# Patient Record
Sex: Female | Born: 1960 | Race: White | Hispanic: No | Smoking: Former smoker
Health system: Western US, Academic
[De-identification: ages and names within clinical notes are randomized; demographics above are authoritative.]

## PROBLEM LIST (undated history)

## (undated) ENCOUNTER — Ambulatory Visit (HOSPITAL_BASED_OUTPATIENT_CLINIC_OR_DEPARTMENT_OTHER): Admission: RE | Payer: Medicare Other | Admitting: Gastroenterology

## (undated) ENCOUNTER — Encounter (HOSPITAL_BASED_OUTPATIENT_CLINIC_OR_DEPARTMENT_OTHER): Admission: RE | Payer: Self-pay

## (undated) DIAGNOSIS — F17210 Nicotine dependence, cigarettes, uncomplicated: Secondary | ICD-10-CM

## (undated) DIAGNOSIS — E782 Mixed hyperlipidemia: Secondary | ICD-10-CM

## (undated) DIAGNOSIS — M1990 Unspecified osteoarthritis, unspecified site: Secondary | ICD-10-CM

## (undated) DIAGNOSIS — H269 Unspecified cataract: Secondary | ICD-10-CM

## (undated) DIAGNOSIS — J45909 Unspecified asthma, uncomplicated: Secondary | ICD-10-CM

## (undated) DIAGNOSIS — G5601 Carpal tunnel syndrome, right upper limb: Secondary | ICD-10-CM

## (undated) DIAGNOSIS — T7840XA Allergy, unspecified, initial encounter: Secondary | ICD-10-CM

## (undated) DIAGNOSIS — J449 Chronic obstructive pulmonary disease, unspecified: Secondary | ICD-10-CM

## (undated) DIAGNOSIS — J439 Emphysema, unspecified: Secondary | ICD-10-CM

## (undated) DIAGNOSIS — I10 Essential (primary) hypertension: Secondary | ICD-10-CM

## (undated) DIAGNOSIS — M810 Age-related osteoporosis without current pathological fracture: Secondary | ICD-10-CM

## (undated) DIAGNOSIS — F321 Major depressive disorder, single episode, moderate: Secondary | ICD-10-CM

## (undated) DIAGNOSIS — G35 Multiple sclerosis: Secondary | ICD-10-CM

## (undated) DIAGNOSIS — G35D Multiple sclerosis, unspecified: Secondary | ICD-10-CM

## (undated) DIAGNOSIS — E039 Hypothyroidism, unspecified: Secondary | ICD-10-CM

## (undated) DIAGNOSIS — F329 Major depressive disorder, single episode, unspecified: Secondary | ICD-10-CM

## (undated) HISTORY — PX: TONSILLECTOMY AND ADENOIDECTOMY: SHX2683

## (undated) HISTORY — DX: Multiple sclerosis, unspecified: G35.D

## (undated) HISTORY — DX: Major depressive disorder, single episode, unspecified: F32.9

## (undated) HISTORY — PX: LYMPH NODE BIOPSY: SHX201

## (undated) HISTORY — PX: OTHER PROCEDURE: U1053

## (undated) HISTORY — DX: Multiple sclerosis (CMS-HCC): G35

## (undated) HISTORY — DX: Unspecified asthma, uncomplicated: J45.909

## (undated) HISTORY — DX: Hypothyroidism, unspecified: E03.9

## (undated) HISTORY — DX: Age-related osteoporosis without current pathological fracture: M81.0

## (undated) HISTORY — DX: Unspecified cataract: H26.9

## (undated) HISTORY — DX: Nicotine dependence, cigarettes, uncomplicated: F17.210

## (undated) HISTORY — DX: Essential (primary) hypertension: I10

## (undated) HISTORY — DX: Unspecified osteoarthritis, unspecified site: M19.90

## (undated) HISTORY — DX: Carpal tunnel syndrome, right upper limb: G56.01

## (undated) HISTORY — DX: Emphysema, unspecified: J43.9

## (undated) HISTORY — PX: ABDOMINAL HYSTERECTOMY: SHX81

## (undated) HISTORY — DX: Chronic obstructive pulmonary disease, unspecified: J44.9

## (undated) HISTORY — PX: CHOLECYSTECTOMY: SHX55

## (undated) HISTORY — DX: Major depressive disorder, single episode, moderate: F32.1

## (undated) HISTORY — DX: Mixed hyperlipidemia: E78.2

## (undated) HISTORY — DX: Allergy, unspecified, initial encounter: T78.40XA

## (undated) SURGERY — COLONOSCOPY
Anesthesia: Monitored Anesthesia Care (MAC)

## (undated) MED ORDER — SODIUM CHLORIDE 0.9 % IV SOLN
500.00 mg | Freq: Once | INTRAVENOUS | Status: AC
Start: 2018-01-02 — End: ?

## (undated) MED ORDER — MEPERIDINE HCL 25 MG/ML IJ SOLN
25.00 mg | Freq: Once | INTRAMUSCULAR | Status: AC | PRN
Start: 2020-05-21 — End: ?

## (undated) MED ORDER — HYDROCORTISONE SOD SUCCINATE 100 MG IJ SOLR
100.00 mg | Freq: Once | INTRAMUSCULAR | Status: AC | PRN
Start: 2017-03-16 — End: 2017-03-10

## (undated) MED ORDER — METHYLPREDNISOLONE SODIUM SUCC 125 MG IJ SOLR
100.00 mg | Freq: Once | INTRAMUSCULAR | Status: AC
Start: 2018-01-16 — End: ?

## (undated) MED ORDER — EPINEPHRINE 0.3 MG/0.3ML IJ SOAJ
0.30 mg | INTRAMUSCULAR | Status: AC | PRN
Start: 2016-01-02 — End: 2015-12-15

## (undated) MED ORDER — HYDROCORTISONE SOD SUCCINATE 100 MG IJ SOLR
100.00 mg | Freq: Once | INTRAMUSCULAR | Status: AC | PRN
Start: 2016-06-22 — End: 2016-06-24

## (undated) MED ORDER — EPINEPHRINE 0.3 MG/0.3ML IJ SOAJ
0.30 mg | INTRAMUSCULAR | Status: AC | PRN
Start: 2016-08-23 — End: 2016-08-23

## (undated) MED ORDER — ARMOUR THYROID 15 MG OR TABS
ORAL_TABLET | ORAL | 0 refills | Status: AC
Start: 2018-10-07 — End: ?

## (undated) MED ORDER — METHYLPREDNISOLONE SODIUM SUCC 125 MG IJ SOLR
100.00 mg | Freq: Once | INTRAMUSCULAR | Status: AC
Start: 2020-05-21 — End: 2020-04-22

## (undated) MED ORDER — DIPHENHYDRAMINE HCL 50 MG/ML IJ SOLN
25.00 mg | Freq: Once | INTRAMUSCULAR | Status: AC | PRN
Start: 2016-03-01 — End: 2016-02-09

## (undated) MED ORDER — ALBUTEROL SULFATE (5 MG/ML) 0.5% IN NEBU
2.50 mg | INHALATION_SOLUTION | Freq: Once | RESPIRATORY_TRACT | Status: AC | PRN
Start: 2016-01-30 — End: 2016-01-12

## (undated) MED ORDER — SODIUM CHLORIDE 0.9 % IV SOLN
INTRAVENOUS | Status: AC
Start: 2016-08-23 — End: 2016-08-23

## (undated) MED ORDER — SODIUM CHLORIDE 0.9 % IV SOLN
INTRAVENOUS | Status: AC
Start: 2016-01-30 — End: 2016-01-12

## (undated) MED ORDER — HYDROCORTISONE SOD SUCCINATE 100 MG IJ SOLR
100.00 mg | Freq: Once | INTRAMUSCULAR | Status: AC | PRN
Start: 2018-01-16 — End: ?

## (undated) MED ORDER — METHYLPREDNISOLONE SODIUM SUCC 125 MG IJ SOLR
100.00 mg | Freq: Once | INTRAMUSCULAR | Status: AC
Start: 2018-09-18 — End: ?

## (undated) MED ORDER — DIPHENHYDRAMINE HCL 50 MG/ML IJ SOLN
25.00 mg | Freq: Once | INTRAMUSCULAR | Status: AC | PRN
Start: 2018-01-16 — End: ?

## (undated) MED ORDER — ACETAMINOPHEN 325 MG PO TABS
650.00 mg | ORAL_TABLET | Freq: Once | ORAL | Status: AC
Start: 2017-07-15 — End: 2017-07-15

## (undated) MED ORDER — ALBUTEROL SULFATE (5 MG/ML) 0.5% IN NEBU
2.50 mg | INHALATION_SOLUTION | Freq: Once | RESPIRATORY_TRACT | Status: AC | PRN
Start: 2016-10-29 — End: 2016-10-18

## (undated) MED ORDER — SODIUM CHLORIDE 0.9 % IV SOLN
1000.0000 mg | Freq: Once | INTRAVENOUS | Status: AC
Start: 2020-12-12 — End: 2020-12-16

## (undated) MED ORDER — HYDROCORTISONE SOD SUCCINATE 100 MG IJ SOLR
100.00 mg | Freq: Once | INTRAMUSCULAR | Status: AC | PRN
Start: 2017-02-10 — End: 2017-01-21

## (undated) MED ORDER — NATALIZUMAB 300 MG/15ML IV CONC
300.00 mg | Freq: Once | INTRAVENOUS | Status: AC
Start: 2017-02-10 — End: 2017-01-21

## (undated) MED ORDER — DIPHENHYDRAMINE HCL 50 MG/ML IJ SOLN
25.00 mg | Freq: Once | INTRAMUSCULAR | Status: AC | PRN
Start: 2016-07-25 — End: 2016-07-25

## (undated) MED ORDER — EPINEPHRINE 0.3 MG/0.3ML IJ SOAJ
0.30 mg | INTRAMUSCULAR | Status: AC | PRN
Start: 2017-04-29 — End: 2017-04-07

## (undated) MED ORDER — ALBUTEROL SULFATE (5 MG/ML) 0.5% IN NEBU
2.50 mg | INHALATION_SOLUTION | Freq: Once | RESPIRATORY_TRACT | Status: AC | PRN
Start: 2016-03-01 — End: 2016-02-09

## (undated) MED ORDER — SODIUM CHLORIDE 0.9 % IV SOLN
INTRAVENOUS | Status: AC
Start: 2016-03-01 — End: 2016-02-09

## (undated) MED ORDER — ACETAMINOPHEN 325 MG PO TABS
650.0000 mg | ORAL_TABLET | Freq: Once | ORAL | Status: AC
Start: 2019-08-10 — End: 2019-08-10

## (undated) MED ORDER — SODIUM CHLORIDE 0.9 % IV SOLN
INTRAVENOUS | Status: AC
Start: 2016-07-20 — End: 2016-07-20

## (undated) MED ORDER — LACTATED RINGERS IV SOLN
INTRAVENOUS | Status: AC
Start: 2020-10-15 — End: ?

## (undated) MED ORDER — LIOTHYRONINE SODIUM 25 MCG OR TABS
ORAL_TABLET | ORAL | 0 refills | Status: AC
Start: 2020-04-29 — End: ?

## (undated) MED ORDER — MEPERIDINE HCL 25 MG/ML IJ SOLN
25.00 mg | Freq: Once | INTRAMUSCULAR | Status: AC | PRN
Start: 2018-01-02 — End: ?

## (undated) MED ORDER — SODIUM CHLORIDE 0.9 % IV SOLN
INTRAVENOUS | Status: AC
Start: 2016-10-01 — End: 2016-09-20

## (undated) MED ORDER — HYDROCORTISONE SOD SUCCINATE 100 MG IJ SOLR
100.00 mg | Freq: Once | INTRAMUSCULAR | Status: AC | PRN
Start: 2021-06-30 — End: 2021-06-29

## (undated) MED ORDER — DIPHENHYDRAMINE HCL 50 MG/ML IJ SOLN
25.00 mg | Freq: Once | INTRAMUSCULAR | Status: AC | PRN
Start: 2016-10-29 — End: 2016-10-18

## (undated) MED ORDER — RITUXIMAB 500 MG/50ML IV SOLN
1000.00 mg | Freq: Once | INTRAVENOUS | Status: AC
Start: 2018-09-18 — End: ?

## (undated) MED ORDER — EPINEPHRINE 0.3 MG/0.3ML IJ SOAJ
0.30 mg | INTRAMUSCULAR | Status: AC | PRN
Start: 2016-07-20 — End: 2016-07-20

## (undated) MED ORDER — DIPHENHYDRAMINE HCL 50 MG/ML IJ SOLN
25.00 mg | Freq: Once | INTRAMUSCULAR | Status: AC
Start: 2018-09-18 — End: ?

## (undated) MED ORDER — SODIUM CHLORIDE 0.9 % IV SOLN
INTRAVENOUS | Status: AC
Start: 2017-07-15 — End: 2017-07-15

## (undated) MED ORDER — ARMOUR THYROID 15 MG OR TABS
ORAL_TABLET | ORAL | 1 refills | Status: AC
Start: 2017-07-21 — End: ?

## (undated) MED ORDER — EPINEPHRINE 0.3 MG/0.3ML IJ SOAJ
0.30 mg | INTRAMUSCULAR | Status: AC | PRN
Start: 2017-05-25 — End: 2017-05-05

## (undated) MED ORDER — SODIUM CHLORIDE 0.9 % IV SOLN
INTRAVENOUS | Status: AC
Start: 2017-02-10 — End: 2017-01-21

## (undated) MED ORDER — EPINEPHRINE 0.3 MG/0.3ML IJ SOAJ
0.30 mg | INTRAMUSCULAR | Status: AC | PRN
Start: 2016-05-27 — End: 2016-05-03

## (undated) MED ORDER — NATALIZUMAB 300 MG/15ML IV CONC
300.00 mg | Freq: Once | INTRAVENOUS | Status: AC
Start: 2016-10-01 — End: 2016-09-21

## (undated) MED ORDER — DIPHENHYDRAMINE HCL 50 MG/ML IJ SOLN
25.00 mg | Freq: Once | INTRAMUSCULAR | Status: AC
Start: 2018-01-02 — End: ?

## (undated) MED ORDER — PRAZOSIN HCL 1 MG OR CAPS
1.0000 mg | ORAL_CAPSULE | Freq: Every evening | ORAL | 0 refills | Status: AC
Start: 2021-06-01 — End: ?

## (undated) MED ORDER — HYDROCORTISONE SOD SUCCINATE 100 MG IJ SOLR
100.00 mg | Freq: Once | INTRAMUSCULAR | Status: AC | PRN
Start: 2016-01-30 — End: 2016-01-12

## (undated) MED ORDER — ALBUTEROL SULFATE (5 MG/ML) 0.5% IN NEBU
2.50 mg | INHALATION_SOLUTION | Freq: Once | RESPIRATORY_TRACT | Status: AC | PRN
Start: 2018-01-16 — End: ?

## (undated) MED ORDER — MEPERIDINE HCL 25 MG/ML IJ SOLN
25.00 mg | Freq: Once | INTRAMUSCULAR | Status: AC | PRN
Start: 2017-07-01 — End: ?

## (undated) MED ORDER — ALBUTEROL SULFATE (5 MG/ML) 0.5% IN NEBU
2.50 mg | INHALATION_SOLUTION | Freq: Once | RESPIRATORY_TRACT | Status: AC | PRN
Start: 2019-08-10 — End: 2019-08-10

## (undated) MED ORDER — ALBUTEROL SULFATE (5 MG/ML) 0.5% IN NEBU
2.5000 mg | INHALATION_SOLUTION | Freq: Once | RESPIRATORY_TRACT | Status: AC | PRN
Start: 2020-12-12 — End: 2020-12-15

## (undated) MED ORDER — NATALIZUMAB 300 MG/15ML IV CONC
300.00 mg | Freq: Once | INTRAVENOUS | Status: AC
Start: 2016-12-24 — End: 2016-12-25

## (undated) MED ORDER — SODIUM CHLORIDE 0.9 % IV SOLN
INTRAVENOUS | Status: AC
Start: 2016-06-22 — End: 2016-06-24

## (undated) MED ORDER — EPINEPHRINE 0.3 MG/0.3ML IJ SOAJ
0.30 mg | INTRAMUSCULAR | Status: AC | PRN
Start: 2016-12-24 — End: 2016-12-24

## (undated) MED ORDER — NORTRIPTYLINE HCL 25 MG OR CAPS
50.0000 mg | ORAL_CAPSULE | Freq: Every evening | ORAL | 3 refills | Status: AC
Start: 2021-06-10 — End: ?

## (undated) MED ORDER — ALBUTEROL SULFATE (5 MG/ML) 0.5% IN NEBU
2.50 mg | INHALATION_SOLUTION | Freq: Once | RESPIRATORY_TRACT | Status: AC | PRN
Start: 2016-05-27 — End: 2016-05-03

## (undated) MED ORDER — ALBUTEROL SULFATE (5 MG/ML) 0.5% IN NEBU
2.50 mg | INHALATION_SOLUTION | Freq: Once | RESPIRATORY_TRACT | Status: AC | PRN
Start: 2021-06-30 — End: 2021-06-29

## (undated) MED ORDER — HYDROCORTISONE SOD SUCCINATE 100 MG IJ SOLR
100.00 mg | Freq: Once | INTRAMUSCULAR | Status: AC | PRN
Start: 2016-03-26 — End: 2016-03-08

## (undated) MED ORDER — SODIUM CHLORIDE 0.9 % IV SOLN
INTRAVENOUS | Status: AC
Start: 2019-08-10 — End: 2019-08-10

## (undated) MED ORDER — SODIUM CHLORIDE 0.9 % IV SOLN
INTRAVENOUS | Status: AC
Start: 2016-12-24 — End: ?

## (undated) MED ORDER — ACETAMINOPHEN 325 MG PO TABS
325.00 mg | ORAL_TABLET | Freq: Once | ORAL | Status: AC
Start: 2018-01-02 — End: ?

## (undated) MED ORDER — HYDROCORTISONE SOD SUCCINATE 100 MG IJ SOLR
100.00 mg | Freq: Once | INTRAMUSCULAR | Status: AC | PRN
Start: 2017-07-15 — End: 2017-07-15

## (undated) MED ORDER — METHYLPREDNISOLONE SODIUM SUCC 125 MG IJ SOLR
100.00 mg | Freq: Once | INTRAMUSCULAR | Status: AC
Start: 2017-07-01 — End: 2017-07-01

## (undated) MED ORDER — HYDROCORTISONE SOD SUCCINATE 100 MG IJ SOLR (CUSTOM)
100.00 mg | Freq: Once | INTRAMUSCULAR | Status: AC | PRN
Start: 2017-08-23 — End: 2017-08-17

## (undated) MED ORDER — HYDROCORTISONE SOD SUCCINATE 100 MG IJ SOLR
100.00 mg | Freq: Once | INTRAMUSCULAR | Status: AC | PRN
Start: 2016-11-26 — End: 2016-11-26

## (undated) MED ORDER — ARMOUR THYROID 60 MG OR TABS
ORAL_TABLET | ORAL | 1 refills | Status: AC
Start: 2017-07-21 — End: ?

## (undated) MED ORDER — SODIUM CHLORIDE 0.9 % IV SOLN
500.00 mg | Freq: Once | INTRAVENOUS | Status: AC
Start: 2017-07-15 — End: 2017-07-15

## (undated) MED ORDER — NATALIZUMAB 300 MG/15ML IV CONC
300.00 mg | Freq: Once | INTRAVENOUS | Status: AC
Start: 2017-03-16 — End: 2017-03-11

## (undated) MED ORDER — ACETAMINOPHEN 325 MG PO TABS
650.00 mg | ORAL_TABLET | Freq: Once | ORAL | Status: AC
Start: 2021-06-30 — End: 2021-06-29

## (undated) MED ORDER — DIPHENHYDRAMINE HCL 50 MG/ML IJ SOLN
25.00 mg | Freq: Once | INTRAMUSCULAR | Status: AC | PRN
Start: 2016-12-24 — End: 2016-12-24

## (undated) MED ORDER — DIPHENHYDRAMINE HCL 50 MG/ML IJ SOLN
25.00 mg | Freq: Once | INTRAMUSCULAR | Status: AC | PRN
Start: 2016-07-20 — End: 2016-07-20

## (undated) MED ORDER — MEPERIDINE HCL 25 MG/ML IJ SOLN
25.00 mg | Freq: Once | INTRAMUSCULAR | Status: AC | PRN
Start: 2019-08-10 — End: ?

## (undated) MED ORDER — EPINEPHRINE 0.3 MG/0.3ML IJ SOAJ
.30 mg | INTRAMUSCULAR | Status: AC | PRN
Start: 2017-07-15 — End: 2017-07-15

## (undated) MED ORDER — SODIUM CHLORIDE 0.9 % IV SOLN
INTRAVENOUS | Status: AC
Start: 2017-03-16 — End: 2017-03-10

## (undated) MED ORDER — DIPHENHYDRAMINE HCL 50 MG/ML IJ SOLN
25.00 mg | Freq: Once | INTRAMUSCULAR | Status: AC | PRN
Start: 2017-08-23 — End: 2017-08-17

## (undated) MED ORDER — HYDROCORTISONE SOD SUCCINATE 100 MG IJ SOLR
100.00 mg | Freq: Once | INTRAMUSCULAR | Status: AC | PRN
Start: 2016-07-20 — End: 2016-07-20

## (undated) MED ORDER — HYDROCORTISONE SOD SUCCINATE 100 MG IJ SOLR
100.00 mg | Freq: Once | INTRAMUSCULAR | Status: AC | PRN
Start: 2016-12-24 — End: 2016-12-24

## (undated) MED ORDER — METHYLPREDNISOLONE SODIUM SUCC 125 MG IJ SOLR
100.00 mg | Freq: Once | INTRAMUSCULAR | Status: AC
Start: 2021-06-30 — End: 2021-06-29

## (undated) MED ORDER — HYDROCORTISONE SOD SUCCINATE 100 MG IJ SOLR
100.00 mg | Freq: Once | INTRAMUSCULAR | Status: AC | PRN
Start: 2017-07-01 — End: 2017-07-01

## (undated) MED ORDER — ALBUTEROL SULFATE (5 MG/ML) 0.5% IN NEBU
2.50 mg | INHALATION_SOLUTION | Freq: Once | RESPIRATORY_TRACT | Status: AC | PRN
Start: 2016-06-22 — End: 2016-06-24

## (undated) MED ORDER — EPINEPHRINE 0.3 MG/0.3ML IJ SOAJ
0.30 mg | INTRAMUSCULAR | Status: AC | PRN
Start: 2017-06-28 — End: 2017-06-22

## (undated) MED ORDER — DIPHENHYDRAMINE HCL 50 MG/ML IJ SOLN
25.00 mg | Freq: Once | INTRAMUSCULAR | Status: AC | PRN
Start: 2016-03-26 — End: 2016-03-08

## (undated) MED ORDER — EPINEPHRINE 0.3 MG/0.3ML IJ SOAJ
0.30 mg | INTRAMUSCULAR | Status: AC | PRN
Start: 2018-01-16 — End: ?

## (undated) MED ORDER — ACETAMINOPHEN 325 MG PO TABS
650.0000 mg | ORAL_TABLET | Freq: Once | ORAL | Status: AC
Start: 2020-12-12 — End: 2020-12-16

## (undated) MED ORDER — HYDROCORTISONE SOD SUCCINATE 100 MG IJ SOLR
100.00 mg | Freq: Once | INTRAMUSCULAR | Status: AC | PRN
Start: 2018-09-18 — End: ?

## (undated) MED ORDER — ALBUTEROL SULFATE (5 MG/ML) 0.5% IN NEBU
2.50 mg | INHALATION_SOLUTION | Freq: Once | RESPIRATORY_TRACT | Status: AC | PRN
Start: 2016-10-01 — End: 2016-09-20

## (undated) MED ORDER — DIPHENHYDRAMINE HCL 50 MG/ML IJ SOLN
25.00 mg | Freq: Once | INTRAMUSCULAR | Status: AC
Start: 2017-07-15 — End: 2017-07-15

## (undated) MED ORDER — DIPHENHYDRAMINE HCL 50 MG/ML IJ SOLN
25.00 mg | Freq: Once | INTRAMUSCULAR | Status: AC | PRN
Start: 2016-10-01 — End: 2016-09-20

## (undated) MED ORDER — DIAZEPAM 5 MG OR TABS
5.00 mg | ORAL_TABLET | ORAL | Status: AC
Start: 2017-01-24 — End: ?

## (undated) MED ORDER — DIPHENHYDRAMINE HCL 50 MG/ML IJ SOLN
25.00 mg | Freq: Once | INTRAMUSCULAR | Status: AC | PRN
Start: 2019-08-10 — End: 2019-08-10

## (undated) MED ORDER — HYDROCORTISONE SOD SUCCINATE 100 MG IJ SOLR
100.00 mg | Freq: Once | INTRAMUSCULAR | Status: AC | PRN
Start: 2016-07-25 — End: 2016-07-25

## (undated) MED ORDER — EPINEPHRINE 0.3 MG/0.3ML IJ SOAJ
0.30 mg | INTRAMUSCULAR | Status: AC | PRN
Start: 2019-08-10 — End: 2019-08-10

## (undated) MED ORDER — METHYLPREDNISOLONE SODIUM SUCC 125 MG IJ SOLR CUSTOM
100.0000 mg | Freq: Once | INTRAMUSCULAR | Status: AC
Start: 2020-12-12 — End: 2020-12-16

## (undated) MED ORDER — SODIUM CHLORIDE 0.9 % IV SOLN
INTRAVENOUS | Status: AC
Start: 2020-05-21 — End: 2020-04-21

## (undated) MED ORDER — DIPHENHYDRAMINE HCL 50 MG/ML IJ SOLN
25.00 mg | Freq: Once | INTRAMUSCULAR | Status: AC | PRN
Start: 2017-04-29 — End: 2017-04-07

## (undated) MED ORDER — SODIUM CHLORIDE 0.9 % IV SOLN
300.00 mg | Freq: Once | INTRAVENOUS | Status: AC
Start: 2016-01-02 — End: 2015-12-15

## (undated) MED ORDER — EPINEPHRINE 0.3 MG/0.3ML IJ SOAJ
0.30 mg | INTRAMUSCULAR | Status: AC | PRN
Start: 2018-09-18 — End: ?

## (undated) MED ORDER — DIPHENHYDRAMINE HCL 50 MG/ML IJ SOLN
25.00 mg | Freq: Once | INTRAMUSCULAR | Status: AC | PRN
Start: 2017-07-15 — End: 2017-07-15

## (undated) MED ORDER — EPINEPHRINE 0.3 MG/0.3ML IJ SOAJ
0.30 mg | INTRAMUSCULAR | Status: AC | PRN
Start: 2020-05-21 — End: 2020-04-21

## (undated) MED ORDER — SODIUM CHLORIDE 0.9 % IV SOLN
300.00 mg | Freq: Once | INTRAVENOUS | Status: AC
Start: 2017-06-28 — End: 2017-06-23

## (undated) MED ORDER — SODIUM CHLORIDE 0.9 % IV SOLN
300.00 mg | Freq: Once | INTRAVENOUS | Status: AC
Start: 2017-05-25 — End: 2017-05-06

## (undated) MED ORDER — SODIUM CHLORIDE 0.9 % IV SOLN
INTRAVENOUS | Status: AC
Start: 2016-11-26 — End: ?

## (undated) MED ORDER — ACETAMINOPHEN 325 MG PO TABS
325.00 mg | ORAL_TABLET | Freq: Once | ORAL | Status: AC
Start: 2018-09-18 — End: ?

## (undated) MED ORDER — DIPHENHYDRAMINE HCL 50 MG/ML IJ SOLN
25.00 mg | Freq: Once | INTRAMUSCULAR | Status: AC
Start: 2021-06-30 — End: 2021-06-29

## (undated) MED ORDER — BUPROPION XL (DAILY) 300 MG OR TB24
300.0000 mg | ORAL_TABLET | Freq: Every morning | ORAL | 1 refills | Status: AC
Start: 2020-12-13 — End: ?

## (undated) MED ORDER — EPINEPHRINE 0.3 MG/0.3ML IJ SOAJ
0.30 mg | INTRAMUSCULAR | Status: AC | PRN
Start: 2016-01-30 — End: 2016-01-12

## (undated) MED ORDER — ACETAMINOPHEN 325 MG PO TABS
325.00 mg | ORAL_TABLET | Freq: Once | ORAL | Status: AC | PRN
Start: 2021-06-30 — End: ?

## (undated) MED ORDER — HYDROCORTISONE SOD SUCCINATE 100 MG IJ SOLR
100.00 mg | Freq: Once | INTRAMUSCULAR | Status: AC | PRN
Start: 2016-10-01 — End: 2016-09-20

## (undated) MED ORDER — DIPHENHYDRAMINE HCL 50 MG/ML IJ SOLN
25.00 mg | Freq: Once | INTRAMUSCULAR | Status: AC | PRN
Start: 2016-01-30 — End: 2016-01-12

## (undated) MED ORDER — THYROID 15 MG OR TABS
15.0000 mg | ORAL_TABLET | ORAL | 2 refills | Status: AC
Start: 2021-03-09 — End: ?

## (undated) MED ORDER — METHYLPREDNISOLONE SODIUM SUCC 125 MG IJ SOLR
100.00 mg | Freq: Once | INTRAMUSCULAR | Status: AC
Start: 2017-07-15 — End: 2017-07-15

## (undated) MED ORDER — ACETAMINOPHEN 325 MG PO TABS
325.00 mg | ORAL_TABLET | Freq: Once | ORAL | Status: AC | PRN
Start: 2018-09-18 — End: ?

## (undated) MED ORDER — ALBUTEROL SULFATE (5 MG/ML) 0.5% IN NEBU
2.50 mg | INHALATION_SOLUTION | Freq: Once | RESPIRATORY_TRACT | Status: AC | PRN
Start: 2016-12-24 — End: 2016-12-24

## (undated) MED ORDER — SODIUM CHLORIDE 0.9 % IV SOLN
INTRAVENOUS | Status: AC
Start: 2020-12-12 — End: 2020-12-16

## (undated) MED ORDER — SODIUM CHLORIDE 0.9 % IV SOLN
INTRAVENOUS | Status: AC
Start: 2017-06-28 — End: ?

## (undated) MED ORDER — ALBUTEROL SULFATE (5 MG/ML) 0.5% IN NEBU
2.50 mg | INHALATION_SOLUTION | Freq: Once | RESPIRATORY_TRACT | Status: AC | PRN
Start: 2016-04-23 — End: 2016-04-05

## (undated) MED ORDER — BACLOFEN 10 MG OR TABS
10.00 mg | ORAL_TABLET | Freq: Every day | ORAL | 0 refills | Status: AC
Start: 2017-08-22 — End: ?

## (undated) MED ORDER — SODIUM CHLORIDE 0.9 % IV SOLN
INTRAVENOUS | Status: AC
Start: 2016-07-25 — End: 2016-07-25

## (undated) MED ORDER — MEPERIDINE HCL 25 MG/ML IJ SOLN
25.00 mg | Freq: Once | INTRAMUSCULAR | Status: AC | PRN
Start: 2017-07-15 — End: ?

## (undated) MED ORDER — DIPHENHYDRAMINE HCL 50 MG/ML IJ SOLN
25.00 mg | Freq: Once | INTRAMUSCULAR | Status: AC | PRN
Start: 2020-05-21 — End: 2020-04-21

## (undated) MED ORDER — SODIUM CHLORIDE 0.9 % IV SOLN
INTRAVENOUS | Status: AC
Start: 2016-03-26 — End: 2016-03-08

## (undated) MED ORDER — HYDROCORTISONE SOD SUCCINATE 100 MG IJ SOLR
100.00 mg | Freq: Once | INTRAMUSCULAR | Status: AC | PRN
Start: 2017-04-29 — End: 2017-04-07

## (undated) MED ORDER — EPINEPHRINE 0.3 MG/0.3ML IJ SOAJ
0.30 mg | INTRAMUSCULAR | Status: AC | PRN
Start: 2016-10-01 — End: 2016-09-20

## (undated) MED ORDER — ALBUTEROL SULFATE (5 MG/ML) 0.5% IN NEBU
2.50 mg | INHALATION_SOLUTION | Freq: Once | RESPIRATORY_TRACT | Status: AC | PRN
Start: 2017-07-15 — End: 2017-07-15

## (undated) MED ORDER — ALBUTEROL SULFATE (5 MG/ML) 0.5% IN NEBU
2.50 mg | INHALATION_SOLUTION | Freq: Once | RESPIRATORY_TRACT | Status: AC | PRN
Start: 2017-05-25 — End: 2017-05-05

## (undated) MED ORDER — DIPHENHYDRAMINE HCL 50 MG/ML IJ SOLN
25.00 mg | Freq: Once | INTRAMUSCULAR | Status: AC | PRN
Start: 2018-09-18 — End: ?

## (undated) MED ORDER — SODIUM CHLORIDE 0.9 % IV SOLN
INTRAVENOUS | Status: AC
Start: 2017-05-25 — End: ?

## (undated) MED ORDER — DIPHENHYDRAMINE HCL 50 MG/ML IJ SOLN
25.00 mg | Freq: Once | INTRAMUSCULAR | Status: AC | PRN
Start: 2016-06-22 — End: 2016-06-24

## (undated) MED ORDER — MEPERIDINE HCL 25 MG/ML IJ SOLN
25.0000 mg | Freq: Once | INTRAMUSCULAR | Status: AC | PRN
Start: 2020-12-12 — End: ?

## (undated) MED ORDER — TIZANIDINE HCL 4 MG OR TABS
4.0000 mg | ORAL_TABLET | Freq: Every evening | ORAL | 1 refills | Status: AC
Start: 2021-03-13 — End: ?

## (undated) MED ORDER — SODIUM CHLORIDE 0.9 % IV SOLN
INTRAVENOUS | Status: AC
Start: 2017-05-25 — End: 2017-05-05

## (undated) MED ORDER — NATALIZUMAB 300 MG/15ML IV CONC
300.00 mg | Freq: Once | INTRAVENOUS | Status: AC
Start: 2016-01-30 — End: 2016-01-12

## (undated) MED ORDER — EPINEPHRINE 0.3 MG/0.3ML IJ SOAJ
0.30 mg | INTRAMUSCULAR | Status: AC | PRN
Start: 2017-07-26 — End: 2017-07-20

## (undated) MED ORDER — SODIUM CHLORIDE 0.9 % IV SOLN
INTRAVENOUS | Status: AC
Start: 2016-12-24 — End: 2016-12-24

## (undated) MED ORDER — METHYLPREDNISOLONE SODIUM SUCC 125 MG IJ SOLR
100.00 mg | Freq: Once | INTRAMUSCULAR | Status: AC
Start: 2018-01-02 — End: ?

## (undated) MED ORDER — ALBUTEROL SULFATE (5 MG/ML) 0.5% IN NEBU
2.50 mg | INHALATION_SOLUTION | Freq: Once | RESPIRATORY_TRACT | Status: AC | PRN
Start: 2016-01-02 — End: 2015-12-15

## (undated) MED ORDER — HYDROCORTISONE SOD SUCCINATE 100 MG IJ SOLR
100.00 mg | Freq: Once | INTRAMUSCULAR | Status: AC | PRN
Start: 2016-05-27 — End: 2016-05-03

## (undated) MED ORDER — ALBUTEROL SULFATE (5 MG/ML) 0.5% IN NEBU
2.50 mg | INHALATION_SOLUTION | Freq: Once | RESPIRATORY_TRACT | Status: AC | PRN
Start: 2016-07-20 — End: 2016-07-20

## (undated) MED ORDER — ACETAMINOPHEN 325 MG PO TABS
325.0000 mg | ORAL_TABLET | Freq: Once | ORAL | Status: AC | PRN
Start: 2019-08-10 — End: ?

## (undated) MED ORDER — DIPHENHYDRAMINE HCL 50 MG/ML IJ SOLN
25.00 mg | Freq: Once | INTRAMUSCULAR | Status: AC | PRN
Start: 2016-11-26 — End: 2016-11-26

## (undated) MED ORDER — DIPHENHYDRAMINE HCL 50 MG/ML IJ SOLN
25.00 mg | Freq: Once | INTRAMUSCULAR | Status: AC | PRN
Start: 2016-05-27 — End: 2016-05-03

## (undated) MED ORDER — DIPHENHYDRAMINE HCL 50 MG/ML IJ SOLN
25.00 mg | Freq: Once | INTRAMUSCULAR | Status: AC | PRN
Start: 2016-04-23 — End: 2016-04-05

## (undated) MED ORDER — EPINEPHRINE 0.3 MG/0.3ML IJ SOAJ
0.30 mg | INTRAMUSCULAR | Status: AC | PRN
Start: 2016-07-25 — End: 2016-07-25

## (undated) MED ORDER — NATALIZUMAB 300 MG/15ML IV CONC
300.00 mg | Freq: Once | INTRAVENOUS | Status: AC
Start: 2016-06-22 — End: 2016-06-25

## (undated) MED ORDER — EPINEPHRINE 0.3 MG/0.3ML IJ SOAJ
0.30 mg | INTRAMUSCULAR | Status: AC | PRN
Start: 2016-11-26 — End: 2016-11-26

## (undated) MED ORDER — ALBUTEROL SULFATE (5 MG/ML) 0.5% IN NEBU
2.50 mg | INHALATION_SOLUTION | Freq: Once | RESPIRATORY_TRACT | Status: AC | PRN
Start: 2017-07-01 — End: 2017-07-01

## (undated) MED ORDER — BUPROPION XL (DAILY) 300 MG OR TB24
300.0000 mg | ORAL_TABLET | Freq: Every morning | ORAL | 1 refills | Status: AC
Start: 2021-01-05 — End: ?

## (undated) MED ORDER — EPINEPHRINE 0.3 MG/0.3ML IJ SOAJ
0.30 mg | INTRAMUSCULAR | Status: AC | PRN
Start: 2016-04-23 — End: 2016-04-05

## (undated) MED ORDER — ALBUTEROL SULFATE (5 MG/ML) 0.5% IN NEBU
2.50 mg | INHALATION_SOLUTION | Freq: Once | RESPIRATORY_TRACT | Status: AC | PRN
Start: 2017-02-10 — End: 2017-01-21

## (undated) MED ORDER — HYDROCORTISONE SOD SUCCINATE 100 MG IJ SOLR
100.00 mg | Freq: Once | INTRAMUSCULAR | Status: AC | PRN
Start: 2016-04-23 — End: 2016-04-05

## (undated) MED ORDER — BACLOFEN 10 MG OR TABS
10.00 mg | ORAL_TABLET | Freq: Every day | ORAL | 0 refills | Status: AC
Start: 2017-01-19 — End: ?

## (undated) MED ORDER — DIPHENHYDRAMINE HCL 50 MG/ML IJ SOLN
25.00 mg | Freq: Once | INTRAMUSCULAR | Status: AC | PRN
Start: 2017-05-25 — End: 2017-05-05

## (undated) MED ORDER — EPINEPHRINE 0.3 MG/0.3ML IJ SOAJ
0.30 mg | INTRAMUSCULAR | Status: AC | PRN
Start: 2018-01-02 — End: ?

## (undated) MED ORDER — HYDROCORTISONE SOD SUCCINATE 100 MG IJ SOLR
100.00 mg | Freq: Once | INTRAMUSCULAR | Status: AC | PRN
Start: 2016-01-02 — End: 2015-12-15

## (undated) MED ORDER — HYDROCORTISONE SOD SUCCINATE 100 MG IJ SOLR
100.00 mg | Freq: Once | INTRAMUSCULAR | Status: AC | PRN
Start: 2019-08-10 — End: 2019-08-10

## (undated) MED ORDER — DIPHENHYDRAMINE HCL 50 MG/ML IJ SOLN
25.0000 mg | Freq: Once | INTRAMUSCULAR | Status: AC
Start: 2019-08-10 — End: 2019-08-10

## (undated) MED ORDER — SODIUM CHLORIDE 0.9 % IV SOLN
INTRAVENOUS | Status: AC
Start: 2017-07-01 — End: 2017-07-01

## (undated) MED ORDER — DIPHENHYDRAMINE HCL 50 MG/ML IJ SOLN
25.00 mg | Freq: Once | INTRAMUSCULAR | Status: AC | PRN
Start: 2018-01-02 — End: ?

## (undated) MED ORDER — SODIUM CHLORIDE 0.9 % IV SOLN
300.00 mg | Freq: Once | INTRAVENOUS | Status: AC
Start: 2016-11-26 — End: 2016-11-27

## (undated) MED ORDER — EPINEPHRINE 0.3 MG/0.3ML IJ SOAJ
0.3000 mg | INTRAMUSCULAR | Status: AC | PRN
Start: 2020-12-12 — End: 2020-12-15

## (undated) MED ORDER — DIPHENHYDRAMINE HCL 50 MG/ML IJ SOLN
25.00 mg | Freq: Once | INTRAMUSCULAR | Status: AC | PRN
Start: 2017-07-01 — End: 2017-07-01

## (undated) MED ORDER — ALBUTEROL SULFATE (5 MG/ML) 0.5% IN NEBU
2.50 mg | INHALATION_SOLUTION | Freq: Once | RESPIRATORY_TRACT | Status: AC | PRN
Start: 2018-01-02 — End: ?

## (undated) MED ORDER — SODIUM CHLORIDE 0.9 % IV SOLN
1000.00 mg | Freq: Once | INTRAVENOUS | Status: AC
Start: 2018-01-16 — End: ?

## (undated) MED ORDER — DIPHENHYDRAMINE HCL 50 MG/ML IJ SOLN
25.00 mg | Freq: Once | INTRAMUSCULAR | Status: AC | PRN
Start: 2016-01-02 — End: 2015-12-15

## (undated) MED ORDER — DULOXETINE HCL 60 MG OR CPEP
ORAL_CAPSULE | ORAL | 8 refills | Status: AC
Start: 2021-06-10 — End: ?

## (undated) MED ORDER — SODIUM CHLORIDE 0.9 % IV SOLN
300.00 mg | Freq: Once | INTRAVENOUS | Status: AC
Start: 2016-10-29 — End: 2016-10-19

## (undated) MED ORDER — ACETAMINOPHEN 325 MG PO TABS
325.00 mg | ORAL_TABLET | Freq: Once | ORAL | Status: AC | PRN
Start: 2020-05-21 — End: ?

## (undated) MED ORDER — ALBUTEROL SULFATE (5 MG/ML) 0.5% IN NEBU
2.50 mg | INHALATION_SOLUTION | Freq: Once | RESPIRATORY_TRACT | Status: AC | PRN
Start: 2017-08-23 — End: 2017-08-17

## (undated) MED ORDER — PRAZOSIN HCL 1 MG OR CAPS
1.0000 mg | ORAL_CAPSULE | Freq: Every evening | ORAL | Status: AC
Start: 2021-03-09 — End: ?

## (undated) MED ORDER — SODIUM CHLORIDE 0.9 % IV SOLN
INTRAVENOUS | Status: AC
Start: 2018-09-18 — End: ?

## (undated) MED ORDER — EPINEPHRINE 0.3 MG/0.3ML IJ SOAJ
.30 mg | INTRAMUSCULAR | Status: AC | PRN
Start: 2017-07-01 — End: 2017-07-01

## (undated) MED ORDER — NATALIZUMAB 300 MG/15ML IV CONC
300.00 mg | Freq: Once | INTRAVENOUS | Status: AC
Start: 2016-04-23 — End: 2016-04-05

## (undated) MED ORDER — SODIUM CHLORIDE 0.9 % IV SOLN
INTRAVENOUS | Status: AC
Start: 2017-08-23 — End: 2017-08-17

## (undated) MED ORDER — ALBUTEROL SULFATE (5 MG/ML) 0.5% IN NEBU
2.50 mg | INHALATION_SOLUTION | Freq: Once | RESPIRATORY_TRACT | Status: AC | PRN
Start: 2017-06-28 — End: 2017-06-22

## (undated) MED ORDER — SODIUM CHLORIDE 0.9 % IV SOLN
INTRAVENOUS | Status: AC
Start: 2016-04-23 — End: 2016-04-05

## (undated) MED ORDER — ARMOUR THYROID 15 MG OR TABS
ORAL_TABLET | ORAL | 0 refills | Status: AC
Start: 2018-10-10 — End: ?

## (undated) MED ORDER — ALBUTEROL SULFATE (5 MG/ML) 0.5% IN NEBU
2.50 mg | INHALATION_SOLUTION | Freq: Once | RESPIRATORY_TRACT | Status: AC | PRN
Start: 2016-03-26 — End: 2016-03-08

## (undated) MED ORDER — SODIUM CHLORIDE 0.9 % IV SOLN
INTRAVENOUS | Status: AC
Start: 2017-07-26 — End: ?

## (undated) MED ORDER — SODIUM CHLORIDE 0.9 % IV SOLN
INTRAVENOUS | Status: AC
Start: 2021-06-30 — End: 2021-06-29

## (undated) MED ORDER — NATALIZUMAB 300 MG/15ML IV CONC
300.00 mg | Freq: Once | INTRAVENOUS | Status: AC
Start: 2016-07-25 — End: 2016-07-26

## (undated) MED ORDER — ALBUTEROL SULFATE (5 MG/ML) 0.5% IN NEBU
2.50 mg | INHALATION_SOLUTION | Freq: Once | RESPIRATORY_TRACT | Status: AC | PRN
Start: 2016-11-26 — End: 2016-11-26

## (undated) MED ORDER — HYDROCORTISONE SOD SUCCINATE 100 MG IJ SOLR
100.00 mg | Freq: Once | INTRAMUSCULAR | Status: AC | PRN
Start: 2018-01-02 — End: ?

## (undated) MED ORDER — EPINEPHRINE 0.3 MG/0.3ML IJ SOAJ
0.30 mg | INTRAMUSCULAR | Status: AC | PRN
Start: 2017-03-16 — End: 2017-03-10

## (undated) MED ORDER — SODIUM CHLORIDE 0.9 % IV SOLN
INTRAVENOUS | Status: AC
Start: 2016-10-29 — End: 2016-10-18

## (undated) MED ORDER — MEPERIDINE HCL 25 MG/ML IJ SOLN
25.00 mg | Freq: Once | INTRAMUSCULAR | Status: AC | PRN
Start: 2018-01-16 — End: ?

## (undated) MED ORDER — ACETAMINOPHEN 325 MG PO TABS
650.00 mg | ORAL_TABLET | Freq: Once | ORAL | Status: AC
Start: 2018-01-02 — End: ?

## (undated) MED ORDER — SODIUM CHLORIDE 0.9 % IV SOLN
INTRAVENOUS | Status: AC
Start: 2018-01-02 — End: ?

## (undated) MED ORDER — DIPHENHYDRAMINE HCL 50 MG/ML IJ SOLN
25.00 mg | Freq: Once | INTRAMUSCULAR | Status: AC | PRN
Start: 2017-06-28 — End: 2017-06-22

## (undated) MED ORDER — SODIUM CHLORIDE 0.9 % IV SOLN
INTRAVENOUS | Status: AC
Start: 2017-06-28 — End: 2017-06-22

## (undated) MED ORDER — HYDROCORTISONE SOD SUCCINATE 100 MG IJ SOLR
100.00 mg | Freq: Once | INTRAMUSCULAR | Status: AC | PRN
Start: 2017-07-26 — End: 2017-07-20

## (undated) MED ORDER — HYDROCORTISONE SOD SUCCINATE 100 MG IJ SOLR
100.00 mg | Freq: Once | INTRAMUSCULAR | Status: AC | PRN
Start: 2016-03-01 — End: 2016-02-09

## (undated) MED ORDER — SODIUM CHLORIDE 0.9 % IV SOLN
1000.00 mg | Freq: Once | INTRAVENOUS | Status: AC
Start: 2019-08-10 — End: 2019-08-10

## (undated) MED ORDER — EPINEPHRINE 0.3 MG/0.3ML IJ SOAJ
0.30 mg | INTRAMUSCULAR | Status: AC | PRN
Start: 2017-02-10 — End: 2017-01-21

## (undated) MED ORDER — SODIUM CHLORIDE 0.9 % IV SOLN
INTRAVENOUS | Status: AC
Start: 2018-01-16 — End: ?

## (undated) MED ORDER — EPINEPHRINE 0.3 MG/0.3ML IJ SOAJ
0.30 mg | INTRAMUSCULAR | Status: AC | PRN
Start: 2016-03-01 — End: 2016-02-09

## (undated) MED ORDER — SODIUM CHLORIDE 0.9 % IV SOLN
INTRAVENOUS | Status: AC
Start: 2017-04-29 — End: 2017-04-07

## (undated) MED ORDER — EPINEPHRINE 0.3 MG/0.3ML IJ SOAJ
0.30 mg | INTRAMUSCULAR | Status: AC | PRN
Start: 2016-10-29 — End: 2016-10-18

## (undated) MED ORDER — EPINEPHRINE 0.3 MG/0.3ML IJ SOAJ
0.30 mg | INTRAMUSCULAR | Status: AC | PRN
Start: 2016-03-26 — End: 2016-03-08

## (undated) MED ORDER — DIPHENHYDRAMINE HCL 50 MG/ML IJ SOLN
25.00 mg | Freq: Once | INTRAMUSCULAR | Status: AC
Start: 2017-07-01 — End: 2017-07-01

## (undated) MED ORDER — SODIUM CHLORIDE 0.9 % IV SOLN
INTRAVENOUS | Status: AC
Start: 2016-01-02 — End: 2015-12-15

## (undated) MED ORDER — ALBUTEROL SULFATE (5 MG/ML) 0.5% IN NEBU
2.50 mg | INHALATION_SOLUTION | Freq: Once | RESPIRATORY_TRACT | Status: AC | PRN
Start: 2016-08-23 — End: 2016-08-23

## (undated) MED ORDER — SODIUM CHLORIDE 0.9 % IV SOLN
300.00 mg | Freq: Once | INTRAVENOUS | Status: AC
Start: 2016-08-23 — End: 2016-08-24

## (undated) MED ORDER — SODIUM CHLORIDE 0.9 % IV SOLN
500.00 mg | Freq: Once | INTRAVENOUS | Status: AC
Start: 2018-01-16 — End: ?

## (undated) MED ORDER — ACETAMINOPHEN 325 MG PO TABS
650.00 mg | ORAL_TABLET | Freq: Once | ORAL | Status: AC
Start: 2018-01-16 — End: ?

## (undated) MED ORDER — RITUXIMAB 500 MG/50ML IV SOLN
1000.00 mg | Freq: Once | INTRAVENOUS | Status: AC
Start: 2018-01-02 — End: ?

## (undated) MED ORDER — SODIUM CHLORIDE 0.9 % IV SOLN
INTRAVENOUS | Status: AC
Start: 2017-08-23 — End: ?

## (undated) MED ORDER — EPINEPHRINE 0.3 MG/0.3ML IJ SOAJ
0.30 mg | INTRAMUSCULAR | Status: AC | PRN
Start: 2016-06-22 — End: 2016-06-24

## (undated) MED ORDER — DIPHENHYDRAMINE HCL 50 MG/ML IJ SOLN
25.00 mg | Freq: Once | INTRAMUSCULAR | Status: AC
Start: 2018-01-16 — End: ?

## (undated) MED ORDER — DIPHENHYDRAMINE HCL 50 MG/ML IJ SOLN
25.00 mg | Freq: Once | INTRAMUSCULAR | Status: AC | PRN
Start: 2017-07-26 — End: 2017-07-20

## (undated) MED ORDER — HYDROCORTISONE SOD SUCCINATE 100 MG IJ SOLR
100.00 mg | Freq: Once | INTRAMUSCULAR | Status: AC | PRN
Start: 2017-06-28 — End: 2017-06-22

## (undated) MED ORDER — METHYLPREDNISOLONE SODIUM SUCC 125 MG IJ SOLR CUSTOM
100.0000 mg | Freq: Once | INTRAMUSCULAR | Status: AC
Start: 2019-08-10 — End: 2019-08-10

## (undated) MED ORDER — ARMOUR THYROID 15 MG OR TABS
ORAL_TABLET | ORAL | 1 refills | Status: AC
Start: 2017-07-06 — End: ?

## (undated) MED ORDER — ACETAMINOPHEN 325 MG PO TABS
650.00 mg | ORAL_TABLET | Freq: Once | ORAL | Status: AC
Start: 2018-09-18 — End: ?

## (undated) MED ORDER — HYDROCORTISONE SOD SUCCINATE 100 MG IJ SOLR
100.00 mg | Freq: Once | INTRAMUSCULAR | Status: AC | PRN
Start: 2020-05-21 — End: 2020-04-21

## (undated) MED ORDER — MEPERIDINE HCL 25 MG/ML IJ SOLN
25.00 mg | Freq: Once | INTRAMUSCULAR | Status: AC | PRN
Start: 2021-06-30 — End: ?

## (undated) MED ORDER — DIPHENHYDRAMINE HCL 50 MG/ML IJ SOLN
25.00 mg | Freq: Once | INTRAMUSCULAR | Status: AC | PRN
Start: 2021-06-30 — End: 2021-06-29

## (undated) MED ORDER — DIPHENHYDRAMINE HCL 50 MG/ML IJ SOLN
25.00 mg | Freq: Once | INTRAMUSCULAR | Status: AC
Start: 2020-05-21 — End: 2020-04-22

## (undated) MED ORDER — SODIUM CHLORIDE 0.9 % IV SOLN
INTRAVENOUS | Status: AC
Start: 2020-12-15 — End: 2020-12-15

## (undated) MED ORDER — SODIUM CHLORIDE 0.9 % IV SOLN
300.00 mg | Freq: Once | INTRAVENOUS | Status: AC
Start: 2016-03-01 — End: 2016-02-09

## (undated) MED ORDER — ACETAMINOPHEN 325 MG PO TABS
325.0000 mg | ORAL_TABLET | Freq: Once | ORAL | Status: AC | PRN
Start: 2020-12-12 — End: ?

## (undated) MED ORDER — DULOXETINE HCL 60 MG OR CPEP
120.0000 mg | ORAL_CAPSULE | Freq: Every day | ORAL | 11 refills | Status: AC
Start: 2021-06-10 — End: ?

## (undated) MED ORDER — SODIUM CHLORIDE 0.9 % IV SOLN
1000.00 mg | Freq: Once | INTRAVENOUS | Status: AC
Start: 2020-05-21 — End: 2020-04-22

## (undated) MED ORDER — SODIUM CHLORIDE 0.9 % IV SOLN
INTRAVENOUS | Status: AC
Start: 2021-06-30 — End: 2021-06-30

## (undated) MED ORDER — SODIUM CHLORIDE 0.9 % IV SOLN
INTRAVENOUS | Status: AC
Start: 2017-07-26 — End: 2017-07-20

## (undated) MED ORDER — DIPHENHYDRAMINE HCL 50 MG/ML IJ SOLN
25.0000 mg | Freq: Once | INTRAMUSCULAR | Status: AC
Start: 2020-12-12 — End: 2020-12-16

## (undated) MED ORDER — ACETAMINOPHEN 325 MG PO TABS
650.00 mg | ORAL_TABLET | Freq: Once | ORAL | Status: AC
Start: 2020-05-21 — End: 2020-04-22

## (undated) MED ORDER — SODIUM CHLORIDE 0.9 % IV SOLN
INTRAVENOUS | Status: AC
Start: 2016-11-26 — End: 2016-11-26

## (undated) MED ORDER — MEPERIDINE HCL 25 MG/ML IJ SOLN
25.00 mg | Freq: Once | INTRAMUSCULAR | Status: AC | PRN
Start: 2018-09-18 — End: ?

## (undated) MED ORDER — NATALIZUMAB 300 MG/15ML IV CONC
300.00 mg | Freq: Once | INTRAVENOUS | Status: AC
Start: 2016-03-26 — End: 2016-03-08

## (undated) MED ORDER — DIPHENHYDRAMINE HCL 50 MG/ML IJ SOLN
25.00 mg | Freq: Once | INTRAMUSCULAR | Status: AC | PRN
Start: 2016-08-23 — End: 2016-08-23

## (undated) MED ORDER — SODIUM CHLORIDE 0.9 % IV SOLN
INTRAVENOUS | Status: AC
Start: 2017-04-29 — End: ?

## (undated) MED ORDER — ALBUTEROL SULFATE (5 MG/ML) 0.5% IN NEBU
2.50 mg | INHALATION_SOLUTION | Freq: Once | RESPIRATORY_TRACT | Status: AC | PRN
Start: 2020-05-21 — End: 2020-04-21

## (undated) MED ORDER — DIPHENHYDRAMINE HCL 50 MG/ML IJ SOLN
25.0000 mg | Freq: Once | INTRAMUSCULAR | Status: AC | PRN
Start: 2020-12-12 — End: 2020-12-15

## (undated) MED ORDER — SODIUM CHLORIDE 0.9 % IV SOLN
300.00 mg | Freq: Once | INTRAVENOUS | Status: AC
Start: 2017-08-23 — End: 2017-08-18

## (undated) MED ORDER — ALBUTEROL SULFATE (5 MG/ML) 0.5% IN NEBU
2.50 mg | INHALATION_SOLUTION | Freq: Once | RESPIRATORY_TRACT | Status: AC | PRN
Start: 2017-04-29 — End: 2017-04-07

## (undated) MED ORDER — ARMOUR THYROID 60 MG OR TABS
ORAL_TABLET | ORAL | 0 refills | Status: AC
Start: 2017-08-19 — End: ?

## (undated) MED ORDER — SODIUM CHLORIDE 0.9 % IV SOLN
INTRAVENOUS | Status: AC
Start: 2016-05-27 — End: 2016-05-03

## (undated) MED ORDER — GABAPENTIN 300 MG OR CAPS
600.0000 mg | ORAL_CAPSULE | Freq: Every evening | ORAL | 5 refills | Status: AC
Start: 2021-01-21 — End: ?

## (undated) MED ORDER — ALBUTEROL SULFATE (5 MG/ML) 0.5% IN NEBU
2.50 mg | INHALATION_SOLUTION | Freq: Once | RESPIRATORY_TRACT | Status: AC | PRN
Start: 2017-03-16 — End: 2017-03-10

## (undated) MED ORDER — TIZANIDINE HCL 4 MG OR TABS
ORAL_TABLET | ORAL | 1 refills | Status: AC
Start: 2021-01-05 — End: ?

## (undated) MED ORDER — BUPROPION XL (DAILY) 300 MG OR TB24
ORAL_TABLET | ORAL | 0 refills | Status: AC
Start: 2021-09-22 — End: ?

## (undated) MED ORDER — RITUXIMAB 500 MG/50ML IV SOLN
1000.00 mg | Freq: Once | INTRAVENOUS | Status: AC
Start: 2021-06-30 — End: 2021-06-29

## (undated) MED ORDER — SODIUM CHLORIDE 0.9 % IV SOLN
500.00 mg | Freq: Once | INTRAVENOUS | Status: AC
Start: 2017-07-01 — End: 2017-07-01

## (undated) MED ORDER — DIPHENHYDRAMINE HCL 50 MG/ML IJ SOLN
25.00 mg | Freq: Once | INTRAMUSCULAR | Status: AC | PRN
Start: 2017-02-10 — End: 2017-01-21

## (undated) MED ORDER — EPINEPHRINE 0.3 MG/0.3ML IJ SOAJ
0.30 mg | INTRAMUSCULAR | Status: AC | PRN
Start: 2021-06-30 — End: 2021-06-29

## (undated) MED ORDER — ACETAMINOPHEN 325 MG PO TABS
650.00 mg | ORAL_TABLET | Freq: Once | ORAL | Status: AC
Start: 2017-07-01 — End: 2017-07-01

## (undated) MED ORDER — HYDROCORTISONE SOD SUCCINATE 100 MG IJ SOLR
100.00 mg | Freq: Once | INTRAMUSCULAR | Status: AC | PRN
Start: 2016-08-23 — End: 2016-08-23

## (undated) MED ORDER — HYDROCORTISONE SOD SUCCINATE 100 MG IJ SOLR (CUSTOM)
100.0000 mg | Freq: Once | INTRAMUSCULAR | Status: AC | PRN
Start: 2020-12-12 — End: 2020-12-15

## (undated) MED ORDER — SODIUM CHLORIDE 0.9 % IV SOLN
INTRAVENOUS | Status: AC
Start: 2017-03-16 — End: ?

## (undated) MED ORDER — EPINEPHRINE 0.3 MG/0.3ML IJ SOAJ
.30 mg | INTRAMUSCULAR | Status: AC | PRN
Start: 2017-08-23 — End: 2017-08-17

## (undated) MED ORDER — HYDROCORTISONE SOD SUCCINATE 100 MG IJ SOLR
100.00 mg | Freq: Once | INTRAMUSCULAR | Status: AC | PRN
Start: 2016-10-29 — End: 2016-10-18

## (undated) MED ORDER — ALBUTEROL SULFATE (5 MG/ML) 0.5% IN NEBU
2.50 mg | INHALATION_SOLUTION | Freq: Once | RESPIRATORY_TRACT | Status: AC | PRN
Start: 2016-07-25 — End: 2016-07-25

## (undated) MED ORDER — ALBUTEROL SULFATE (5 MG/ML) 0.5% IN NEBU
2.50 mg | INHALATION_SOLUTION | Freq: Once | RESPIRATORY_TRACT | Status: AC | PRN
Start: 2018-09-18 — End: ?

## (undated) MED ORDER — NATALIZUMAB 300 MG/15ML IV CONC
300.00 mg | Freq: Once | INTRAVENOUS | Status: AC
Start: 2017-04-29 — End: 2017-04-08

## (undated) MED ORDER — HYDROCORTISONE SOD SUCCINATE 100 MG IJ SOLR
100.00 mg | Freq: Once | INTRAMUSCULAR | Status: AC | PRN
Start: 2017-05-25 — End: 2017-05-05

## (undated) MED ORDER — ALBUTEROL SULFATE (5 MG/ML) 0.5% IN NEBU
2.50 mg | INHALATION_SOLUTION | Freq: Once | RESPIRATORY_TRACT | Status: AC | PRN
Start: 2017-07-26 — End: 2017-07-20

## (undated) MED ORDER — ACETAMINOPHEN 325 MG PO TABS
975.0000 mg | ORAL_TABLET | Freq: Once | ORAL | Status: AC
Start: 2020-10-15 — End: 2020-10-15

## (undated) MED ORDER — SODIUM CHLORIDE 0.9 % IV SOLN
300.00 mg | Freq: Once | INTRAVENOUS | Status: AC
Start: 2017-07-26 — End: 2017-07-21

## (undated) MED ORDER — NATALIZUMAB 300 MG/15ML IV CONC
300.00 mg | Freq: Once | INTRAVENOUS | Status: AC
Start: 2016-05-27 — End: 2016-05-03

## (undated) MED ORDER — DIPHENHYDRAMINE HCL 50 MG/ML IJ SOLN
25.00 mg | Freq: Once | INTRAMUSCULAR | Status: AC | PRN
Start: 2017-03-16 — End: 2017-03-10

---

## 1984-04-19 HISTORY — PX: ABDOMINAL HYSTERECTOMY: SHX81

## 1989-04-19 DIAGNOSIS — J45909 Unspecified asthma, uncomplicated: Secondary | ICD-10-CM

## 1989-04-19 HISTORY — DX: Unspecified asthma, uncomplicated: J45.909

## 2001-04-19 HISTORY — PX: CARDIAC SURGERY: SHX584

## 2001-04-19 HISTORY — PX: CORONARY ARTERY BYPASS GRAFT: SHX141

## 2001-06-26 ENCOUNTER — Inpatient Hospital Stay (HOSPITAL_COMMUNITY): Admission: AD | Admit: 2001-06-26 | Discharge: 2001-07-06 | Payer: Self-pay | Admitting: Cardiology

## 2001-06-28 ENCOUNTER — Encounter: Payer: Self-pay | Admitting: Cardiothoracic Surgery

## 2001-06-30 ENCOUNTER — Encounter: Payer: Self-pay | Admitting: Cardiothoracic Surgery

## 2001-07-03 ENCOUNTER — Encounter: Payer: Self-pay | Admitting: Cardiothoracic Surgery

## 2001-07-04 ENCOUNTER — Encounter: Payer: Self-pay | Admitting: Cardiothoracic Surgery

## 2001-07-05 ENCOUNTER — Encounter: Payer: Self-pay | Admitting: Cardiothoracic Surgery

## 2001-07-28 ENCOUNTER — Encounter: Admission: RE | Admit: 2001-07-28 | Discharge: 2001-07-28 | Payer: Self-pay | Admitting: Cardiothoracic Surgery

## 2001-07-28 ENCOUNTER — Encounter: Payer: Self-pay | Admitting: Cardiothoracic Surgery

## 2003-11-11 ENCOUNTER — Inpatient Hospital Stay (HOSPITAL_BASED_OUTPATIENT_CLINIC_OR_DEPARTMENT_OTHER): Admission: RE | Admit: 2003-11-11 | Discharge: 2003-11-11 | Payer: Self-pay | Admitting: Cardiovascular Disease

## 2006-10-14 ENCOUNTER — Ambulatory Visit: Payer: Self-pay | Admitting: *Deleted

## 2010-01-05 LAB — HM COLONOSCOPY

## 2010-09-04 NOTE — Cardiovascular Report (Signed)
NAME:  Dorothy Parker, Dorothy Parker                         ACCOUNT NO.:  [id number]   MEDICAL RECORD NO.:  [id number]                   PATIENT TYPE:  OIB   LOCATION:  6501                                 FACILITY:  [facility]   PHYSICIAN:  Learta Codding, M.D. [facility]             DATE OF BIRTH:  [birth year]   DATE OF PROCEDURE:  11/11/2003  DATE OF DISCHARGE:                              CARDIAC CATHETERIZATION   PROCEDURE PERFORMED:  1. Left heart catheterization with selective coronary angiography.  2. Ventriculography.   DIAGNOSIS:  1. Single-vessel coronary artery disease of the left anterior descending.  2. Patent left internal mammary artery graft to the left anterior     descending.  3. Normal left ventricular systolic function.   INDICATION:  The patient is a 50 year old female patient with history of  heavy tobacco use who has undergone prior single-vessel coronary artery  bypass grafting to the LIMA secondary to proximal LAD lesion.  The patient  now reports atypical chest pain.  She underwent a recent exercise Cardiolite  stress study which demonstrated ST segment depression and questionable  ischemia in the anterior wall versus breast attenuation.  The patient now  has been referred for diagnostic catheterization to further reassess her  coronary anatomy.   DESCRIPTION OF PROCEDURE:  After informed consent was obtained, the patient  was brought to the JV catheterization laboratory.  The right groin was  sterilely prepped draped.  A 5-French arterial sheath was placed using the  modified Seldinger technique.  5-French JL-4 and JR-4 catheters were used  for selective coronary angiography of the left and right coronary system as  well as the IMA graft.  A 5-French pigtail catheter was used for  ventriculography.  At termination of procedure, all catheters and sheaths  were removed and no complications were encountered.   FINDINGS:   HEMODYNAMICS:  Left ventricular pressure 126/4 mmHg,  aortic pressure 126/70  mmHg.  There was no on aortic pullback.   VENTRICULOGRAPHY:  Ejection fraction 65% without segmental wall motion  abnormalities.  No significant mitral regurgitation.   SELECTIVE CORONARY ANGIOGRAPHY:  1. Left main coronary artery:  Large caliber vessel with no evidence of flow-     liming disease.  2. Left anterior descending artery had a smooth 60-80% stenosis in the     ostial segment.  Remainder of the LAD was free of flow-limiting disease     with competitive flow.  3. Circumflex coronary artery was within normal limits.  4. The right coronary artery had no flow-limiting lesions.  5. LIMA graft to the LAD:  The LIMA graft to the LAD was widely patent.  The     anastomosis site showed no significant lesions.  Just beyond the     anastomosis there was a small diagonal branch which did have some ostial     disease of approximately 70%.   RECOMMENDATIONS:  No significant angiographic disease noted  either in the  native vessels or the LIMA graft short of very small diagonal which had an  ostial 70% stenosis.  The patient's chest pain appears atypical and the  diagonal branch appears too small to consider percutaneous coronary  intervention.  Further medical therapy is recommended, but I doubt that this  diagonal vessel is causing the findings on the Cardiolite stress study.                                               Learta Codding, M.D. [facility]    GED/MEDQ  D:  11/11/2003  T:  11/11/2003  Job:  [id number]   cc:   Dr. Judie Grieve at [facility]

## 2010-09-04 NOTE — H&P (Signed)
Dorothy Parker. [facility]  Patient:    Dorothy Parker, Dorothy Parker Visit Number: [id number] MRN: [id number]          Service Type: MED Location: [phone] Attending Physician:  Talitha Givens Dictated by:   Luis Abed, M.D. [facility] Admit Date:  06/26/2001   CC:         Dr. Tomie China in [place]  Dr. Vickii Penna in [place]   History and Physical  HISTORY OF PRESENT ILLNESS:  Dorothy Parker is admitted for the evaluation of chest pain.  She is a heavy smoker.  She had a stress echo done today and had significant chest pain with it.  There was also a definite wall motion abnormality.  Dr. Tomie China was quite concerned and immediately arranged for her to go to the emergency room at [facility] where she was stabilized and then brought to [facility].  The patient did have some rest pain also.  PAST MEDICAL HISTORY:  ALLERGIES:  The patient does not feel well with TOPROL-XL, BIAXIN, and WELLBUTRIN.  MEDICATIONS:  Zestril and Xanax.  OTHER MEDICAL PROBLEMS:  See the list below.  SOCIAL HISTORY:  The patient lives in [place] and has no insurance.  She does work.  She smokes heavily.  She has three or four drinks per week.  FAMILY HISTORY:  There is no documented family history of significant coronary disease.  REVIEW OF SYSTEMS:  The patient is having no significant fevers or chills. She is having no significant HEENT problems.  There are no skin lesions.  She has had chest pain and shortness of breath.  She is having no GU symptoms. She has had some nausea.  The remainder of her review of systems is negative.  PHYSICAL EXAMINATION:  VITAL SIGNS:  Blood pressure 120/64.  She is stable at this time.  HEENT:  Negative.  NECK:  Negative.  There is no lymphadenopathy.  CARDIAC:  Reveals an S1 with an S2.  There are no clicks or significant murmurs.  LUNGS:  Clear.  SKIN:  She has no skin rashes.  ABDOMEN:  Soft.  GENITOURINARY AND RECTAL:   Deferred.  EXTREMITIES:  She has no extremity lesions.  LABORATORY DATA:  EKG reveals some anterior T wave changes.  Hemoglobin 13.5.  There is normal renal function.  Other labs are listed in the chart.  I do not yet have a chest x-ray result.  PROBLEMS: 1. Heavy tobacco use. 2. History of hypertension. 3. Chest pain with an abnormal EKG and a definitely abnormal stress    echocardiogram with changes in the septum and the anterior wall.  The patient is stabilized on medications which include Lovenox and IIb/IIIa inhibitor, along with aspirin.  She is having headaches from nitroglycerin. She will be catheterized tomorrow. Dictated by:   Luis Abed, M.D. [facility] Attending Physician:  Talitha Givens DD:  06/26/01 TD:  06/26/01 Job: [id number]

## 2010-09-04 NOTE — Cardiovascular Report (Signed)
[place]. [facility]  Patient:    Dorothy Parker, Dorothy Parker Visit Number: [id number] MRN: [id number]          Service Type: MED Location: CCUA 2926 01 Attending Physician:  Talitha Givens Dictated by:   Noralyn Pick Eden Emms, M.D. [facility] Admit Date:  06/26/2001   CC:         Aundra Dubin. Revankar, M.D.  Luis Abed, M.D. [facility]   Cardiac Catheterization  PROCEDURE:  Coronary arteriography.  INDICATIONS:  Positive stress echocardiogram with unstable angina.  RESULTS: 1. The left main coronary artery was normal.  2. The ostium of the LAD had a 90-95% hazy stenosis.  The mid and distal LAD    were normal.  The lesion abutted the left main and was not suitable for    intervention.  3. The circumflex coronary artery was nondominant and normal.  4. The right coronary artery was dominant and normal.  RAO ventriculography:  RAO ventriculography revealed mild anteroapical wall hypokinesis with an EF in the 50% range.  There was no gradient across the aortic valve and no MR.  HEMODYNAMICS:  LV pressure was in the range of 128/70.  Aortic pressure was in the range of 128/11.  IMPRESSION:  The patient will need probable off-pump single LIMA to the LAD. The lesion is not amenable to angioplasty since it abuts the left main. Unfortunately, I think the patient did receive 1 dose of Plavix that was written for in the pre-catheterization orders by our P.A.  This may postpone her surgery; however, she is having fairly active chest pain.  Will continue her on Integrilin and CVTS will see the patient today. Dictated by:   Noralyn Pick Eden Emms, M.D. [facility] Attending Physician:  Talitha Givens DD:  06/27/01 TD:  06/27/01 Job: [id number]

## 2010-09-04 NOTE — Consult Note (Signed)
[place]. [facility]  Patient:    Dorothy Parker, Dorothy Parker Visit Number: [id number] MRN: [id number]          Service Type: MED Location: CCUA 2926 01 Attending Physician:  Talitha Givens Dictated by:   Mikey Bussing, M.D. Proc. Date: 06/28/01 Admit Date:  06/26/2001   CC:         CVTS Office  [facility]  Douglass R. Revankar, M.D.   Consultation Report  REFERRING PHYSICIAN: Noralyn Pick. Eden Emms, M.D.  PRIMARY CARE PHYSICIANS: Aundra Dubin. Revankar, M.D. and Dr. Lorin Picket in [place].  REASON FOR CONSULTATION: Chest pain and unstable angina.  CHIEF COMPLAINT: Chest pain.  HISTORY OF PRESENT ILLNESS: I was asked to see this 50 year old, white female, smoker in consultation by Dr. Charlton Haws for evaluation of possible coronary revascularization for recently diagnosed severe coronary artery disease. The patient presented to [facility] in [place] after three to four weeks of exertional intermittent chest pain. She had scheduled an appointment to see Dr. Tomie China for evaluation of her chest pain when she developed severe resting angina described as substernal associated with shortness of breath and nausea, and not associated with sweats. It was not relieved by rest and she was evaluated at [facility] and transferred to [facility]. Her initially ECG showed nonspecific ST segment changes. She is given nitroglycerin, heparin and beta blockade with improvement in her chest pain. Cardiac enzymes were negative. A stress echocardiogram was performed at [facility] which demonstrated inferior and lateral hypokinesia with stress. After transfer to [facility], she was stabilized on heparin, Integrilin and beta blocker. She underwent cardiac catheterization by Dr. Eden Emms, which demonstrated a high-grade 80-90% stenosis at the ostium of the LAD. Her right coronary had no significant disease and her circumflex had no significant disease. Her ejection  fraction was 50% with mild anteroapical hypokinesia and left ventricular end-diastolic pressure was 12 mmHg. Because of the nature of her coronary disease at the LAD ostium, she is felt to be a candidate for surgical coronary revascularization.  Following cardiac catheterization, the patient developed some right lower quadrant pain and tenderness. A consultation by Dr. Madilyn Fireman was performed and a CT scan showed a 6 cm pelvic hematoma from the cardiac catheterization site. All anticoagulants were stopped including heparin, Integrilin, and Plavix. She was monitored the next 12 hours with a stable hematocrit and stable blood pressure. She has not developed any chest pain since her transfer to [facility].  PAST MEDICAL HISTORY: 1. Hypertension. 2. History of smoking.  MEDICATIONS: 1. Zestril 2.5 mg q.d. 2. Xanax 0.5 mg p.o. t.i.d. 3. Aspirin 1 q.d.  ALLERGIES: TOPROL-XL causes ANGIONEUROTIC EDEMA and also possible allergies to [facility] and [facility], which are questionable.  SOCIAL HISTORY: The patient is divorced and lives alone. Her children are grown. 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 She smokes 1-2 packs of cigarettes daily. She uses minimal alcohol.  FAMILY HISTORY: No premature history of coronary artery disease in her family. No history of diabetes.  REVIEW OF SYSTEMS: The patients constitutional symptoms are negative for fever, night sweats or weight loss. ENT review is negative for a change in vision, syncope or vertigo. Pulmonary review is negative for productive cough, hemoptysis, negative for recent episodes of bronchitis. She has been treated with antibiotics for bronchitis in the past two years. No history of abnormal chest x-ray. Cardiac review is positive for angina, dyspnea on exertion, negative for orthopnea, PND, or palpitations. Gastrointestinal review is negative for  blood per rectum, jaundice or hepatitis, recurrent abdominal pain. Positive  for nausea. Genitourinary review is negative for frequency, UTI, hematuria. Skin is reviewed for skin lesion or skin cancer. Endocrine review is negative for diabetes or thyroid disease. Hematologic review is negative for bleeding diaphysis or easy bruising. Musculoskeletal review is negative for fractures or chest injury. Neurologic review is negative for TIA CEA, or seizure. Vascular review is negative for DVT or claudication.  PHYSICAL EXAMINATION:  VITAL SIGNS: She is 5 feet 4 inches and weighs 10 pounds. Blood pressure 110/80. Heart rate 80 per minute, respirations 18. Room air saturation 98%.  GENERAL APPEARANCE: General appearance is that of a middle-aged, white female in her CCU hospital room following cardiac catheterization. She is in no distress.  HEENT: Examination is normocephalic. Full EOMs. Pharynx clear. Dentition under good repair.  NECK: Supple without JVD, thyromegaly, mass, or carotid bruit.  LYMPHPATICS: Reveal no palpable adenopathy of the cervical, supraclavicular, or axillary region.  PULMONARY: Review reveals clear lung fields, slightly diminished breath sounds at the bases.  CARDIAC: Examination is with a regular rate and rhythm without S3 gallop, murmur, or rub.  ABDOMEN: Examination reveals a compression dressing on the right groin. She has slight right lower quadrant tenderness and fullness. Bowel sounds are intact. She has no organomegaly or abdominal bruit.  RECTAL: Examination is deferred.  VASCULAR: Examination reveals 2+ pulses bilaterally in the radial, femoral and pedal areas. There is no venous insufficiency of her lower extremities.  SKIN: Warm, clear and dry without rash or lesion.  EXTREMITIES: Reveal no clubbing, edema or cyanosis or swollen tender joints.   NEUROLOGICAL: Examination is alert and oriented x3 with full motor function. No focal motor deficit. She is limited to bed rest at the current time.  LABORATORY DATA:  Hematocrit 30%, platelet count 200,000. CPK MB is 0.2. INR is 1.4 and her white count is 9000.  IMPRESSION AND PLAN: A 50 year old female smoker, with single-vessel coronary artery disease, who would benefit from surgical coronary revascularization. She has had a significant retroperitoneal bleed found on cardiac catheterization, the 6 cm hematoma by CT scan. She has been given Plavix preoperatively totalling 225 mg. My recommendation will be to hold her Plavix for 4-5 days, follow the hematoma and schedule her semi-elective surgical revascularization in 4-5 days. If she develops further unstable angina then the plan for the timing surgery could be altered. This plan was discussed with the patient and she understands and agrees.  Thank you for this consultation.  Dictated by:   Mikey Bussing, M.D.  Attending Physician:  Talitha Givens DD:  06/28/01 TD:  06/28/01 Job: [phone]

## 2010-09-04 NOTE — Discharge Summary (Signed)
[place]. [facility]  Patient:    Dorothy Parker, Dorothy Parker Visit Number: [id number] MRN: [id number]          Service Type: MED Location: 2000 2016 01 Attending Physician:  Mikey Bussing Dictated by:   Mikey Bussing, M.D. Admit Date:  06/26/2001   CC:         CVTS  Rajan R. Revankar, M.D.  Dr. Lorin Picket, [facility], [place], [place]  [facility], [place], [place]   Discharge Summary  PREOPERATIVE DIAGNOSIS:  Class I stable angina, severe single vessel left anterior descending artery, 90% stenosis.  POSTOPERATIVE DIAGNOSIS:  Class I stable angina, severe single vessel left anterior descending artery, 90% stenosis.  OPERATION:  Coronary artery bypass grafting times one (left internal mammary artery to left anterior descending artery).  SURGEON:  Mikey Bussing, M.D.  ASSISTANT:  Adair Patter, P.A.-C.  ANESTHESIA:  General  ANESTHESIOLOGIST:  Bedelia Person, M.D.  INDICATIONS:  The patient is a 50 year old white female with a strong family history for coronary disease and a heavy smoking history.  She presented with symptoms of unstable angina and was admitted and ruled out for MI.  Cardiac catheterization demonstrated an ostial 90% stenosis of her LAD and she was felt to be a candidate for surgical coronary revascularization.  Prior to surgery, I discussed the patients medical presentation, cardiac catheterization, and reviewed the indications and expected benefits of coronary bypass surgery as recommended by her cardiologist.  I reviewed the major aspects of the operation with her including the location of the surgical incision, the choice of a mammary artery conduit for grafting, the use of general anesthesia and cardiopulmonary bypass, and the expected postoperative hospital recovery period.  I reviewed with the patient the risks to her of coronary artery bypass surgery including the risks of MI, CVA, bleeding,  blood transfusion requirement, infection, and death.  She understood these implications for the surgery, the alternatives to surgery, and agreed to proceed with the operation as planned under what I felt was an informed consent.  DESCRIPTION OF PROCEDURE:  The patient was brought to the operating room, placed supine on the operating room table where general anesthesia was induced under invasive hemodynamic monitoring.  The chest, abdomen and legs were prepped with Betadine and draped as a sterile field.  A sternal incision was made and the internal mammary artery was harvested as a pedicle graft from its origin at the squamous vessels.  It was a small vessel approximately 1.5 mm in diameter.  Heparin was administered and ACT was documented as being therapeutic.  The sternal retractor was placed.  Pursestrings were placed in the ascending aorta and right atrium and the patient was cannulated and placed on bypass.  The LAD was dissected and found to be a good vessel for grafting at the second diagonal level.  The internal mammary artery was prepared for the distal anastomosis and a cardioplegia cannula was placed.  As the aortic cross clamp was applied, 450 cc of cold blood cardioplegia was delivered to the aortic root with immediate cardioplegic arrest and septal temperature dropping less than 12 degrees.  Topical iced saline flush was used to augment myocardial infarction preservation and a pericardial instillator pad was used to protect the left phrenic nerve.  The distal coronary anastomosis was then performed.  The left internal mammary artery pedicle was brought through an opening created in the left lateral pericardium was brought down and sewn end-to-side with running 8-0 Prolene.  There was excellent flow through the anastomosis with immediate rise in septal temperature after release of the pedicle clamp on the mammary artery.  The mammary pedicle was secured to the epicardium and  the aortic cross clamp was removed.  The heart was cardioverted back to a regular rhythm.  The patient was rewarmed to 37 degrees.  Temporary pacing wires were applied and the lungs re-expanded.  The ventilator was resumed.  The patient was weaned from bypass without difficulty, without inotropes.  Protamine was administered.  There were no untoward effects of the protamine.  The cannulae were removed.  The mediastinum was irrigated with warm antibiotic irrigation.  The pericardium was loosely reapproximated.  Two mediastinal and left pleural chest tube were placed and brought through separate incisions.  The sternum was reapproximated with interrupted steel wire.  The pectoralis fascia was closed with interrupted #1 Vicryl.  The subcutaneous and skin were closed with running Vicryl.  Sterile dressings were applied.  Bypass time was 35 minutes with aortic cross clamp time of 20 minutes. Dictated by:   Mikey Bussing, M.D. Attending Physician:  Mikey Bussing DD:  07/03/01 TD:  07/04/01 Job: [id number]

## 2010-09-04 NOTE — H&P (Signed)
NAME:  DIXIE, COPPA                         ACCOUNT NO.:  [id number]   MEDICAL RECORD NO.:  [id number]                   PATIENT TYPE:  OIB   LOCATION:  6501                                 FACILITY:  [facility]   PHYSICIAN:  Charlton Haws, M.D.                  DATE OF BIRTH:  [birth year]   DATE OF ADMISSION:  11/08/2003  DATE OF DISCHARGE:                                HISTORY & PHYSICAL   PRIMARY CARE PHYSICIAN:  Dr. Manson Passey at the emergency clinic.   PRIMARY CARDIOLOGIST:  Aundra Dubin. Revankar, M.D.   CHIEF COMPLAINT:  Chest pain.   HISTORY OF PRESENT ILLNESS:  Ms. Baine is a 50 year old white female with  a history of bypass surgery in 2003.  She saw Dr. Tomie China, on October 24, 2003,  to reestablish with him because she was having ongoing chest pain.  She  states the chest pain has been going on for five weeks.  She states that it  is at the lower edge of her left rib cage and it gets slightly better with  sublingual nitroglycerin but has not resolved in the last five weeks.  Dr.  Tomie China evaluated her and felt that a Cardiolite was indicated.  She had  the Cardiolite, on November 04, 2003, and it showed questionable anterior  ischemia and an EF of greater than 65%.  He felt that cardiac  catheterization was indicated, and she is to come to the hospital for this  on November 11, 2003.   PAST MEDICAL HISTORY:  1. Aortocoronary bypass surgery, in March 2003, with LIMA to LAD.  2. Preserved left ventricular function with an EF of 65% by Cardiolite in     [place].  3. Ongoing tobacco use.  4. History of hypertension.  5. Dyslipidemia.  6. Peripheral vascular disease with carotid artery stenosis.  7. History of retroperitoneal hematoma post cath 2003.   PAST SURGICAL HISTORY:  Cardiac catheterization and bypass surgery.   ALLERGIES:  1. TOPROL which caused edema.  2. Possible allergies to [facility].  3. WELLBUTRIN.   MEDICATIONS:  1. Accupril 5 mg every day.  2. Aspirin 81 mg every  day.  3. Zetia 10 mg every day.  4. Seroquel 25 mg two tabs q.h.s.  5. Flonase p.r.n.  6. Nitroglycerin p.r.n.   FAMILY HISTORY:  Her mother is alive and has hypertension but no history of  coronary artery disease.  Her father is alive and has a history of heart  disease.  She has one brother that has died of cancer and four other  brothers.   SOCIAL HISTORY:  She is currently living with her daughter and her  daughter's boyfriend but this is a very stressful situation.  She is  divorced.  She has approximately a 30 pack year history of tobacco and  continues to smoke.  She denies alcohol or ETOH abuse.   REVIEW OF SYSTEMS:  Significant for some feelings of anxiety.  She denies  any fevers, cough, or chills.  She states she gets slight shortness of  breath with the chest pain but is otherwise okay.  She denies any  hematemesis, hemoptysis, or melena.   PHYSICAL EXAMINATION:  VITAL SIGNS:  She is afebrile.  Blood pressure  130/80, pulse 80, respiratory rate 20.  GENERAL:  She is a well-developed, well-nourished, white female in no acute  distress.  HEENT:  Her head is normocephalic and atraumatic with pupils equal, round,  and reactive to light and accommodation, and extraocular movements are  intact.  Sclerae are clear.  Nares without discharge.  NECK:  There is no JVD noted and no thyromegaly is appreciated.  She has  faint carotid bruits bilaterally.  CHEST:  Clear to auscultation bilaterally.  CV:  Her heart is regular in rate and rhythm with an S1 S2 and a faint  systolic ejection murmur at the apex.  ABDOMEN:  Firm and nontender with active bowel sounds and no  hepatosplenomegaly is appreciated.  EXTREMITIES:  There is no cyanosis or clubbing or edema.  No rashes or  lesions are appreciated.  She has 2+ distal pulses in all four extremities  and no femoral bruits are appreciated.  MUSCULOSKELETAL:  There is no joint deformity or effusion and no spine or  CVA tenderness.   NEURO:  She is alert and oriented  with cranial nerves II-XII grossly  intact.  SKIN:  No rashes or lesions are noted.   EKG:  Sinus rhythm with no acute ischemic changes.   LABORATORY VALUES:  Hemoglobin 14.0, hematocrit 40.9, WBC 7.8, platelets  243.  INR is 0.9, PTT 34.  TSH 0.492.  Sodium 137, potassium 3.6, chloride  105, CO2 18, BUN 10, creatinine 0.7, glucose 94.   ASSESSMENT/PLAN:  Chest pain, abnormal Cardiolite.  The situation was  discussed with Dr. Jens Som.  The patient is having continuous chest pain  for the last five weeks and her Cardiolite showed a questionable abnormality  with preserved left ventricular function.  Her symptoms are unchanged over  the last five weeks.  It is felt that she is stable for an outpatient  catheterization in the [facility].  She was given a prescription for Imdur, and  she has a prescription for nitroglycerin.  If her symptoms change or worsen  in any way, she knows that she is to take her nitroglycerin and come to the  emergency room.      Theodore Demark, P.A. [facility]                  Charlton Haws, M.D.    RB/MEDQ  D:  11/08/2003  T:  11/08/2003  Job:  [id number]

## 2010-09-04 NOTE — Discharge Summary (Signed)
[place]  Patient:    Dorothy Parker, Dorothy Parker Visit Number: [id number] MRN: [id number]          Service Type: MED Location: 2000 2016 01 Attending Physician:  Mikey Bussing Dictated by:   Adair Patter, P.A.C. Admit Date:  06/26/2001 Discharge Date: 07/06/2001   CC:         CVTS office  Rajan R. Revankar, M.D.  [facility]   Discharge Summary  DATE OF BIRTH:  15-Aug-1960.  ADMISSION DIAGNOSIS:  Angina.  SECONDARY DIAGNOSES: 1. Right retroperitoneal hematoma. 2. Hypertension.  DISCHARGE DIAGNOSIS:  Coronary artery disease.  HOSPITAL PROCEDURES: 1. Cardiac catheterization. 2. Bilateral carotid Duplex. 3. Pre-CABG Dopplers. 4. Coronary artery bypass grafting x1.  HOSPITAL COURSE:  Mrs. Dehne was admitted to [facility] on June 26, 2001, after presenting with classic ischemic anginal pain. Because of this, patient underwent cardiovascular work-up.  Cardiac catheterization revealed she had coronary artery disease amenable to surgical correction.  Prior to undergoing surgery though, she was found to have a right retroperitoneal hematoma.  This was followed with serial CAT scans which revealed that hematoma was stable and, in fact, resolving by the time of surgery.  On July 03, 2001, the patient underwent a coronary artery bypass graft x1 with internal mammary anastomosed to left anterior descending artery. Mikey Bussing, M.D., performed this procedure.  No complications were noted during the procedure.  Postoperatively, the patient had an uneventful hospital course.  She was weaned off oxygen by postop day #1.  She began ambulating well and was subsequently deemed stable for discharge. Hemoglobin and hematocrit at time of discharge were 10.1 and 29.3.  BUN and creatinine were 5 and 0.5, respectively.  MEDICATIONS AT TIME OF DISCHARGE: 1. Tylox one to two tablets every four to six hours as needed  for pain. 2. Aspirin 325 mg one daily. 3. Zestril 5 mg one daily.  ACTIVITY:  The patient was told to avoid driving, strenuous activity, and lifting heavy objects. She was told to walk daily and continue breathing exercises.  DISCHARGE DIET:  Low fat, low salt.  WOUND CARE:  The patient was told she could shower and clean her incisions with soap and water.  DISPOSITION:  Home.  FOLLOW-UP:  The patient was told to call her cardiologist, Aundra Dubin. Revankar, M.D., for appointment in three weeks.  She was told to see Dr. Donata Clay at the CVTS office on Friday, July 28, 2001, at 9 oclock a.m.  She was told to have a chest x-ray take at [facility] one hour before her appointment with Dr. Donata Clay. Dictated by:   Adair Patter, P.A.C. Attending Physician:  Mikey Bussing DD:  07/05/01 TD:  07/06/01 Job: [id number]

## 2015-11-17 DIAGNOSIS — I10 Essential (primary) hypertension: Secondary | ICD-10-CM | POA: Insufficient documentation

## 2015-11-17 DIAGNOSIS — I2510 Atherosclerotic heart disease of native coronary artery without angina pectoris: Secondary | ICD-10-CM | POA: Insufficient documentation

## 2015-12-09 ENCOUNTER — Encounter (INDEPENDENT_AMBULATORY_CARE_PROVIDER_SITE_OTHER): Payer: Self-pay | Admitting: Neurology

## 2015-12-09 ENCOUNTER — Ambulatory Visit (INDEPENDENT_AMBULATORY_CARE_PROVIDER_SITE_OTHER): Payer: Medicare Other | Admitting: Neurology

## 2015-12-09 VITALS — BP 119/77 | HR 72 | Temp 97.8°F | Resp 16 | Ht 64.0 in | Wt 131.0 lb

## 2015-12-09 DIAGNOSIS — G35 Multiple sclerosis: Secondary | ICD-10-CM

## 2015-12-09 MED ORDER — NORTRIPTYLINE HCL 25 MG OR CAPS
50.00 mg | ORAL_CAPSULE | Freq: Every evening | ORAL | 0 refills | Status: DC
Start: 2015-12-09 — End: 2016-10-22

## 2015-12-09 MED ORDER — BACLOFEN 10 MG OR TABS
10.00 mg | ORAL_TABLET | Freq: Every day | ORAL | 0 refills | Status: DC
Start: 2015-12-09 — End: 2017-10-19

## 2015-12-09 MED ORDER — GABAPENTIN 300 MG OR CAPS
600.00 mg | ORAL_CAPSULE | Freq: Three times a day (TID) | ORAL | Status: DC
Start: 2015-12-09 — End: 2016-09-24

## 2015-12-09 MED ORDER — DULOXETINE HCL 30 MG OR CPEP
60.00 mg | ORAL_CAPSULE | Freq: Two times a day (BID) | ORAL | 0 refills | Status: DC
Start: 2015-12-09 — End: 2016-08-24

## 2015-12-09 MED ORDER — TIZANIDINE HCL 4 MG OR CAPS
4.00 mg | ORAL_CAPSULE | Freq: Three times a day (TID) | ORAL | 0 refills | Status: DC
Start: 2015-12-09 — End: 2016-03-16

## 2015-12-09 MED ORDER — SERTRALINE HCL 100 MG OR TABS
50.00 mg | ORAL_TABLET | Freq: Every day | ORAL | 0 refills | Status: DC
Start: 2015-12-09 — End: 2016-08-24

## 2015-12-09 MED ORDER — LIOTHYRONINE SODIUM 25 MCG OR TABS
25.00 ug | ORAL_TABLET | Freq: Three times a day (TID) | ORAL | 0 refills | Status: DC
Start: 2015-12-09 — End: 2016-09-24

## 2015-12-09 MED ORDER — DIAZEPAM 5 MG OR TABS
5.00 mg | ORAL_TABLET | ORAL | Status: DC
Start: 2015-12-09 — End: 2018-05-02

## 2015-12-09 NOTE — Progress Notes (Signed)
[facility]    First visit: 12/09/2015     Last visit: NA     Current visit: 12/09/2015    Consultation Source: seen at [facility], Lavon Paganini and Christy Gentles, [email or web address] [phone]    Principle Neurological Diagnosis: Clinically definite Multiple Sclerosis, relapsing form    Narrative History for Current Visit: Debra Bolton is a 55 year old woman living in [place] for the past 2 years, but originally from [place], seen for 27 years by Dr. Lavon Paganini at [facility]. 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 Imaging studies were not available for review today. We reviewed the history and problem list in detail today which is documented in the following sections. The primary reason for her visit is establish care. She does not have any health providers here in [place]. She has been on tysabri traveling back and forth to [place] for infusions but her last infusion was on 09/11/15.  _____________________________________________________________________    MS Problem List:   1. Mobility (ND;range 0-6):none  2. Hand Function (ND;range 0-5):right hand clumsiness  3. Vision (ND; range 0-5):blurry peripheral vision   4. Fatigue ( ND; range 0-5):none  5. Cognition ( ND; range 0-5):episodes of word finding difficulties within past several years  6. Bowel/Bladder ( ND; range 0-5): rare bowel incontinence starting 10 years ago, infrequent urinary retention   7. Sensory ( ND; range 0-5): bilateral leg tingling and pain (right > left), rarely with arm tingling (right >>left)  8. Spasticity ( ND; range 0-5): bilateral legs, improved with yoga  9. Pain ( ND; range 0-5): lower extremity tingling and pain    MS Performance Scale Score (sum of problem list scores 1-9):  ND  (result ranges from 0 (normal) to 46 (maximal interference of symptoms on life))    10. Affective: (BDI II:  ND)  longstanding history of depression  11. Social Support: lives with boyfriend, 3 daughters none present in [place]. Other symptoms or problems:  None  ______________________________________________________________________    MS History      Initial Symptoms (Date and Description): 1987 (~age 33), after delivery of first daughter, both legs would fall asleep during exercise  1988 grand mal seizure in doctor office  1989 another grand mal seizure in [place] while on vacation, was placed on tegretol for 2 years  1991 diagnosed with MS as she had more frequent episodes of fatigue, depression and numbness     Second Attack (Date and Description):     Subsequent relapses and course:    Relapses in Past 2 years: see above     Progressive Disease (approx onset and description): see above      Disease Modifying Treatment History: [Documented Needle phobia: No]    1. Betaseron (~ 1 year on therapy)  2. Copaxone ( <1 year) discontinued because it worsened depression  3. Fritzi Mandes [id number] - Sep 11, 2015)    ______________________________________________________________________    Review of Systems: Complete 10 point review of symptoms from the patient self report questionnaire was reviewed with the patient today and scanned into the media tab. Any pertinent positives are listed above in the narrative history or problem list.    Allergies   Allergen Reactions   . Penicillins Rash       Current Outpatient Prescriptions   Medication Sig   . baclofen (LIORESAL) 10 MG tablet Take 1 tablet (10  mg) by mouth daily.   . diazepam (VALIUM) 5 MG tablet Take 1 tablet (5 mg) by mouth once a week.   . DULoxetine (CYMBALTA) 30 MG capsule Take 1 capsule (30 mg) by mouth daily.   Marland Kitchen gabapentin (NEURONTIN) 300 MG capsule Take 1 capsule (300 mg) by mouth at bedtime.   Marland Kitchen liothyronine (CYTOMEL) 25 MCG tablet Take 1 tablet (25 mcg) by mouth daily.   . nortriptyline (PAMELOR) 25 MG capsule Take 2 capsules (50 mg) by mouth nightly.   . sertraline  (ZOLOFT) 100 MG tablet Take 1 tablet (100 mg) by mouth daily.   . tizanidine (ZANAFLEX) 4 MG capsule Take 1 capsule (4 mg) by mouth 3 times daily.     No current facility-administered medications for this visit.        Past Medical History:   Diagnosis Date   . Hypothyroidism    . Major depressive disorder, single episode    . MS (multiple sclerosis) (CMS-HCC)      No past surgical history on file.    Family Medical History:  Family History   Problem Relation Age of Onset   . Other Mother    . Cancer Father    . Cancer Brother      ______________________________________________________________________    Examination:   BP 119/77 (BP cuff site: Left;Upper;Arm, BP Patient Position: Sitting)  Pulse 72  Temp 97.8 F (36.6 C) (Oral)  Resp 16  Ht 5\' 4"  (1.626 m)  Wt 59.4 kg (131 lb)  SpO2 100%  BMI 22.49 kg/m2  General: Examination of the skin, joints and extremities revealed no abnormalities  ;  There were no cervical, ocular or cranial bruits.     Cognitive/Behavioral: Examination of cognition, language and prosody revealed no abnormalities . Behavioral and affect was appropriate.    Cranial Nerves: Near visual acuity was 20/400 OD and J7 OS without correction.Visual fields were full  to confrontation testing. Lids were normal .  Pupils were 4mm  and reactive with no  RAPD . Fundoscopic exam revealed no definite optic nerve pallor. Eyes were orthotropic with bilateral rotary nystagmus and left end point nystagmus.  Pursuit and saccadic eye movements revealed few beats of lateral nystagmus in both directions. Facial movements revealed no abnormalities. Testing of facial sensation revealed no abnormalities. Hearing was normal  AD and normal  AS. Bulbar examination revealed no abnormalities .     Motor: Upper extremity strength was as follows (R/L): Shoulder abduction 5/5, Elbow extension 5/5, Wrist extension 5/5, Hand instrinsics 5/5. Rapid alternating movements were graded 1/1+. Spasticity ([place]) was rated  1/1. Lower extremity strength was as follows (R/L): Hip Flexors 5/5, Knee Flexors 5/5, Ankle dorsiflexors 5/5; Toe tapping was rating 1/1; Spasticity was rated 1+/1  There was no atrophy. There were no fasiculations.    Cerebellar: Finger to nose testing revealed no abnormalities  in the RUE and no abnormalities  in the LUE. Heel to shin testing revealed no abnormalities  in the RLE and no abnormalities  in the LLE. (see gait for description of midline/axial cerebellum dysfunction)    Sensory: Sensation to pp and temperature was normal. Vibration sensation duration (secs) was (R/L): Upper extremity middle finger ND/ND; Lower extremity big toe   Joint position sensation was normal .      Reflexes: (R/L): Biceps 2+/2+, BR 2+/2+, Triceps 2+/2+, Patella 2+/2+, Ankle 2/2. There was no clonus .     Gait Description: normal gait and station.  Ambulation was performed safely without  assistance. The patient was able to  tandem walk 8 steps without faltering.   ----------------------------------------------------------------------------------------------------------------------  Performance Measures:  No flowsheet data found.    No flowsheet data found.    No flowsheet data found.    No flowsheet data found.    No flowsheet data found.    No flowsheet data found.    ----------------------------------------------------------------------------------------------------------------------    Review of Imaging Studies:  Unavailable, last reportedly had MRI brain and spine January 2017    Review of Labs:  1. CSF reportedly with oligoclonal bands  JC virus 0.24  (09/11/15)    Assessment:   1.  Clinically definite Multiple Sclerosis, relapsing form, clinically stable    Atypical features (red flags) include: seizure at onset  Individual risk factors for disease activity and worsening disability include: None known but no imaging studies available    We discussed the pathophysiology and natural history of  disease today with an emphasis  on  current set of problems (see above) and individual risk factors for disease progression. This was followed by a discussion of  a wellness program, symptom management,  rehabilitation issues  and disease modifying therapies.     After discussing the options available and the potential benefits (pros) and risks (cons), we mutually decided on the following management plan    Plan:  1. Bring MRI on CD from [facility] to next visit  2. Check JCV index  3. Will restart Tysabri asap to avoid rebound; can always alter treatment decision later  4. Follow up in 3 months  5. Set up MyChart    Total time spent with the patient today was 60 minutes with over 50% of the time spent on education and counseling regarding their problem list and the assessment and plan    Seen and discussed with Dr. Tresa Res, MS fellow

## 2015-12-09 NOTE — Patient Instructions (Addendum)
1. Bring MRI on CD from [facility] to next visit  2. Check JCV  3. Will restart Tysabri  4. Follow up in 3 months  5. Set up MyChart    Sign up for MS expo at [email or web address]  October 8th, 2017    IMPORTANT INFORMATION FOR [facility] MS PATIENTS    Knowing who to call or email about specific questions or problems and how to receive required medications, procedures and services is very important at [facility]. To help you, we have put together the following guide.    HOW TO SCHEDULE ORDERED TESTS OR PROCEDURES?  We generally advise calling the appropriate number listed below for your appointment at least 3 business days after the order is placed . This ensures the order is received and the prior authorization has been obtained for the test or procedure. Please note that it may take more than 3 days to get the prior authorization but at least the order should have been received. More urgent tests or procedures will be arranged during or shortly after your procedure.    MRI Scans:  1. [facility] (CTIPM) [phone]   Or  2. [facility] at [place], [place], [place]: Telephone # [phone]    Infusion Therapy or plasmapheresis:  [facility] for Infusion therapy: [phone]  or  [place]: [phone]    Physical Therapy: [place] including Wheel chair seating clinic: [place] MS ?  We complete the required paperwork to start a disease modifying agent with you during a visit. This is sent along with supporting chart notes and insurance documentation to a specialty pharmacy vendor who checks on coverage and begins the authorization process. Different insurance plans have different guidelines for treatment (called a formulary) that require [place] to justify our treatment recommendations in most  instances. For instance, many insurance plans require you to take an injectible therapy (an interferon or copaxone or both) before you can receive an oral therapy or infusion therapy. Some insist on using one oral therapy instead of another. The process of obtaining prior authorization is frustrating, complex and time consuming.  Please  check on the status of a prior authorization no more than once every 2 weeks after the initial order is placed at a visit. The prior authorizations can take anywhere from 2 days to 3 months (rarely longer), although we do our best to speed up the process. If you are in a certain income category, we can often get you free drug from the pharmaceutical company after we receive one or two official denials from your insurance company,depending on the drug.    WHERE CAN PATIENTS BE SEEN FOR MS CENTER VISITS?  Outpatient office hours are mainly at [facility] with the following schedule as of today:  [facility]: [address], Medical offices [address] building, 3rd floor, [address], [place], [place] (Telephone Number: [phone])   Clinic Hours:   Monday: 8 am to 1 pm   First Tuesday of month: 12:30 to 5 pm (Multidisciplinary clinic)   Weds: 8 am to 2 pm   Thursday: 8 am to 1 pm     [facility]: [address], 3rd floor ([place]), [place], [place], [address] (Telephone Number: [phone])   Second and Fourth Tuesday of the month: 8 am to 1 pm  Third and Fifth Tuesday of the Month: 1 pm to 5 pm    WHERE CAN I GET GENERAL INFORMATION ABOUT MS OR RELATED DISEASES FROM EXPERTS AND ASK GENERAL QUESTIONS:  [email or web address] is a Primary school teacher Center started by Dr Rosendo Gros. Anyone can use this site to find links to validated information on MS and learn about the disease at their own pace. The site also has a button that allows you to, "Ask a Question" and get a response from internationally recognized MS experts from all fields  including neurology, rehabilitation, Physical therapy, equipment specialists, MS nurses and others. Simply click on the link above or copy and paste it into your Internet browser to start learning more and find useful resources.    WHERE SHOULD RECORDS OR OTHER INFORMATION BE SENT?  The [facility] address listed above should be used for all correspondence no matter where you are seen. The fax number for [facility] is [place] A PATIENT?  The preferred method of communication is through [employer], an Internet portal that provides direct access to your care provider and also provides access to your medical records. Common activities such as asking medical questions, reporting non-urgent problems, getting a prescription refill, changing appointments or getting referrals can all be done through [facility].  You can expect responses generally within 3 working days. To get a MyChart Account, please ask anyone at the front desk to give you an access code.  You can still call the clinic for help but beware that the turn around time is often longer with phone calls than MyChart if the problem is not urgent. You should also know that telephone calls are handled by a centralized call center that is not part of the MS program.   For urgent assistance please call [phone] and ask whoever answers to page me directly    RESEARCH PARTICIPATION    Go to [email or web address].org and learn how to participate in the largest MS study ever done without leaving the comfort of your home. This national study run by the [employer] Project and lead by Dr Rosendo Gros at [employer], will eventually recruit 20,000 people with Multiple Sclerosis from around the country. Visit the website and view the video to learn more about the goals of this study and how you can help improve the care and management of people with MS everywhere. There are no specific requirements for participation; if you  receive an email request to participate in a survey or study after enrolling, you can chose to participate or ignore the request. Periodically, we will ask you to update [place] with information on your condition. These request will not be frequent and should not take much time out of your busy day. Join now and spread the word to other people with MS that you meet.      WHO IS RESPONSIBLE FOR HANDLING SPECIFIC ISSUES ? ( MANY OF THESE RESPONSIBILITIES ARE SHARED, BUT THESE ARE THE PRIMARY CONTACTS. All can be reached through the central call center [phone] but we would prefer that you use MyChart)    Prescription refills: Birdie Sons and Marijean Heath, RN    Prior authorizations: Bosie Clos and Carolann Littler (both are pharmacists in the [place] program); Birdie Sons handles some requests    Forms that require completion: Marijean Heath, RN    Appointment requests: Birdie Sons and Annamaria Helling    Durable medical equipment: Marijean Heath  RN for now    Medical questions or issues regarding new symptoms or problems: Jarold Song MD or Marijean Heath, RN or Bosie Clos PharmD or Carolann Littler PharmD    Overall clinic coordination and supervision (when no one else knows the answer or if you have a complaint): Day PHONE NUMBERS:    National MS Society for programs and support : [phone]  MS Association of [place] Sm[place]):  [phone]

## 2015-12-09 NOTE — Progress Notes (Signed)
Patient seen, discussed and examined with Dr Yeung today. I personally edited or confirmed all elements of his note during our combined evaluation today and agree with the assessment and plan as noted

## 2015-12-10 DIAGNOSIS — G35 Multiple sclerosis: Secondary | ICD-10-CM | POA: Insufficient documentation

## 2015-12-10 NOTE — Addendum Note (Signed)
Addended byGlennon Hamilton on: 12/10/2015 09:17 AM     Modules accepted: Orders

## 2015-12-10 NOTE — Addendum Note (Signed)
Addended byGlennon Hamilton on: 12/10/2015 02:20 PM     Modules accepted: Orders

## 2015-12-11 ENCOUNTER — Telehealth (HOSPITAL_BASED_OUTPATIENT_CLINIC_OR_DEPARTMENT_OTHER): Payer: Self-pay

## 2015-12-11 NOTE — Telephone Encounter (Signed)
Called patient to schedule tysabri. No answer left message to call back.

## 2015-12-19 ENCOUNTER — Telehealth (HOSPITAL_BASED_OUTPATIENT_CLINIC_OR_DEPARTMENT_OTHER): Payer: Self-pay | Admitting: Student in an Organized Health Care Education/Training Program

## 2015-12-19 NOTE — Telephone Encounter (Signed)
Regarding patient's Tysabri treatment: she is now Touch authorized under Dr. Rosendo Gros and her insurance has authorized continued infusions.    Attempted to contact patient, left VM

## 2015-12-23 NOTE — Telephone Encounter (Signed)
Reached patient. She has been out of town. She will call infusion center back to schedule.

## 2016-01-02 ENCOUNTER — Ambulatory Visit
Admission: RE | Admit: 2016-01-02 | Discharge: 2016-01-05 | Disposition: A | Discharge status: Home / Self Care | Payer: Medicare Other | Attending: Neurology | Admitting: Neurology

## 2016-01-02 DIAGNOSIS — G35 Multiple sclerosis: Principal | ICD-10-CM | POA: Insufficient documentation

## 2016-01-02 LAB — CBC WITH DIFF, BLOOD
ANC-Automated: 3 10*3/uL (ref 1.6–7.0)
Abs Eosinophils: 0.2 10*3/uL (ref 0.1–0.5)
Abs Lymphs: 2.8 10*3/uL (ref 0.8–3.1)
Abs Monos: 0.6 10*3/uL (ref 0.2–0.8)
Eosinophils: 3 %
Hct: 36.1 % (ref 34.0–45.0)
Hgb: 12.4 gm/dL (ref 11.2–15.7)
Lymphocytes: 42 %
MCH: 29.3 pg (ref 26.0–32.0)
MCHC: 34.3 g/dL (ref 32.0–36.0)
MCV: 85.3 um3 (ref 79.0–95.0)
MPV: 9.8 fL (ref 9.4–12.4)
Monocytes: 9 %
Plt Count: 224 10*3/uL (ref 140–370)
RBC: 4.23 10*6/uL (ref 3.90–5.20)
RDW: 12.9 % (ref 12.0–14.0)
Segs: 46 %
WBC: 6.5 10*3/uL (ref 4.0–10.0)

## 2016-01-02 MED ORDER — SODIUM CHLORIDE 0.9 % IV SOLN
300.0000 mg | Freq: Once | INTRAVENOUS | Status: AC
Start: 2016-01-02 — End: 2016-01-02
  Administered 2016-01-02: 300 mg via INTRAVENOUS
  Filled 2016-01-02: qty 15

## 2016-01-02 MED ORDER — SODIUM CHLORIDE 0.9 % IV SOLN
INTRAVENOUS | Status: AC
Start: 2016-01-02 — End: 2016-01-02
  Administered 2016-01-02: 14:00:00 via INTRAVENOUS

## 2016-01-02 NOTE — Interdisciplinary (Signed)
Non-Chemotherapy Infusion Nursing Note -  [place]    [place]ra [place] is a 55 year old female who presents for infusion of Tysabri    Vitals:    01/02/16 1322   BP: 120/76   BP cuff site: Left;Upper;Arm   BP Patient Position: Sitting   Pulse: 75   Resp: 16   Temp: 98.4 F (36.9 C)   TempSrc: Oral   SpO2: 97%   Weight: 59.9 kg (132 lb 1.6 oz)   Height: 5\' 4"  (1.626 m)     Pain Score: 0  Body surface area is 1.64 meters squared.  Body mass index is 22.67 kg/(m^2).    Pre-treatment nursing assessment:  Patient is ambulatory and in no distress.  She reported receiving Tysabri 4 months ago in [place]. She tolerated it well.  No reported acute changes with mobility, vision or headaches.  TouchProgram questionnaire completed on line.    PIV placed. JCV virus and CBC drawn.  Tysabri infused and completed.    [place] [place] tolerated treatment well.    Post blood return: Brisk  Post-Flush: NS and Discontinued IV    Patient Education  Learner: Patient  Barriers to learning: No Barriers  Readiness to learn: Acceptance  Method: Explanation    Treatment Education: Information/teaching given to patient including: signs and symptoms of infection, bleeding, adverse reaction(s), symptom control, and when to notify MD.    [name]: Instructed patient to call for assistance.    Pain Education: Patient instructed to contact nurse if pain should develop or if their current pain therapy becomes ineffective.    Response: Verbalizes understanding    Discharge Plan  Discharge instructions given to patient.  Future appointments given and reviewed with treatment plan.  Discharge Mode: Ambulatory     Accompanied by: Self  Discharged To: Home

## 2016-01-13 LAB — JCV AB (WITH INDEX) WITH/REFLEX TO INHIBITION ASSAY

## 2016-01-30 ENCOUNTER — Ambulatory Visit
Admission: RE | Admit: 2016-01-30 | Discharge: 2016-02-02 | Disposition: A | Discharge status: Home / Self Care | Payer: Medicare Other | Attending: Neurology | Admitting: Neurology

## 2016-01-30 DIAGNOSIS — G35 Multiple sclerosis: Principal | ICD-10-CM | POA: Insufficient documentation

## 2016-01-30 MED ORDER — SODIUM CHLORIDE 0.9 % IV SOLN
INTRAVENOUS | Status: AC
Start: 2016-01-30 — End: 2016-01-30
  Administered 2016-01-30: 13:00:00 via INTRAVENOUS

## 2016-01-30 MED ORDER — SODIUM CHLORIDE 0.9 % IV SOLN
300.0000 mg | Freq: Once | INTRAVENOUS | Status: AC
Start: 2016-01-30 — End: 2016-01-30
  Administered 2016-01-30: 300 mg via INTRAVENOUS
  Filled 2016-01-30: qty 15

## 2016-01-30 NOTE — Interdisciplinary (Signed)
Non-Chemotherapy Infusion Nursing Note -  Debra [place] is a 55 year old female who presents for infusion of Tysabri.  PT IS ALERT AND AMBULATORY.  PT HAS NO DISTRESS OR COMPLAINTS OF ANY DISCOMFORT.  PT HAS NO REPORT OF FEVER OR CHILL.  PT HAS NO SHORTNESS OF BREATH.     Vitals:    01/30/16 1308   BP: 131/78   BP Location: Left arm   BP Patient Position: Sitting   Pulse: 88   Resp: 16   Temp: 98.5 F (36.9 C)   TempSrc: Oral   SpO2: 97%   Weight: 58.8 kg (129 lb 9.6 oz)   Height: 5\' 4"  (1.626 m)     Pain Score:    Body surface area is 1.63 meters squared.  Body mass index is 22.25 kg/(m^2).    Pre-treatment nursing assessment:  No problems identified upon assessment.  JCV is negative as of 12/2015.  Touch Program updated.    Jaynie Lamaster tolerated IV Tysabri 300mg  over an hour treatment well.  Line flushed with NS.  Post blood return: Brisk  Post-Flush: NS and Discontinued IV    Patient Education  Learner: Patient  Barriers to learning: No Barriers  Readiness to learn: Acceptance  Method: Explanation    Treatment Education: Information/teaching given to patient including: signs and symptoms of infection, bleeding, adverse reaction(s), symptom control, and when to notify MD.    [name]: Instructed patient to call for assistance.    Pain Education: Patient instructed to contact nurse if pain should develop or if their current pain therapy becomes ineffective.    Response: Verbalizes understanding    Discharge Plan  Discharge instructions given to patient.  Future appointments given and reviewed with treatment plan.  Discharge Mode: Ambulatory  Discharge Time: 1430  Accompanied by: Self  Discharged To: Home

## 2016-02-25 ENCOUNTER — Telehealth (INDEPENDENT_AMBULATORY_CARE_PROVIDER_SITE_OTHER): Payer: Self-pay | Admitting: Neurology

## 2016-02-25 ENCOUNTER — Telehealth (HOSPITAL_BASED_OUTPATIENT_CLINIC_OR_DEPARTMENT_OTHER): Payer: Self-pay | Admitting: Neurology

## 2016-02-25 NOTE — Telephone Encounter (Signed)
Spoke to patient, will proceed with Tysabri infusion. She will see ophthalmology Friday, and see how her vision progresses. Patient wishes to defer steroids at this time due to poor reaction in the past to steroids.

## 2016-02-25 NOTE — Telephone Encounter (Addendum)
Appointment rescheduled.

## 2016-02-25 NOTE — Telephone Encounter (Signed)
Pt is calling to inform that she is having issues/pain with vision on R eye. She is scheduled for TYSABRI infusion tomorrow and is concerned Amelia Jo might be an issue. She is concerned the symptom might be optic neuritis, she would like to hear back today if possible. Please advise.

## 2016-02-25 NOTE — Telephone Encounter (Signed)
Pt called to r/s her 11/09 infusion center appointment to 11/13, if possible.  Request forwarded to [facility] Schedule Coordinator.  Pt's phone: [phone]

## 2016-02-26 ENCOUNTER — Ambulatory Visit (HOSPITAL_BASED_OUTPATIENT_CLINIC_OR_DEPARTMENT_OTHER): Admit: 2016-02-26 | Payer: Medicare Other

## 2016-02-27 ENCOUNTER — Encounter (INDEPENDENT_AMBULATORY_CARE_PROVIDER_SITE_OTHER): Payer: Self-pay | Admitting: Neurology

## 2016-02-27 NOTE — Telephone Encounter (Signed)
From: [name]  To: Arelia Sneddon, MD  Sent: 02/27/2016 12:39 PM PST  Subject: 1-Non Urgent Medical Advice    per my conversation with Dr. Glennon [place] a few days ago about an eye issue.    On November 10th I was seen by Dr. Wynetta Emery at [facility] eye specialist in [place] (phone [phone]) for vision issues in my right eye.     Basically he said my right eye was inflamed (uveitis) consistent with an MS flare up. I am scheduled to see a retina specialist on the 27th to assess if there is a need for a steroid injection, as for now I was given Pred ForteQ1hr OD and cyclogy BID OD as well.  He also explained that IV steroids if necessary would only help the eye issue.     I am continuing to lay low and do little in hopes that the need for steroids will pass, but I am also well aware that I may not be the best decision maker on these issues.    I have my next tysabri on November 13th at 2;00 in [place].. I also checked and the last tysabri I had in [place] was may 2017 and I resumed in [place].in September. Please let me know if there is anything else I should do.    thank you. [name] [phone]

## 2016-02-27 NOTE — Telephone Encounter (Signed)
Spoke to patient, wishes to defer steroids for now. Will follow up.

## 2016-03-01 ENCOUNTER — Ambulatory Visit
Admission: RE | Admit: 2016-03-01 | Discharge: 2016-03-04 | Disposition: A | Discharge status: Home / Self Care | Payer: Medicare Other | Attending: Neurology | Admitting: Neurology

## 2016-03-01 DIAGNOSIS — G35 Multiple sclerosis: Principal | ICD-10-CM | POA: Insufficient documentation

## 2016-03-01 MED ORDER — NATALIZUMAB 300 MG/15ML IV CONC
300.00 mg | Freq: Once | INTRAVENOUS | Status: AC
Start: 2016-03-01 — End: 2016-03-01
  Administered 2016-03-01: 300 mg via INTRAVENOUS
  Filled 2016-03-01: qty 15

## 2016-03-01 MED ORDER — SODIUM CHLORIDE 0.9 % IV SOLN
INTRAVENOUS | Status: AC
Start: 2016-03-01 — End: 2016-03-01
  Administered 2016-03-01: 14:00:00 via INTRAVENOUS

## 2016-03-01 NOTE — Interdisciplinary (Signed)
Non-Chemotherapy Infusion Nursing Note - [place]    Debra [place] is a 55 year old female who presents for infusion of TYSABRI    Vitals:    03/01/16 1333   BP: 132/86   BP Location: Left arm   BP Patient Position: Sitting   Pulse: 84   Resp: 16   Temp: 98.1 F (36.7 C)   TempSrc: Oral   SpO2: 98%   Weight: 59.9 kg (132 lb 1.6 oz)   Height: 5\' 4"  (1.626 m)     Pain Score: 0  Body surface area is 1.64 meters squared.  Body mass index is 22.67 kg/(m^2).    Pre-treatment nursing assessment:  No problems identified upon assessment.  Left Forearm PIV obtained with 24 g Intima cath set, good blood return, free flow Ns flush.  No labs required with today's treatment.  NS 0.9% TKO continuous starting.  No pre-medications indicated for today's treatment.  Treatment: Touch program "pre-infusion Patient Checklist" completed, no new incidents or concerns expressed by patient.               natalizumab (TYSABRI) 300 mg in 100 ml NS over 60 minutes.     Flushed line with 20 ml NS.    [name] tolerated treatment well.    Post blood return: Brisk  Post-Flush: NS and Discontinued intact Intima cath set, pressure dressing applied.    Patient Education  Learner: Patient  Barriers to learning: No Barriers  Readiness to learn: Acceptance  Method: Explanation    Treatment Education: Information/teaching given to patient including: signs and symptoms of infection, bleeding, adverse reaction(s), symptom control, and when to notify MD.    [name]: Instructed patient to call for assistance.    Pain Education: Patient instructed to contact nurse if pain should develop or if their current pain therapy becomes ineffective.    Response: Verbalizes understanding    Discharge Plan  Discharge instructions given to patient.  Future appointments given and reviewed with treatment plan.  Discharge Mode: Ambulatory  Discharge Time: [place] by: Self  Discharged To: Home

## 2016-03-04 ENCOUNTER — Encounter (INDEPENDENT_AMBULATORY_CARE_PROVIDER_SITE_OTHER): Payer: Self-pay | Admitting: Neurology

## 2016-03-04 NOTE — Telephone Encounter (Signed)
From: [name]  To: Arelia Sneddon, MD  Sent: 03/04/2016 1:33 PM PST  Subject: 1-Non Urgent Medical Advice    Hi Dr Aurea Graff,     [facility] all is well in your family, I am just checking in with you about the MS issues. I am still doing eyedrops every hour for the uveitis and not noticing any difference in the symptoms. My question to you is, is it possible I am hurting my chance of healing in my eye by not doing IV steroids? I would hate for my inactivity to cause sight loss.   I am fairly wiped out, a bit depressed and frustrated that my vision is off, so probably in an attack, but not sure how we should deal with it from this point.   Let me know what you think.    Thank you [place]

## 2016-03-16 ENCOUNTER — Ambulatory Visit (INDEPENDENT_AMBULATORY_CARE_PROVIDER_SITE_OTHER): Payer: Medicare Other | Admitting: Neurology

## 2016-03-16 ENCOUNTER — Encounter (INDEPENDENT_AMBULATORY_CARE_PROVIDER_SITE_OTHER): Payer: Self-pay | Admitting: Neurology

## 2016-03-16 VITALS — BP 120/78 | HR 89 | Temp 98.6°F | Resp 16 | Ht 64.0 in | Wt 131.0 lb

## 2016-03-16 DIAGNOSIS — R252 Cramp and spasm: Secondary | ICD-10-CM

## 2016-03-16 DIAGNOSIS — H209 Unspecified iridocyclitis: Secondary | ICD-10-CM

## 2016-03-16 DIAGNOSIS — G35 Multiple sclerosis: Principal | ICD-10-CM

## 2016-03-16 MED ORDER — TIZANIDINE HCL 4 MG OR CAPS
4.0000 mg | ORAL_CAPSULE | Freq: Every evening | ORAL | 11 refills | Status: DC
Start: 2016-03-16 — End: 2016-09-21

## 2016-03-16 NOTE — Patient Instructions (Addendum)
1. Increase gabapentin as follows    100-100-300 for 5 days then  200-200-300 for 5 days then  300-300-300 for 5 days then  300-300-600 for 5 days then  600-300-600 for 5 days then  600-600-600 for 5 days then  [id number] for 5 days then  [id number] for 5 days then  [id number] and continue    2. Contact us for referral to [facility] for medical marijuana if gabapentin not helpful    3. Continue tysabri    4. Follow-up 3 months    5. Get MRI scans on CD

## 2016-03-16 NOTE — Progress Notes (Signed)
[facility]    First visit: 12/09/2015     Last visit: 12/09/15 Current visit: 03/16/16    Consultation Source: seen at [place], [place] and Christy Gentles, [email or web address] [phone]    Principle Neurological Diagnosis:   1. Clinically definite Multiple Sclerosis, relapsing form  2. Anterior uveitis  3. IgG related lymphadenopathy    Narrative History for Current Visit: Debra [place] is a 55 year old woman living in [place] for the past 2 years, but originally from [place], seen for 27 years by Dr. Lavon [place] at [place]. 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 Imaging studies were not available for review once again today. We reviewed the history and problem list in detail today which is documented in the following sections. The primary reason for her visit is to review her opthalmology evaluation of her uveitis. She switched her tysabri infusions to [place].    ROS remarkable for frequent mouth sores, lymphadenopathy (biopsy revealed IgG4 related lymphadenopathy) and anterior uveitis.    _____________________________________________________________________    MS Problem List:   1. Mobility (ND;range 0-6):none  2. Hand Function (ND;range 0-5):right hand clumsiness  3. Vision (ND; range 0-5):blurry peripheral vision   4. Fatigue ( ND; range 0-5):none  5. Cognition ( ND; range 0-5):episodes of word finding difficulties within past several years  6. Bowel/Bladder ( ND; range 0-5): rare bowel incontinence starting 10 years ago, infrequent urinary retention   7. Sensory ( ND; range 0-5): bilateral leg tingling and pain (right > left), rarely with arm tingling (right >>left)  8. Spasticity ( ND; range 0-5): bilateral legs, improved with yoga  **9. Pain ( ND; range 0-5): lower extremity tingling and burning pain    MS Performance Scale Score (sum of problem list  scores 1-9):  ND  (result ranges from 0 (normal) to 46 (maximal interference of symptoms on life))    10. Affective: (BDI II:  ND) longstanding history of depression  11. Social Support: lives with boyfriend, 3 daughters none present in [place]. Other symptoms or problems:  None  ______________________________________________________________________    MS History      Initial Symptoms (Date and Description): 1987 (~age 47), after delivery of first daughter, both legs would fall asleep during exercise  1988 grand mal seizure in doctor office  1989 another grand mal seizure in [place] while on vacation, was placed on tegretol for 2 years  1991 diagnosed with MS as she had more frequent episodes of fatigue, depression and numbness     Second Attack (Date and Description):     Subsequent relapses and course:    Relapses in Past 2 years: see above     Progressive Disease (approx onset and description): see above      Disease Modifying Treatment History: [Documented Needle phobia: No]    1. Betaseron (~ 1 year on therapy)  2. Copaxone ( <1 year) discontinued because it worsened depression  3. Fritzi Mandes (~3810 - Sep 11, 2015)    ______________________________________________________________________    Review of Systems: Complete 10 point review of symptoms from the patient self report questionnaire was reviewed with the patient today and scanned into the media tab. Any pertinent positives are listed above in the narrative history or problem list.    Allergies   Allergen Reactions   . Penicillins Rash       Current Outpatient Prescriptions  Medication Sig   . baclofen (LIORESAL) 10 MG tablet Take 1 tablet (10 mg) by mouth daily.   . diazepam (VALIUM) 5 MG tablet Take 1 tablet (5 mg) by mouth once a week.   . DULoxetine (CYMBALTA) 30 MG capsule Take 1 capsule (30 mg) by mouth daily.   Marland Kitchen gabapentin (NEURONTIN) 300 MG capsule Take 1 capsule (300 mg) by mouth at bedtime.   Marland Kitchen liothyronine (CYTOMEL) 25 MCG tablet Take 1  tablet (25 mcg) by mouth daily.   . nortriptyline (PAMELOR) 25 MG capsule Take 2 capsules (50 mg) by mouth nightly.   . sertraline (ZOLOFT) 100 MG tablet Take 1 tablet (100 mg) by mouth daily.   . tizanidine (ZANAFLEX) 4 MG capsule Take 1 capsule (4 mg) by mouth at bedtime.     No current facility-administered medications for this visit.        Past Medical History:   Diagnosis Date   . Hypothyroidism    . Major depressive disorder, single episode    . MS (multiple sclerosis) (CMS-HCC)      No past surgical history on file.    Family Medical History:  Family History   Problem Relation Age of Onset   . Other Mother    . Cancer Father    . Cancer Brother      ______________________________________________________________________    Examination:   BP 120/78 (BP Location: Left arm, BP Patient Position: Sitting, BP cuff size: Regular)  Pulse 89  Temp 98.6 F (37 C) (Oral)  Resp 16  Ht 5' 4"  (1.626 m)  Wt 59.4 kg (131 lb)  SpO2 98%  BMI 22.49 kg/m2  General: Examination of the skin, joints and extremities revealed no abnormalities  ;  There were no cervical, ocular or cranial bruits.     Cognitive/Behavioral: Examination of cognition, language and prosody revealed no abnormalities . Behavioral and affect was appropriate.    Cranial Nerves: Near visual acuity was 20/400 OD and J7 OS without correction.Visual fields were full  to confrontation testing. Lids were normal .  Pupils were 12m  and reactive with no  RAPD . Fundoscopic exam revealed no definite optic nerve pallor. Eyes were orthotropic with bilateral rotary nystagmus and left end point nystagmus.  Pursuit and saccadic eye movements revealed few beats of lateral nystagmus in both directions. Facial movements revealed no abnormalities. Testing of facial sensation revealed no abnormalities. Hearing was normal  AD and normal  AS. Bulbar examination revealed no abnormalities .     Motor: Upper extremity strength was as follows (R/L): Shoulder abduction 5/5,  Elbow extension 5/5, Wrist extension 5/5, Hand instrinsics 5/5. Rapid alternating movements were graded 1/1+. Spasticity ([place]) was rated 1/1. Lower extremity strength was as follows (R/L): Hip Flexors 5/5, Knee Flexors 5/5, Ankle dorsiflexors 5/5; Toe tapping was rating 1/1; Spasticity was rated 1+/1  There was no atrophy. There were no fasiculations.    Cerebellar: Finger to nose testing revealed no abnormalities  in the RUE and no abnormalities  in the LUE. Heel to shin testing revealed no abnormalities  in the RLE and no abnormalities  in the LLE. (see gait for description of midline/axial cerebellum dysfunction)    Sensory: Sensation to pp and temperature was normal. Vibration sensation duration (secs) was (R/L): Upper extremity middle finger ND/ND; Lower extremity Debra toe   Joint position sensation was normal .      Reflexes: (R/L): Biceps 2+/2+, BR 2+/2+, Triceps 2+/2+, Patella 2+/2+, Ankle 2/2. There was no clonus .  Gait Description: normal gait and station.  Ambulation was performed safely without  assistance. The patient was able to  tandem walk 8 steps without faltering.   ----------------------------------------------------------------------------------------------------------------------  Performance Measures:  No flowsheet data found.    No flowsheet data found.    No flowsheet data found.    No flowsheet data found.    No flowsheet data found.    No flowsheet data found.    ----------------------------------------------------------------------------------------------------------------------    Review of Imaging Studies:  Unavailable, last reportedly had MRI brain and spine January 2017    Review of Labs:  1. CSF reportedly with oligoclonal bands  JC virus 0.24  (09/11/15), 0.20 (01/02/16)    Assessment:   1.  Clinically definite Multiple Sclerosis, relapsing form, clinically stable    Atypical features (red flags) include: seizure at onset  Individual risk factors for disease activity and  worsening disability include: None known but no imaging studies available    2.  Neuropathic pain: non adequate trials of gabapentin so far    3.  Anxiety and depression: better    4. 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 IgG 4 related lymphadenopathy     After discussing the options available and the potential benefits (pros) and risks (cons), we mutually decided on the following management plan    Plan:  1. Increase gabapentin as follows    100-100-300 for 5 days then  200-200-300 for 5 days then  300-300-300 for 5 days then  300-300-600 for 5 days then  600-300-600 for 5 days then  600-600-600 for 5 days then  [phone] for 5 days then  [phone] for 5 days then  900-900-900 and continue    2. Contact us for referral to [facility] for medical marijuana if gabapentin not helpful    3. Get MRIs on CD to review    4. HLA B27 typing    Total time spent with the patient today was 50 minutes with over 50% of the time spent on education and counseling regarding their problem list and the assessment and plan

## 2016-03-26 ENCOUNTER — Ambulatory Visit
Admission: RE | Admit: 2016-03-26 | Discharge: 2016-03-26 | Disposition: A | Discharge status: Home / Self Care | Payer: Medicare Other | Attending: Neurology | Admitting: Neurology

## 2016-03-26 DIAGNOSIS — G35 Multiple sclerosis: Principal | ICD-10-CM | POA: Insufficient documentation

## 2016-03-26 DIAGNOSIS — H209 Unspecified iridocyclitis: Secondary | ICD-10-CM | POA: Insufficient documentation

## 2016-03-26 MED ORDER — SODIUM CHLORIDE 0.9 % IV SOLN
300.0000 mg | Freq: Once | INTRAVENOUS | Status: AC
Start: 2016-03-26 — End: 2016-03-26
  Administered 2016-03-26: 300 mg via INTRAVENOUS
  Filled 2016-03-26: qty 15

## 2016-03-26 MED ORDER — SODIUM CHLORIDE 0.9 % IV SOLN
INTRAVENOUS | Status: AC
Start: 2016-03-26 — End: 2016-03-26
  Administered 2016-03-26: 14:00:00 via INTRAVENOUS

## 2016-03-26 NOTE — Discharge Instructions (Signed)
Reviewed with patient medication side effects that could include the following:    TYSABRI    Possible side effects while using this medications:  Allergic reaction:   * Itching or hives, swelling in you face or hands, swelling or tingling in your mouth or throat, chest tightness, trouble breathing.  * Dark urine or pale stools, nausea, vomiting, loss of appetite, stomach pain, yellow skin or eyes.  * Fever, chills, cough, stuffy or running nose, sore throat, and body aches.  * Warmth or redness in your face, neck, arms, or upper chest, dizziness.  * Weakness on one side of your body, clumsiness, troubling seeing or vision changes, confusion, memory problems, unusual behavior.    Call your doctor right away if you notice any of these side effect.    If you notice these less serious side effect, talk to your doctor:  * Diarrhea or stomach upset.  * Headache  * Joint  or muscle pain  * Pain, itching, burning, swelling, or a lump under your skin where the needle is placed.  * Tiredness.    Patient stated awareness of the medication.

## 2016-03-26 NOTE — Interdisciplinary (Signed)
Non-Chemotherapy Infusion Nursing Note - [place]    Debra [place] is a 55 year old female who presents for infusion of TYSABRI    Vitals:    03/26/16 1346   BP: 132/78   BP Location: Left arm   BP Patient Position: Sitting   Pulse: 91   Resp: 16   Temp: 98.7 F (37.1 C)   TempSrc: Oral   SpO2: 99%   Weight: 61 kg (134 lb 8 oz)   Height: 5\' 4"  (1.626 m)     Pain Score: 0  Body surface area is 1.66 meters squared.  Body mass index is 23.09 kg/(m^2).    Pre-treatment nursing assessment:  No problems identified upon assessment.  Left Hand PIV obtained with 24 g Intima cath set, good blood return, samples obtained at 1400 and sent to lab for processing at 1405.  No lab parameters for today treatment.  NS 0.9% TKO continuous starting.  No pre-medications indicated for today's treatment.  Treatment: Touch program "pre-infusion Patient Checklist" completed, no new incidents or concerns expressed by patient.       natalizumab (TYSABRI) 300 mg in 100 ml NS over 60 minutes.     Flushed line with 20 ml NS.    [name] tolerated treatment well.    Post blood return: Brisk  Post-Flush: NS and Discontinued intact Intima cath set, pressure dressing applied.    Patient Education  Learner: Patient  Barriers to learning: No Barriers  Readiness to learn: Acceptance  Method: Explanation    Treatment Education: Information/teaching given to patient including: signs and symptoms of infection, bleeding, adverse reaction(s), symptom control, and when to notify MD.    [name]: Instructed patient to call for assistance.    Pain Education: Patient instructed to contact nurse if pain should develop or if their current pain therapy becomes ineffective.    Response: Verbalizes understanding    Discharge Plan  Discharge instructions given to patient.  Future appointments given and reviewed with treatment plan.  Discharge Mode: Ambulatory  Discharge Time: 1540  Accompanied by: Self  Discharged To: Home

## 2016-03-29 ENCOUNTER — Ambulatory Visit (HOSPITAL_BASED_OUTPATIENT_CLINIC_OR_DEPARTMENT_OTHER): Payer: Medicare Other

## 2016-04-01 LAB — HLA B27 TYPING

## 2016-04-23 ENCOUNTER — Ambulatory Visit (HOSPITAL_BASED_OUTPATIENT_CLINIC_OR_DEPARTMENT_OTHER): Admit: 2016-04-23 | Payer: Medicare Other

## 2016-04-26 ENCOUNTER — Ambulatory Visit (HOSPITAL_BASED_OUTPATIENT_CLINIC_OR_DEPARTMENT_OTHER): Payer: Medicare Other

## 2016-04-27 ENCOUNTER — Ambulatory Visit
Admission: RE | Admit: 2016-04-27 | Discharge: 2016-04-29 | Disposition: A | Discharge status: Home / Self Care | Payer: Medicare Other | Attending: Neurology | Admitting: Neurology

## 2016-04-27 DIAGNOSIS — G35 Multiple sclerosis: Principal | ICD-10-CM | POA: Insufficient documentation

## 2016-04-27 MED ORDER — PREDNISONE PO
ORAL | Status: DC
Start: ? — End: 2016-06-09

## 2016-04-27 MED ORDER — SODIUM CHLORIDE 0.9 % IV SOLN
INTRAVENOUS | Status: AC
Start: 2016-04-27 — End: 2016-04-27
  Administered 2016-04-27: 15:00:00 via INTRAVENOUS

## 2016-04-27 MED ORDER — NATALIZUMAB 300 MG/15ML IV CONC
300.00 mg | Freq: Once | INTRAVENOUS | Status: AC
Start: 2016-04-27 — End: 2016-04-27
  Administered 2016-04-27: 300 mg via INTRAVENOUS
  Filled 2016-04-27: qty 15

## 2016-04-27 NOTE — Interdisciplinary (Signed)
Non-Chemotherapy Infusion Nursing Note -   [place]    Charlei [place] is a 56 year old female who presents for infusion of Tysabri    Vitals:    04/27/16 1418   BP: 115/75   BP Location: Left arm   BP Patient Position: Sitting   Pulse: 87   Resp: 16   Temp: 98.1 F (36.7 C)   TempSrc: Oral   SpO2: 97%   Weight: 61.1 kg (134 lb 9.6 oz)   Height: 5\' 4"  (1.626 m)     Pain Score: 0  Body surface area is 1.66 meters squared.  Body mass index is 23.1 kg/(m^2).    Pre-treatment nursing assessment:  Patient is ambulatory, in no apparent distress.  She is feeling well. Does report she was diagnosed with uveitis and was given prednisone prescription taper.  Her neurologist Dr. Rosendo Gros is aware.  No reported worsening mobility, acute changes. TouchProgram questionnaire completed.  PIV placed on right arm. Good blood return obtained.    Tysabri 300 mg iv infused over 60 minutes.  [place] tolerated treatment well.    Post blood return: Brisk  Post-Flush: NS and Discontinued IV.  Patient declined to stay for observation period. Vitals is stable.    Patient Education  Learner: Patient  Barriers to learning: No Barriers  Readiness to learn: Acceptance  Method: Explanation    Treatment Education: Information/teaching given to patient including: signs and symptoms of infection, bleeding, adverse reaction(s), symptom control, and when to notify MD.    [place] Prevention Education: Instructed patient to call for assistance.    Pain Education: Patient instructed to contact nurse if pain should develop or if their current pain therapy becomes ineffective.    Response: Verbalizes understanding    Discharge Plan  Discharge instructions given to patient.  Future appointments given and reviewed with treatment plan.     Accompanied by: Self  Discharged To: Home

## 2016-04-30 ENCOUNTER — Encounter (INDEPENDENT_AMBULATORY_CARE_PROVIDER_SITE_OTHER): Payer: Self-pay | Admitting: Neurology

## 2016-04-30 NOTE — Telephone Encounter (Signed)
**Note De-Identified Debra Obfuscation** From: Debra Bolton  To: Arelia Sneddon, MD  Sent: 04/30/2016 12:56 PM PST  Subject: 1-Non Urgent Medical Advice     Just a quick note, I saw a rheumatologist in [facility] concerning the uveitis in my right eye. You had some concerns that it may be from the igG4 disease issue. She did not think so but we did decide to give steroids a chance to see if it would clear up my swollen lymph glands and help the uveitis. So I have been taking 5mg  tabs of prednisone (4 a day for a week, 3 a day for a week, 2,a day for a week. and finally one a day for a week) Since this is a change in my script, I am keeping you updated.      She also increased Duloxetine to 60.mg 2 times a day, thinking fibromyalgia might be involved and decreased sertraline to 1 tab of 100mg . Her thought being to offset the dul. increase and not offset the tysabri working.    If there is an issue with any of this please let me know.    As always thank you,  Debra Bolton [phone]

## 2016-05-25 ENCOUNTER — Ambulatory Visit (HOSPITAL_BASED_OUTPATIENT_CLINIC_OR_DEPARTMENT_OTHER): Admit: 2016-05-25 | Payer: Medicare Other

## 2016-05-27 ENCOUNTER — Ambulatory Visit
Admission: RE | Admit: 2016-05-27 | Discharge: 2016-05-28 | Disposition: A | Discharge status: Home / Self Care | Payer: Medicare Other | Attending: Neurology | Admitting: Neurology

## 2016-05-27 DIAGNOSIS — G35 Multiple sclerosis: Principal | ICD-10-CM | POA: Insufficient documentation

## 2016-05-27 MED ORDER — NATALIZUMAB 300 MG/15ML IV CONC
300.00 mg | Freq: Once | INTRAVENOUS | Status: AC
Start: 2016-05-27 — End: 2016-05-27
  Administered 2016-05-27: 300 mg via INTRAVENOUS
  Filled 2016-05-27: qty 15

## 2016-05-27 MED ORDER — SODIUM CHLORIDE 0.9 % IV SOLN
INTRAVENOUS | Status: AC
Start: 2016-05-27 — End: 2016-05-27
  Administered 2016-05-27: 15:00:00 via INTRAVENOUS

## 2016-05-27 NOTE — Interdisciplinary (Addendum)
Non-Chemotherapy Infusion Nursing Note -[place]    Debra Bolton is a 56 year old female who presents for infusion of tysabri 300 mg    Vitals:    05/27/16 1431   BP: 124/71   BP Location: Left arm   BP Patient Position: Sitting   Pulse: 102   Resp: 16   Temp: 98.6 F (37 C)   TempSrc: Oral   SpO2: 99%   Weight: 61.4 kg (135 lb 6.4 oz)   Height: 5\' 4"  (1.626 m)     Pain Score: 0  Body surface area is 1.67 meters squared.  Body mass index is 23.24 kg/(m^2).    Pre-treatment nursing assessment:  No problems identified upon assessment.    Debra Bolton tolerated treatment well.    Post blood return: Brisk  Post-Flush: NS and Discontinued IV    Patient Education  Learner: Patient  Barriers to learning: No Barriers  Readiness to learn: Acceptance  Method: Explanation and Handout    Treatment Education: Information/teaching given to patient including: signs and symptoms of infection, bleeding, adverse reaction(s), symptom control, and when to notify MD.    Debra Bolton Prevention Education: Instructed patient to call for assistance.    Pain Education: Patient instructed to contact nurse if pain should develop or if their current pain therapy becomes ineffective.    Response: Verbalizes understanding    Discharge Plan  Discharge instructions given to patient.  Future appointments given and reviewed with treatment plan.  Discharge Mode: Ambulatory  Accompanied by: Self  Discharged To: Home

## 2016-05-29 ENCOUNTER — Encounter (INDEPENDENT_AMBULATORY_CARE_PROVIDER_SITE_OTHER): Payer: Self-pay | Admitting: Neurology

## 2016-06-01 NOTE — Telephone Encounter (Signed)
From: [name]  To: Arelia Sneddon, MD  Sent: 05/29/2016 3:57 PM PST  Subject: 1-Non Urgent Medical Advice    Dear Dr Rosendo Gros,  Having issues with numbness and burning on right side of chest and breast, severe headache on left side of head and inner ear with sensitivity even if my hair is touched, radiates thru head and neck. Been in bed last 48 hours with a bag of frozen peas on head. Fun fun. Little or almost no sleep.    I am up to 2100 mg Nerontin ( kind of making me quite loopy, forgetful trouble connecting cognitively with answers peoples names...).    Obviously becoming depressed, just from pain alone. Any suggestions? ( last tysabri was on the 8th)  Thanks [name]

## 2016-06-02 ENCOUNTER — Encounter (HOSPITAL_BASED_OUTPATIENT_CLINIC_OR_DEPARTMENT_OTHER): Payer: Self-pay | Admitting: Student in an Organized Health Care Education/Training Program

## 2016-06-02 NOTE — Progress Notes (Signed)
Notified by infusion center that patient needs additional orders for Tysabri.    She continues on Tysabri q 4 weeks.     Last JCV Ab index 0.2 (12/2015)    Orders entered and signed per protocol.    Due for JCV Ab at next infusion visit. Added to orders

## 2016-06-03 NOTE — Telephone Encounter (Signed)
Pt scheduled 06/09/16 at 1130 per Dr. Aurea Graff. Pt verbalized understanding.

## 2016-06-09 ENCOUNTER — Encounter (HOSPITAL_BASED_OUTPATIENT_CLINIC_OR_DEPARTMENT_OTHER): Payer: Self-pay | Admitting: Neurology

## 2016-06-09 ENCOUNTER — Ambulatory Visit: Payer: Medicare Other | Attending: Neurology | Admitting: Neurology

## 2016-06-09 VITALS — BP 131/81 | HR 78 | Temp 98.1°F | Ht 64.0 in | Wt 135.0 lb

## 2016-06-09 DIAGNOSIS — M792 Neuralgia and neuritis, unspecified: Secondary | ICD-10-CM | POA: Insufficient documentation

## 2016-06-09 DIAGNOSIS — F329 Major depressive disorder, single episode, unspecified: Secondary | ICD-10-CM | POA: Insufficient documentation

## 2016-06-09 DIAGNOSIS — F32A Depression, unspecified: Secondary | ICD-10-CM

## 2016-06-09 DIAGNOSIS — G35 Multiple sclerosis: Secondary | ICD-10-CM | POA: Insufficient documentation

## 2016-06-09 NOTE — Patient Instructions (Addendum)
1.  Stop tizanidine  2.  Decrease the gabapentin to 300-300-600 for a week; then 300-600 for a week; then 600 only at night  3.  If head pains worsen contact us for carbamazepine   4.  Stop nortriptyline  5. If still feeling exhausted after stopping tizanidine and nortriptyline and decreasing the gabapentin then decrease cymbalta to bedtime only dose (60 mg)  6. Okay to take Diazepam as needed at bedtime for bad nights  7. Check JCV antibody status next infusion  8. MRI brain and cervical 3T scanner in next few months (before next visit)  9. Follow-up 4 months

## 2016-06-09 NOTE — Progress Notes (Signed)
[facility]    First visit: 12/09/2015     Last visit: 03/16/16     Current visit: 06/09/16    Consultation Source: seen at [facility], Lavon Paganini and Christy Gentles, [email or web address] [phone]    Principle Neurological Diagnosis:   1. Clinically definite Multiple Sclerosis, relapsing form  2. Anterior uveitis  3. IgG related lymphadenopathy    Narrative History for Current Visit: Debra Bolton is a 56 year old woman living in [place] for the past 2 years, but originally from [place], seen for 27 years by Dr. Lavon Paganini at [facility] [place]. 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   Continues tysabri at [employer] [facility]. Last tysabri on 05/27/16. Next scheduled for 06/24/16.   Opthalmology gave her 1 month rx for prednisone for uveitis Dec-January  She is taking Gabapentin 600 TID now. Unable to tolerate higher doses due to lethargy.  Has been having right parietal head and inner ear pain, worse when touches scalp. Previously had this while in [place] and tried indomethacin but without results. Self-resolved within a couple days previously, and had discussed possible botox with [facility] with Dr. Sharion Dove and Viann Shove.     ROS remarkable for frequent mouth sores, lymphadenopathy (biopsy revealed IgG4 related lymphadenopathy) and anterior uveitis.    _____________________________________________________________________    MS Problem List (** main issues today):   1. Mobility (ND;range 0-6):none  2. Hand Function (ND;range 0-5):right hand clumsiness  3. Vision (ND; range 0-5):blurry peripheral vision   **4. Fatigue ( ND; range 0-5): major issue  5. Cognition ( ND; range 0-5):episodes of word finding difficulties within past several years  6. Bowel/Bladder ( ND; range 0-5): rare bowel incontinence starting 10 years ago, infrequent urinary retention   7. Sensory ( ND; range 0-5): bilateral leg  tingling and pain (right > left), rarely with arm tingling (right >>left)  8. Spasticity ( ND; range 0-5): bilateral legs, improved with yoga  **9. 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 Affective: (BDI II:  ND) longstanding history of depression  11. Social Support: lives with boyfriend, 3 daughters none present in [place]. Other symptoms or problems:  None  ______________________________________________________________________    MS History      Initial Symptoms (Date and Description): 1987 (~age 74), after delivery of first daughter, both legs would fall asleep during exercise  1988 grand mal seizure in doctor office  1989 another grand mal seizure in [place] while on vacation, was placed on tegretol for 2 years  1991 diagnosed with MS as she had more frequent episodes of fatigue, depression and numbness     Second Attack (Date and Description):     Subsequent relapses and course:    Relapses in Past 2 years: see above     Progressive Disease (approx onset and description): see above      Disease Modifying Treatment History: [Documented Needle phobia: No]    1. Betaseron (~ 1 year on therapy)  2. Copaxone ( <1 year) discontinued because it worsened depression  3. Tysabri (~8841 - Sep 11, 2015) 12/2015 -  present Last tysabri on 05/27/16. Next scheduled for 06/24/16.   ______________________________________________________________________    Review of Systems: Complete 10 point review of symptoms from the patient self report questionnaire was reviewed with the patient today and scanned into the media tab. Any pertinent positives are listed above in the narrative history or problem list.    Allergies   Allergen Reactions    Penicillins Rash        Current Outpatient Prescriptions   Medication Sig    baclofen (LIORESAL) 10 MG tablet Take 1 tablet (10 mg) by mouth daily.    diazepam (VALIUM) 5 MG tablet Take 1 tablet (5 mg) by mouth once a week.    DULoxetine (CYMBALTA) 30 MG capsule Take 60 mg by mouth 2 times daily.      gabapentin (NEURONTIN) 300 MG capsule Take 600 mg by mouth 3 times daily.      liothyronine (CYTOMEL) 25 MCG tablet Take 25 mcg by mouth 3 times daily.      nortriptyline (PAMELOR) 25 MG capsule Take 2 capsules (50 mg) by mouth nightly.    sertraline (ZOLOFT) 100 MG tablet Take 50 mg by mouth daily.      tizanidine (ZANAFLEX) 4 MG capsule Take 1 capsule (4 mg) by mouth at bedtime.     No current facility-administered medications for this visit.        Past Medical History:   Diagnosis Date    Hypothyroidism     Major depressive disorder, single episode     MS (multiple sclerosis) (CMS-HCC)      No past surgical history on file.    Family Medical History:  Family History   Problem Relation Age of Onset    Other Mother     Cancer Father     Cancer Brother      ______________________________________________________________________    Examination:   BP 131/81 (BP Location: Left arm, BP Patient Position: Sitting, BP cuff size: Regular)   Pulse 78   Temp 98.1 F (36.7 C) (Oral)   Ht 5' 4"  (1.626 m)   Wt 61.2 kg (135 lb)   SpO2 100%   BMI 23.17 kg/m2  General: Examination of the skin, joints and extremities revealed no abnormalities  ;  There were no cervical, ocular or cranial bruits.     Cognitive/Behavioral: Examination of cognition, language and prosody revealed no abnormalities . Behavioral and affect was appropriate.    Cranial Nerves: Near visual acuity was 20/400 OD and J7 OS without correction.Visual fields were full  to confrontation testing. Lids were normal .  Pupils were 62m  and reactive with no  RAPD . Fundoscopic exam revealed no definite optic nerve pallor. Eyes were orthotropic with bilateral rotary nystagmus  and left end point nystagmus.  Pursuit and saccadic eye movements revealed few beats of lateral nystagmus in both directions. Facial movements revealed no abnormalities. Testing of facial sensation revealed no abnormalities. Hearing was normal  AD and normal  AS. Bulbar examination revealed no abnormalities .     Motor: Upper extremity strength was as follows (R/L): Shoulder abduction 5/5, Elbow extension 5/5, Wrist extension 5/5, Hand instrinsics 5/5. Rapid alternating movements were graded 1/1+. Spasticity ([place]) was rated 1/1. Lower extremity strength was as follows (R/L): Hip Flexors 5/5, Knee Flexors 5/5, Ankle dorsiflexors 5/5; Toe tapping was rating 1/1; Spasticity was rated 1+/1  There was no atrophy. There were no fasiculations.    Cerebellar: Finger to nose testing revealed no abnormalities  in the RUE and  no abnormalities  in the LUE. Heel to shin testing revealed no abnormalities  in the RLE and no abnormalities  in the LLE. (see gait for description of midline/axial cerebellum dysfunction)    Sensory: Sensation to pp and temperature was normal. Vibration sensation duration (secs) was (R/L): Upper extremity middle finger ND/ND; Lower extremity big toe   Joint position sensation was normal .      Reflexes: (R/L): Biceps 2+/2+, BR 2+/2+, Triceps 2+/2+, Patella 2+/2+, Ankle 2/2. There was no clonus .     Gait Description: normal gait and station.  Ambulation was performed safely without  assistance. The patient was able to  tandem walk 8 steps without faltering.   ----------------------------------------------------------------------------------------------------------------------  Performance Measures:  No flowsheet data found.    No flowsheet data found.    No flowsheet data found.    No flowsheet data found.    No flowsheet data found.    No flowsheet data found.    ----------------------------------------------------------------------------------------------------------------------    Review of  Imaging Studies:  1. MRI brain 06/21/15: Classic but minimal MS; T2 # 7 (Cerebellum, DWM, posterior PVWM), no atrophy      Review of Labs:  1. CSF reportedly with oligoclonal bands  JC virus 0.24  (09/11/15), 0.20 (01/02/16)  HLA B27 typing negative    Assessment:   1.  Clinically definite Multiple Sclerosis, relapsing form, clinically stable    Atypical features (red flags) include: seizure at onset  Individual risk factors for disease activity and worsening disability include: None known but no imaging studies available    2.  Neuropathic pain: over medicated with little benefit; need to adjust medications    3.  Anxiety and depression: better    4. Anterior uveitis: better    5. IgG 4 related lymphadenopathy    6. Severe fatigue; multifactorial with heavy component from sedating medications     After discussing the options available and the potential benefits (pros) and risks (cons), we mutually decided on the following management plan    Plan:  1.  Stop tizanidine  2.  Decrease the gabapentin to 300-300-600 then 300-600 then 600 only at night  3.  If head pains worsen contact us for carbamazepine   4.  Stop nortriptyline  5. If still feeling exhausted after stopping tizanidine and nortriptyline decrease cymbalta to bedtime only dose (60 mg)  6. Okay to take Diazepam as needed at bedtime for bad nights  7. Check JCV antibody status next infusion  8. MRI brain and cervical 3T scanner in next few months (before next visit)  9. Follow-up 4 months    Total time spent with the patient today was 50 minutes with over 50% of the time spent on education and counseling regarding their problem list and the assessment and plan

## 2016-06-09 NOTE — Progress Notes (Signed)
[facility] Pharmacy Note:    Date:  06/09/16   S: Debra Bolton is a 56 year old female here for urgent follow up.    Nausea, diarrhea, agitation, mania feels related to the gabapentin. Head pain is every few days. Can't move and puts frozen peas on it which dulls the pain. Has had this in the past and was treated with treated with  Depression seems worse- hopelessness, frustration.     More persistent headaches, debilitating in past few weeks.    Exhausted.     Orders/prescriptions up to date/adequate refills: yes      Medication reconciliation: Medication reconciliation was performed  Vitamin D 5,000 units daily: not discussed      Assessment/Plan:  1. MS: per Dr. Rosendo Gros

## 2016-06-15 ENCOUNTER — Encounter (INDEPENDENT_AMBULATORY_CARE_PROVIDER_SITE_OTHER): Payer: Medicare Other | Admitting: Neurology

## 2016-06-22 ENCOUNTER — Ambulatory Visit
Admission: RE | Admit: 2016-06-22 | Discharge: 2016-06-24 | Disposition: A | Discharge status: Home / Self Care | Payer: Medicare Other | Attending: Neurology | Admitting: Neurology

## 2016-06-22 DIAGNOSIS — G35 Multiple sclerosis: Principal | ICD-10-CM | POA: Insufficient documentation

## 2016-06-22 MED ORDER — SODIUM CHLORIDE 0.9 % IV SOLN
INTRAVENOUS | Status: DC
Start: 2016-06-22 — End: 2016-06-24
  Administered 2016-06-22: 15:00:00 via INTRAVENOUS

## 2016-06-22 MED ORDER — NATALIZUMAB 300 MG/15ML IV CONC
300.00 mg | Freq: Once | INTRAVENOUS | Status: AC
Start: 2016-06-22 — End: 2016-06-22
  Administered 2016-06-22: 300 mg via INTRAVENOUS
  Filled 2016-06-22: qty 15

## 2016-06-22 NOTE — Interdisciplinary (Signed)
Non-Chemotherapy Infusion Nursing Note - [place] is a 56 year old female who presents for infusion of tyasbri. One touch program done. Questionairre negative.     Vitals:    06/22/16 1416   BP: 125/71   BP Location: Left arm   BP Patient Position: Sitting   Pulse: 72   Resp: 16   Temp: 98.5 F (36.9 C)   TempSrc: Oral   SpO2: 99%   Weight: 61.7 kg (136 lb)   Height: 5\' 4"  (1.626 m)     Pain Score: 0  Body surface area is 1.67 meters squared.  Body mass index is 23.34 kg/(m^2).    Pre-treatment nursing assessment:  Patient denied any new medications or symptoms. Peripheral IV inserted in left lower arm. Labs drawn. Applied occlusive dressing.      Debra Bolton tolerated treatment well.    Post blood return: Brisk  Post-Flush: NS    Patient Education  Learner: Patient  Barriers to learning: No Barriers  Readiness to learn: Acceptance  Method: Explanation    Treatment Education: Information/teaching given to patient including: signs and symptoms of infection, bleeding, adverse reaction(s), symptom control, and when to notify MD.    [name]: Instructed patient to call for assistance.    Pain Education: Patient instructed to contact nurse if pain should develop or if their current pain therapy becomes ineffective.    Response: Verbalizes understanding    Discharge Plan  Discharge instructions given to patient.  Future appointments given and reviewed with treatment plan.  Discharge Mode: Ambulatory  Discharge Time: [name] by: Self  Discharged To: Home

## 2016-06-24 ENCOUNTER — Ambulatory Visit (HOSPITAL_BASED_OUTPATIENT_CLINIC_OR_DEPARTMENT_OTHER): Payer: Medicare Other

## 2016-06-30 LAB — JCV AB (WITH INDEX) WITH/REFLEX TO INHIBITION ASSAY

## 2016-07-07 ENCOUNTER — Encounter (INDEPENDENT_AMBULATORY_CARE_PROVIDER_SITE_OTHER): Payer: Self-pay | Admitting: Neurology

## 2016-07-07 DIAGNOSIS — G35 Multiple sclerosis: Secondary | ICD-10-CM

## 2016-07-09 NOTE — Telephone Encounter (Signed)
From: [name]  To: Arelia Sneddon, MD  Sent: 07/07/2016 8:59 PM PDT  Subject: 1-Non Urgent Medical Advice    Dear Dr. Rosendo Gros and Dr. Glennon Hamilton,    I have eliminated the tizanidine, sertraline, and nortriptyline from my medications. I am on 900 gabapentin. 300 in the am and 600 at night. I am also taking 60 mg of cymbalta 2x a day. I take the valium 1-2 at night almost every night.     The burning and tingling and stabbing like pain is almost always constant.    So I am less tired, a lot less tired, but now always in pain, I am depressed form feeling pain but certainly not in a suicidal tendency just from the relentlessness of it all, .I believe in the past it was the tricyclic antidepressants that helped with this pain the most.  I have been trying to wait out whatever my body is doing, but it has been a month and i have little peace. I am also itchy a lot and my urine seems to be darkening.    I asked Dr. Aurea Graff for a referral to an internist (preferably a woman) thinking maybe I need to work on this end of healing because something is not right, But forgot to get a name. Any help would be appreciated.    Almost broken, please help.    [name] [phone]

## 2016-07-16 ENCOUNTER — Inpatient Hospital Stay (INDEPENDENT_AMBULATORY_CARE_PROVIDER_SITE_OTHER)
Admit: 2016-07-16 | Discharge: 2016-07-16 | Disposition: A | Discharge status: Home Health | Payer: Medicare Other | Attending: Neurology | Admitting: Neurology

## 2016-07-16 DIAGNOSIS — G35 Multiple sclerosis: Principal | ICD-10-CM

## 2016-07-16 MED ORDER — GADOBUTROL 1 MMOL/ML IV SOLN
5.00 mL | Freq: Once | INTRAVENOUS | Status: AC
Start: 2016-07-16 — End: 2016-07-16
  Administered 2016-07-16: 5 mL via INTRAVENOUS

## 2016-07-16 NOTE — Telephone Encounter (Signed)
Pls advise.  

## 2016-07-19 NOTE — Addendum Note (Signed)
Addended byGlennon Hamilton on: 07/19/2016 01:55 PM     Modules accepted: Orders

## 2016-07-20 ENCOUNTER — Ambulatory Visit
Admission: RE | Admit: 2016-07-20 | Discharge: 2016-07-21 | Disposition: A | Discharge status: Home / Self Care | Payer: Medicare Other | Attending: Family | Admitting: Family

## 2016-07-20 DIAGNOSIS — G35 Multiple sclerosis: Principal | ICD-10-CM | POA: Insufficient documentation

## 2016-07-20 MED ORDER — NATALIZUMAB 300 MG/15ML IV CONC
300.00 mg | Freq: Once | INTRAVENOUS | Status: AC
Start: 2016-07-20 — End: 2016-07-20
  Administered 2016-07-20: 300 mg via INTRAVENOUS
  Filled 2016-07-20: qty 15

## 2016-07-20 MED ORDER — SODIUM CHLORIDE 0.9 % IV SOLN
INTRAVENOUS | Status: AC
Start: 2016-07-20 — End: 2016-07-20
  Administered 2016-07-20: 15:00:00 via INTRAVENOUS

## 2016-07-20 NOTE — Interdisciplinary (Signed)
Non-Chemotherapy Infusion Nursing Note - Debra Bolton is a 56 year old female who presents for infusion of tysabri.     Vitals:    07/20/16 1440   BP: 113/83   BP Patient Position: Sitting   Pulse: 84   Temp: 98.4 F (36.9 C)   TempSrc: Oral   SpO2: 98%   Weight: 58.5 kg (129 lb)   Height: 5\' 4"  (1.626 m)     Pain Score: 6  Body surface area is 1.63 meters squared.  Body mass index is 22.14 kg/(m^2).    Pre-treatment nursing assessment:  Patient denied any new symptoms or medications. One touch program done. Questionnaire negative.     Debra Bolton tolerated treatment well.    Post blood return: Brisk  Post-Flush: NS    Patient Education  Learner: Patient  Barriers to learning: No Barriers  Readiness to learn: Acceptance  Method: Explanation    Treatment Education: Information/teaching given to patient including: signs and symptoms of infection, bleeding, adverse reaction(s), symptom control, and when to notify MD.    Debra Bolton Prevention Education: Instructed patient to call for assistance.    Pain Education: Patient instructed to contact nurse if pain should develop or if their current pain therapy becomes ineffective.    Response: Verbalizes understanding    Discharge Plan  Discharge instructions given to patient.  Future appointments given and reviewed with treatment plan.  Discharge Mode: Ambulatory  Discharge Time: 1630  Accompanied by: Self  Discharged To: Home

## 2016-07-22 ENCOUNTER — Encounter (INDEPENDENT_AMBULATORY_CARE_PROVIDER_SITE_OTHER): Payer: Self-pay | Admitting: Neurology

## 2016-07-27 ENCOUNTER — Encounter (INDEPENDENT_AMBULATORY_CARE_PROVIDER_SITE_OTHER): Payer: Medicare Other | Admitting: Neurology

## 2016-07-27 ENCOUNTER — Other Ambulatory Visit: Payer: Self-pay | Admitting: Neurology

## 2016-07-28 NOTE — Telephone Encounter (Signed)
From: [name]  To: Arelia Sneddon, MD  Sent: 07/22/2016 1:25 PM PDT  Subject: 1-Non Urgent Medical Advice    Dear Dr. Aurea Graff,   Thanks for your help!  Is there somewhere I need to call to requests appts. with the internist and pain clinic, or will they contact me? I have dropped 10 pounds and am still fairly miserable.   Have you seen my MRI results and do we have any more information?  I have an old appt. set for April 10, should I cancel that and just see you again in June 21st, as planned during my last visit.    Thanks for the information  Elizabeth Paulsen [phone]  ----- Message -----  From: Bryn Gulling, DO  Sent: 07/19/2016 1:56 PM PDT  To: [name]  Subject: RE: 1-Non Urgent Medical Advice    Hi Maritssa,  I hope you are doing better, if not the same. I have reordered a referral for you to see someone in the [facility] internal medicine department in [place] because that is the closest [facility] clinic to you.  I also sent a referral to the pain clinic to hopefully get some recommendations on how to better manage your pain.  Best wishes,  Dr. Aurea Graff    ----- Message -----   From: [name]   Sent: 07/07/2016 8:59 PM PDT   To: Arelia Sneddon, MD  Subject: 1-Non Urgent Medical Advice    Dear Dr. Rosendo Gros and Dr. Glennon Hamilton,    I have eliminated the tizanidine, sertraline, and nortriptyline from my medications. I am on 900 gabapentin. 300 in the am and 600 at night. I am also taking 60 mg of cymbalta 2x a day. I take the valium 1-2 at night almost every night.     The burning and tingling and stabbing like pain is almost always constant.    So I am less tired, a lot less tired, but now always in pain, I am depressed form feeling pain but certainly not in a suicidal tendency just from the relentlessness of it all, .I believe in the past it was the tricyclic antidepressants that helped with this pain the most.  I have been trying to wait out whatever my body is doing, but it has been a month and i  have little peace. I am also itchy a lot and my urine seems to be darkening.    I asked Dr. Aurea Graff for a referral to an internist (preferably a woman) thinking maybe I need to work on this end of healing because something is not right, But forgot to get a name. Any help would be appreciated.    Almost broken, please help.    [name] [phone]

## 2016-08-09 ENCOUNTER — Encounter (HOSPITAL_BASED_OUTPATIENT_CLINIC_OR_DEPARTMENT_OTHER): Payer: Self-pay | Admitting: Student in an Organized Health Care Education/Training Program

## 2016-08-09 ENCOUNTER — Telehealth (HOSPITAL_BASED_OUTPATIENT_CLINIC_OR_DEPARTMENT_OTHER): Payer: Self-pay | Admitting: Neurology

## 2016-08-09 NOTE — Telephone Encounter (Signed)
Appointment made for 5/7 at 11:15am for tysabri.   Please update treatment plan as there is no more orders.

## 2016-08-09 NOTE — Progress Notes (Signed)
Notified by infusion center that patient needs additional orders for Tysabri.    She continues on Tysabri q 4 weeks.     Last JCV Ab index 0.21 (06/2016)    Orders entered and signed per protocol.

## 2016-08-09 NOTE — Telephone Encounter (Signed)
Pt called to schedule an infusion center appointment.  Request forwarded to Infusion Schedule Coordinators.  Pt's phone: [phone]

## 2016-08-19 ENCOUNTER — Ambulatory Visit: Payer: Medicare Other | Attending: Pain Medicine | Admitting: Pain Medicine

## 2016-08-19 ENCOUNTER — Encounter (HOSPITAL_BASED_OUTPATIENT_CLINIC_OR_DEPARTMENT_OTHER): Payer: Self-pay | Admitting: Pain Medicine

## 2016-08-19 VITALS — BP 109/78 | HR 98 | Temp 98.6°F

## 2016-08-19 DIAGNOSIS — G890 Central pain syndrome: Principal | ICD-10-CM | POA: Insufficient documentation

## 2016-08-19 DIAGNOSIS — F3289 Other specified depressive episodes: Secondary | ICD-10-CM | POA: Insufficient documentation

## 2016-08-19 NOTE — Patient Instructions (Signed)
Pain psychology evaluation with Dr. Warnell Bureau schedule on your way out    We discussed a clinical study. If you are interested or have questions, please call [phone] or email Phirum Alfonse Spruce at [email or web address].    Recommendations:  - Pain psychiatry evaluation with Dr. Madolyn Frieze  - Pain psychology evaluation with Dr. Warnell Bureau schedule on your way out  - Continue nortriptyline 50mg , with Dr. Madolyn Frieze possibly increase it further  - If you'd like, can restart tizanidine 4mg  nightly, may be able to use instead of valium.  - If the above isn't helpful, would recommend oxcarbazepine.  Oxcarbazepine (brand name TRILEPTAL) is an anti-seizure drug which is used off-label for neuropathic pain. It has been on the market since 2000.   The most common side effects are dizziness, drowsiness, abdominal discomfort and low blood sodium levels.   With any antiseizure or antidepressant medicine, we caution that if you start to think about suicide, please stop the medicine and go to the nearest emergency room.   It can rarely cause a dangerous rash; if you develop a rash, please stop it.   If you have a history of heart problems, seizures, glaucoma, or liver or kidney problems, please discuss as this might not be the right medicine for you.   Prior to starting this medicine, your blood sodium level, kidney function, and liver function should be checked. You will need these to be checked once a year while on oxcarbazepine.     Oxcarbazepine can decrease the effectiveness of birth control pills and can increase the risk of breakthrough bleeding and becoming pregnant despite taking birth control pills; using medium- or high-dose oral contraceptives will decrease the risk of becoming pregnant while using oxcarbazepine.     Recent research about oxcarbazepine for spinal cord and nerve pain:    Symptom-Based Treatment of Neuropathic Pain in Spinal Cord-Injured Patients: A Randomized Crossover Clinical Trial.   Min K1, Georgeanna Lea, Ryu  JS.  Am J Phys Med Rehabil. 2016 May;95(5):330-8. doi: 10.1097/PHM.[id number].     The effect of oxcarbazepine in peripheral neuropathic pain depends on pain phenotype: A randomised, double-blind, placebo-controlled phenotype-stratified study  Demant, Dyveke T.a; Burley Saver; Vollert, [place]; [place], Christophc; [place], Mrthad,e; [place], Nanna B.b; [place], Troels S.b; [place], S?ren H.a,*  PAIN: November 2014 - Volume 155 - Issue 11 - p 2263-2273   doi: 10.1016/[email or web address].2014.08.014  - If all of this isn't helpful, there's other options as well we can discuss.
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
## 2016-08-20 ENCOUNTER — Telehealth (INDEPENDENT_AMBULATORY_CARE_PROVIDER_SITE_OTHER): Payer: Self-pay | Admitting: Psychiatry

## 2016-08-20 NOTE — Telephone Encounter (Signed)
Good Morning Debra Bolton,  I scheduled this patient for Pain with Dr. Madolyn Frieze and wanted to see if there is any sooner appts for Dr.Papps specialty. Thank you.      [place]

## 2016-08-20 NOTE — Telephone Encounter (Signed)
Called pt back and offered appt on 08/24/16 at 10:30am with Dr. Madolyn Frieze. Pt confirmed appt.     Closing this encounter.     Thanks Pricila!

## 2016-08-23 ENCOUNTER — Ambulatory Visit
Admission: RE | Admit: 2016-08-23 | Discharge: 2016-08-24 | Disposition: A | Discharge status: Home / Self Care | Payer: Medicare Other | Attending: Neurology | Admitting: Neurology

## 2016-08-23 DIAGNOSIS — G35 Multiple sclerosis: Principal | ICD-10-CM | POA: Insufficient documentation

## 2016-08-23 MED ORDER — NATALIZUMAB 300 MG/15ML IV CONC
300.00 mg | Freq: Once | INTRAVENOUS | Status: AC
Start: 2016-08-23 — End: 2016-08-23
  Administered 2016-08-23: 300 mg via INTRAVENOUS
  Filled 2016-08-23: qty 15

## 2016-08-23 MED ORDER — SODIUM CHLORIDE 0.9 % IV SOLN
INTRAVENOUS | Status: DC
Start: 2016-08-23 — End: 2016-08-24
  Administered 2016-08-23: 11:00:00 via INTRAVENOUS

## 2016-08-23 NOTE — Interdisciplinary (Signed)
Non-Chemotherapy Infusion Nursing Note -  Debra Bolton is a 56 year old female who presents for infusion of Tysabri.  PT IS ALERT AND AMBULATORY.  PT HAS NO DISTRESS OR COMPLAINTS OF ANY DISCOMFORT.  PT HAS NO REPORT OF FEVER OR CHILL.  PT HAS NO SHORTNESS OF BREATH.     Vitals:    08/23/16 1059   BP: 117/76   BP Location: Left arm   BP Patient Position: Sitting   Pulse: 92   Resp: 16   Temp: 98.5 F (36.9 C)   TempSrc: Oral   SpO2: 99%   Weight: 59 kg (130 lb)   Height: 5\' 4"  (1.626 m)     Pain Score: 0  Body surface area is 1.63 meters squared.  Body mass index is 22.31 kg/(m^2).    Pre-treatment nursing assessment:  No problems identified upon assessment.  Touch program updated.  JC Virus was undetermined on 06/22/16.    Hartlee Jayson tolerated Tysabri 300mg  Iv over an hour treatment well.    Post blood return: Brisk  Post-Flush: NS and Discontinued IV    Patient Education  Learner: Patient  Barriers to learning: No Barriers  Readiness to learn: Acceptance  Method: Explanation    Treatment Education: Information/teaching given to patient including: signs and symptoms of infection, bleeding, adverse reaction(s), symptom control, and when to notify MD.    [name]: Instructed patient to call for assistance.    Pain Education: Patient instructed to contact nurse if pain should develop or if their current pain therapy becomes ineffective.    Response: Verbalizes understanding    Discharge Plan  Discharge instructions given to patient.  Future appointments given and reviewed with treatment plan.  Discharge Mode: Ambulatory  Discharge Time: 1240  Accompanied by: Self  Discharged To: Home

## 2016-08-24 ENCOUNTER — Ambulatory Visit (INDEPENDENT_AMBULATORY_CARE_PROVIDER_SITE_OTHER): Payer: Medicare Other | Admitting: Psychiatry

## 2016-08-24 ENCOUNTER — Encounter (INDEPENDENT_AMBULATORY_CARE_PROVIDER_SITE_OTHER): Payer: Self-pay | Admitting: Psychiatry

## 2016-08-24 VITALS — BP 122/73 | HR 101 | Ht 64.0 in | Wt 130.6 lb

## 2016-08-24 DIAGNOSIS — G894 Chronic pain syndrome: Secondary | ICD-10-CM

## 2016-08-24 DIAGNOSIS — F4310 Post-traumatic stress disorder, unspecified: Secondary | ICD-10-CM

## 2016-08-24 DIAGNOSIS — F339 Major depressive disorder, recurrent, unspecified: Secondary | ICD-10-CM

## 2016-08-24 DIAGNOSIS — G35 Multiple sclerosis: Principal | ICD-10-CM

## 2016-08-24 MED ORDER — DULOXETINE HCL 30 MG OR CPEP
90.0000 mg | ORAL_CAPSULE | Freq: Every day | ORAL | 0 refills | Status: DC
Start: 2016-08-24 — End: 2016-09-21
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
## 2016-08-27 ENCOUNTER — Telehealth (INDEPENDENT_AMBULATORY_CARE_PROVIDER_SITE_OTHER): Payer: Self-pay | Admitting: Psychiatry

## 2016-08-27 NOTE — Telephone Encounter (Signed)
PA approval came in for Duloxetine 30 mg (90 tabs total)  Good until 08/2017. Faxed to pharmacy- [facility] number [id number]. Called pt to notify, left msg.    Auth scanned in to chart.  Florene Route

## 2016-09-07 ENCOUNTER — Telehealth (HOSPITAL_BASED_OUTPATIENT_CLINIC_OR_DEPARTMENT_OTHER): Payer: Self-pay | Admitting: Neurology

## 2016-09-07 NOTE — Telephone Encounter (Signed)
Pt called our [facility] office to r/s upcoming apt for Tysabri on 09/20/16. We were able to r/s apt to 10/01/16 at 11 a.m.

## 2016-09-20 ENCOUNTER — Ambulatory Visit (HOSPITAL_BASED_OUTPATIENT_CLINIC_OR_DEPARTMENT_OTHER): Payer: Medicare Other

## 2016-09-21 ENCOUNTER — Ambulatory Visit (INDEPENDENT_AMBULATORY_CARE_PROVIDER_SITE_OTHER): Payer: Medicare Other | Admitting: Psychiatry

## 2016-09-21 ENCOUNTER — Encounter (INDEPENDENT_AMBULATORY_CARE_PROVIDER_SITE_OTHER): Payer: Self-pay | Admitting: Psychiatry

## 2016-09-21 ENCOUNTER — Telehealth (INDEPENDENT_AMBULATORY_CARE_PROVIDER_SITE_OTHER): Payer: Self-pay | Admitting: Psychiatry

## 2016-09-21 VITALS — BP 114/65 | HR 86 | Resp 16 | Ht 64.0 in | Wt 131.0 lb

## 2016-09-21 DIAGNOSIS — G894 Chronic pain syndrome: Secondary | ICD-10-CM

## 2016-09-21 DIAGNOSIS — F339 Major depressive disorder, recurrent, unspecified: Secondary | ICD-10-CM

## 2016-09-21 DIAGNOSIS — R252 Cramp and spasm: Secondary | ICD-10-CM

## 2016-09-21 DIAGNOSIS — G35 Multiple sclerosis: Secondary | ICD-10-CM

## 2016-09-21 DIAGNOSIS — F4310 Post-traumatic stress disorder, unspecified: Principal | ICD-10-CM

## 2016-09-21 MED ORDER — DULOXETINE HCL 60 MG OR CPEP
120.0000 mg | ORAL_CAPSULE | Freq: Every day | ORAL | 1 refills | Status: DC
Start: 2016-09-21 — End: 2017-01-12

## 2016-09-21 MED ORDER — TIZANIDINE HCL 4 MG OR CAPS
8.0000 mg | ORAL_CAPSULE | Freq: Every evening | ORAL | 1 refills | Status: DC
Start: 2016-09-21 — End: 2016-12-30

## 2016-09-21 NOTE — Telephone Encounter (Signed)
Rcvd fax from [facility] [phone] request medication change    From Tizananidine to Carisoprodol or Methocarbamol     Due to ins does not cover Tizananidine    Please advise,     Caryl Pina
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
## 2016-09-22 ENCOUNTER — Encounter (INDEPENDENT_AMBULATORY_CARE_PROVIDER_SITE_OTHER): Payer: Self-pay | Admitting: Psychiatry

## 2016-09-22 NOTE — Telephone Encounter (Signed)
Will do PA. These meds are not interchangeable. (Routing comment)     ----------------------------------------------------------------------------------    Called Pharmacy to start PA process, they said if you change from cap to tab- it will be covered    Also pt is not due to pick it up until 6/14 due to prev Rx from a different Dr    Please advise,    Caryl Pina

## 2016-09-23 ENCOUNTER — Encounter (INDEPENDENT_AMBULATORY_CARE_PROVIDER_SITE_OTHER): Payer: Self-pay | Admitting: Psychiatry

## 2016-09-23 DIAGNOSIS — M542 Cervicalgia: Secondary | ICD-10-CM | POA: Diagnosis not present

## 2016-09-23 DIAGNOSIS — F1120 Opioid dependence, uncomplicated: Secondary | ICD-10-CM | POA: Diagnosis not present

## 2016-09-23 DIAGNOSIS — Z79891 Long term (current) use of opiate analgesic: Secondary | ICD-10-CM | POA: Diagnosis not present

## 2016-09-23 DIAGNOSIS — G5600 Carpal tunnel syndrome, unspecified upper limb: Secondary | ICD-10-CM | POA: Diagnosis not present

## 2016-09-23 DIAGNOSIS — M546 Pain in thoracic spine: Secondary | ICD-10-CM | POA: Diagnosis not present

## 2016-09-23 DIAGNOSIS — M5136 Other intervertebral disc degeneration, lumbar region: Secondary | ICD-10-CM | POA: Diagnosis not present

## 2016-09-23 DIAGNOSIS — G894 Chronic pain syndrome: Secondary | ICD-10-CM | POA: Diagnosis not present

## 2016-09-23 DIAGNOSIS — Z720 Tobacco use: Secondary | ICD-10-CM | POA: Diagnosis not present

## 2016-09-23 DIAGNOSIS — Z8781 Personal history of (healed) traumatic fracture: Secondary | ICD-10-CM | POA: Diagnosis not present

## 2016-09-23 NOTE — Telephone Encounter (Signed)
From: [name]  To: Deatra James, MD  Sent: 09/23/2016 10:14 AM PDT  Subject: VZ:CHYI    Thank you for checking into this for me, I appreciate your help.  [name]  ----- Message -----  From: Deatra James, MD  Sent: 09/22/2016 3:02 PM PDT  To: [name]  Subject: EMDR    Dear Shauna Hugh, unfortunately the EMDR program does not take patients with Medicare. We'll discuss other options .    AP

## 2016-09-24 ENCOUNTER — Ambulatory Visit (INDEPENDENT_AMBULATORY_CARE_PROVIDER_SITE_OTHER): Payer: Medicare Other | Admitting: Family Practice

## 2016-09-24 ENCOUNTER — Other Ambulatory Visit (INDEPENDENT_AMBULATORY_CARE_PROVIDER_SITE_OTHER): Payer: Self-pay

## 2016-09-24 ENCOUNTER — Encounter (INDEPENDENT_AMBULATORY_CARE_PROVIDER_SITE_OTHER): Payer: Self-pay | Admitting: Family Practice

## 2016-09-24 VITALS — BP 132/84 | HR 96 | Temp 98.9°F | Resp 18 | Ht 64.0 in | Wt 130.3 lb

## 2016-09-24 DIAGNOSIS — R490 Dysphonia: Secondary | ICD-10-CM

## 2016-09-24 DIAGNOSIS — Z1389 Encounter for screening for other disorder: Principal | ICD-10-CM

## 2016-09-24 DIAGNOSIS — N644 Mastodynia: Secondary | ICD-10-CM

## 2016-09-24 DIAGNOSIS — M797 Fibromyalgia: Secondary | ICD-10-CM

## 2016-09-24 DIAGNOSIS — M25511 Pain in right shoulder: Secondary | ICD-10-CM

## 2016-09-24 DIAGNOSIS — E039 Hypothyroidism, unspecified: Secondary | ICD-10-CM

## 2016-09-24 DIAGNOSIS — D8989 Other specified disorders involving the immune mechanism, not elsewhere classified: Secondary | ICD-10-CM

## 2016-09-24 DIAGNOSIS — H209 Unspecified iridocyclitis: Secondary | ICD-10-CM

## 2016-09-24 DIAGNOSIS — R195 Other fecal abnormalities: Secondary | ICD-10-CM

## 2016-09-24 DIAGNOSIS — Z0000 Encounter for general adult medical examination without abnormal findings: Secondary | ICD-10-CM

## 2016-09-24 DIAGNOSIS — M359 Systemic involvement of connective tissue, unspecified: Secondary | ICD-10-CM

## 2016-09-24 DIAGNOSIS — G8929 Other chronic pain: Secondary | ICD-10-CM

## 2016-09-24 MED ORDER — GABAPENTIN 300 MG OR CAPS
600.00 mg | ORAL_CAPSULE | Freq: Every evening | ORAL | Status: DC
Start: 2016-09-24 — End: 2018-05-02

## 2016-09-24 MED ORDER — THYROID 60 MG OR TABS
60.0000 mg | ORAL_TABLET | Freq: Every day | ORAL | 3 refills | Status: DC
Start: 2016-09-24 — End: 2017-07-22

## 2016-09-24 MED ORDER — THYROID 15 MG OR TABS
15.0000 mg | ORAL_TABLET | Freq: Every day | ORAL | 2 refills | Status: DC
Start: 2016-09-24 — End: 2017-03-29

## 2016-09-24 NOTE — Interdisciplinary (Signed)
Pre-visit chart review and huddle completed with staff and physician.    Outstanding labs, imaging and consults reviewed and identified.    Health maintanence issues identified and addressed:    Health Maintenance   Topic Date Due    IMM_TD/TDAP=>56 YO  12/16/1971    CERVICAL CANCER SCREENING  12/17/1979    BREAST CANCER SCREENING  12/17/1998    COLON CANCER SCREENING WITH COLONOSCOPY  12/16/2008    Hepatitis C Screening  12/17/2010    INFLUENZA VACCINE  11/17/2016    PHQ9 Depression Monitoring doc flowsheet  02/24/2017

## 2016-09-24 NOTE — Patient Instructions (Addendum)
My name is Dahlila Pfahler Engineer, manufacturing that was caring for you today. If you have any questions or concerns regarding today's visit, please feel free to give me a call at [phone]. If you receive a survey in the mail, we appreciate your time in completing the survey.   Thank you for choosing [facility] for all your healthcare needs!! :-)      [facility] Hours       [address]    Monday - Friday 8am-11:30am (closed for lunch) 1:00pm-4:30pm       Radiology [facility]   [place], [place] [address]                      Please call your ophthalmologist ASAP to tell them your symptoms of flashes.   Check your stool for any possible infection that may be happening.  Please see rheumatologist.   Please get an X-ray of your shoulder.  Get a mammogram of breast.  Please get blood work and stool samples. Please get your fasting blood work done (no food 8 hours prior but you should drink water).    Please do regular aerobic exercise to help with fibromyalgia pain.  If get worse flashes of light or feel curtain going over your vision please go the ER.  Try acupuncture and cupping to alleviate fibromyalgia and shoulder/neck pain.   See [facility], [place], [place] [address]    Please follow up with me for your pap smear in 1-2 months

## 2016-09-24 NOTE — Progress Notes (Signed)
SUBJECTIVE:  Debra Bolton is a 56 year old female here for multiple issues and to establish care    Moved here from [place] a few years ago, was still receiving care at [facility] and flying back until recently. Has established care with a neurologist here at [place]. Has also seen psychiatry and been to pain clinic.  Would like to find PCP to help coordinate her care.    1. MS  She sees a neurologist that she saw last Feb. She has trouble swallowing sometimes. Denies vomiting recently. Was diagnosed with MS around 1990.      2. Diarrhea  Started in April, occurs very frequently and consistently. 3 bowel movements/day. When she gets it she feels like she has the flu. Last time this happened was 1 wk ago. In the last 2 months, has happened 4x. Denies bloody stool, black tarry stool. Has been having a lot of diarrhea recently in the last few months, even having diarrhea in bed No nausea or vomiting. No blood in the stool, no melena. Watery brown, not greasy. Describes it as "explosive", can't make it to the bathroom. Has not tried taking anything for the diarrhea. No pain while passing the diarrhea. Had a colonoscopy at 61, no abnormal findings. Reports some diverticular disease. Has a hiatal hernia, doesn't have GERD symptoms.    3. R Shoulder Pain:  Ongoing for 9 months. Denies falls, trauma. She exercises regularly, believes she did something new to agitate it. She has this pain when she goes to sleep, it will sometimes wake her up. Pain is like a "knife in shoulder". Tylenol does not alleviate it. Last pain occurrence was 4 days ago. She has a lot of stress in her neck. She has not seen an ENT doctor yet. Pt does yoga to try and stretch it. No past injuries to that shoulder. Doesn't feel like ice, heat, or resting it help.     4. Fibromyalgia related Pain:  Has not seen a rheum in [place] before. Has tried acupuncture before but saw no difference. Was in hospital in February to have lymph nodes and tonsil  out due to concern for oral cancer. 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 Throat has been raspy since. Improved temporarily with a steroid course prescribed post-op, but has returned to hoarseness. Wonders if she has fibromyalgia due to excess pain, even above her baseline MS neuropathic pain, and exhaustion. Another doctor was suspicious of this and raised her cymbalta dose, she feels this helps.     5. Hx of colposcopy  Her last paps was Jan 2017.     6. Breast pain on right side  No nipple discharge. No masses. Last mammogram in 2016.   Some pain and pressure in her right breast directly under armpit, last couple weeks.   Reports it feels like someone punched her.  No trauma to this area.  Doesn't radiate or change location.  Last mammogram in 2016, is consistent about receiving them.  Sister had breast cancer at 12    7. Eyes - Uveitis  No opthalmologist at [facility]. She has a lazy eye. She has an appt on June 25 w/ eye doctor non-[place].  She has seen retinal specialist, has upcoming appointment was started on eye drops, she finished eye drops, now feels like same type of issues are returning that were her initial symptoms of uveitis- she will see some floaters and flashes, after she had treatment of eye  drops symptoms resolved, she has another appointment with retinal specialist, no curtain going over her vision    No history of prior psych admissions  She is on cymbalta and nortriptyline, she is seeing psychiatrist for Mood disorder NOS and chronic pain and PTSD   No SI, no HI      - Was in [place] in February        Review of Systems -   Constitutional: no fevers    HISTORY:  Patient Active Problem List   Diagnosis    Multiple sclerosis (CMS-HCC)     Past Medical History:   Diagnosis Date    Asthma 1991    with pregnancy    Hypothyroidism     Major depressive disorder, single episode     MS (multiple sclerosis) (CMS-HCC)      No past surgical history on file.    Current  Outpatient Prescriptions on File Prior to Visit   Medication Sig Dispense Refill    baclofen (LIORESAL) 10 MG tablet Take 1 tablet (10 mg) by mouth daily.  0    diazepam (VALIUM) 5 MG tablet Take 1 tablet (5 mg) by mouth once a week.      DULoxetine (CYMBALTA) 60 MG CR capsule Take 2 capsules (120 mg) by mouth daily. 60 capsule 1    nortriptyline (PAMELOR) 25 MG capsule Take 2 capsules (50 mg) by mouth nightly. 30 capsule 0    tizanidine (ZANAFLEX) 4 MG capsule Take 2 capsules (8 mg) by mouth at bedtime. 60 capsule 1     No current facility-administered medications on file prior to visit.      Allergies   Allergen Reactions    Penicillins Rash    Modafinil Diarrhea and Nausea and Vomiting     Family History   Problem Relation Age of Onset    Other Mother     Thyroid Mother     Cancer Father      lung    Cancer Brother     Cancer Sister      breast    Diabetes Sister     Stroke Brother      Social History     Social History    Marital status: Divorced     Spouse name: N/A    Number of children: N/A    Years of education: N/A     Occupational History    Not on file.     Social History Main Topics    Smoking status: Former Smoker     Packs/day: 1.00     Years: 26.00     Types: Cigarettes     Quit date: 02/01/2016    Smokeless tobacco: Never Used    Alcohol use 10.0 oz/week     4 Glasses of wine per week    Drug use: Not on file    Sexual activity: Yes     Partners: Male     Other Topics Concern    Not on file     Social History Narrative       OBJECTIVE:    BP 132/84 (BP Location: Left arm, BP Patient Position: Sitting, BP cuff size: Regular)   Pulse 96   Temp 98.9 F (37.2 C) (Oral)   Resp 18   Ht 5\' 4"  (1.626 m)   Wt 59.1 kg (130 lb 4.7 oz)   SpO2 100%   Breastfeeding? No   BMI 22.36 kg/m2    Body mass index is 22.36 kg/(m^2).  Wt Readings from Last 5 Encounters:   09/24/16 59.1 kg (130 lb 4.7 oz)   09/21/16 59.4 kg (131 lb)   08/24/16 59.2 kg (130 lb 9 oz)   08/23/16 59 kg (130 lb)      07/20/16 58.5 kg (129 lb)     Blood Pressure   09/24/16 132/84   09/21/16 114/65   08/24/16 122/73   08/23/16 117/76   08/19/16 109/78       General: WDWN, NAD, answers appropriately  Eyes: anicteric, PERRL, EOMI  HENT: moist oral mucosa, no oral lesions, no cervical adenopathy  Heart: RRR, no MRG  Lungs: normal effort, CTAB, no wheezes, rhonchi, crackles  Abd: Bs+, soft, NT, ND, no masses, no organomegaly.   Extremities: WWP, no swelling/pitting edema  Musculoskeletal: right shoulder full ROM, no pain to palpation, no deformity, negative impingement testing   Right Breast: normal exam, no masses, no skin changes, no nipple retraction  Psych: Alert and oriented x 3, appropriate affect, answering questions appropriately    ASSESMENT AND PLAN:       ICD-10-CM ICD-9-CM    1. Screened negative for drug use Z13.89 V82.9    2. Hypothyroidism, unspecified type  Switched per pt request to Armour. Checked TSH to see if appropriate dose. E03.9 244.9 thyroid (ARMOUR THYROID) 60 MG tablet      thyroid (ARMOUR THYROID) 15 MG tablet      TSH, Blood - See Instructions   3. Loose stools  Could be related to her MS will check for possible infection. If neg will refer to GI potentially R19.5 787.7 GI Pathogen Nucleic Acid Detection Test [employer]   4. Chronic right shoulder pain  Will get x-ray of shoulder and evaluate further at next visit. M25.511 719.41 X-Ray Shoulder Complete Min 2 Views - Right    G89.29 338.29    5. Preventative health care Z00.00 V70.0 Comprehensive Metabolic Panel - See Instructions      Lipid Panel Green Plasma Separator Tube      Glycosylated Hgb(A1C), Blood Lavender   6. Breast pain, right  Normal exam will get ultrasound and mammogram N64.4 611.71 US Breast Limited - Right        Digital Diagnostic Mammogram Bilateral   7. Hoarseness of voice  Will have pt see ENT to establish to get possible DL R49.0 784.42 Consult/Referral to HNS/ENT   8. Fibromyalgia  discussed w/ pt aerobic exercise  can help w/ pain. She is already on appropriate meds. Also discussed acupuncture can help w/ pain.  M79.7 729.1 gabapentin (NEURONTIN) 300 MG capsule      Digital Diagnostic Mammogram Bilateral   9. Uveitis  She will f/u w/ [facility] outside opthalmology. Discussed she needs to call opth. today regarding her increasing sx. We reviewed ER precautions and sx of retinal detachment  H20.9 364.3 Consult/Referral to Ophthalmology Clinic   10. 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 Will refer to rheumatology for further eval. M35.9 279.49 Consult/Referral to Rheumatology       F/U: Return in about 1 month (around 10/24/2016) for pap smear.     Medication Management:  Medications reviewed with patient and medication list reconciled.  Over the counter medications, herbal therapies and supplements reviewed.  Patient's understanding and response to medications assessed.    Barriers to medications assessed and addressed.  Risks, benefits, alternatives to medications reviewed.    I am personally taking down the notes in  the presence of Dr. Rana Snare, MD. -- Alla Feeling, 09/24/2016 4:33 PM   I , Dr. Winnifred Friar Durham Grosser, personally performed the services described in this documentation, as scribed by Alla Feeling in my presence, and it is both accurate and complete.  09/24/16    Kodee Drury, MD  Department of Family Medicine and [place], [facility]  HS Assistant Clinical Professor, [facility] Family [facility], [employer] [facility] [employer]

## 2016-09-24 NOTE — Progress Notes (Deleted)
SUBJECTIVE:  Debra Bolton is a 56 year old female here to establish care.    Moved here from [place] a few years ago, was still receiving care at [facility] and flying back until recently.  Has established care with a neurologist here at [place]. Has also seen psychiatry and been to pain clinic.  Would like to find PCP to help coordinate her care.    Was diagnosed with MS around 71.     Has been having a lot of diarrhea recently in the last few months, even having diarrhea in bed.  Feels she has little control over her external sphincter.  No nausea or vomiting.  3 bowel movements a day, very consistent.  No blood in the stool, no melena. Watery brown, not greasy.  Describes it as "explosive", can't make it to the bathroom.  Has not tried taking anything for the diarrhea.  No pain while passing the diarrhea.  Had a colonoscopy at 70, no abnormal findings.  Reports some diverticular disease.  Has a hiatal hernia, doesn't have GERD symptoms.    Constant stabbing pain in right shoulder, which sometimes wakes her up.  Will occasionally take tylenol for shoulder, doesn't always help.  Does yoga to try and stretch it.  No past injuries to that shoulder.  Doesn't feel like ice, heat, or resting it help.  Not muscular or joint pain, more like her nerve pain from MS but also distinct from that.    Was in hospital in February to have lymph nodes and tonsil out due to concern for oral cancer.  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   Throat has been raspy since before surgery. Improved temporarily with a steroid course prescribed post-op, but has returned to hoarse.    Wonders if she has fibromyalgia due to excess pain, even above her baseline MS neuropathic pain, and exhaustion.  Another doctor was suspicious of this and raised her cymbalta dose, she feels this helps.     Some pain and pressure in her left breast directly under armpit, last couple weeks.   Reports it feels like  someone punched her.  No trauma to this area.  Doesn't radiate or change location.  Last mammogram in 2016, is consistent about receiving them.  Sister had breast cancer at 50, beat it.    Has no teeth left on the upper, only 4 left on the bottom due to years of medications. Has removable prosthetics in currently.    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  Sees a retinal specialist for this and was given steroid drops, however believes it is coming back in right eye.  Has had spots and stars in vision for last 6 weeks.    Prior psych admissions-no  Prior psych meds- cymbalta and low dose nortryptiline  Family history of mental illness- nothing diagnosed, but lots of depression and anxiety  No SI, no HI    Review of Systems -   Constitutional: no fevers by home thermometer, but feels she has frequent fever and chills, attributes to menopause. No unexpected weight loss  Eyes: + changes in vision, spots and stars she attributes to uveitis for 6 weeks  Ears, Nose, Mouth, Throat: no cough, congestion, recent upper resp illnesses  CV: no palpitations, no chest pain  Resp: no SOB, no wheezing  GI: no nausea, vomiting, constipation, bloody stools, + diarrhea  GU: + spastic bladder, no dysuria, hematuria   Musculoskeletal: + increased  stiffness, + myalgias, + neck pain  Integumentary: no rashes  Neuro: +weakness, numbness, tingling, hyperreflexia, seizures all related to MS  Psych: + depression symptoms  Endo: no increased thirst, no heat/cold intolerance  Heme/Lymphatic: no easy bleeding, bruising.    HISTORY:  Patient Active Problem List   Diagnosis    Multiple sclerosis (CMS-HCC)     Past Medical History:   Diagnosis Date    Asthma 1991    with pregnancy    Hypothyroidism     Major depressive disorder, single episode     MS (multiple sclerosis) (CMS-HCC)      Tonsils and lymph nodes out in 2/18  Colposcopy and D&C for severe dysplasia    Current Outpatient Prescriptions on File Prior to Visit   Medication Sig  Dispense Refill    baclofen (LIORESAL) 10 MG tablet Take 1 tablet (10 mg) by mouth daily.  0    diazepam (VALIUM) 5 MG tablet Take 1 tablet (5 mg) by mouth once a week.      DULoxetine (CYMBALTA) 60 MG CR capsule Take 2 capsules (120 mg) by mouth daily. 60 capsule 1    gabapentin (NEURONTIN) 300 MG capsule Take 600 mg by mouth 3 times daily.        liothyronine (CYTOMEL) 25 MCG tablet Take 25 mcg by mouth 3 times daily.   30 tablet 0    nortriptyline (PAMELOR) 25 MG capsule Take 2 capsules (50 mg) by mouth nightly. 30 capsule 0    tizanidine (ZANAFLEX) 4 MG capsule Take 2 capsules (8 mg) by mouth at bedtime. 60 capsule 1     No current facility-administered medications on file prior to visit.      Would like to switch her thyroid meds.    Allergies   Allergen Reactions    Penicillins Rash    Modafinil Diarrhea and Nausea and Vomiting     Doesn't feel modafinil is a real allergy, just a side effect    Family History   Problem Relation Age of Onset    Other Mother     Thyroid Mother     Cancer Father      lung    Cancer Brother     Cancer Sister      breast    Diabetes Sister     Stroke Brother      Mom had hypothyroid.  Father had lung cancer, died at 68.  Brother had emphysema and died at 46. Did not have cancer  Older sister had breast cancer, another sister died in a car accident.  5 brothers and 3 sisters.    Social History     Social History    Marital status: Divorced     Spouse name: N/A    Number of children: N/A    Years of education: N/A     Occupational History    Not on file.     Social History Main Topics    Smoking status: Former Smoker     Packs/day: 1.00     Years: 26.00     Types: Cigarettes     Quit date: 02/01/2016    Smokeless tobacco: Never Used    Alcohol use 10.0 oz/week     4 Glasses of wine per week    Drug use: Not on file    Sexual activity: Yes     Partners: Male     Other Topics Concern    Not on file     Social History  Narrative     Diet is great, high fiber,  she is very conscientious of effect of diet on health.  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  Hasn't had a cigarette in 8 months.  Has smoked 26 years, less than a pack a day.  Currently drinks 2-4 drinks a day on the weekends, total 4-8 per week.  Sexually active with one female partner.  Currently not working, has been on disability for most of the time from 1992 to now.      OBJECTIVE:    BP 132/84 (BP Location: Left arm, BP Patient Position: Sitting, BP cuff size: Regular)   Pulse 96   Temp 98.9 F (37.2 C) (Oral)   Resp 18   Ht 5\' 4"  (1.626 m)   Wt 59.1 kg (130 lb 4.7 oz)   SpO2 100%   Breastfeeding? No   BMI 22.36 kg/m2    Body mass index is 22.36 kg/(m^2).  Wt Readings from Last 5 Encounters:   09/24/16 59.1 kg (130 lb 4.7 oz)   09/21/16 59.4 kg (131 lb)   08/24/16 59.2 kg (130 lb 9 oz)   08/23/16 59 kg (130 lb)   07/20/16 58.5 kg (129 lb)     Blood Pressure   09/24/16 132/84   09/21/16 114/65   08/24/16 122/73   08/23/16 117/76   08/19/16 109/78       General: WDWN, NAD, answers appropriately  Eyes: anicteric, PERRL, EOMI  HENT: TMs normal, nasal mucosa ***, OP clear  Heart: RRR, no MRG, no JVD noted  Lungs: normal effort, CTAB, no wheezes, rhonchi, crackles  Abd: Bs+, soft, NT, ND, no masses, no organomegaly.   Extremities: WWP, no swelling/pitting edema  Neuro: normal gait, grossly normal strength upper and lower extremities. Patellar reflexes 2+ bilaterally    ASSESMENT AND PLAN:       ICD-10-CM ICD-9-CM    1. Screened negative for drug use Z13.89 V82.9        F/U: No Follow-up on file.

## 2016-09-27 ENCOUNTER — Telehealth (INDEPENDENT_AMBULATORY_CARE_PROVIDER_SITE_OTHER): Payer: Self-pay | Admitting: Family Practice

## 2016-09-27 NOTE — Telephone Encounter (Signed)
Patient states pharmacy advised that Armour Thyroid is not covered by insurance. Pt would like to know if it can get covered. Please advise.    FYI: Patient states in the past she had liothyronine prescribed.    Pharmacy: [id number] - [place], [place] - [address] AT [address]  [phone] 07/8

## 2016-09-28 NOTE — Telephone Encounter (Signed)
PA request for Armour Thyroid has been placed in queue for processing. Please allow up to 72 business hours for initiation. If this is an urgent request due to immediate therapy please re-route as high priority. Thank you so much!

## 2016-09-29 ENCOUNTER — Encounter (INDEPENDENT_AMBULATORY_CARE_PROVIDER_SITE_OTHER): Payer: Self-pay | Admitting: Family Practice

## 2016-09-29 NOTE — Telephone Encounter (Signed)
.  PAR APPROVED for ARMOUR THYROID via CMM Key # [id number]   Confirmed with Pharmacy claim has been paid, they will notify patient when ready to pick up.   Thank you!      CASE [id number]

## 2016-09-30 ENCOUNTER — Encounter (INDEPENDENT_AMBULATORY_CARE_PROVIDER_SITE_OTHER): Payer: Self-pay | Admitting: Internal Medicine

## 2016-09-30 ENCOUNTER — Other Ambulatory Visit
Admission: RE | Admit: 2016-09-30 | Discharge: 2016-09-30 | Disposition: A | Discharge status: Home / Self Care | Payer: Medicare Other | Attending: Family Practice | Admitting: Family Practice

## 2016-09-30 DIAGNOSIS — R195 Other fecal abnormalities: Principal | ICD-10-CM | POA: Insufficient documentation

## 2016-09-30 DIAGNOSIS — Z0000 Encounter for general adult medical examination without abnormal findings: Principal | ICD-10-CM

## 2016-10-01 ENCOUNTER — Ambulatory Visit
Admission: RE | Admit: 2016-10-01 | Discharge: 2016-10-04 | Disposition: A | Discharge status: Home / Self Care | Payer: Medicare Other | Attending: Neurology | Admitting: Neurology

## 2016-10-01 ENCOUNTER — Other Ambulatory Visit (INDEPENDENT_AMBULATORY_CARE_PROVIDER_SITE_OTHER): Payer: Medicare Other

## 2016-10-01 ENCOUNTER — Other Ambulatory Visit (INDEPENDENT_AMBULATORY_CARE_PROVIDER_SITE_OTHER): Payer: Self-pay

## 2016-10-01 ENCOUNTER — Ambulatory Visit: Payer: Medicare Other | Attending: Clinical | Admitting: Clinical

## 2016-10-01 DIAGNOSIS — Z0000 Encounter for general adult medical examination without abnormal findings: Secondary | ICD-10-CM | POA: Insufficient documentation

## 2016-10-01 DIAGNOSIS — G35 Multiple sclerosis: Principal | ICD-10-CM

## 2016-10-01 DIAGNOSIS — E039 Hypothyroidism, unspecified: Secondary | ICD-10-CM | POA: Insufficient documentation

## 2016-10-01 DIAGNOSIS — F32A Depression, unspecified: Secondary | ICD-10-CM

## 2016-10-01 DIAGNOSIS — F419 Anxiety disorder, unspecified: Secondary | ICD-10-CM

## 2016-10-01 DIAGNOSIS — F329 Major depressive disorder, single episode, unspecified: Secondary | ICD-10-CM

## 2016-10-01 LAB — COMPREHENSIVE METABOLIC PANEL, BLOOD
ALT (SGPT): 16 U/L (ref 0–33)
AST (SGOT): 23 U/L (ref 0–32)
Albumin: 4.3 g/dL (ref 3.5–5.2)
Alkaline Phos: 77 U/L (ref 35–140)
Anion Gap: 12 mmol/L (ref 7–15)
BUN: 12 mg/dL (ref 6–20)
Bicarbonate: 26 mmol/L (ref 22–29)
Bilirubin, Tot: 0.3 mg/dL (ref <1.2)
Calcium: 9.8 mg/dL (ref 8.5–10.6)
Chloride: 102 mmol/L (ref 98–107)
Creatinine: 0.76 mg/dL (ref 0.51–0.95)
GFR: >60 mL/min
Glucose: 85 mg/dL (ref 70–99)
Potassium: 4 mmol/L (ref 3.5–5.1)
Sodium: 140 mmol/L (ref 136–145)
Total Protein: 7.4 g/dL (ref 6.0–8.0)

## 2016-10-01 LAB — GASTROINTESTINAL PATHOGEN NUCLEIC ACID DETECTION TEST, STOOL
Adenovirus F 40/41 PCR, Stool: NOT DETECTED
Astrovirus PCR, Stool: NOT DETECTED
Campylobacter species PCR, Stool: NOT DETECTED
Clostridium difficile (toxin A/B) PCR, Stool: NOT DETECTED
Cryptosporidium PCR, Stool: NOT DETECTED
Cyclospora cayetanensis PCR, Stool: NOT DETECTED
Entamoeba histolytica PCR, Stool: NOT DETECTED
Enteroaggregative E. coli (EAEC) PCR, Stool: NOT DETECTED
Enteropathogenic E. coli (EPEC) PCR, Stool: NOT DETECTED
Enterotoxigenic E. coli (ETEC) PCR, Stool: NOT DETECTED
Giardia lamblia PCR, Stool: NOT DETECTED
Norovirus GI/GII PCR, Stool: NOT DETECTED
Plesiomonas shigelloides PCR, Stool: NOT DETECTED
Rotavirus A PCR, Stool: NOT DETECTED
Salmonella species PCR, Stool: NOT DETECTED
Sapovirus (I, II, IV, and V) PCR, Stool: NOT DETECTED
Shiga-like toxin-producing E. coli (STEC) PCR, Stool: NOT DETECTED
Shigella/Enteroinvasive E. coli (EIEC) PCR, Stool: NOT DETECTED
Vibrio cholerae PCR, Stool: NOT DETECTED
Vibrio species PCR, Stool: NOT DETECTED
Yersinia enterocolitica PCR, Stool: NOT DETECTED

## 2016-10-01 LAB — GLYCOSYLATED HGB(A1C), BLOOD: Glyco Hgb (A1C): 5.2 % (ref 4.8–5.8)

## 2016-10-01 LAB — LIPID(CHOL FRACT) PANEL, BLOOD
Cholesterol: 229 mg/dL (ref <200)
HDL-Cholesterol: 94 mg/dL
LDL-Chol (Calc): 117 mg/dL (ref <160)
Non-HDL Cholesterol: 135 mg/dL
Triglycerides: 88 mg/dL (ref 10–170)

## 2016-10-01 LAB — TSH, BLOOD: TSH: 0.01 u[IU]/mL — ABNORMAL LOW (ref 0.27–4.20)

## 2016-10-01 MED ORDER — SODIUM CHLORIDE 0.9 % IV SOLN
INTRAVENOUS | Status: DC
Start: 2016-10-01 — End: 2016-10-04
  Administered 2016-10-01: 12:00:00 via INTRAVENOUS

## 2016-10-01 MED ORDER — SODIUM CHLORIDE 0.9 % IV SOLN
300.0000 mg | Freq: Once | INTRAVENOUS | Status: AC
Start: 2016-10-01 — End: 2016-10-01
  Administered 2016-10-01: 300 mg via INTRAVENOUS
  Filled 2016-10-01: qty 15

## 2016-10-01 NOTE — Interdisciplinary (Signed)
Non-Chemotherapy Infusion Nursing Note -   [place]    Debra Bolton is a 56 year old female who presents for infusion of Tysabri    Vitals:    10/01/16 1116   BP: 120/79   BP Location: Left arm   BP Patient Position: Sitting   Pulse: 85   Resp: 16   Temp: 98.2 F (36.8 C)   TempSrc: Oral   SpO2: 98%   Weight: 59.9 kg (132 lb 1.6 oz)   Height: 5\' 4"  (1.626 m)     Pain Score: 0  Body surface area is 1.64 meters squared.  Body mass index is 22.67 kg/(m^2).    Pre-treatment nursing assessment:  No problems identified upon assessment.  Patient is ambulatory and in no distress, unaccompanied today.  Afebrile. No reported acute changes or worsening vision, mobility or decline in performance status.  TouchProgram questionnaire done.  PIV placed on left arm, good blood return  Pt received  Medications   sodium chloride 0.9% infusion ( IntraVENOUS Completed 10/01/16 1300)   natalizumab (TYSABRI) 300 mg in sodium chloride 0.9 % 100 mL IVPB (0 mg IntraVENOUS Completed 10/01/16 1245)         Debra Bolton tolerated treatment well.    Post blood return: Brisk  Post-Flush: NS and Discontinued IV    Patient Education  Learner: Patient  Barriers to learning: No Barriers  Readiness to learn: Acceptance  Method: Explanation    Treatment Education: Information/teaching given to patient including: signs and symptoms of infection, bleeding, adverse reaction(s), symptom control, and when to notify MD.    Debra Bolton Prevention Education: Instructed patient to call for assistance.    Pain Education: Patient instructed to contact nurse if pain should develop or if their current pain therapy becomes ineffective.    Response: Verbalizes understanding    Discharge Plan  Discharge instructions given to patient. Declined to stay for post observation after Tysabri  Future appointments given and reviewed with treatment plan.  Discharge Mode: Ambulatory  Discharge Time:  [place] by: Self  Discharged To: Home

## 2016-10-01 NOTE — Progress Notes (Signed)
PAIN PSYCHOLOGY ENCOUNTER NOTE    Patient Name: Debra Bolton  MRN: [id number]  Age: 56 year old    Referral Source: Dr. Thereasa Parkin    Reason for Referral: Patient with chronic pain 2/2 MS referred for a psychological assessment to determine if she may benefit from psychotherapy for pain management and coping.    Intake evaluation was performed by Edmonia Caprio, Ph.D., licensed clinical psychologist, with the assistance of Theotis Barrio, MS, Psychology Intern. At outset of appointment, patient was informed about confidentiality and limits to confidentiality within a therapeutic relationship. The patient acknowledged understanding of this information and consented to the intake. Full intake note to follow.    Recommendations:  1. CBT for Pain group- patient will be contacted when a start date is finalized.    2. Individual therapy for post-traumatic symptoms (e.g., Prolonged Exposure, Cognitive Processing Therapy). She was provided with a list of low-cost/sliding scale clinics, as well as referral names of providers in the community who specialize in trauma-focused therapy. She was encouraged to discuss this further with her [place] Psychiatrist, Dr. Madolyn Frieze.

## 2016-10-02 ENCOUNTER — Encounter (INDEPENDENT_AMBULATORY_CARE_PROVIDER_SITE_OTHER): Payer: Self-pay | Admitting: Family Practice

## 2016-10-04 ENCOUNTER — Encounter (INDEPENDENT_AMBULATORY_CARE_PROVIDER_SITE_OTHER): Payer: Self-pay | Admitting: Family Practice

## 2016-10-04 DIAGNOSIS — R7989 Other specified abnormal findings of blood chemistry: Principal | ICD-10-CM

## 2016-10-07 ENCOUNTER — Ambulatory Visit (HOSPITAL_BASED_OUTPATIENT_CLINIC_OR_DEPARTMENT_OTHER): Payer: Medicare Other | Admitting: Neurology
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
## 2016-10-12 ENCOUNTER — Telehealth (INDEPENDENT_AMBULATORY_CARE_PROVIDER_SITE_OTHER): Payer: Self-pay | Admitting: Family Practice

## 2016-10-12 NOTE — Telephone Encounter (Signed)
Medical records requested from [facility] at [phone]

## 2016-10-13 LAB — JCV AB (WITH INDEX) WITH/REFLEX TO INHIBITION ASSAY

## 2016-10-15 ENCOUNTER — Encounter (INDEPENDENT_AMBULATORY_CARE_PROVIDER_SITE_OTHER): Payer: Self-pay | Admitting: Family Practice

## 2016-10-15 NOTE — Telephone Encounter (Signed)
From: [name]  To: Edi, Verl Blalock, MD  Sent: 10/15/2016 3:41 PM PDT  Subject: 1-Non Urgent Medical Advice    Dear Dr. Rana Snare,    I just saw the note to get a test for thyroid and I will be in the area on the 6th and I will take care of that.     On another note, I received my script for the armour thyroid and have been taking it since June 9th. At first I was happy, i seem to be a little more alive, my libido is back to normal, and I felt a little more like myself, able to excercise a little more...     However over the past week or so I am a little bit harried.  I am experiencing extreme nightmares, and feeling like I am crawling out of my skin, I have headaches and trouble falling asleep and pounding or hearing my pulse inside my head when I lay on either side, and intermittent tics (spasms in my legs and or wrists).    While I am not sure if this is to much thyroid or to much of my other meds that may be working better because of the new thyroid making those meds more effective, I thought you should know and perhaps I should change things a little, any suggestions would be appreciated.    Thanks   Amalia Edgecombe MR # [id number]

## 2016-10-19 ENCOUNTER — Encounter (INDEPENDENT_AMBULATORY_CARE_PROVIDER_SITE_OTHER): Payer: Self-pay | Admitting: Family Practice

## 2016-10-21 DIAGNOSIS — F1120 Opioid dependence, uncomplicated: Secondary | ICD-10-CM | POA: Diagnosis not present

## 2016-10-21 DIAGNOSIS — Z8781 Personal history of (healed) traumatic fracture: Secondary | ICD-10-CM | POA: Diagnosis not present

## 2016-10-21 DIAGNOSIS — G894 Chronic pain syndrome: Secondary | ICD-10-CM | POA: Diagnosis not present

## 2016-10-21 DIAGNOSIS — Z720 Tobacco use: Secondary | ICD-10-CM | POA: Diagnosis not present

## 2016-10-21 DIAGNOSIS — M5136 Other intervertebral disc degeneration, lumbar region: Secondary | ICD-10-CM | POA: Diagnosis not present

## 2016-10-21 DIAGNOSIS — G5600 Carpal tunnel syndrome, unspecified upper limb: Secondary | ICD-10-CM | POA: Diagnosis not present

## 2016-10-21 DIAGNOSIS — M542 Cervicalgia: Secondary | ICD-10-CM | POA: Diagnosis not present

## 2016-10-21 DIAGNOSIS — Z79891 Long term (current) use of opiate analgesic: Secondary | ICD-10-CM | POA: Diagnosis not present

## 2016-10-21 DIAGNOSIS — M546 Pain in thoracic spine: Secondary | ICD-10-CM | POA: Diagnosis not present

## 2016-10-22 ENCOUNTER — Ambulatory Visit (INDEPENDENT_AMBULATORY_CARE_PROVIDER_SITE_OTHER): Payer: Medicare Other | Admitting: Psychiatry

## 2016-10-22 VITALS — BP 130/85 | HR 65 | Ht 64.0 in | Wt 131.7 lb

## 2016-10-22 DIAGNOSIS — M797 Fibromyalgia: Principal | ICD-10-CM

## 2016-10-22 DIAGNOSIS — G35 Multiple sclerosis: Secondary | ICD-10-CM

## 2016-10-22 DIAGNOSIS — F339 Major depressive disorder, recurrent, unspecified: Secondary | ICD-10-CM

## 2016-10-22 DIAGNOSIS — G894 Chronic pain syndrome: Secondary | ICD-10-CM

## 2016-10-22 DIAGNOSIS — F4310 Post-traumatic stress disorder, unspecified: Secondary | ICD-10-CM
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
## 2016-10-27 MED ORDER — NORTRIPTYLINE HCL 25 MG OR CAPS
50.0000 mg | ORAL_CAPSULE | Freq: Every evening | ORAL | 0 refills | Status: DC
Start: 2016-10-27 — End: 2017-01-12

## 2016-10-28 ENCOUNTER — Ambulatory Visit (HOSPITAL_BASED_OUTPATIENT_CLINIC_OR_DEPARTMENT_OTHER)
Admit: 2016-10-28 | Discharge: 2016-10-28 | Disposition: A | Discharge status: Home Health | Payer: Medicare Other | Attending: Family Practice | Admitting: Family Practice

## 2016-10-28 ENCOUNTER — Ambulatory Visit
Admission: RE | Admit: 2016-10-28 | Discharge: 2016-10-28 | Disposition: A | Discharge status: Home / Self Care | Payer: Medicare Other | Attending: Diagnostic Radiology | Admitting: Diagnostic Radiology

## 2016-10-28 ENCOUNTER — Encounter (HOSPITAL_BASED_OUTPATIENT_CLINIC_OR_DEPARTMENT_OTHER): Payer: Self-pay | Admitting: Student in an Organized Health Care Education/Training Program

## 2016-10-28 ENCOUNTER — Encounter (INDEPENDENT_AMBULATORY_CARE_PROVIDER_SITE_OTHER): Payer: Self-pay | Admitting: Head and Neck Surgery

## 2016-10-28 ENCOUNTER — Ambulatory Visit (INDEPENDENT_AMBULATORY_CARE_PROVIDER_SITE_OTHER): Payer: Medicare Other | Admitting: Head and Neck Surgery

## 2016-10-28 ENCOUNTER — Other Ambulatory Visit (INDEPENDENT_AMBULATORY_CARE_PROVIDER_SITE_OTHER): Payer: Self-pay | Admitting: Family Practice

## 2016-10-28 ENCOUNTER — Ambulatory Visit (INDEPENDENT_AMBULATORY_CARE_PROVIDER_SITE_OTHER): Payer: Medicare Other | Admitting: Speech Therapy

## 2016-10-28 ENCOUNTER — Other Ambulatory Visit (INDEPENDENT_AMBULATORY_CARE_PROVIDER_SITE_OTHER): Payer: Medicare Other

## 2016-10-28 DIAGNOSIS — M797 Fibromyalgia: Secondary | ICD-10-CM

## 2016-10-28 DIAGNOSIS — G8929 Other chronic pain: Principal | ICD-10-CM

## 2016-10-28 DIAGNOSIS — R490 Dysphonia: Secondary | ICD-10-CM

## 2016-10-28 DIAGNOSIS — N644 Mastodynia: Principal | ICD-10-CM | POA: Insufficient documentation

## 2016-10-28 DIAGNOSIS — M85811 Other specified disorders of bone density and structure, right shoulder: Secondary | ICD-10-CM | POA: Insufficient documentation

## 2016-10-28 DIAGNOSIS — R7989 Other specified abnormal findings of blood chemistry: Principal | ICD-10-CM

## 2016-10-28 DIAGNOSIS — R946 Abnormal results of thyroid function studies: Secondary | ICD-10-CM

## 2016-10-28 DIAGNOSIS — R1310 Dysphagia, unspecified: Principal | ICD-10-CM

## 2016-10-28 DIAGNOSIS — M25511 Pain in right shoulder: Secondary | ICD-10-CM | POA: Insufficient documentation

## 2016-10-28 DIAGNOSIS — R471 Dysarthria and anarthria: Secondary | ICD-10-CM

## 2016-10-28 DIAGNOSIS — R1319 Other dysphagia: Secondary | ICD-10-CM

## 2016-10-28 DIAGNOSIS — G35 Multiple sclerosis: Principal | ICD-10-CM

## 2016-10-28 LAB — TOTAL T3, BLOOD: T3 Total: 0.7 ng/mL — ABNORMAL LOW (ref 0.8–2.0)

## 2016-10-28 LAB — FREE THYROXINE, BLOOD: Free T4: 0.56 ng/dL — ABNORMAL LOW (ref 0.93–1.70)

## 2016-10-28 NOTE — Progress Notes (Signed)
Notified by infusion center that patient needs additional orders for Tysabri    Last office visit 06/09/16, plan to continue Tysabri q 4 weeks    Last JCV Ab index 0.21 (06/2016)    Next office visit 11/16/16    JCV added to Sept treatment plan    Orders entered and signed per protocol.

## 2016-10-28 NOTE — Progress Notes (Signed)
[facility]   [facility]ENTER FOR VOICE AND SWALLOWING     CHIEF COMPLAINT:  Dysphagia and hoarseness    HISTORY OF PRESENT ILLNESS:  This is a 56 year old female who is referred by Dr. Rana Snare for evaluation of dysphagia and hoarseness. Patient has a history of Multiple Sclerosis since 1990, with distinct episodes of extremity weakness, dysarthria, and dysphagia. These symptoms will began and cease together during episodes. She is worried about the dysphagia has she has experienced a few distinct episodes of choking. She is currently on regular Tysabri treatments for her MS - unsure if it has helped her sxs.     She has followed closely at [facility] up until a few months ago. There, she was noted to have a right neck mass and enlarged right tonsil and underwent excisional LN biopsy and right tonsillectomy only. 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 She reports that she feels intermittent fullness in her throat and neck swelling, distinct from the dysphagia episodes.     Also with complaints of hoarseness and dysarthria. This has been going on many years. Has never underwent Speech or Swallow Therapy.        Past Medical History:   Diagnosis Date    Asthma 1991    with pregnancy    Hypothyroidism     Major depressive disorder, single episode     MS (multiple sclerosis) (CMS-HCC)        Past Surgical History:   Procedure Laterality Date    LYMPH NODE BIOPSY      IgG4 related disease    TONSILLECTOMY AND ADENOIDECTOMY      only tonsillectomy       Current Outpatient Prescriptions on File Prior to Visit   Medication Sig Dispense Refill    baclofen (LIORESAL) 10 MG tablet Take 1 tablet (10 mg) by mouth daily.  0    diazepam (VALIUM) 5 MG tablet Take 1 tablet (5 mg) by mouth once a week.      DULoxetine (CYMBALTA) 60 MG CR capsule Take 2 capsules (120 mg) by mouth daily. 60 capsule 1    gabapentin (NEURONTIN) 300 MG capsule Take 2 capsules (600 mg) by mouth  nightly.      nortriptyline (PAMELOR) 25 MG capsule Take 2 capsules (50 mg) by mouth nightly. 60 capsule 0    thyroid (ARMOUR THYROID) 15 MG tablet Take 1 tablet (15 mg) by mouth daily. 90 tablet 2    thyroid (ARMOUR THYROID) 60 MG tablet Take 1 tablet (60 mg) by mouth daily. 90 tablet 3    tizanidine (ZANAFLEX) 4 MG capsule Take 2 capsules (8 mg) by mouth at bedtime. 60 capsule 1     No current facility-administered medications on file prior to visit.        Allergies   Allergen Reactions    Penicillins Rash    Modafinil Diarrhea and Nausea and Vomiting       Social History     Social History    Marital status: Divorced     Spouse name: N/A    Number of children: N/A    Years of education: N/A     Occupational History    Not on file.     Social History Main Topics    Smoking status: Former Smoker     Packs/day: 1.00     Years: 26.00     Types: Cigarettes     Quit date:  02/01/2016    Smokeless tobacco: Never Used    Alcohol use 10.0 oz/week     4 Glasses of wine per week    Drug use: Not on file    Sexual activity: Yes     Partners: Male     Other Topics Concern    Not on file     Social History Narrative    Moved from [place] is great, high fiber, she is very conscientious of effect of diet on health.    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    Hasn't had a cigarette in 8 months.    Has smoked 26 years, less than a pack a day.    Currently drinks 2-4 drinks a day on the weekends, total 4-8 per week.    Sexually active with one female partner.    Currently not working, has been on disability for most of the time from 1992 to now.       ROS:    A 10 system review of systems was negative except per HPI    PHYSICAL EXAMINATION  BP 121/78 (BP Location: Left arm, BP Patient Position: Sitting, BP cuff size: Regular)   Pulse 85   Temp 98.6 F (37 C) (Oral)   Ht 5\' 4"  (1.626 m)   Wt 59.4 kg (131 lb)   SpO2 98%   BMI 22.49 kg/m2  VOICE:  No strain, unremarkable speaking voice, mild tremor with sustained  phonation. >10s MPT  GENERAL:  No apparent distress.  Normal affect.  EARS:  Right:  External auditory canal patent.  Tympanic membrane intact.  Middle ear aerated.  Left:  External auditory canal patent.  Tympanic membrane intact.  Middle ear aerated.  NOSE: clear anteriorly.  Nasal mucosa healthy.  Septum midline.  ORAL CAVITY: no masses or ulcerations.  OROPHARYNX: no masses or ulcerations.  Palate elevates midline. Absent right tonsil. 1+ left tonsil  NECK: No masses.  No cervical adenopathy.  No thyroid masses.  NEURO:  CN's V, VII, and XII intact and symmetrical.    PROCEDURE:  Videostroboscopy:    Flexible laryngoscopy with videostroboscopy was performed.  After anesthetization and verbal consent, an endoscope was inserted through the right nasal pasage.  The nasopharynx, base of tongue, pyriform sinus, and post-cricoid region were free of masses, lesions, and ulcerations.  The vocal folds adduct and abduct normally and symmetrically.  There is a patent glottic airway.  There is no edema and no erythema noted at the endolarynx.    Stroboscopic examination reveals a normal vibratory amplitude of the true vocal folds.  There is normal propagation of the mucosal wave.  There are no sumbmucosal lesions noted.  There is complete glottic closure during sustained phonatory tasks. There is some supraglottic squeeze with phonation.    Fiberoptic Endoscopic Evaluation of Swallowing (FEES):  Pitch elevation was weak.  Secretions were well managed.  Pharyngeal squeeze was normal.       PO administered: Liquid, softs   Oral bolus control: normal   Base of tongue retraction: normal   Swallow timing: normal   Pharyngeal peristalsis: none   Airway protection: adequate   Stasis / location: Mild at base of tongue with repeat swallow   Sensation / response to residue: normal   Penetration: None with all texture(s)      Aspiration: None with all texture(s)   Compensatory strategies: Adequate, good glottic protection      Biofeedback:  n/a   Penetration-Aspiration Scale:  1     PAS Scoring  1-Does not enter airway  2-Enters airway, remains above vocal folds, no residue  3-Remains above vocal folds, visible residual remains  4-Contacts vocal folds, no residue remains  5-Contacts vocal folds, visible residue remains  6-Passes glottis, no subglottic residue visible  7-Passes glottis, visible subglottic residual despite sensory response  8-Passes glottis, visible subglottic residual, absent sensory response    RESULTS:  None on record    ASSESSMENT &  PLAN:  (1) Dysphagia  (2) Dysphonia, Reinkes edema    56 year old female with Multiple Sclerosis 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 There is no overt manifestation of her IgG4 disease on FIDL today. Also with subjective intermittent hoarseness with a largely normal stroboscopy exam today save for some mild left TVC fullness and mild tremor during sustained phonation. May follow-up prn in the future for recurrent issues.    Thank you for the referral of this patient to the Voice and [place] at [facility].  Should you have any questions about the care of this patient or any other, please call us at [phone].

## 2016-10-28 NOTE — Interdisciplinary (Signed)
[facility]     Speech-Language Pathology  Voice Evaluation    Visit conducted collaboratively with Dr. Creta Levin.     Reason for Referral  Debra Bolton is a 56 year old female referred by Dr. Rana Snare, Rina S for dysphonia, dysarthria and dysphagia.  She has a 30 year history of relapse and remitting multiple sclerosis.  Her exacerbations are typically provoked by fatigue and last several days.  Voice never completely normalizes.  She smoked 1/2-1 PPD from age [age] and again from 2002-2017.  Her dysphagia is specific to solids and pills.  Over the past 6 years she has choked to the point of needing the Heimlich maneuver.  She consumes a diet with variety of textures.  She has a hiatal hernia.  There is no GERD.  Currently she is asymptomatic.  Her articulation also fluctuates based on fatigue.  Her right tonsil and lymph node were excised recently.  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  She is interested in undergoing laryngoscopy to determine if there are other irregular growths.  She also recently moved from [place] ans is looking to establish care with providers from our [place] system should there be a future exacerbation.      Social History  Lives in [place].  Daughter is a Therapist, sports.      Medications  Current Outpatient Prescriptions on File Prior to Visit   Medication Sig Dispense Refill    baclofen (LIORESAL) 10 MG tablet Take 1 tablet (10 mg) by mouth daily.  0    diazepam (VALIUM) 5 MG tablet Take 1 tablet (5 mg) by mouth once a week.      DULoxetine (CYMBALTA) 60 MG CR capsule Take 2 capsules (120 mg) by mouth daily. 60 capsule 1    gabapentin (NEURONTIN) 300 MG capsule Take 2 capsules (600 mg) by mouth nightly.      nortriptyline (PAMELOR) 25 MG capsule Take 2 capsules (50 mg) by mouth nightly. 60 capsule 0    thyroid (ARMOUR THYROID) 15 MG tablet Take 1 tablet (15 mg) by mouth daily. 90 tablet 2    thyroid (ARMOUR THYROID) 60 MG tablet Take 1 tablet (60  mg) by mouth daily. 90 tablet 3    tizanidine (ZANAFLEX) 4 MG capsule Take 2 capsules (8 mg) by mouth at bedtime. 60 capsule 1     No current facility-administered medications on file prior to visit.      Medical History  Past Medical History:   Diagnosis Date    Asthma 1991    with pregnancy    Hypothyroidism     Major depressive disorder, single episode     MS (multiple sclerosis) (CMS-HCC)      Patient Active Problem List   Diagnosis    Multiple sclerosis (CMS-HCC)     Behavioral Voice Evaluation  Voice Quality:  Mildly harsh.  Tremulous with sustained pitches.    Aerodynamic Measures    S/Z Ratio:  10 / 12  Normal = 1.4 0.83  Normal = 1.4   Vital Capacity:  Predicted in milliliters x 1000 for Liters  Males     [27.63 - (0.112 x Age)] x Height (cm)  Females [27.78 - (0.101 x Age)] x Height (cm) 2.3 Liters  Predicted  3.6 Liters   Maximum Phonation Time:  Predicted in seconds  Males     (Vital Capacity (mL) / 110) x 0.67  Females (Vital Capacity (mL) / 100) x 0.59  7 Seconds  Predicted  14 Seconds   Phonation Quotient:   (Estimated transglottal airflow)  Vital Capacity (mL) / Maximum Phonation Time = Phonation Quotient 328 mL/sec  Predicted   Males     135 mL/s (sd = 19.3)  Females 125.8 mL/s (sd = 16.4)   Estimated Mean Flow Rate:  EMFR = 77 + 0.236 (Phonation Quotient) 154 mL/sec  Predicted  Males      157mL/s (sd = 4.59)  Females  117mL/s (sd = 4.03)     Acoustic Measures  Speaking Pitch:      Pitch Range:    C#3 - E4  Average Speaking Volume:  65-70dB  Multidimensional Voice Profile:         Interpretation of Aerodynamic and Acoustic Laryngeal Function:  High airflow and correlating voice turbulence index.  Videostroboscopy demonstrated complete glottal closure; however, there was mild polypoid change to her left free edge potentially affecting airflow.  Also with low pulmonary reserve based on spirometry.  Acoustic values also irregular.  Exhibited significant pitch limitation.  Unclear whether it was  functional or inability to elevate frequency.    Oral Motor Evaluation  Lips, tongue and palate exhibited normal tone, strength and range of motion.    Glottal attack was sharp.  Voice was tremulous.  Speech not dysarthric today.    Swallowing Observations  Pre-swallow observations:  Pharynx clear of secretions  Texture administered: Puree Response: Normal anterior to posterior transport, timely swallow, cleared pharynx, no penetration or aspiration.      Texture administered: Fruit cocktail Response: Normal anterior to posterior transport, timely swallow, cleared pharynx, no penetration or aspiration.      Texture administered Cracker Response: Normal anterior to posterior transport, timely swallow, cleared pharynx, no penetration or aspiration.      Texture administered Thin liquids Response: Normal anterior to posterior transport, timely swallow, cleared pharynx, no penetration or aspiration.      Cough effective:  Yes  Compensatory maneuvers trialed:  Not indicated  Compensatory maneuvers helpful:  N/A  Safest diet:  Regular as tolerated.    PROCEDURE:  Videostroboscopy:    Flexible laryngoscopy with videostroboscopy was performed.  After anesthetization and verbal consent, an endoscope was inserted through the right nasal pasage.  The nasopharynx, base of tongue, pyriform sinus, and post-cricoid region were free of masses, lesions, and ulcerations.  The vocal folds adduct and abduct normally and symmetrically.  There is a patent glottic airway.  There is no edema and no erythema noted at the endolarynx.    Stroboscopic examination reveals a normal vibratory amplitude of the true vocal folds.  There is normal propagation of the mucosal wave.  There are no sumbmucosal lesions noted.  There is complete glottic closure during sustained phonatory tasks. There is some supraglottic squeeze with phonation.    Impressions & Recommendations  Vocal tremor and mild polypoid changes on left vocal fold likely smoking  related.  No dysphagia or dysarthria at this time.  Modeled dysarthria techniques (e.g., word separation, inflection, exaggerated articulation) which may be dually effective to conceal her mild vocal tremor.  Follow-up as needed.      Thank you for your very kind referral.       Baldwin Crown, MA, [place], [facility]  Speech-Language Pathologist

## 2016-10-28 NOTE — Progress Notes (Signed)
[place] FOR VOICE AND SWALLOWING     Please see the corresponding resident note for full details.    I have seen the above patient, discussed the history, performed a physical exam, and was present for the entire endoscopy.  She recently moved from [place] and is interested in establishing care within the [facility] system.  She has a 30 year history of relapsing-remiting MS.  She has long standing hoarseness that she attributes to smoking.  She has a hiatal hernia and more recently had a number of choking events with solids.  Her dysphagia episodes are correlated with other symptoms including dysarthria.  She takes a monoclonal antibody.  Recently, in [place], she had a lymph node excision and tonsillectomy that noted IgG4.  Examination today is unremarkable including FEES and stroboscopy.  Overall, she looks quite good. I suspect voice changes and dysphagia are MS flare related.  I see no evidence today of IgG4 disease.  She will follow up with me if she notes changes in symptoms.

## 2016-10-29 ENCOUNTER — Encounter (INDEPENDENT_AMBULATORY_CARE_PROVIDER_SITE_OTHER): Payer: Self-pay | Admitting: Family Practice

## 2016-10-29 ENCOUNTER — Ambulatory Visit
Admission: RE | Admit: 2016-10-29 | Discharge: 2016-10-29 | Disposition: A | Discharge status: Home / Self Care | Payer: Medicare Other | Attending: Neurology | Admitting: Neurology

## 2016-10-29 DIAGNOSIS — G35 Multiple sclerosis: Principal | ICD-10-CM | POA: Insufficient documentation

## 2016-10-29 DIAGNOSIS — Z5112 Encounter for antineoplastic immunotherapy: Secondary | ICD-10-CM | POA: Insufficient documentation

## 2016-10-29 MED ORDER — SODIUM CHLORIDE 0.9 % IV SOLN
300.0000 mg | Freq: Once | INTRAVENOUS | Status: AC
Start: 2016-10-29 — End: 2016-10-29
  Administered 2016-10-29: 300 mg via INTRAVENOUS
  Filled 2016-10-29: qty 15

## 2016-10-29 MED ORDER — SODIUM CHLORIDE 0.9 % IV SOLN
INTRAVENOUS | Status: DC
Start: 2016-10-29 — End: 2016-11-02
  Administered 2016-10-29: 10:00:00 via INTRAVENOUS

## 2016-10-29 NOTE — Interdisciplinary (Signed)
Non-Chemotherapy Infusion Nursing Note - Walworth is a 56 year old female who presents for infusion of Tysabri.    Vitals:    10/29/16 0933   BP: 124/77   BP Location: Left arm   BP Patient Position: Sitting   Pulse: 79   Resp: 16   Temp: 99 F (37.2 C)   TempSrc: Oral   SpO2: 95%   Weight: 59.4 kg (131 lb)   Height: 5\' 4"  (1.626 m)     Pain Score: 0  Body surface area is 1.64 meters squared.  Body mass index is 22.49 kg/(m^2).    Pre-treatment nursing assessment:  No problems identified upon assessment.  Pt arrived ambulatory unaccompanied, pt with no concerns or complaints voiced today. Denies any new medication or new sx since last tx.  PIV started on L FA, flushed well with brisk blood return.  No labs req'd for tx today. No premeds indicated.  Competed pre-infusion pt checklist on Touch program and confirmed auth.    [name] tolerated treatment well.    Declines need for 1 hr post observation.  Post blood return: Brisk  Post-Flush: NS and Discontinued IV, site intact.  Pt will return in one month for next tx.    Medications   sodium chloride 0.9% infusion ( IntraVENOUS Stopped 10/29/16 1119)   natalizumab (TYSABRI) 300 mg in sodium chloride 0.9 % 100 mL IVPB (0 mg IntraVENOUS Completed 10/29/16 1111)     Patient Education  Learner: Patient  Barriers to learning: No Barriers  Readiness to learn: Acceptance  Method: Explanation    Treatment Education: Information/teaching given to patient including: signs and symptoms of infection, bleeding, adverse reaction(s), symptom control, and when to notify MD.    [name]: Instructed patient to call for assistance.    Pain Education: Patient instructed to contact nurse if pain should develop or if their current pain therapy becomes ineffective.    Response: Verbalizes understanding    Discharge Plan  Discharge instructions given to patient.  Future appointments given and reviewed with treatment plan.  Discharge  Mode: Ambulatory  Discharge Time: 1120  Accompanied by: Self  Discharged To: Home

## 2016-11-01 ENCOUNTER — Ambulatory Visit (INDEPENDENT_AMBULATORY_CARE_PROVIDER_SITE_OTHER): Payer: Medicare Other | Admitting: Ophthalmology

## 2016-11-03 ENCOUNTER — Ambulatory Visit (INDEPENDENT_AMBULATORY_CARE_PROVIDER_SITE_OTHER): Payer: Medicare Other | Admitting: Internal Medicine

## 2016-11-05 ENCOUNTER — Encounter (INDEPENDENT_AMBULATORY_CARE_PROVIDER_SITE_OTHER): Payer: Medicare Other | Admitting: Family Practice

## 2016-11-05 ENCOUNTER — Telehealth (HOSPITAL_BASED_OUTPATIENT_CLINIC_OR_DEPARTMENT_OTHER): Payer: Self-pay

## 2016-11-05 NOTE — Telephone Encounter (Signed)
TELEPHONE NOTE    This note was written by Genelle Gather, M.A., M.S., Psychology Intern, and reviewed by Edmonia Caprio, Ph.D., Licensed Clinical Psychologist.    Writer called Mena Debra Bolton on 11/05/16 to provide information about upcoming CBT group.     No contact. Left brief and discreet voicemail with call back number.

## 2016-11-08 ENCOUNTER — Telehealth (HOSPITAL_BASED_OUTPATIENT_CLINIC_OR_DEPARTMENT_OTHER): Payer: Self-pay

## 2016-11-08 NOTE — Telephone Encounter (Signed)
TELEPHONE NOTE    This note was written by Genelle Gather, M.A., M.S., Psychology Intern, and reviewed by Edmonia Caprio, Ph.D., Licensed Clinical Psychologist.    Writer called [name] on 11/08/16 to provide information about upcoming CBT group.     No contact. Left brief and discreet voicemail with call back number on both home answering machine and cell phone voicemail.

## 2016-11-10 ENCOUNTER — Telehealth (HOSPITAL_BASED_OUTPATIENT_CLINIC_OR_DEPARTMENT_OTHER): Payer: Self-pay

## 2016-11-10 NOTE — Telephone Encounter (Signed)
TELEPHONE NOTE    This note was written by Genelle Gather, M.A., M.S., Psychology Intern, and reviewed by Edmonia Caprio, Ph.D., Licensed Clinical Psychologist.    Writer called Debra Bolton on 11/10/16 to let the patient know to call us if she is still interested in our services.     No contact. Left brief and discreet voicemail with call back number.

## 2016-11-16 ENCOUNTER — Encounter (INDEPENDENT_AMBULATORY_CARE_PROVIDER_SITE_OTHER): Payer: Medicare Other | Admitting: Neurology

## 2016-11-17 ENCOUNTER — Encounter (INDEPENDENT_AMBULATORY_CARE_PROVIDER_SITE_OTHER): Payer: Self-pay | Admitting: Psychiatry

## 2016-11-17 ENCOUNTER — Encounter (INDEPENDENT_AMBULATORY_CARE_PROVIDER_SITE_OTHER): Payer: Self-pay | Admitting: Family Practice

## 2016-11-17 DIAGNOSIS — M25511 Pain in right shoulder: Principal | ICD-10-CM

## 2016-11-17 DIAGNOSIS — G8929 Other chronic pain: Principal | ICD-10-CM

## 2016-11-17 NOTE — Telephone Encounter (Signed)
From: Debra Bolton  To: Edi, Verl Blalock, MD  Sent: 11/17/2016 11:45 AM PDT  Subject: 20-Other    Dear Dr, Edi,    Had a bit of an upset which delayed some of my appointments,     My 58 year old brother died of a massive heart attack so I had to go to [place] for a while, and we sold and bought a new home so we have been moving.    I will reschedule the appts necessary and then reschedule with you when all the other Drs. information available.   Thanks for your patience and hope to get it all back together soon.  I also saw my shoulder xray results and wondered if there is anything I should be doing for the pain?    Appreciate you,  Thanks Debra Bolton

## 2016-11-18 DIAGNOSIS — G894 Chronic pain syndrome: Secondary | ICD-10-CM | POA: Diagnosis not present

## 2016-11-18 DIAGNOSIS — G5600 Carpal tunnel syndrome, unspecified upper limb: Secondary | ICD-10-CM | POA: Diagnosis not present

## 2016-11-18 DIAGNOSIS — M5136 Other intervertebral disc degeneration, lumbar region: Secondary | ICD-10-CM | POA: Diagnosis not present

## 2016-11-18 DIAGNOSIS — M546 Pain in thoracic spine: Secondary | ICD-10-CM | POA: Diagnosis not present

## 2016-11-18 DIAGNOSIS — F1120 Opioid dependence, uncomplicated: Secondary | ICD-10-CM | POA: Diagnosis not present

## 2016-11-18 DIAGNOSIS — Z720 Tobacco use: Secondary | ICD-10-CM | POA: Diagnosis not present

## 2016-11-18 DIAGNOSIS — Z8781 Personal history of (healed) traumatic fracture: Secondary | ICD-10-CM | POA: Diagnosis not present

## 2016-11-18 DIAGNOSIS — M542 Cervicalgia: Secondary | ICD-10-CM | POA: Diagnosis not present

## 2016-11-18 DIAGNOSIS — Z79891 Long term (current) use of opiate analgesic: Secondary | ICD-10-CM | POA: Diagnosis not present

## 2016-11-19 ENCOUNTER — Encounter (INDEPENDENT_AMBULATORY_CARE_PROVIDER_SITE_OTHER): Payer: Medicare Other | Admitting: Psychiatry

## 2016-11-26 ENCOUNTER — Ambulatory Visit
Admission: RE | Admit: 2016-11-26 | Discharge: 2016-11-29 | Disposition: A | Discharge status: Home / Self Care | Payer: Medicare Other | Attending: Neurology | Admitting: Neurology

## 2016-11-26 DIAGNOSIS — G35 Multiple sclerosis: Principal | ICD-10-CM | POA: Insufficient documentation

## 2016-11-26 MED ORDER — SODIUM CHLORIDE 0.9 % IV SOLN
INTRAVENOUS | Status: DC
Start: 2016-11-26 — End: 2016-11-29
  Administered 2016-11-26: 13:00:00 via INTRAVENOUS

## 2016-11-26 MED ORDER — SODIUM CHLORIDE 0.9 % IV SOLN
300.00 mg | Freq: Once | INTRAVENOUS | Status: AC
Start: 2016-11-26 — End: 2016-11-26
  Administered 2016-11-26: 300 mg via INTRAVENOUS
  Filled 2016-11-26: qty 15

## 2016-11-26 NOTE — Interdisciplinary (Signed)
Non-Chemotherapy Infusion Nursing Note -  Debra Bolton is a 56 year old female who presents for infusion of tysabri.  Medications   sodium chloride 0.9% infusion ( IntraVENOUS Stopped 11/26/16 1353)   natalizumab (TYSABRI) 300 mg in sodium chloride 0.9 % 100 mL IVPB (0 mg IntraVENOUS Completed 11/26/16 1453)     Vitals:    11/26/16 1310 11/26/16 1502   BP: 104/72 110/70   BP Location: Left arm Right arm   BP Patient Position:  Sitting   Pulse: 74 77   Resp: 16    Temp: 98.2 F (36.8 C)    TempSrc: Oral    SpO2: 99%    Weight: 58.8 kg (129 lb 9.6 oz)    Height: 5\' 4"  (1.626 m)      Pain Score: 0  Body surface area is 1.63 meters squared.  Body mass index is 22.25 kg/(m^2).    Pre-treatment nursing assessment:  No problems identified upon assessment.    Debra Bolton tolerated treatment well.    Post blood return: Brisk  Post-Flush: NS and Discontinued IV    Patient Education  Learner: Patient  Barriers to learning: No Barriers  Readiness to learn: Acceptance  Method: Explanation and Handout    Treatment Education: Information/teaching given to patient including: signs and symptoms of infection, bleeding, adverse reaction(s), symptom control, and when to notify MD.    [name]: Instructed patient to call for assistance.    Pain Education: Patient instructed to contact nurse if pain should develop or if their current pain therapy becomes ineffective.    Response: Verbalizes understanding    Discharge Plan  Discharge instructions given to patient.  Future appointments given and reviewed with treatment plan.  Discharge Mode: Ambulatory    Accompanied by: Self  Discharged To: Home

## 2016-11-29 DIAGNOSIS — F329 Major depressive disorder, single episode, unspecified: Secondary | ICD-10-CM | POA: Diagnosis not present

## 2016-11-29 DIAGNOSIS — F17219 Nicotine dependence, cigarettes, with unspecified nicotine-induced disorders: Secondary | ICD-10-CM | POA: Diagnosis not present

## 2016-11-29 DIAGNOSIS — I10 Essential (primary) hypertension: Secondary | ICD-10-CM | POA: Diagnosis not present

## 2016-11-29 DIAGNOSIS — I2510 Atherosclerotic heart disease of native coronary artery without angina pectoris: Secondary | ICD-10-CM | POA: Diagnosis not present

## 2016-11-29 DIAGNOSIS — E782 Mixed hyperlipidemia: Secondary | ICD-10-CM | POA: Diagnosis not present

## 2016-11-29 DIAGNOSIS — J449 Chronic obstructive pulmonary disease, unspecified: Secondary | ICD-10-CM | POA: Diagnosis not present

## 2016-12-02 DIAGNOSIS — I10 Essential (primary) hypertension: Secondary | ICD-10-CM | POA: Diagnosis not present

## 2016-12-02 DIAGNOSIS — Z951 Presence of aortocoronary bypass graft: Secondary | ICD-10-CM | POA: Insufficient documentation

## 2016-12-02 DIAGNOSIS — E785 Hyperlipidemia, unspecified: Secondary | ICD-10-CM | POA: Diagnosis not present

## 2016-12-02 DIAGNOSIS — I2510 Atherosclerotic heart disease of native coronary artery without angina pectoris: Secondary | ICD-10-CM | POA: Diagnosis not present

## 2016-12-21 DIAGNOSIS — M542 Cervicalgia: Secondary | ICD-10-CM | POA: Diagnosis not present

## 2016-12-21 DIAGNOSIS — F1120 Opioid dependence, uncomplicated: Secondary | ICD-10-CM | POA: Diagnosis not present

## 2016-12-21 DIAGNOSIS — M5136 Other intervertebral disc degeneration, lumbar region: Secondary | ICD-10-CM | POA: Diagnosis not present

## 2016-12-21 DIAGNOSIS — G5600 Carpal tunnel syndrome, unspecified upper limb: Secondary | ICD-10-CM | POA: Diagnosis not present

## 2016-12-21 DIAGNOSIS — Z8781 Personal history of (healed) traumatic fracture: Secondary | ICD-10-CM | POA: Diagnosis not present

## 2016-12-21 DIAGNOSIS — Z79891 Long term (current) use of opiate analgesic: Secondary | ICD-10-CM | POA: Diagnosis not present

## 2016-12-21 DIAGNOSIS — M546 Pain in thoracic spine: Secondary | ICD-10-CM | POA: Diagnosis not present

## 2016-12-21 DIAGNOSIS — Z720 Tobacco use: Secondary | ICD-10-CM | POA: Diagnosis not present

## 2016-12-21 DIAGNOSIS — G894 Chronic pain syndrome: Secondary | ICD-10-CM | POA: Diagnosis not present

## 2016-12-22 ENCOUNTER — Encounter (INDEPENDENT_AMBULATORY_CARE_PROVIDER_SITE_OTHER): Payer: Medicare Other | Admitting: Psychiatry

## 2016-12-23 ENCOUNTER — Telehealth (HOSPITAL_BASED_OUTPATIENT_CLINIC_OR_DEPARTMENT_OTHER): Payer: Self-pay | Admitting: Neurology

## 2016-12-23 ENCOUNTER — Encounter (INDEPENDENT_AMBULATORY_CARE_PROVIDER_SITE_OTHER): Payer: Self-pay | Admitting: Psychiatry

## 2016-12-23 NOTE — Telephone Encounter (Signed)
Pt called our [place] office to r/s TYSABRI on 12/24/16. Reasoning for cancellation is due to transportation problem. We were able to r/s apt to Monday 01/03/17.

## 2016-12-24 ENCOUNTER — Ambulatory Visit (HOSPITAL_BASED_OUTPATIENT_CLINIC_OR_DEPARTMENT_OTHER): Admit: 2016-12-24 | Payer: Medicare Other

## 2016-12-27 NOTE — Telephone Encounter (Signed)
From: [name]  To: Deatra James, MD  Sent: 12/23/2016 3:28 PM PDT  Subject: 1-Non Urgent Medical Advice    Dear Dr. Madolyn Frieze,   I have an appt. scheduled for September 26th to continue our work.  I was wondering if I am not taking a antidepressant that I should be.  I am taking cymbalta 163mcg, but isn't this to boost another script? When Dr. Rosendo Gros thought I shouldn't be taking anything,I got off all the drugs until I saw you and the pain clinic... I was taking sertraline 100 mg 2x, and nortriptyline 25mg  2x a day. Am I supposed to be taking one of these still? My depression seems ok, but my nerve pain...has been quite uncontrollable, and it can quickly bring me downward.   I just am not sure I didn't get back on something important.    I am also out of my script for tizanidine which I have been taking 6 mg ( 1 1/2 tabs at [facility]) The pharmacy I use now is [facility] [address] [place] [id number].    I appreciate your help with this, let me know what to do, see you soon.  [name] [phone]

## 2016-12-28 ENCOUNTER — Encounter (INDEPENDENT_AMBULATORY_CARE_PROVIDER_SITE_OTHER): Payer: Self-pay | Admitting: Psychiatry

## 2016-12-28 DIAGNOSIS — R252 Cramp and spasm: Principal | ICD-10-CM

## 2016-12-28 NOTE — Telephone Encounter (Signed)
From: Debra Bolton  To: Deatra James, MD  Sent: 12/28/2016 10:20 AM PDT  Subject: 1-Non Urgent Medical Advice    thank you,  All I need called in right now is the t[facility]anidine tabs 6mg  [facility] [place] [phone]. I have been taking 1 1/2 of the 4mg  at night for the nightmares.    Thanks Debra Bolton  ----- Message -----  From: Deatra James, MD  Sent: 12/27/2016 1:02 PM PDT  To: Debra Bolton  Subject: RE: 1-Non Urgent Medical Advice    You are supposed to be taking Duloxetine (= Cymbalta) 120 mg/day and Nortriptyline 25 mg x2/day. If you are out of these meds I will reorder them for you.  AP    ----- Message -----   From: Debra Bolton   Sent: 12/23/2016 3:28 PM PDT   To: Deatra James, MD  Subject: 1-Non Urgent Medical Advice    Dear Dr. Madolyn Frieze,   I have an appt. scheduled for September 26th to continue our work.  I was wondering if I am not taking a antidepressant that I should be.  I am taking cymbalta 131mcg, but isn't this to boost another script? When Dr. Rosendo Gros thought I shouldn't be taking anything,I got off all the drugs until I saw you and the pain clinic... I was taking sertraline 100 mg 2x, and nortriptyline 25mg  2x a day. Am I supposed to be taking one of these still? My depression seems ok, but my nerve pain...has been quite uncontrollable, and it can quickly bring me downward.   I just am not sure I didn't get back on something important.    I am also out of my script for tizanidine which I have been taking 6 mg ( 1 1/2 tabs at [facility]) The pharmacy [facility] use [facility] [address] [place] [id number].    I appreciate your help with this, let me know what to do, see you soon.  Debra Bolton [phone]

## 2016-12-30 MED ORDER — TIZANIDINE HCL 4 MG OR TABS
4.0000 mg | ORAL_TABLET | Freq: Every evening | ORAL | 2 refills | Status: DC
Start: 2016-12-30 — End: 2017-03-29

## 2017-01-03 ENCOUNTER — Ambulatory Visit
Admission: RE | Admit: 2017-01-03 | Discharge: 2017-01-04 | Disposition: A | Discharge status: Home / Self Care | Payer: Medicare Other | Attending: Neurology | Admitting: Neurology

## 2017-01-03 DIAGNOSIS — G35 Multiple sclerosis: Principal | ICD-10-CM | POA: Insufficient documentation

## 2017-01-03 MED ORDER — SODIUM CHLORIDE 0.9 % IV SOLN
INTRAVENOUS | Status: DC
Start: 2017-01-03 — End: 2017-01-04
  Administered 2017-01-03: 14:00:00 via INTRAVENOUS

## 2017-01-03 MED ORDER — SODIUM CHLORIDE 0.9 % IV SOLN
300.0000 mg | Freq: Once | INTRAVENOUS | Status: AC
Start: 2017-01-03 — End: 2017-01-03
  Administered 2017-01-03: 300 mg via INTRAVENOUS
  Filled 2017-01-03: qty 15

## 2017-01-03 NOTE — Interdisciplinary (Signed)
Non-Chemotherapy Infusion Nursing Note -  Debra Bolton is a 56 year old female who presents for infusion of Tysabri.      Vitals:    01/03/17 1307 01/03/17 1455   BP: 124/77 119/78   BP Location: Left arm Right arm   BP Patient Position: Sitting Sitting   Pulse: 77 76   Resp: 16    Temp: 98.2 F (36.8 C)    TempSrc: Oral    SpO2: 100%    Weight: 61.2 kg (135 lb)    Height: 5\' 4"  (1.626 m)      Pain Score: 3  Body surface area is 1.66 meters squared.  Body mass index is 23.17 kg/(m^2).    Pre-treatment nursing assessment:  No problems identified upon assessment.  JC Virus drawn today.  Pt has no changes per pre tysabri questionnaire.  Touch program updated.    Debra Bolton tolerated Tysabri 300mg  over an hour treatment well.    Post blood return: Brisk  Post-Flush: NS and Discontinued IV  Medications   sodium chloride 0.9% infusion ( IntraVENOUS Stopped 01/03/17 1515)   natalizumab (TYSABRI) 300 mg in sodium chloride 0.9 % 100 mL IVPB (0 mg IntraVENOUS Stopped 01/03/17 1455)       Patient Education  Learner: Patient  Barriers to learning: No Barriers  Readiness to learn: Acceptance  Method: Explanation    Treatment Education: Information/teaching given to patient including: signs and symptoms of infection, bleeding, adverse reaction(s), symptom control, and when to notify MD.    [name]: Instructed patient to call for assistance.    Pain Education: Patient instructed to contact nurse if pain should develop or if their current pain therapy becomes ineffective.    Response: Verbalizes understanding    Discharge Plan  Discharge instructions given to patient.  Future appointments given and reviewed with treatment plan.  Discharge Mode: Ambulatory  Discharge Time: 1518  Accompanied by: Self  Discharged To: Home

## 2017-01-08 ENCOUNTER — Encounter (INDEPENDENT_AMBULATORY_CARE_PROVIDER_SITE_OTHER): Payer: Self-pay | Admitting: Ophthalmology

## 2017-01-08 ENCOUNTER — Ambulatory Visit (INDEPENDENT_AMBULATORY_CARE_PROVIDER_SITE_OTHER): Payer: Medicare Other | Admitting: Ophthalmology

## 2017-01-08 DIAGNOSIS — H2000 Unspecified acute and subacute iridocyclitis: Secondary | ICD-10-CM

## 2017-01-08 NOTE — Progress Notes (Signed)
Ophthalmology Attending Note:    HPI:  56 yo female with h/o of MS treated with Tysabri, also h/o of IGG4 disease, has h/o of eye inflammation one year ago, took drops for this, stoppped one year ago.    Also reports h/o of poor vision in right eye since childhool    Here for evaluation for uveitis.      Exam:  Vision 20/160 PI to 20/100 OD, 20/20 OS    IOP: wnl OU    Exam:    OD: remarkable for 2+ keratic precipitates OD, mild posterior subcapsular cataract, 1+ cell.    OS: unremarkable with phakic lens.      Fundus exam:  Normal cup to disc ratio without disc edema, macular periphery, vessels wnl    Imaging:  OCT shows no macular edema.    A/P:    H/o of acute uveitis OD: likely resolved.  Likely a h/o of a acute uveitis episode which is now resolved.  Could be related to MS or other underlying autoimmune diseases vs. Idiopathic.    1. Recommend predforte 1% gtt QID in right eye for one month.  2. Consider Quantiferon, HLA-B27, CXR (sarcoid and TB), and other systemic workup but will defer to rheumatology.  I don't feel strongly about a laboratory workup at this time.    3. Referral for cataract surgery and refraction OD.  Likely has a h/o of amblyopia OD as well.    I will see her in one month.

## 2017-01-11 LAB — JCV AB (WITH INDEX) WITH/REFLEX TO INHIBITION ASSAY

## 2017-01-12 ENCOUNTER — Ambulatory Visit (INDEPENDENT_AMBULATORY_CARE_PROVIDER_SITE_OTHER): Payer: Medicare Other | Admitting: Psychiatry

## 2017-01-12 ENCOUNTER — Ambulatory Visit (INDEPENDENT_AMBULATORY_CARE_PROVIDER_SITE_OTHER): Payer: Medicare Other | Admitting: Internal Medicine

## 2017-01-12 ENCOUNTER — Other Ambulatory Visit (INDEPENDENT_AMBULATORY_CARE_PROVIDER_SITE_OTHER): Payer: Medicare Other | Attending: Internal Medicine

## 2017-01-12 ENCOUNTER — Encounter (INDEPENDENT_AMBULATORY_CARE_PROVIDER_SITE_OTHER): Payer: Self-pay | Admitting: Psychiatry

## 2017-01-12 ENCOUNTER — Encounter (INDEPENDENT_AMBULATORY_CARE_PROVIDER_SITE_OTHER): Payer: Self-pay | Admitting: Internal Medicine

## 2017-01-12 VITALS — BP 114/76 | HR 80 | Temp 97.9°F | Resp 16 | Wt 136.1 lb

## 2017-01-12 VITALS — BP 118/74 | HR 84 | Temp 99.0°F

## 2017-01-12 DIAGNOSIS — G35 Multiple sclerosis: Principal | ICD-10-CM

## 2017-01-12 DIAGNOSIS — M7551 Bursitis of right shoulder: Secondary | ICD-10-CM

## 2017-01-12 DIAGNOSIS — D8989 Other specified disorders involving the immune mechanism, not elsewhere classified: Secondary | ICD-10-CM | POA: Insufficient documentation

## 2017-01-12 DIAGNOSIS — H209 Unspecified iridocyclitis: Secondary | ICD-10-CM

## 2017-01-12 DIAGNOSIS — G894 Chronic pain syndrome: Secondary | ICD-10-CM

## 2017-01-12 DIAGNOSIS — R590 Localized enlarged lymph nodes: Secondary | ICD-10-CM

## 2017-01-12 DIAGNOSIS — F339 Major depressive disorder, recurrent, unspecified: Secondary | ICD-10-CM

## 2017-01-12 DIAGNOSIS — F4310 Post-traumatic stress disorder, unspecified: Principal | ICD-10-CM

## 2017-01-12 DIAGNOSIS — F39 Unspecified mood [affective] disorder: Secondary | ICD-10-CM

## 2017-01-12 DIAGNOSIS — M797 Fibromyalgia: Secondary | ICD-10-CM

## 2017-01-12 LAB — C3, BLOOD: C3: 92 mg/dL (ref 90–180)

## 2017-01-12 LAB — C4, BLOOD: C4: 18 mg/dL (ref 10–40)

## 2017-01-12 LAB — IGG, BLOOD: IGG: 1080 mg/dL (ref 700–1600)

## 2017-01-12 MED ORDER — NORTRIPTYLINE HCL 25 MG OR CAPS
50.0000 mg | ORAL_CAPSULE | Freq: Every evening | ORAL | 0 refills | Status: DC
Start: 2017-01-12 — End: 2017-01-12

## 2017-01-12 MED ORDER — NORTRIPTYLINE HCL 25 MG OR CAPS
50.0000 mg | ORAL_CAPSULE | Freq: Every evening | ORAL | 2 refills | Status: DC
Start: 2017-01-12 — End: 2017-03-29

## 2017-01-12 MED ORDER — DULOXETINE HCL 60 MG OR CPEP
120.0000 mg | ORAL_CAPSULE | Freq: Every day | ORAL | 1 refills | Status: DC
Start: 2017-01-12 — End: 2017-01-12

## 2017-01-12 MED ORDER — DULOXETINE HCL 60 MG OR CPEP
120.0000 mg | ORAL_CAPSULE | Freq: Every day | ORAL | 2 refills | Status: DC
Start: 2017-01-12 — End: 2017-03-29
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
## 2017-01-12 NOTE — Progress Notes (Signed)
Referring Provider:  Edi, Rina S, MD  [id number]  [place], [place] [address]    Chief Complaint  Chief Complaint   Patient presents with   • Rheumatoid Problem   1. Multiple Sclerosis, relapsing form  2. Anterior uveitis  3. IgG related lymphadenopathy    History of present Illness  56 year old female with history of multiple sclerosis diagnosed 1991, presented with numbness in both legs, seizures of 4 years duration.  Multiple relapses since then.  Previously treated with Copaxone.  Started Natalizumab in 2014 with good response and improvement in pain and numbness in the extremities.  Currently taking Cymbalta and gabapentin which appear to help with pain in her extremities.  Chronic right shoulder pain secondary to previous injury, no previous bursa injections.  Pain worse with use and lifting heavy with the right arm.  No other joints with persistent pain swelling or morning stiffness at this time.  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  Seen by Ophthalmology at [facility], considering workup for systemic autoimmune disease.  Previous labs from [place] from April 2017 revealed normal SSA, SSB, QuantiFERON TB, cell counts, CMP, inflammatory markers.    She is a chronic heavy smoker, about a year ago she had change in voice since cervical lymphadenopathy.  Underwent removal of enlarged tonsil and cervical lymph node to rule out malignancy.  Biopsy was negative for malignancy, however suggested IgG4 related disease with 50 cells per high-power field in lymph node with IgG4 staining.  Workup for systemic disease was done with CT chest abdomen pelvis 08/11/2015 which was negative for lymphadenopathy and for retroperitoneal fibrosis.  No pancreatitis.    Skin: No photosensitivity, rash or ulceration. No skin psoriasis  Hair: No unusual hair loss  Oral: No oral ulceration or sore throat   Eyes:  Per HPI  Ears: No changes in hearing, ear pain or  discharge  Abd: No discomfort. No change in bowel habits  Resp: No difficulty breathing, chest pain or chronic cough  Genitourinary: No difficulty or pain with urination  Systemic: No fever, malaise, changes in weight or appetite. No h/o recurrent infections  Neurological:  Per HPI  Extremities: No h/o Raynaud's phenomenon. No other joints with swelling, pain or redness    Review of Systems  As per HPI    Medications and Allergies were reviewed with the patient.       Current Outpatient Prescriptions:   •  baclofen (LIORESAL) 10 MG tablet, Take 1 tablet (10 mg) by mouth daily., Disp: , Rfl: 0  •  diazepam (VALIUM) 5 MG tablet, Take 1 tablet (5 mg) by mouth once a week., Disp: , Rfl:   •  DULoxetine (CYMBALTA) 60 MG CR capsule, Take 2 capsules (120 mg) by mouth daily., Disp: 60 capsule, Rfl: 2  •  gabapentin (NEURONTIN) 300 MG capsule, Take 2 capsules (600 mg) by mouth nightly., Disp: , Rfl:   •  nortriptyline (PAMELOR) 25 MG capsule, Take 2 capsules (50 mg) by mouth nightly., Disp: 60 capsule, Rfl: 2  •  thyroid (ARMOUR THYROID) 15 MG tablet, Take 1 tablet (15 mg) by mouth daily., Disp: 90 tablet, Rfl: 2  •  thyroid (ARMOUR THYROID) 60 MG tablet, Take 1 tablet (60 mg) by mouth daily., Disp: 90 tablet, Rfl: 3  •  tiZANidine (ZANAFLEX) 4 MG tablet, Take 1 tablet (4 mg) by mouth at bedtime., Disp: 45 tablet, Rfl: 2      Allergies  Penicillins and Modafinil    Past Medical History:   Diagnosis Date   • Asthma 1991    with pregnancy   • Hypothyroidism    • Major depressive disorder, single episode    • MS (multiple sclerosis) (CMS-HCC)        Past Surgical History:   Procedure Laterality Date   • LYMPH NODE BIOPSY      IgG4 related disease   • TONSILLECTOMY AND ADENOIDECTOMY      only tonsillectomy       Family History   Problem Relation Age of Onset   • Other Mother    • Thyroid Mother    • Cancer Father      lung   • Cancer Brother    • Cancer Sister      breast   • Diabetes Sister    • Stroke Brother        Social  History     Social History   • Marital status: Divorced     Spouse name: N/A   • Number of children: N/A   • Years of education: N/A     Occupational History   • Not on file.     Social History Main Topics   • Smoking status: Former Smoker     Packs/day: 1.00     Years: 26.00     Types: Cigarettes     Quit date: 02/01/2016   • Smokeless tobacco: Never Used   • Alcohol use 10.0 oz/week     4 Glasses of wine per week   • Drug use: Not on file   • Sexual activity: Yes     Partners: Male     Other Topics Concern   • Not on file     Social History Narrative    Moved from [place]    Diet is great, high fiber, she is very conscientious of effect of diet on health.    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    Hasn't had a cigarette in 8 months.    Has smoked 26 years, less than a pack a day.    Currently drinks 2-4 drinks a day on the weekends, total 4-8 per week.    Sexually active with one female partner.    Currently not working, has been on disability for most of the time from 1992 to now.         Physical Exam  Vitals:    01/12/17 1320   BP: 118/74   BP Location: Left arm   BP Patient Position: Sitting   BP cuff size: Regular   Pulse: 84   Temp: 99 °F (37.2 °C)   TempSrc: Oral     Gen: Alert, cooperative  Psych: Appropriate affect  Neck: No cervical lymphadenopathy  Skin: No rheumatologic rashes or ulcers  Eyes: No conjunctival erythema, EOMI. No scleral icterus  ENT: No oral mucositis, no sinus tenderness, no ear discharge  CV: Regular rhythm, normal rate  Resp: Breathing comfortably, no tachypnea  Abd: Soft, non-tender  Joint exam: A complete joint exam revealed mild pain on resisted abduction of right shoulder consistent with right subacromial bursitis. No active synovitis in upper or lower extremity joints. Good range of motion in shoulder and pelvic girdles  Neuro: Oriented to time, place and person         Most recent labs and imaging were reviewed and discussed with the patient.  50 pages of outside records reviewed,    outlined in HPI    08/08/2015  IgG4 level 215 upper limit 153m/dl  Total IgG 1050 upper limit 1590    Impression    ICD-10-CM ICD-9-CM   1. Multiple sclerosis (CMS-HCC) G35 340   2. 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 Lymphadenopathy, cervical R59.0 785.6   4. Uveitis, anterior H20.9 364.3   5. Subacromial bursitis of right shoulder joint M75.51 726.19       Debra Bolton a 56year old female with multiple sclerosis, followed by Neurology, on treatment with Natalizumab every 4 weeks.  Well controlled.  Chronic nerve pain well controlled with Cymbalta and gabapentin.    Noted to have IgG4 related disease on histopathology of enlarged right cervical lymph nodes.  No persistent lymphadenopathy, no pancreatitis, no other systemic lymphadenopathy on CT chest abdomen pelvis performed April 2017.  Plan to continue close monitoring for recurrence of lymphadenopathy and consider switching to rituximab if progressive disease noted, if okay with Neurology from a multiple sclerosis standpoint.    History 2 episodes of anterior uveitis in the last 1 year.  Plan to workup for underlying systemic autoimmune condition.  Negative QuantiFERON TB, SSA and SSB    PLAN:  - labs as ordered below to rule out secondary cause of uveitis  - continue treatment of multiple sclerosis per Neurology.  Monitor IgG and IgG4 levels, if rising or worsening adenopathy noted, consider switching to rituximab, if okay with Neurology.  No clear indication for treatment of incidentally found IgG4 related disease at this time  - consider starting methotrexate for prevention of recurrent uveitis, if continues to recur  - right subacromial bursa corticosteroid injection in clinic next week    Orders Placed This Encounter   Procedures    ANA (Anti-Nuclear Antibody) [place]    Smith Antibody [place]    RNP (Ribonuclear Protein Antibody)    Anti-DS-DNA [place]    C3, Blood Green Plasma Separator Tube    C4, Blood Green  Plasma Separator Tube    HLA B27 Typing Yellow ACD    Immunoglobulin G Subclass 4 Yellow serum separator tube    IgG, Blood - See Instructions        Requested Prescriptions      No prescriptions requested or ordered in this encounter       Return in about 3 months (around 04/13/2017).

## 2017-01-13 LAB — RNP (RIBONUCLEICPROTEIN AB), BLOOD: RNP (Ribonucloprotein Ab): 1 U/mL (ref 0.0–4.9)

## 2017-01-13 LAB — SMITH ANTIBODY, BLOOD: Smith Ab: 2.5 U/mL (ref 0.0–6.9)

## 2017-01-14 LAB — ANTI-DSDNA, BLOOD: Anti-DSDNA: 16 IU (ref 0–24)

## 2017-01-14 LAB — ANA (ANTI-NUCLEAR AB), BLOOD: ANA (Anti-Nuclear Ab): NEGATIVE

## 2017-01-14 LAB — IMMUNOGLOBULIN G SUBCLASS 4: IGG Subclass 4: 193 mg/dL — ABNORMAL HIGH (ref 1–123)

## 2017-01-16 LAB — HLA B27 TYPING

## 2017-01-18 DIAGNOSIS — F1120 Opioid dependence, uncomplicated: Secondary | ICD-10-CM | POA: Diagnosis not present

## 2017-01-18 DIAGNOSIS — M5136 Other intervertebral disc degeneration, lumbar region: Secondary | ICD-10-CM | POA: Diagnosis not present

## 2017-01-18 DIAGNOSIS — G894 Chronic pain syndrome: Secondary | ICD-10-CM | POA: Diagnosis not present

## 2017-01-18 DIAGNOSIS — M542 Cervicalgia: Secondary | ICD-10-CM | POA: Diagnosis not present

## 2017-01-18 DIAGNOSIS — G5600 Carpal tunnel syndrome, unspecified upper limb: Secondary | ICD-10-CM | POA: Diagnosis not present

## 2017-01-18 DIAGNOSIS — M546 Pain in thoracic spine: Secondary | ICD-10-CM | POA: Diagnosis not present

## 2017-01-18 DIAGNOSIS — Z8781 Personal history of (healed) traumatic fracture: Secondary | ICD-10-CM | POA: Diagnosis not present

## 2017-01-18 DIAGNOSIS — Z720 Tobacco use: Secondary | ICD-10-CM | POA: Diagnosis not present

## 2017-01-18 DIAGNOSIS — Z79891 Long term (current) use of opiate analgesic: Secondary | ICD-10-CM | POA: Diagnosis not present

## 2017-01-19 ENCOUNTER — Encounter (INDEPENDENT_AMBULATORY_CARE_PROVIDER_SITE_OTHER): Payer: Medicare Other | Admitting: Family Practice

## 2017-01-19 ENCOUNTER — Encounter (INDEPENDENT_AMBULATORY_CARE_PROVIDER_SITE_OTHER): Payer: Self-pay | Admitting: Internal Medicine

## 2017-01-19 ENCOUNTER — Ambulatory Visit (INDEPENDENT_AMBULATORY_CARE_PROVIDER_SITE_OTHER): Payer: Medicare Other | Admitting: Family Practice

## 2017-01-19 ENCOUNTER — Other Ambulatory Visit: Payer: Medicare Other | Attending: Family Practice

## 2017-01-19 ENCOUNTER — Ambulatory Visit (INDEPENDENT_AMBULATORY_CARE_PROVIDER_SITE_OTHER): Payer: Medicare Other | Admitting: Internal Medicine

## 2017-01-19 VITALS — BP 116/77 | HR 98 | Temp 99.3°F | Ht 64.0 in | Wt 134.3 lb

## 2017-01-19 VITALS — BP 125/83 | HR 81 | Temp 99.0°F

## 2017-01-19 DIAGNOSIS — Z124 Encounter for screening for malignant neoplasm of cervix: Secondary | ICD-10-CM

## 2017-01-19 DIAGNOSIS — Z113 Encounter for screening for infections with a predominantly sexual mode of transmission: Principal | ICD-10-CM | POA: Insufficient documentation

## 2017-01-19 DIAGNOSIS — G35 Multiple sclerosis: Secondary | ICD-10-CM

## 2017-01-19 DIAGNOSIS — Z1211 Encounter for screening for malignant neoplasm of colon: Secondary | ICD-10-CM

## 2017-01-19 DIAGNOSIS — M7551 Bursitis of right shoulder: Principal | ICD-10-CM

## 2017-01-19 DIAGNOSIS — D8989 Other specified disorders involving the immune mechanism, not elsewhere classified: Secondary | ICD-10-CM

## 2017-01-19 DIAGNOSIS — Z23 Encounter for immunization: Principal | ICD-10-CM

## 2017-01-19 DIAGNOSIS — H209 Unspecified iridocyclitis: Secondary | ICD-10-CM

## 2017-01-19 LAB — HEPATITIS C AB, BLOOD: Hepatitis C Ab: NONREACTIVE

## 2017-01-19 LAB — HEPATITIS B SURFACE AG, BLOOD: HBsAg: NONREACTIVE

## 2017-01-19 LAB — HIV 1/2 ANTIBODY & P24 ANTIGEN ASSAY, BLOOD: HIV 1/2 Antibody & P24 Antigen Assay: NONREACTIVE

## 2017-01-19 LAB — HEPATITIS B SURFACE AB, QUANT, BLOOD: HBsAb,Qt: <3.5 m[IU]/mL
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
## 2017-01-19 NOTE — Progress Notes (Signed)
Referring Provider:  Edi, Rina S, MD  [address] / [address]  [place], [place] [address]    Chief Complaint  Chief Complaint   Patient presents with   • Recheck     right shoulder injection   1. Multiple Sclerosis, relapsing form  2. Anterior uveitis  3. IgG related lymphadenopathy    History of present Illness  56 year old female with history of multiple sclerosis diagnosed 1991, presented with numbness in both legs, seizures of 4 years duration.  Multiple relapses since then.  Previously treated with Copaxone.  Started Natalizumab in 2014 with good response and improvement in pain and numbness in the extremities.  Currently taking Cymbalta and gabapentin which appear to help with pain in her extremities.  Chronic right shoulder pain secondary to previous injury, no previous bursa injections.  Pain worse with use and lifting heavy with the right arm.  No other joints with persistent pain swelling or morning stiffness at this time.  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  Seen by Ophthalmology at [facility], considering workup for systemic autoimmune disease.  Previous labs from [place] from April 2017 revealed normal SSA, SSB, QuantiFERON TB, cell counts, CMP, inflammatory markers.    She is a chronic heavy smoker, about a year ago she had change in voice since cervical lymphadenopathy.  Underwent removal of enlarged tonsil and cervical lymph node to rule out malignancy.  Biopsy was negative for malignancy, however suggested IgG4 related disease with 50 cells per high-power field in lymph node with IgG4 staining.  Workup for systemic disease was done with CT chest abdomen pelvis 08/11/2015 which was negative for lymphadenopathy and for retroperitoneal fibrosis.  No pancreatitis.  Persistent right shoulder pain worse in the last few months, worse with use, particularly lifting above horizontal request corticosteroid injection.  No nighttime pain or morning  stiffness.    Skin: No photosensitivity, rash or ulceration. No skin psoriasis  Hair: No unusual hair loss  Oral: No oral ulceration or sore throat   Eyes:  Per HPI  Ears: No changes in hearing, ear pain or discharge  Abd: No discomfort. No change in bowel habits  Resp: No difficulty breathing, chest pain or chronic cough  Genitourinary: No difficulty or pain with urination  Systemic: No fever, malaise, changes in weight or appetite. No h/o recurrent infections  Neurological:  Per HPI  Extremities: No h/o Raynaud's phenomenon. No other joints with swelling, pain or redness    Review of Systems  As per HPI    Medications and Allergies were reviewed with the patient.       Current Outpatient Prescriptions:   •  baclofen (LIORESAL) 10 MG tablet, Take 1 tablet (10 mg) by mouth daily., Disp: , Rfl: 0  •  diazepam (VALIUM) 5 MG tablet, Take 1 tablet (5 mg) by mouth once a week., Disp: , Rfl:   •  DULoxetine (CYMBALTA) 60 MG CR capsule, Take 2 capsules (120 mg) by mouth daily., Disp: 60 capsule, Rfl: 2  •  gabapentin (NEURONTIN) 300 MG capsule, Take 2 capsules (600 mg) by mouth nightly., Disp: , Rfl:   •  nortriptyline (PAMELOR) 25 MG capsule, Take 2 capsules (50 mg) by mouth nightly., Disp: 60 capsule, Rfl: 2  •  thyroid (ARMOUR THYROID) 15 MG tablet, Take 1 tablet (15 mg) by mouth daily., Disp: 90 tablet, Rfl: 2  •  thyroid (ARMOUR THYROID) 60 MG tablet, Take 1   tablet (60 mg) by mouth daily., Disp: 90 tablet, Rfl: 3  •  tiZANidine (ZANAFLEX) 4 MG tablet, Take 1 tablet (4 mg) by mouth at bedtime., Disp: 45 tablet, Rfl: 2    Allergies  Penicillins and Modafinil    Past Medical History:   Diagnosis Date   • Asthma 1991    with pregnancy   • Hypothyroidism    • Major depressive disorder, single episode    • MS (multiple sclerosis) (CMS-HCC)        Past Surgical History:   Procedure Laterality Date   • LYMPH NODE BIOPSY      IgG4 related disease   • TONSILLECTOMY AND ADENOIDECTOMY      only tonsillectomy       Family History    Problem Relation Age of Onset   • Other Mother    • Thyroid Mother    • Cancer Father      lung   • Cancer Brother    • Cancer Sister      breast   • Diabetes Sister    • Stroke Brother        Social History     Social History   • Marital status: Divorced     Spouse name: N/A   • Number of children: N/A   • Years of education: N/A     Occupational History   • Not on file.     Social History Main Topics   • Smoking status: Former Smoker     Packs/day: 1.00     Years: 26.00     Types: Cigarettes     Quit date: 02/01/2016   • Smokeless tobacco: Never Used   • Alcohol use 10.0 oz/week     4 Glasses of wine per week   • Drug use: Not on file   • Sexual activity: Yes     Partners: Male     Other Topics Concern   • Not on file     Social History Narrative    Moved from [place]    Diet is great, high fiber, she is very conscientious of effect of diet on health.    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    Hasn't had a cigarette in 8 months.    Has smoked 26 years, less than a pack a day.    Currently drinks 2-4 drinks a day on the weekends, total 4-8 per week.    Sexually active with one female partner.    Currently not working, has been on disability for most of the time from 1992 to now.         Physical Exam  Vitals:    01/19/17 0851   BP: 125/83   BP Location: Left arm   BP Patient Position: Sitting   BP cuff size: Regular   Pulse: 81   Temp: 99 °F (37.2 °C)   TempSrc: Oral     Gen: Alert, cooperative  Psych: Appropriate affect  Neck: No cervical lymphadenopathy  Skin: No rheumatologic rashes or ulcers  Eyes: No conjunctival erythema, EOMI. No scleral icterus  ENT: No oral mucositis, no sinus tenderness, no ear discharge  CV: Regular rhythm, normal rate  Resp: Breathing comfortably, no tachypnea  Abd: Soft, non-tender  Joint exam: A complete joint exam revealed mild pain on resisted abduction of right shoulder consistent with right subacromial bursitis. No active synovitis in upper or lower extremity joints. Good range of motion  in shoulder and   pelvic girdles  Neuro: Oriented to time, place and person         Most recent labs and imaging were reviewed and discussed with the patient.  50 pages of outside records reviewed, outlined in HPI    08/08/2015  IgG4 level 215 upper limit 118m/dl  Total IgG 1050 upper limit 1590    01/12/17  IgG4 level 193    Impression    ICD-10-CM ICD-9-CM   1. Subacromial bursitis of right shoulder joint M75.51 726.19   2. Multiple sclerosis (CMS-HCC) G35 340   3. IgG4 related disease (CMS-HCC) D89.89 279.8   4. Anterior uveitis H20.9 364.3       Debra Bolton a 56year old female with multiple sclerosis, followed by Neurology, on treatment with Natalizumab every 4 weeks.  Well controlled.  Chronic nerve pain well controlled with Cymbalta and gabapentin.    Noted to have IgG4 related disease on histopathology of enlarged right cervical lymph nodes.  No persistent lymphadenopathy, no pancreatitis, no other systemic lymphadenopathy on CT chest abdomen pelvis performed April 2017.  Plan to continue close monitoring for recurrence of lymphadenopathy and consider switching to rituximab if progressive disease noted, if okay with Neurology from a multiple sclerosis standpoint.    History 2 episodes of anterior uveitis in the last 1 year.  Negative ANA HLA B27, Qft TB    PLAN:  - discuss results of most recent lab tests with the patient.  negative workup for secondary cause of uveitis.  Continue topical treatment per Ophthalmology as needed  - continue treatment of multiple sclerosis per Neurology.    - Monitor IgG and IgG4 levels, if rising or worsening adenopathy noted, consider switching to rituximab, if okay with Neurology.  No clear indication for treatment of incidentally found IgG4 related disease at this time  - consider starting methotrexate for prevention of recurrent uveitis, if continues to recur  - right subacromial bursa corticosteroid injection performed in clinic today    No orders of the defined types  were placed in this encounter.       Requested Prescriptions      No prescriptions requested or ordered in this encounter       Return in about 3 months (around 04/21/2017).      Procedure note:  Right subacromial bursa corticosteroid injection  Risks and benefits of procedure discussed with patient. Written and verbal, witnessed informed consent was obtained. A time out period was observed to identify the correct patient, side and site.   After palpation exam, area was marked for injection and thoroughly cleaned with chloraprep.   Ethyl chloride used for local anesthesia prior to procedure. Under sterile precautions, 40 mg of Depo-Medrol mixed with 2 cc xylocaine injected into the right subacromial bursa. Bandaid applied. Patient tolerated the procedure well, no post-procedure complications. Advised to avoid soaking in water and avoidance of excessive activity involving the joint for 24 hrs.

## 2017-01-19 NOTE — Patient Instructions (Addendum)
My name is Nancie Neas, [place] and I was the Medical Assistant  that was caring for you today. If you have any questions or concerns regarding today's visit, please feel free to give me a call at [phone]. If you receive a survey in the mail, we appreciate your time in completing the survey.   Thank you for choosing [facility] for all your healthcare needs!! :-)      [facility] Hours       [address]    Monday - Friday 8am-11:30am (closed for lunch) 1:00pm-4:30pm       Map to [place], [place], [place]  [id number] (Xrays on a walk-in basis) 8:00 am - 4:30 pm   Note: For GPS devices and apps, use the address [address].                               Please get the records of your last colonoscopy so we have it on file  Please get your flu shot next time  I would like to see you in 2-3 months for follow up  It was great to see you again today!

## 2017-01-19 NOTE — Interdisciplinary (Signed)
Blood drawn from left arm with 21 gauge needle. 4 tubes taken.   Patient identity authenticated by Bettye Boeck.

## 2017-01-19 NOTE — Interdisciplinary (Signed)
Time Out: performed at 8:56 AM.    Correct patient:  yes  Correct procedure:  yes  Correct side/site:  yes  Correct position:  yes  Correct equipment and/or implants:  yes     MD present during timeout

## 2017-01-20 LAB — CHLAMYDIA/GONORRHEA PCR, GENITAL
Chlamydia trachomatis PCR: NOT DETECTED
Neisseria gonorrhoeae PCR: NOT DETECTED

## 2017-01-20 LAB — SYPHILIS EIA SCREEN, BLOOD: Syphilis EIA Screen: NEGATIVE

## 2017-01-21 ENCOUNTER — Ambulatory Visit (HOSPITAL_BASED_OUTPATIENT_CLINIC_OR_DEPARTMENT_OTHER): Payer: Medicare Other

## 2017-01-24 ENCOUNTER — Encounter (INDEPENDENT_AMBULATORY_CARE_PROVIDER_SITE_OTHER): Payer: Self-pay | Admitting: Family Practice

## 2017-01-27 ENCOUNTER — Encounter (INDEPENDENT_AMBULATORY_CARE_PROVIDER_SITE_OTHER): Payer: Self-pay | Admitting: Family Practice

## 2017-01-27 DIAGNOSIS — J441 Chronic obstructive pulmonary disease with (acute) exacerbation: Secondary | ICD-10-CM | POA: Diagnosis not present

## 2017-01-27 DIAGNOSIS — F329 Major depressive disorder, single episode, unspecified: Secondary | ICD-10-CM | POA: Diagnosis not present

## 2017-01-27 DIAGNOSIS — J018 Other acute sinusitis: Secondary | ICD-10-CM | POA: Diagnosis not present

## 2017-01-27 LAB — HPV HIGH RISK DNA PROBE, FEMALE
HPV High Risk Genotype 16: NOT DETECTED
HPV High Risk Genotype 18: NOT DETECTED
HPV Other High Risk Genotypes (Not type 16 or 18): NOT DETECTED

## 2017-01-28 ENCOUNTER — Telehealth (HOSPITAL_BASED_OUTPATIENT_CLINIC_OR_DEPARTMENT_OTHER): Payer: Self-pay | Admitting: Neurology

## 2017-01-28 ENCOUNTER — Encounter (HOSPITAL_BASED_OUTPATIENT_CLINIC_OR_DEPARTMENT_OTHER): Payer: Self-pay | Admitting: Student in an Organized Health Care Education/Training Program

## 2017-01-28 DIAGNOSIS — G35 Multiple sclerosis: Principal | ICD-10-CM

## 2017-01-28 NOTE — Telephone Encounter (Signed)
Fax received from [facility] prescribing program    TYSABRI approved    Valid from 02/26/17 to 08/27/17    AUTH: [id number]

## 2017-01-28 NOTE — Progress Notes (Signed)
Patient due for [place].    Noted that she has not been seen since 05/2016. She continues on Tysabri q 28 days. Most recent JCV 0.26 (negative) 01/03/17    Will request schedulers to bring in for follow up.

## 2017-01-31 ENCOUNTER — Ambulatory Visit (HOSPITAL_BASED_OUTPATIENT_CLINIC_OR_DEPARTMENT_OTHER): Payer: Medicare Other

## 2017-02-01 ENCOUNTER — Telehealth (HOSPITAL_BASED_OUTPATIENT_CLINIC_OR_DEPARTMENT_OTHER): Payer: Self-pay

## 2017-02-01 NOTE — Telephone Encounter (Signed)
Called patient to schedule. No answer left message to call back.

## 2017-02-01 NOTE — Telephone Encounter (Signed)
T/C from pt to schedule infusion. Pt c/b number:  [phone] (M)

## 2017-02-03 NOTE — Telephone Encounter (Signed)
Pt called our scheduling office to reschedule Tysabri office. We were able o schedule pt for Thursday 02/10/17 at 10:30 a.m.

## 2017-02-04 ENCOUNTER — Encounter (INDEPENDENT_AMBULATORY_CARE_PROVIDER_SITE_OTHER): Payer: Self-pay | Admitting: Ophthalmology

## 2017-02-04 ENCOUNTER — Ambulatory Visit (INDEPENDENT_AMBULATORY_CARE_PROVIDER_SITE_OTHER): Payer: Medicare Other | Admitting: Ophthalmology

## 2017-02-04 DIAGNOSIS — H209 Unspecified iridocyclitis: Secondary | ICD-10-CM

## 2017-02-04 DIAGNOSIS — H25041 Posterior subcapsular polar age-related cataract, right eye: Principal | ICD-10-CM

## 2017-02-04 MED ORDER — PREDNISOLONE ACETATE 1 % OP SUSP
1.00 [drp] | Freq: Three times a day (TID) | OPHTHALMIC | Status: DC
Start: ? — End: 2017-06-10

## 2017-02-04 NOTE — Assessment & Plan Note (Signed)
F/b Dr. Chao, cpm

## 2017-02-04 NOTE — Assessment & Plan Note (Signed)
Observe  Wait for 3 months of quiet eye before proceeding  MRx did not improve vision, appears improvement from previous exam given treatment of uveitis  -f/u 2 months, if quiet can consider ceiol od, attempt old records to see if ambylopia is limitation of vision

## 2017-02-04 NOTE — Progress Notes (Signed)
Uveitis of right eye  F/b Dr. Nila Nephew, cpm    Posterior subcapsular age-related cataract, right eye  Observe  Wait for 3 months of quiet eye before proceeding  MRx did not improve vision, appears improvement from previous exam given treatment of uveitis  -f/u 2 months, if quiet can consider ceiol od, attempt old records to see if ambylopia is limitation of vision      I have seen and and personally examined the patient. I agree with the note and plan as I have modified above.

## 2017-02-06 ENCOUNTER — Encounter (INDEPENDENT_AMBULATORY_CARE_PROVIDER_SITE_OTHER): Payer: Self-pay | Admitting: Ophthalmology

## 2017-02-06 DIAGNOSIS — H209 Unspecified iridocyclitis: Principal | ICD-10-CM

## 2017-02-07 ENCOUNTER — Ambulatory Visit (INDEPENDENT_AMBULATORY_CARE_PROVIDER_SITE_OTHER): Payer: Medicare Other | Admitting: Ophthalmology

## 2017-02-10 ENCOUNTER — Ambulatory Visit
Admission: RE | Admit: 2017-02-10 | Discharge: 2017-02-10 | Disposition: A | Discharge status: Home / Self Care | Payer: Medicare Other | Attending: Neurology | Admitting: Neurology

## 2017-02-10 DIAGNOSIS — G35 Multiple sclerosis: Principal | ICD-10-CM | POA: Insufficient documentation

## 2017-02-10 MED ORDER — SODIUM CHLORIDE 0.9 % IV SOLN
INTRAVENOUS | Status: DC
Start: 2017-02-10 — End: 2017-02-14
  Administered 2017-02-10: 1 mL via INTRAVENOUS

## 2017-02-10 MED ORDER — SODIUM CHLORIDE 0.9 % IV SOLN
300.00 mg | Freq: Once | INTRAVENOUS | Status: AC
Start: 2017-02-10 — End: 2017-02-10
  Administered 2017-02-10: 300 mg via INTRAVENOUS
  Filled 2017-02-10: qty 15

## 2017-02-10 NOTE — Interdisciplinary (Signed)
Non-Chemotherapy Infusion Nursing Note - Debra Bolton is a 56 year old female who presents for infusion of TYSABRI . Pt. Arrived ambulatory and in no apparent distress .     Vitals:    02/10/17 1033   BP: 119/74   BP Location: Left arm   BP Patient Position: Sitting   Pulse: 81   Resp: 16   Temp: 98 F (36.7 C)   TempSrc: Oral   SpO2: 98%   Weight: 62.3 kg (137 lb 6.4 oz)   Height: 5\' 4"  (1.626 m)     Pain Score: 0  Body surface area is 1.68 meters squared.  Body mass index is 23.58 kg/(m^2).    Pre-treatment nursing assessment:  No problems identified upon assessment. Infusion check list done with no change from last tx per pt.  . Pt. States she is tolerating well .     [name] tolerated treatment well., good blood return through out checked every 30 min .   Medications   natalizumab (TYSABRI) 300 mg in sodium chloride 0.9 % 100 mL IVPB (0 mg IntraVENOUS Completed 02/10/17 1211)         Post blood return: Brisk  Post-Flush: Discontinued IV    Patient Education  Learner: Patient  Barriers to learning: No Barriers  Readiness to learn: Acceptance  Method: Explanation and Handout , reviewed symptoms to report to MD     Treatment Education: Information/teaching given to patient including: signs and symptoms of infection, bleeding, adverse reaction(s), symptom control, and when to notify MD.    [name]: Instructed patient to call for assistance.    Pain Education: Patient instructed to contact nurse if pain should develop or if their current pain therapy becomes ineffective.    Response: Verbalizes understanding    Discharge Plan  Discharge instructions given to patient.  Future appointments given and reviewed with treatment plan.  Discharge Mode: Ambulatory  Accompanied by: Family  Discharged To: Home ,Pt. Alert and oriented x 3  stable to walk 10 steps back and forth before discharge .

## 2017-02-16 ENCOUNTER — Encounter (HOSPITAL_BASED_OUTPATIENT_CLINIC_OR_DEPARTMENT_OTHER): Payer: Self-pay | Admitting: Student in an Organized Health Care Education/Training Program

## 2017-02-16 ENCOUNTER — Telehealth (HOSPITAL_BASED_OUTPATIENT_CLINIC_OR_DEPARTMENT_OTHER): Payer: Self-pay

## 2017-02-16 NOTE — Telephone Encounter (Signed)
Called patient to schedule TYSABRI. No answer left message to call back.

## 2017-02-16 NOTE — Progress Notes (Signed)
Notified by infusion schedulers that Debra Bolton needs more orders for Tysabri.    She continues on Tysabri q 28 days. Most recent JCV 0.26 (negative) 01/03/17    Will request schedulers to bring in for follow up.

## 2017-02-17 DIAGNOSIS — G894 Chronic pain syndrome: Secondary | ICD-10-CM | POA: Diagnosis not present

## 2017-02-17 DIAGNOSIS — Z8781 Personal history of (healed) traumatic fracture: Secondary | ICD-10-CM | POA: Diagnosis not present

## 2017-02-17 DIAGNOSIS — M5136 Other intervertebral disc degeneration, lumbar region: Secondary | ICD-10-CM | POA: Diagnosis not present

## 2017-02-17 DIAGNOSIS — Z720 Tobacco use: Secondary | ICD-10-CM | POA: Diagnosis not present

## 2017-02-17 DIAGNOSIS — Z79891 Long term (current) use of opiate analgesic: Secondary | ICD-10-CM | POA: Diagnosis not present

## 2017-02-17 DIAGNOSIS — M542 Cervicalgia: Secondary | ICD-10-CM | POA: Diagnosis not present

## 2017-02-17 DIAGNOSIS — M546 Pain in thoracic spine: Secondary | ICD-10-CM | POA: Diagnosis not present

## 2017-02-17 DIAGNOSIS — G5600 Carpal tunnel syndrome, unspecified upper limb: Secondary | ICD-10-CM | POA: Diagnosis not present

## 2017-02-17 DIAGNOSIS — F1120 Opioid dependence, uncomplicated: Secondary | ICD-10-CM | POA: Diagnosis not present

## 2017-02-24 DIAGNOSIS — J018 Other acute sinusitis: Secondary | ICD-10-CM | POA: Diagnosis not present

## 2017-02-24 DIAGNOSIS — Z23 Encounter for immunization: Secondary | ICD-10-CM | POA: Diagnosis not present

## 2017-02-24 DIAGNOSIS — F329 Major depressive disorder, single episode, unspecified: Secondary | ICD-10-CM | POA: Diagnosis not present

## 2017-02-24 DIAGNOSIS — J449 Chronic obstructive pulmonary disease, unspecified: Secondary | ICD-10-CM | POA: Diagnosis not present

## 2017-03-01 NOTE — Telephone Encounter (Signed)
Pt returned our call to schedule Tysabri at our [place] office. We were able to schedule an apt for 03/16/17.

## 2017-03-16 ENCOUNTER — Ambulatory Visit
Admission: RE | Admit: 2017-03-16 | Discharge: 2017-03-16 | Disposition: A | Discharge status: Home / Self Care | Payer: Medicare Other | Attending: Neurology | Admitting: Neurology

## 2017-03-16 DIAGNOSIS — Z5112 Encounter for antineoplastic immunotherapy: Secondary | ICD-10-CM | POA: Insufficient documentation

## 2017-03-16 DIAGNOSIS — G35 Multiple sclerosis: Principal | ICD-10-CM | POA: Insufficient documentation

## 2017-03-16 MED ORDER — SODIUM CHLORIDE 0.9 % IV SOLN
INTRAVENOUS | Status: DC
Start: 2017-03-16 — End: 2017-03-20
  Administered 2017-03-16: 12:00:00 via INTRAVENOUS

## 2017-03-16 MED ORDER — SODIUM CHLORIDE 0.9 % IV SOLN
300.0000 mg | Freq: Once | INTRAVENOUS | Status: AC
Start: 2017-03-16 — End: 2017-03-16
  Administered 2017-03-16: 300 mg via INTRAVENOUS
  Filled 2017-03-16: qty 15

## 2017-03-16 NOTE — Interdisciplinary (Addendum)
Non-Chemotherapy Infusion Nursing Note - [place] is a 56 year old female who presents for infusion of Tysabri.     Vitals:    03/16/17 1126   BP: 128/78   BP Location: Left arm   BP Patient Position: Sitting   Pulse: 79   Resp: 16   Temp: 98.1 F (36.7 C)   TempSrc: Oral   SpO2: 96%   Weight: 63 kg (139 lb)   Height: 5\' 4"  (1.626 m)     Pain Score: 0  Body surface area is 1.69 meters squared.  Body mass index is 23.86 kg/(m^2).    Medications   sodium chloride 0.9% infusion ( IntraVENOUS Completed 03/16/17 1300)   natalizumab (TYSABRI) 300 mg in sodium chloride 0.9 % 100 mL IVPB (0 mg IntraVENOUS Completed 03/16/17 1252)     Pre-treatment nursing assessment:  No problems identified upon assessment.  Patient denied problems following last Tysabri infusion.  Denied new medications or worsening of symptoms.     Debra Bolton tolerated treatment well.    Post blood return: Brisk  Post-Flush: Discontinued IV    Patient Education  Learner: Patient  Barriers to learning: No Barriers  Readiness to learn: Acceptance  Method: Explanation    Treatment Education: Information/teaching given to patient including: signs and symptoms of infection, bleeding, adverse reaction(s), symptom control, and when to notify MD.    Debra Bolton Prevention Education: Instructed patient to call for assistance.    Pain Education: Patient instructed to contact nurse if pain should develop or if their current pain therapy becomes ineffective.    Response: Verbalizes understanding    Discharge Plan  Discharge instructions given to patient.  Future appointments given and reviewed with treatment plan.  Discharge Mode: Ambulatory  Discharge Time: [place] by: Self  Discharged To: Home .  Declined to stay for required observation post-infusion.

## 2017-03-21 DIAGNOSIS — F1120 Opioid dependence, uncomplicated: Secondary | ICD-10-CM | POA: Diagnosis not present

## 2017-03-21 DIAGNOSIS — M542 Cervicalgia: Secondary | ICD-10-CM | POA: Diagnosis not present

## 2017-03-21 DIAGNOSIS — Z8781 Personal history of (healed) traumatic fracture: Secondary | ICD-10-CM | POA: Diagnosis not present

## 2017-03-21 DIAGNOSIS — Z720 Tobacco use: Secondary | ICD-10-CM | POA: Diagnosis not present

## 2017-03-21 DIAGNOSIS — G5600 Carpal tunnel syndrome, unspecified upper limb: Secondary | ICD-10-CM | POA: Diagnosis not present

## 2017-03-21 DIAGNOSIS — G894 Chronic pain syndrome: Secondary | ICD-10-CM | POA: Diagnosis not present

## 2017-03-21 DIAGNOSIS — M546 Pain in thoracic spine: Secondary | ICD-10-CM | POA: Diagnosis not present

## 2017-03-21 DIAGNOSIS — Z79891 Long term (current) use of opiate analgesic: Secondary | ICD-10-CM | POA: Diagnosis not present

## 2017-03-21 DIAGNOSIS — M5136 Other intervertebral disc degeneration, lumbar region: Secondary | ICD-10-CM | POA: Diagnosis not present

## 2017-03-29 ENCOUNTER — Ambulatory Visit (INDEPENDENT_AMBULATORY_CARE_PROVIDER_SITE_OTHER): Payer: Medicare Other | Admitting: Internal Medicine

## 2017-03-29 ENCOUNTER — Ambulatory Visit (INDEPENDENT_AMBULATORY_CARE_PROVIDER_SITE_OTHER): Payer: Medicare Other | Admitting: Psychiatric/Mental Health

## 2017-03-29 ENCOUNTER — Other Ambulatory Visit (INDEPENDENT_AMBULATORY_CARE_PROVIDER_SITE_OTHER): Payer: Medicare Other | Attending: Internal Medicine

## 2017-03-29 ENCOUNTER — Encounter (INDEPENDENT_AMBULATORY_CARE_PROVIDER_SITE_OTHER): Payer: Self-pay | Admitting: Internal Medicine

## 2017-03-29 VITALS — BP 132/78 | HR 77 | Resp 16 | Ht 64.0 in | Wt 138.0 lb

## 2017-03-29 VITALS — BP 109/55 | HR 82 | Temp 98.3°F | Resp 16 | Ht 64.0 in

## 2017-03-29 DIAGNOSIS — D8989 Other specified disorders involving the immune mechanism, not elsewhere classified: Principal | ICD-10-CM

## 2017-03-29 DIAGNOSIS — F4310 Post-traumatic stress disorder, unspecified: Secondary | ICD-10-CM

## 2017-03-29 DIAGNOSIS — G894 Chronic pain syndrome: Secondary | ICD-10-CM

## 2017-03-29 DIAGNOSIS — G35 Multiple sclerosis: Secondary | ICD-10-CM

## 2017-03-29 DIAGNOSIS — H209 Unspecified iridocyclitis: Secondary | ICD-10-CM

## 2017-03-29 DIAGNOSIS — M797 Fibromyalgia: Secondary | ICD-10-CM

## 2017-03-29 DIAGNOSIS — F339 Major depressive disorder, recurrent, unspecified: Secondary | ICD-10-CM

## 2017-03-29 DIAGNOSIS — R252 Cramp and spasm: Secondary | ICD-10-CM

## 2017-03-29 DIAGNOSIS — F39 Unspecified mood [affective] disorder: Principal | ICD-10-CM

## 2017-03-29 LAB — URINALYSIS WITH CULTURE REFLEX, WHEN INDICATED
Bilirubin: NEGATIVE
Blood: NEGATIVE
Glucose: NEGATIVE
Ketones: NEGATIVE
Leuk Esterase: NEGATIVE
Nitrite: NEGATIVE
Protein: NEGATIVE
Specific Gravity: 1.004 (ref 1.002–1.030)
Urobilinogen: NEGATIVE
pH: 6 (ref 5.0–8.0)

## 2017-03-29 LAB — CBC WITH DIFF, BLOOD
ANC-Automated: 3.2 10*3/uL (ref 1.6–7.0)
Abs Basophils: 0 10*3/uL (ref <0.1)
Abs Eosinophils: 0.4 10*3/uL (ref 0.1–0.5)
Abs Lymphs: 5.7 10*3/uL — ABNORMAL HIGH (ref 0.8–3.1)
Abs Monos: 0.7 10*3/uL (ref 0.2–0.8)
Basophils: 0 %
Eosinophils: 4 %
Hct: 37.9 % (ref 34.0–45.0)
Hgb: 12.8 gm/dL (ref 11.2–15.7)
Lymphocytes: 57 %
MCH: 29.5 pg (ref 26.0–32.0)
MCHC: 33.8 g/dL (ref 32.0–36.0)
MCV: 87.3 um3 (ref 79.0–95.0)
MPV: 9.5 fL (ref 9.4–12.4)
Monocytes: 7 %
Plt Count: 267 10*3/uL (ref 140–370)
RBC: 4.34 10*6/uL (ref 3.90–5.20)
RDW: 13.1 % (ref 12.0–14.0)
Segs: 32 %
WBC: 10.1 10*3/uL — ABNORMAL HIGH (ref 4.0–10.0)

## 2017-03-29 LAB — IGG, BLOOD: IGG: 1044 mg/dL (ref 700–1600)

## 2017-03-29 LAB — COMPREHENSIVE METABOLIC PANEL, BLOOD
ALT (SGPT): 18 U/L (ref 0–33)
AST (SGOT): 28 U/L (ref 0–32)
Albumin: 4.5 g/dL (ref 3.5–5.2)
Alkaline Phos: 58 U/L (ref 35–140)
Anion Gap: 15 mmol/L (ref 7–15)
BUN: 13 mg/dL (ref 6–20)
Bicarbonate: 24 mmol/L (ref 22–29)
Bilirubin, Tot: 0.37 mg/dL (ref <1.2)
Calcium: 9.8 mg/dL (ref 8.5–10.6)
Chloride: 102 mmol/L (ref 98–107)
Creatinine: 0.83 mg/dL (ref 0.51–0.95)
GFR: >60 mL/min
Glucose: 82 mg/dL (ref 70–99)
Potassium: 4 mmol/L (ref 3.5–5.1)
Sodium: 141 mmol/L (ref 136–145)
Total Protein: 7.5 g/dL (ref 6.0–8.0)

## 2017-03-29 LAB — IGE, BLOOD: IGE: 84 [IU]/mL (ref 0–99)

## 2017-03-29 LAB — RBC MORPHOLOGY
Plt Est: ADEQUATE
RBC Comment: NORMAL

## 2017-03-29 LAB — SED RATE, BLOOD: Sed Rate: 8 mm/hr (ref 0–30)

## 2017-03-29 LAB — C-REACTIVE PROTEIN, BLOOD: CRP: <0.03 mg/dL (ref <0.5)

## 2017-03-29 MED ORDER — DULOXETINE HCL 60 MG OR CPEP
120.0000 mg | ORAL_CAPSULE | Freq: Every day | ORAL | 5 refills | Status: DC
Start: 2017-03-29 — End: 2017-12-29

## 2017-03-29 MED ORDER — NORTRIPTYLINE HCL 25 MG OR CAPS
50.0000 mg | ORAL_CAPSULE | Freq: Every evening | ORAL | 5 refills | Status: DC
Start: 2017-03-29 — End: 2018-05-02

## 2017-03-29 MED ORDER — TIZANIDINE HCL 4 MG OR TABS
6.0000 mg | ORAL_TABLET | Freq: Every evening | ORAL | 2 refills | Status: DC
Start: 2017-03-29 — End: 2017-07-25
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
## 2017-03-29 NOTE — Progress Notes (Signed)
Referring Provider:  Edi, Verl Blalock, MD  [address]  [place], [place] [address]    Chief Complaint  Chief Complaint   Patient presents with    Follow Up     MS   1. Multiple Sclerosis, relapsing form  2. Anterior uveitis  3. IgG related lymphadenopathy    History of present Illness  56 year old female with history of multiple sclerosis diagnosed 1991, presented with numbness in both legs, seizures of 4 years duration.  Multiple relapses since then.  Previously treated with Copaxone.  Started Natalizumab in 2014 with good response and improvement in pain and numbness in the extremities.  Currently taking Cymbalta and gabapentin which appear to help with pain in her extremities.  Chronic right shoulder pain secondary to previous injury, no previous bursa injections.  Pain worse with use and lifting heavy with the right arm.  No other joints with persistent pain swelling or morning stiffness at this time.  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  Seen by Ophthalmology at [facility], considering workup for systemic autoimmune disease.  Previous labs from [place] from April 2017 revealed normal SSA, SSB, QuantiFERON TB, cell counts, CMP, inflammatory markers.    She is a chronic heavy smoker, about a year ago she had change in voice since cervical lymphadenopathy.  Underwent removal of enlarged tonsil and cervical lymph node to rule out malignancy.  Biopsy was negative for malignancy, however suggested IgG4 related disease with 50 cells per high-power field in lymph node with IgG4 staining.  Workup for systemic disease was done with CT chest abdomen pelvis 08/11/2015 which was negative for lymphadenopathy and for retroperitoneal fibrosis.  No pancreatitis.    Underwent right subacromial bursa injection for bursitis at last visit, excellent response.  Now with mild recurrence of symptoms, tolerable.  Does yoga regularly which appears to help. No nighttime  pain or morning stiffness.    Otherwise feels well.  Mild cervical lymph node enlargement over the last few months.  Denies upper respiratory tract infections.  Does not appear to affect her swallowing.  No unexpected appetite or weight changes.  No persistent fever.    Skin: No photosensitivity, rash or ulceration. No skin psoriasis  Hair: No unusual hair loss  Oral: No oral ulceration or sore throat   Eyes:  Per HPI  Ears: No changes in hearing, ear pain or discharge  Abd: No discomfort. No change in bowel habits  Resp: No difficulty breathing, chest pain or chronic cough  Genitourinary: No difficulty or pain with urination  Systemic: No fever, malaise, changes in weight or appetite. No h/o recurrent infections  Neurological:  Per HPI  Extremities: No h/o Raynaud's phenomenon. No other joints with swelling, pain or redness    Review of Systems  As per HPI    Medications and Allergies were reviewed with the patient.       Current Outpatient Medications:     baclofen (LIORESAL) 10 MG tablet, Take 1 tablet (10 mg) by mouth daily., Disp: , Rfl: 0    diazepam (VALIUM) 5 MG tablet, Take 1 tablet (5 mg) by mouth once a week., Disp: , Rfl:     DULoxetine (CYMBALTA) 60 MG CR capsule, Take 2 capsules (120 mg) by mouth daily., Disp: 60 capsule, Rfl: 5    gabapentin (NEURONTIN) 300 MG capsule, Take 2 capsules (600 mg) by mouth nightly., Disp: , Rfl:     nortriptyline (  PAMELOR) 25 MG capsule, Take 2 capsules (50 mg) by mouth nightly., Disp: 60 capsule, Rfl: 5    prednisoLONE acetate (PRED FORTE) 1 % ophthalmic suspension, Place 1 drop into right eye 3 times daily., Disp: , Rfl:     thyroid (ARMOUR THYROID) 60 MG tablet, Take 1 tablet (60 mg) by mouth daily., Disp: 90 tablet, Rfl: 3    tiZANidine (ZANAFLEX) 4 MG tablet, Take 1.5 tablets (6 mg) by mouth at bedtime., Disp: 45 tablet, Rfl: 2    Allergies  Penicillins and Modafinil    Past Medical History:   Diagnosis Date    Asthma 1991    with pregnancy     Hypothyroidism     Major depressive disorder, single episode     MS (multiple sclerosis) (CMS-HCC)        Past Surgical History:   Procedure Laterality Date    LYMPH NODE BIOPSY      IgG4 related disease    TONSILLECTOMY AND ADENOIDECTOMY      only tonsillectomy       Family History   Problem Relation Name Age of Onset    Other Mother Zara Chess     Thyroid Mother Zara Chess     Cancer Father don mc clain         lung    Cancer Brother      Cancer Sister Velva Harman mcclain         breast    Diabetes Sister suzanne mcclain     Stroke Brother Don mcclain        Social History     Socioeconomic History    Marital status: Divorced     Spouse name: Not on file    Number of children: Not on file    Years of education: Not on file    Highest education level: Not on file   Social Needs    Financial resource strain: Not on file    Food insecurity - worry: Not on file    Food insecurity - inability: Not on file    Transportation needs - medical: Not on file    Transportation needs - non-medical: Not on file   Occupational History    Not on file   Tobacco Use    Smoking status: Former Smoker     Packs/day: 1.00     Years: 26.00     Pack years: 26.00     Types: Cigarettes     Last attempt to quit: 02/01/2016     Years since quitting: 1.1    Smokeless tobacco: Never Used   Substance and Sexual Activity    Alcohol use: Yes     Alcohol/week: 10.0 oz     Types: 4 Glasses of wine per week    Drug use: Not on file    Sexual activity: Yes     Partners: Male   Other Topics Concern    Military Service Not Asked    Blood Transfusions Not Asked    Caffeine Concern Not Asked    Occupational Exposure Not Asked    Hobby Hazards Not Asked    Sleep Concern Not Asked    Stress Concern Not Asked    Weight Concern Not Asked    Special Diet Not Asked    Back Care Not Asked    Exercise Not Asked    Bike Helmet Not Asked    Seat Belt Not Asked    Self-Exams Not Asked   Social History Narrative  [place] from  [place], she is very conscientious of effect of diet on health.    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    Hasn't had a cigarette in 8 months.    Has smoked 26 years, less than a pack a day.    Currently drinks 2-4 drinks a day on the weekends, total 4-8 per week.    Sexually active with one female partner.    Currently not working, has been on disability for most of the time from 1992 to now.         Physical Exam  Vitals:    03/29/17 1406   BP: 109/55   BP Location: Left arm   BP Patient Position: Sitting   BP cuff size: Regular   Pulse: 82   Resp: 16   Temp: 98.3 F (36.8 C)   TempSrc: Oral   Height: 5' 4"  (1.626 m)     Gen: Alert, cooperative  Psych: Appropriate affect  Neck:  Prominent submandibular lymph nodes, single nonmatted, freely mobile, nontender, bilateral  Skin: No rheumatologic rashes or ulcers  Eyes: No conjunctival erythema, EOMI. No scleral icterus  ENT: No oral mucositis, no sinus tenderness, no ear discharge  CV: Regular rhythm, normal rate  Resp: Breathing comfortably, no tachypnea  Abd: Soft, non-tender  Joint exam: A complete joint exam revealed no significant pain on resisted abduction of right shoulder consistent with right subacromial bursitis. No active synovitis in upper or lower extremity joints. Good range of motion in shoulder and pelvic girdles  Neuro: Oriented to time, place and person         Most recent labs and imaging were reviewed and discussed with the patient.  50 pages of outside records reviewed, outlined in HPI    08/08/2015  IgG4 level 215 upper limit 147m/dl  Total IgG 1050 upper limit 1590    01/12/17  IgG4 level 193    Impression    ICD-10-CM ICD-9-CM   1. 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 Uveitis of right eye H20.9 364.3   3. Multiple sclerosis (CMS-HCC) G35 340       Shauntae KNissenis a 56year old female with multiple sclerosis, followed by Neurology, on treatment with Natalizumab every 4 weeks.  Well controlled.  Chronic nerve pain  well controlled with Cymbalta and gabapentin.    Noted to have IgG4 related disease on histopathology of enlarged right cervical lymph nodes.  No persistent lymphadenopathy, no pancreatitis, no other systemic lymphadenopathy on CT chest abdomen pelvis performed April 2017.  Plan to continue close monitoring for recurrence of lymphadenopathy and consider switching to rituximab if progressive disease noted, if okay with Neurology from a multiple sclerosis standpoint.    History 2 episodes of anterior uveitis in the last 1 year.  Negative ANA HLA B27, Qft TB    PLAN:  - discuss results of most recent lab tests with the patient.  negative workup for secondary cause of uveitis.  Continue topical treatment per Ophthalmology as needed. Consider starting methotrexate for prevention of recurrent uveitis, if continues to recur  - continue treatment of multiple sclerosis per Neurology    - Monitor IgG and IgG4 levels, if rising or worsening adenopathy noted, consider switching to rituximab, if okay with Neurology.  No clear indication for treatment of incidentally found IgG4 related disease at this time  - neck ultrasound to assess for cervical lymphadenopathy given the palpable lymph nodes    -  repeat right subacromial bursa corticosteroid injection can be considered if right shoulder symptoms get worse    Orders Placed This Encounter   Procedures    US Soft Tissue Head/Neck    CBC w/ Diff Lavender    Comprehensive Metabolic Panel Green Plasma Separator Tube    C-Reactive Protein, Blood Green Plasma Separator Tube    Sedimentation Rate (ESR), Blood Lavender    Urinalysis with Culture Reflex, when indicated    IgG, Blood - See Instructions    IgE, Blood - See Instructions    IgG Subclass, Blood Yellow serum separator tube        Requested Prescriptions      No prescriptions requested or ordered in this encounter       Return in about 3 months (around 06/27/2017).

## 2017-03-29 NOTE — Patient Instructions (Signed)
-  Continue duloxetine 120 mg nightly for depressed mood and neuropathic pain.  -Continue nortriptyline 25 mg nightly for depressed mood and insomnia.   -Continue tizanidine 6 mg nightly for nightmares.   -Schedule with me in 6 months. Okay to call for earlier appointment if depression increases or PTSD symptoms exacerbate.

## 2017-03-30 ENCOUNTER — Encounter (HOSPITAL_BASED_OUTPATIENT_CLINIC_OR_DEPARTMENT_OTHER): Payer: Self-pay | Admitting: Neurology

## 2017-03-31 LAB — IGG SUBCLASS PANEL, BLOOD
IGG Subclass 1: 574 mg/dL (ref 240–1118)
IGG Subclass 2: 420 mg/dL (ref 124–549)
IGG Subclass 3: 14 mg/dL — ABNORMAL LOW (ref 21–134)
IGG Subclass 4: 205 mg/dL — ABNORMAL HIGH (ref 1–123)

## 2017-04-13 ENCOUNTER — Ambulatory Visit (HOSPITAL_BASED_OUTPATIENT_CLINIC_OR_DEPARTMENT_OTHER): Admit: 2017-04-13 | Payer: Medicare Other

## 2017-04-13 ENCOUNTER — Telehealth (HOSPITAL_BASED_OUTPATIENT_CLINIC_OR_DEPARTMENT_OTHER): Payer: Self-pay

## 2017-04-13 NOTE — Telephone Encounter (Signed)
Appointment cancelled

## 2017-04-13 NOTE — Telephone Encounter (Signed)
V/m from pt, left on 12/25.    Pt called to cancel her 12/26 infusion center appointment.  Pt stated that she would c/b to r/s.    Request forwarded to Infusion Schedule Coordinators.  Pt's phone: [phone]

## 2017-04-21 DIAGNOSIS — Z720 Tobacco use: Secondary | ICD-10-CM | POA: Diagnosis not present

## 2017-04-21 DIAGNOSIS — G894 Chronic pain syndrome: Secondary | ICD-10-CM | POA: Diagnosis not present

## 2017-04-21 DIAGNOSIS — G5600 Carpal tunnel syndrome, unspecified upper limb: Secondary | ICD-10-CM | POA: Diagnosis not present

## 2017-04-21 DIAGNOSIS — M5136 Other intervertebral disc degeneration, lumbar region: Secondary | ICD-10-CM | POA: Diagnosis not present

## 2017-04-21 DIAGNOSIS — F1120 Opioid dependence, uncomplicated: Secondary | ICD-10-CM | POA: Diagnosis not present

## 2017-04-21 DIAGNOSIS — M5090 Cervical disc disorder, unspecified, unspecified cervical region: Secondary | ICD-10-CM | POA: Diagnosis not present

## 2017-04-21 DIAGNOSIS — M546 Pain in thoracic spine: Secondary | ICD-10-CM | POA: Diagnosis not present

## 2017-04-21 DIAGNOSIS — Z8781 Personal history of (healed) traumatic fracture: Secondary | ICD-10-CM | POA: Diagnosis not present

## 2017-04-21 DIAGNOSIS — Z79891 Long term (current) use of opiate analgesic: Secondary | ICD-10-CM | POA: Diagnosis not present

## 2017-04-22 ENCOUNTER — Encounter (HOSPITAL_BASED_OUTPATIENT_CLINIC_OR_DEPARTMENT_OTHER): Payer: Self-pay | Admitting: Student in an Organized Health Care Education/Training Program

## 2017-04-22 ENCOUNTER — Ambulatory Visit (INDEPENDENT_AMBULATORY_CARE_PROVIDER_SITE_OTHER): Payer: Self-pay | Admitting: Ophthalmology

## 2017-04-22 NOTE — Progress Notes (Signed)
Debra Bolton was travelling over the holidays and thus 2 weeks late for next Tysabri infusion. Dates updated.

## 2017-04-25 ENCOUNTER — Telehealth (HOSPITAL_BASED_OUTPATIENT_CLINIC_OR_DEPARTMENT_OTHER): Payer: Self-pay | Admitting: Neurology

## 2017-04-25 NOTE — Telephone Encounter (Signed)
Pt called our [place] office to r/s upcoming apt for Tysabri on 04/26/17. Pt stated that she had a cold and was not feeling well enough to come in. We were able to r/s apt to Friday 04/29/17.

## 2017-04-26 ENCOUNTER — Ambulatory Visit (HOSPITAL_BASED_OUTPATIENT_CLINIC_OR_DEPARTMENT_OTHER): Admit: 2017-04-26 | Payer: Medicare Other

## 2017-04-29 ENCOUNTER — Ambulatory Visit
Admission: RE | Admit: 2017-04-29 | Discharge: 2017-04-29 | Disposition: A | Discharge status: Home / Self Care | Payer: Medicare Other | Attending: Neurology | Admitting: Neurology

## 2017-04-29 VITALS — BP 124/75 | HR 88 | Temp 99.0°F | Resp 16 | Ht 64.0 in | Wt 140.5 lb

## 2017-04-29 DIAGNOSIS — G35 Multiple sclerosis: Secondary | ICD-10-CM | POA: Insufficient documentation

## 2017-04-29 MED ORDER — SODIUM CHLORIDE 0.9 % IV SOLN
INTRAVENOUS | Status: DC
Start: 2017-04-29 — End: 2017-05-03
  Administered 2017-04-29: 11:00:00 via INTRAVENOUS

## 2017-04-29 MED ORDER — SODIUM CHLORIDE 0.9 % IV SOLN
300.0000 mg | Freq: Once | INTRAVENOUS | Status: AC
Start: 2017-04-29 — End: 2017-04-29
  Administered 2017-04-29: 300 mg via INTRAVENOUS
  Filled 2017-04-29: qty 15

## 2017-04-29 NOTE — Interdisciplinary (Signed)
Non-Chemotherapy Infusion Nursing Note -  [place]    Debra Bolton is a 57 year old female who presents for infusion of Tysabri    Vitals:    04/29/17 1018   BP: 124/75   BP Location: Left arm   BP Patient Position: Sitting   Pulse: 88   Resp: 16   Temp: 99 F (37.2 C)   TempSrc: Oral   SpO2: 96%   Weight: 63.7 kg (140 lb 8 oz)   Height: 5\' 4"  (1.626 m)     Pain Score: 0  Body surface area is 1.7 meters squared.  Body mass index is 24.12 kg/m.    Pre-treatment nursing assessment:  No problems identified upon assessment.  Afebrile, ambulatory, no distress.  TouchProgram questionnaire completed on line. Patient without worsening vision, mobility, and no new immunosuppressant  Medications.    PIV placed on left arm, brisk blood return.  Medications   sodium chloride 0.9% infusion ( IntraVENOUS Completed 04/29/17 1200)   natalizumab (TYSABRI) 300 mg in sodium chloride 0.9 % 100 mL IVPB (0 mg IntraVENOUS Completed 04/29/17 1148)     Debra Bolton tolerated treatment well.    Post blood return: Brisk  Post-Flush: NS and Discontinued IV    Patient Education  Learner: Patient  Barriers to learning: No Barriers  Readiness to learn: Acceptance  Method: Explanation    Treatment Education: Information/teaching given to patient including: signs and symptoms of infection, bleeding, adverse reaction(s), symptom control, and when to notify MD.    [name]: Instructed patient to call for assistance.    Pain Education: Patient instructed to contact nurse if pain should develop or if their current pain therapy becomes ineffective.    Response: Verbalizes understanding    Discharge Plan  Discharge instructions given to patient.  Future appointments given and reviewed with treatment plan.  Discharge Mode: Ambulatory  Discharge Time: 1210  Accompanied by: Self  Discharged To: Home

## 2017-05-05 ENCOUNTER — Telehealth (INDEPENDENT_AMBULATORY_CARE_PROVIDER_SITE_OTHER): Payer: Self-pay | Admitting: Psychiatry

## 2017-05-05 NOTE — Telephone Encounter (Signed)
MAILED OUT THE FOLLOWING LETTER TO PATIENT:    January 31, 2017           Dear Patient,     I am sending this letter to you in case I have not had the chance to give you this news directly.  I will be retiring from [facility] as of March 09, 2017. It has been my honor and privilege to care for you, and I thank you sincerely for trusting me with your care.  I wish you the very best of health in the years ahead.      If you wish to continue your care at [facility], [place], the clinic can offer some options for transferring to a new provider.     If you would like to be seen at our [place] location, this would be the time to request a transfer.      For information about follow-up appointments, please contact the Psychiatry office at [phone].     Wishing you the best.     Sincerely,           Alexander Papp, MD  [facility]

## 2017-05-11 ENCOUNTER — Ambulatory Visit (HOSPITAL_BASED_OUTPATIENT_CLINIC_OR_DEPARTMENT_OTHER): Payer: Medicare Other

## 2017-05-12 ENCOUNTER — Telehealth (HOSPITAL_BASED_OUTPATIENT_CLINIC_OR_DEPARTMENT_OTHER): Payer: Self-pay | Admitting: Neurology

## 2017-05-12 NOTE — Telephone Encounter (Signed)
Pt called our [place] office to r/s upcoming Tysabri on 05/27/17. We were able to r/s apt to 05/25/17 at 11 a.m.

## 2017-05-20 DIAGNOSIS — M546 Pain in thoracic spine: Secondary | ICD-10-CM | POA: Diagnosis not present

## 2017-05-20 DIAGNOSIS — Z720 Tobacco use: Secondary | ICD-10-CM | POA: Diagnosis not present

## 2017-05-20 DIAGNOSIS — Z8781 Personal history of (healed) traumatic fracture: Secondary | ICD-10-CM | POA: Diagnosis not present

## 2017-05-20 DIAGNOSIS — M5090 Cervical disc disorder, unspecified, unspecified cervical region: Secondary | ICD-10-CM | POA: Diagnosis not present

## 2017-05-20 DIAGNOSIS — F1120 Opioid dependence, uncomplicated: Secondary | ICD-10-CM | POA: Diagnosis not present

## 2017-05-20 DIAGNOSIS — G894 Chronic pain syndrome: Secondary | ICD-10-CM | POA: Diagnosis not present

## 2017-05-20 DIAGNOSIS — M5136 Other intervertebral disc degeneration, lumbar region: Secondary | ICD-10-CM | POA: Diagnosis not present

## 2017-05-20 DIAGNOSIS — G5600 Carpal tunnel syndrome, unspecified upper limb: Secondary | ICD-10-CM | POA: Diagnosis not present

## 2017-05-20 DIAGNOSIS — Z79891 Long term (current) use of opiate analgesic: Secondary | ICD-10-CM | POA: Diagnosis not present

## 2017-05-23 ENCOUNTER — Ambulatory Visit (HOSPITAL_BASED_OUTPATIENT_CLINIC_OR_DEPARTMENT_OTHER): Payer: Medicare Other

## 2017-05-24 ENCOUNTER — Ambulatory Visit (HOSPITAL_BASED_OUTPATIENT_CLINIC_OR_DEPARTMENT_OTHER): Payer: Medicare Other

## 2017-05-25 ENCOUNTER — Ambulatory Visit
Admission: RE | Admit: 2017-05-25 | Discharge: 2017-05-25 | Disposition: A | Discharge status: Home / Self Care | Payer: Medicare Other | Attending: Body Imaging | Admitting: Body Imaging

## 2017-05-25 ENCOUNTER — Ambulatory Visit
Admission: RE | Admit: 2017-05-25 | Discharge: 2017-05-25 | Disposition: A | Discharge status: Home / Self Care | Payer: Medicare Other | Attending: Neurology | Admitting: Neurology

## 2017-05-25 VITALS — BP 138/84 | HR 74 | Temp 98.2°F | Resp 16 | Ht 64.0 in | Wt 141.2 lb

## 2017-05-25 DIAGNOSIS — G35 Multiple sclerosis: Principal | ICD-10-CM | POA: Insufficient documentation

## 2017-05-25 DIAGNOSIS — Z5112 Encounter for antineoplastic immunotherapy: Secondary | ICD-10-CM | POA: Insufficient documentation

## 2017-05-25 DIAGNOSIS — D8989 Other specified disorders involving the immune mechanism, not elsewhere classified: Secondary | ICD-10-CM

## 2017-05-25 DIAGNOSIS — E041 Nontoxic single thyroid nodule: Secondary | ICD-10-CM | POA: Insufficient documentation

## 2017-05-25 DIAGNOSIS — R9389 Abnormal findings on diagnostic imaging of other specified body structures: Principal | ICD-10-CM | POA: Insufficient documentation

## 2017-05-25 MED ORDER — SODIUM CHLORIDE 0.9 % IV SOLN
300.00 mg | Freq: Once | INTRAVENOUS | Status: AC
Start: 2017-05-25 — End: 2017-05-25
  Administered 2017-05-25: 300 mg via INTRAVENOUS
  Filled 2017-05-25: qty 15

## 2017-05-25 MED ORDER — SODIUM CHLORIDE 0.9 % IV SOLN
INTRAVENOUS | Status: DC
Start: 2017-05-25 — End: 2017-05-29
  Administered 2017-05-25: 11:00:00 via INTRAVENOUS

## 2017-05-25 NOTE — Interdisciplinary (Signed)
Non-Chemotherapy Infusion Nursing Note -   [place] is a 57 year old female who presents for infusion of Tysabri.    Vitals:    05/25/17 1106   BP: 138/84   BP Location: Left arm   BP Patient Position: Sitting   Pulse: 74   Resp: 16   Temp: 98.2 F (36.8 C)   TempSrc: Oral   SpO2: 99%   Weight: 64 kg (141 lb 3.2 oz)   Height: 5\' 4"  (1.626 m)     Pain Score: 0  Body surface area is 1.7 meters squared.  Body mass index is 24.24 kg/m.    Pre-treatment nursing assessment:  Patient arrives ambulatory with steady gait and in no apparent distress.  She feels well, without reported acute changes or worsening symptoms.  Touch Program questionnaire completed on line.  PIV placed on left arm, with verified blood return.  Medications   sodium chloride 0.9% infusion ( IntraVENOUS Completed 05/25/17 1240)   natalizumab (TYSABRI) 300 mg in sodium chloride 0.9 % 100 mL IVPB (0 mg IntraVENOUS Completed 05/25/17 1229)       [name] tolerated treatment well.    Post blood return: Brisk  Post-Flush: NS and Discontinued IV    Patient Education  Learner: Patient  Barriers to learning: No Barriers  Readiness to learn: Acceptance  Method: Explanation    Treatment Education: Information/teaching given to patient including: signs and symptoms of infection, bleeding, adverse reaction(s), symptom control, and when to notify MD.    [name]: Instructed patient to call for assistance.    Pain Education: Patient instructed to contact nurse if pain should develop or if their current pain therapy becomes ineffective.    Response: Verbalizes understanding    Discharge Plan  Discharge instructions given to patient.  Future appointments given and reviewed with treatment plan. Patient seeing Dr. Rosendo Gros on 3/1 and tentative return appt on 3/6 for Tysabri  Discharge Mode: Ambulatory  Discharge Time: 1245  Accompanied by: Self  Discharged To: Home

## 2017-05-27 ENCOUNTER — Ambulatory Visit (HOSPITAL_BASED_OUTPATIENT_CLINIC_OR_DEPARTMENT_OTHER): Payer: Medicare Other

## 2017-05-30 DIAGNOSIS — R739 Hyperglycemia, unspecified: Secondary | ICD-10-CM | POA: Diagnosis not present

## 2017-05-30 DIAGNOSIS — I10 Essential (primary) hypertension: Secondary | ICD-10-CM | POA: Diagnosis not present

## 2017-05-30 DIAGNOSIS — Z951 Presence of aortocoronary bypass graft: Secondary | ICD-10-CM | POA: Diagnosis not present

## 2017-05-30 DIAGNOSIS — R1032 Left lower quadrant pain: Secondary | ICD-10-CM | POA: Diagnosis not present

## 2017-05-30 DIAGNOSIS — R109 Unspecified abdominal pain: Secondary | ICD-10-CM | POA: Diagnosis not present

## 2017-05-30 DIAGNOSIS — J449 Chronic obstructive pulmonary disease, unspecified: Secondary | ICD-10-CM | POA: Diagnosis not present

## 2017-05-30 DIAGNOSIS — I252 Old myocardial infarction: Secondary | ICD-10-CM | POA: Diagnosis not present

## 2017-06-01 DIAGNOSIS — R739 Hyperglycemia, unspecified: Secondary | ICD-10-CM | POA: Diagnosis not present

## 2017-06-01 DIAGNOSIS — I2510 Atherosclerotic heart disease of native coronary artery without angina pectoris: Secondary | ICD-10-CM | POA: Diagnosis not present

## 2017-06-01 DIAGNOSIS — I10 Essential (primary) hypertension: Secondary | ICD-10-CM | POA: Diagnosis not present

## 2017-06-01 DIAGNOSIS — F329 Major depressive disorder, single episode, unspecified: Secondary | ICD-10-CM | POA: Diagnosis not present

## 2017-06-01 DIAGNOSIS — J449 Chronic obstructive pulmonary disease, unspecified: Secondary | ICD-10-CM | POA: Diagnosis not present

## 2017-06-01 DIAGNOSIS — G8929 Other chronic pain: Secondary | ICD-10-CM | POA: Diagnosis not present

## 2017-06-01 DIAGNOSIS — E782 Mixed hyperlipidemia: Secondary | ICD-10-CM | POA: Diagnosis not present

## 2017-06-06 DIAGNOSIS — I2510 Atherosclerotic heart disease of native coronary artery without angina pectoris: Secondary | ICD-10-CM | POA: Diagnosis not present

## 2017-06-06 DIAGNOSIS — E785 Hyperlipidemia, unspecified: Secondary | ICD-10-CM | POA: Diagnosis not present

## 2017-06-06 DIAGNOSIS — Z951 Presence of aortocoronary bypass graft: Secondary | ICD-10-CM | POA: Diagnosis not present

## 2017-06-06 DIAGNOSIS — I10 Essential (primary) hypertension: Secondary | ICD-10-CM | POA: Diagnosis not present

## 2017-06-07 ENCOUNTER — Telehealth (HOSPITAL_BASED_OUTPATIENT_CLINIC_OR_DEPARTMENT_OTHER): Payer: Self-pay

## 2017-06-07 NOTE — Telephone Encounter (Signed)
Patient calls to schedule infusion center appointment for 3/4.  Inquiry forwarded to Infusion scheduler.  Patient advised call will be returned within 24 hours.  Patient can be reached at phone [phone].

## 2017-06-07 NOTE — Telephone Encounter (Signed)
Called patient to let her know no more orders in tx plan. No answer left message to call back.

## 2017-06-08 NOTE — Telephone Encounter (Signed)
Debra Bolton continues on Tysabri q 28 days. Most recent JCV 0.26 (negative) 01/03/17    Last Neurology office visit 05/2016 and next appointment 06/17/17    She is actively followed by Rheumatology for uveitis (also followed by optho), IgG4 related disease     Additional orders entered and signed. JCV added to next treatment plan.     Please contact [name] to schedule infusion.

## 2017-06-08 NOTE — Telephone Encounter (Signed)
LM for pt regarding scheduling her next infusion.

## 2017-06-08 NOTE — Telephone Encounter (Signed)
Appointment made for 3/4 per patient's request.

## 2017-06-08 NOTE — Addendum Note (Signed)
Addended by: Bosie Clos on: 06/08/2017 07:57 AM     Modules accepted: Orders

## 2017-06-10 ENCOUNTER — Ambulatory Visit (INDEPENDENT_AMBULATORY_CARE_PROVIDER_SITE_OTHER): Payer: Medicare Other | Admitting: Ophthalmology

## 2017-06-10 DIAGNOSIS — H209 Unspecified iridocyclitis: Secondary | ICD-10-CM

## 2017-06-10 DIAGNOSIS — H25041 Posterior subcapsular polar age-related cataract, right eye: Secondary | ICD-10-CM

## 2017-06-10 MED ORDER — PREDNISOLONE ACETATE 1 % OP SUSP
1.0000 [drp] | Freq: Four times a day (QID) | OPHTHALMIC | 1 refills | Status: DC
Start: 2017-06-10 — End: 2017-10-14

## 2017-06-10 MED ORDER — CYCLOPENTOLATE HCL 1 % OP SOLN
1.0000 [drp] | Freq: Three times a day (TID) | OPHTHALMIC | 1 refills | Status: DC
Start: 2017-06-10 — End: 2017-10-14

## 2017-06-10 NOTE — Assessment & Plan Note (Signed)
F/u 3 months to see if inflammation controlled.

## 2017-06-10 NOTE — Assessment & Plan Note (Addendum)
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 Nila Nephew 2-3 weeks, we can follow for resolution but defer to Dr. Nila Nephew given previous w/u and management

## 2017-06-10 NOTE — Progress Notes (Signed)
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 Nila Nephew 2-3 weeks, we can follow for resolution but defer to Dr. Nila Nephew given previous w/u and management      Posterior subcapsular age-related cataract, right eye  F/u 3 months to see if inflammation controlled.      I have seen and and personally examined the patient. I agree with the note and plan as I have modified above.

## 2017-06-17 ENCOUNTER — Telehealth (HOSPITAL_BASED_OUTPATIENT_CLINIC_OR_DEPARTMENT_OTHER): Payer: Self-pay

## 2017-06-17 ENCOUNTER — Other Ambulatory Visit: Payer: Self-pay

## 2017-06-17 ENCOUNTER — Ambulatory Visit: Payer: Medicare Other | Attending: Neurology | Admitting: Neurology

## 2017-06-17 ENCOUNTER — Other Ambulatory Visit (HOSPITAL_BASED_OUTPATIENT_CLINIC_OR_DEPARTMENT_OTHER): Payer: Medicare Other

## 2017-06-17 VITALS — BP 126/79 | HR 82 | Temp 98.3°F | Resp 18 | Ht 64.0 in | Wt 141.0 lb

## 2017-06-17 DIAGNOSIS — G35 Multiple sclerosis: Principal | ICD-10-CM | POA: Insufficient documentation

## 2017-06-17 DIAGNOSIS — D72810 Lymphocytopenia: Secondary | ICD-10-CM | POA: Insufficient documentation

## 2017-06-17 DIAGNOSIS — D8989 Other specified disorders involving the immune mechanism, not elsewhere classified: Secondary | ICD-10-CM | POA: Insufficient documentation

## 2017-06-17 LAB — HEPATITIS B CORE AB TOTAL: HBcAb Total: NONREACTIVE

## 2017-06-17 NOTE — Telephone Encounter (Signed)
Pt called to r/s apt for Tysabri on 06/20/17. We were able to r/s apt to Tuesday 06/28/17.

## 2017-06-17 NOTE — Patient Instructions (Addendum)
1. MRI brain w/w/o gad  2. Pre rituximab labs  3. Rituximab 500 mg X 2 infusions once cleared  4. Stop tysabri after rituximab approved  5. Take amantadine 100 mg in the morning and afternoon and the provigil just 100 mg in morning; okay to increase ot 200 mg as needed  6. Follow-up 4 months

## 2017-06-17 NOTE — Telephone Encounter (Signed)
Patient phones requesting to reschedule 3/4 appointment. Please call when available to further assist.

## 2017-06-17 NOTE — Telephone Encounter (Signed)
Called patient to reschedule. No answer left message to call back.

## 2017-06-17 NOTE — Progress Notes (Signed)
[facility] Pharmacy Note:    Date:  06/17/17   S: Debra Bolton is a 57 year old female  for follow up MS.     She remains on Tysabri q 28 days      Medication reconciliation: Medication reconciliation was not performed  Vitamin D 5,000 units daily: not listed  Narcotics and who prescribes: diazepam per [place]      Insurance coverage: Medicare  Specialty pharmacy: n/a    Orders/prescriptions up to date/adequate refills: baclofen and diazepam?    Drug specific safety monitoring:  JCV Ab 0.26 (negative) 01/03/17  JCV Ab 0.22 (negative) (10/01/16)        Vaccinations:  Current Immunizations  Never Reviewed    Name Date    Tdap 01/19/2017          Assessment/Plan:  1. Neurology: per Dr. Rosendo Gros, will hold Tysabri for now in favor of rituximab 500mg  x 2  2. Recommended labs: rest of baseline rituximab labs

## 2017-06-17 NOTE — Progress Notes (Signed)
[facility]    First visit: 12/09/2015     Last visit: 03/16/16  Current visit: 06/17/17    Consultation Source: seen at [facility], Lavon Paganini and Christy Gentles, [email or web address] [phone]    Principle Neurological Diagnosis:   1. Clinically definite Multiple Sclerosis, relapsing form  2. Anterior uveitis  3. IgG4 related lymphadenopathy    Narrative History for Current Visit: Debra Bolton is a 57 year old woman living in [place] for the past 2 years, but originally from [place], seen for 27 years by Dr. Lavon Paganini at [facility] [place]. 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  We reviewed the history and problem list in detail today which is documented in the following sections.     She continues to experienced swollen cervical nodes from IgG4 lymphadenopathy syndrome but this has not spread. Mouth ulcers continue. Feels extremely fatigued   Anterior uveitis returned. Unifying diagnosis not clear        _____________________________________________________________________    MS Problem List:   1. Mobility (ND;range 0-6):none  2. Hand Function (ND;range 0-5):right hand clumsiness  3. Vision (ND; range 0-5):blurry peripheral vision   4. Fatigue ( ND; range 0-5):adviced using amantadine 100 mg in the morning and afternoon and the provigil just 100 mg in morning  5. Cognition ( ND; range 0-5):episodes of word finding difficulties within past several years  6. Bowel/Bladder ( ND; range 0-5): rare bowel incontinence starting 10 years ago, infrequent urinary retention   7. Sensory ( ND; range 0-5): bilateral leg tingling and pain (right > left), rarely with arm tingling (right >>left)  8. Spasticity ( ND; range 0-5): bilateral legs, improved with yoga  **9. Pain ( ND; range 0-5): lower extremity tingling and burning pain; currently on 600 mg qhs of gabapetin and cymbalta 60 mg qhs    MS  Performance Scale Score (sum of problem list scores 1-9):  ND  (result ranges from 0 (normal) to 46 (maximal interference of symptoms on life))    10. Affective: (BDI II:  ND) longstanding history of depression  11. Social Support: lives with boyfriend, 3 daughters none present in [place]. Other symptoms or problems:  None  ______________________________________________________________________    MS History      Initial Symptoms (Date and Description): 1987 (~age 60), after delivery of first daughter, both legs would fall asleep during exercise  1988 grand mal seizure in doctor office  1989 another grand mal seizure in [place] while on vacation, was placed on tegretol for 2 years  1991 diagnosed with MS as she had more frequent episodes of fatigue, depression and numbness     Second Attack (Date and Description):     Subsequent relapses and course:    Relapses in Past 2 years: see above     Progressive Disease (approx onset and description): see above      Disease Modifying Treatment History: [Documented Needle phobia: No]    1. Betaseron (~ 1 year on therapy)  2. Copaxone ( <1 year) discontinued because it worsened depression  3. Fritzi Mandes (~3710 - Sep 11, 2015)    ______________________________________________________________________    Review of Systems: Complete 10 point review of symptoms from the patient self report questionnaire was reviewed with the patient today and scanned into the media tab. Any pertinent positives are listed above in the narrative history or problem list.    Allergies  Allergen Reactions    Penicillins Rash    Modafinil Diarrhea and Nausea and Vomiting       Current Outpatient Medications   Medication Sig    baclofen (LIORESAL) 10 MG tablet Take 1 tablet (10 mg) by mouth daily.    cyclopentolate (CYCLODRYL) 1 % ophthalmic solution Place 1 drop into right eye 3 times daily.    diazepam (VALIUM) 5 MG tablet Take 1 tablet (5 mg) by mouth once a week.    DULoxetine (CYMBALTA) 60  MG CR capsule Take 2 capsules (120 mg) by mouth daily.    gabapentin (NEURONTIN) 300 MG capsule Take 2 capsules (600 mg) by mouth nightly.    nortriptyline (PAMELOR) 25 MG capsule Take 2 capsules (50 mg) by mouth nightly.    prednisoLONE acetate (PRED FORTE) 1 % ophthalmic suspension Place 1 drop into right eye 4 times daily.    thyroid (ARMOUR THYROID) 60 MG tablet Take 1 tablet (60 mg) by mouth daily.    tiZANidine (ZANAFLEX) 4 MG tablet Take 1.5 tablets (6 mg) by mouth at bedtime.     No current facility-administered medications for this visit.        Past Medical History:   Diagnosis Date    Asthma 1991    with pregnancy    Hypothyroidism     Major depressive disorder, single episode     MS (multiple sclerosis) (CMS-HCC)      Past Surgical History:   Procedure Laterality Date    LYMPH NODE BIOPSY      IgG4 related disease    TONSILLECTOMY AND ADENOIDECTOMY      only tonsillectomy       Family Medical History:  Family History   Problem Relation Name Age of Onset    Other Mother Zara Chess     Thyroid Mother Zara Chess     Cancer Father don mc clain         lung    Cancer Brother      Cancer Sister Velva Harman mcclain         breast    Diabetes Sister suzanne mcclain     Stroke Brother Don mcclain      ______________________________________________________________________    Examination:   BP 126/79 (BP Location: Left arm, BP Patient Position: Sitting, BP cuff size: Regular)    Pulse 82    Temp 98.3 F (36.8 C) (Oral)    Resp 18    Ht 5' 4"  (1.626 m)    Wt 64 kg (141 lb)    SpO2 98%    BMI 24.20 kg/m   General: Examination of the skin, joints and extremities revealed no abnormalities  ;  There were no cervical, ocular or cranial bruits.     Cognitive/Behavioral: Examination of cognition, language and prosody revealed no abnormalities . Behavioral and affect was appropriate.    Cranial Nerves: Near visual acuity was 20/400 OD and J7 OS without correction.Visual fields were full  to confrontation  testing. Lids were normal .  Pupils were 32m  and reactive with no  RAPD . Fundoscopic exam revealed no definite optic nerve pallor. Eyes were orthotropic with bilateral rotary nystagmus and left end point nystagmus.  Pursuit and saccadic eye movements revealed few beats of lateral nystagmus in both directions. Facial movements revealed no abnormalities. Testing of facial sensation revealed no abnormalities. Hearing was normal  AD and normal  AS. Bulbar examination revealed no abnormalities .     Motor: Upper extremity strength was as  follows (R/L): Shoulder abduction 5/5, Elbow extension 5/5, Wrist extension 5/5, Hand instrinsics 5/5. Rapid alternating movements were graded 1/1+. Spasticity ([place]) was rated 1/1. Lower extremity strength was as follows (R/L): Hip Flexors 5/5, Knee Flexors 5/5, Ankle dorsiflexors 5/5; Toe tapping was rating 1/1; Spasticity was rated 1+/1  There was no atrophy. There were no fasiculations.    Cerebellar: Finger to nose testing revealed no abnormalities  in the RUE and no abnormalities  in the LUE. Heel to shin testing revealed no abnormalities  in the RLE and no abnormalities  in the LLE. (see gait for description of midline/axial cerebellum dysfunction)    Sensory: Sensation to pp and temperature was normal. Vibration sensation duration (secs) was (R/L): Upper extremity middle finger ND/ND; Lower extremity big toe   Joint position sensation was normal .      Reflexes: (R/L): Biceps 2+/2+, BR 2+/2+, Triceps 2+/2+, Patella 2+/2+, Ankle 2/2. There was no clonus .     Gait Description: normal gait and station.  Ambulation was performed safely without  assistance. The patient was able to  tandem walk 8 steps without faltering.   ----------------------------------------------------------------------------------------------------------------------  Performance Measures:  No flowsheet data found.    No flowsheet data found.    No flowsheet data found.    No flowsheet data found.    No  flowsheet data found.    No flowsheet data found.    ----------------------------------------------------------------------------------------------------------------------    Review of Imaging Studies:  Unavailable, last reportedly had MRI brain and spine January 2017    Review of Labs:  1. CSF reportedly with oligoclonal bands  JC virus 0.24  (09/11/15), 0.20 (01/02/16)    Assessment:   1.  Clinically definite Multiple Sclerosis, relapsing form, clinically stable but no imaging for a while    Atypical features (red flags) include: seizure at onset  Individual risk factors for disease activity and worsening disability include: None known but no imaging studies available    2.  Neuropathic pain: better on current regimen    3.  Anxiety and depression: better    4. 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 IgG 4 related lymphadenopathy; isolated to cervical lymph notes     After discussing the options available and the potential benefits (pros) and risks (cons), we mutually decided on the following management plan    Plan:  1. MRI brain w/w/o gad  2. Pre rituximab labs  3. Rituximab 500 mg X 2 infusions once cleared; she will also be seeing Dr Candiss Norse in rheumatology soon to discuss  4. Stop tysabri after rituximab approved . We may continue off tysabri or restart in 3-4 months depending on response to rituximab  5. Take amantadine 100 mg in the morning and afternoon and the provigil just 100 mg in morning; okay to increase ot 200 mg as needed  6. Follow-up 4 months        Total time spent with the patient today was 50 minutes with over 50% of the time spent on education and counseling regarding their problem list and the assessment and plan

## 2017-06-20 ENCOUNTER — Telehealth (HOSPITAL_BASED_OUTPATIENT_CLINIC_OR_DEPARTMENT_OTHER): Payer: Self-pay

## 2017-06-20 ENCOUNTER — Ambulatory Visit (HOSPITAL_BASED_OUTPATIENT_CLINIC_OR_DEPARTMENT_OTHER): Admit: 2017-06-20 | Payer: Medicare Other

## 2017-06-20 DIAGNOSIS — F1120 Opioid dependence, uncomplicated: Secondary | ICD-10-CM | POA: Diagnosis not present

## 2017-06-20 DIAGNOSIS — M5136 Other intervertebral disc degeneration, lumbar region: Secondary | ICD-10-CM | POA: Diagnosis not present

## 2017-06-20 DIAGNOSIS — Z79891 Long term (current) use of opiate analgesic: Secondary | ICD-10-CM | POA: Diagnosis not present

## 2017-06-20 DIAGNOSIS — Z8781 Personal history of (healed) traumatic fracture: Secondary | ICD-10-CM | POA: Diagnosis not present

## 2017-06-20 DIAGNOSIS — G5600 Carpal tunnel syndrome, unspecified upper limb: Secondary | ICD-10-CM | POA: Diagnosis not present

## 2017-06-20 DIAGNOSIS — G894 Chronic pain syndrome: Secondary | ICD-10-CM | POA: Diagnosis not present

## 2017-06-20 DIAGNOSIS — Z720 Tobacco use: Secondary | ICD-10-CM | POA: Diagnosis not present

## 2017-06-20 DIAGNOSIS — M546 Pain in thoracic spine: Secondary | ICD-10-CM | POA: Diagnosis not present

## 2017-06-20 DIAGNOSIS — M5090 Cervical disc disorder, unspecified, unspecified cervical region: Secondary | ICD-10-CM | POA: Diagnosis not present

## 2017-06-20 LAB — QUANTIFERON-TB, BLOOD
Quantiferon TB: NEGATIVE
TB Antigen - Nil: 0.054 [IU]/mL

## 2017-06-20 NOTE — Telephone Encounter (Signed)
Called patient to schedule RITUXAN. No answer left message to call back.

## 2017-06-23 ENCOUNTER — Ambulatory Visit (HOSPITAL_BASED_OUTPATIENT_CLINIC_OR_DEPARTMENT_OTHER): Payer: Medicare Other

## 2017-06-27 ENCOUNTER — Encounter (INDEPENDENT_AMBULATORY_CARE_PROVIDER_SITE_OTHER): Payer: Self-pay | Admitting: Internal Medicine

## 2017-06-27 NOTE — Telephone Encounter (Signed)
Tried reaching pt to schedule RITUXAN at our [facility] office. Left a message to pt to please call back when able.  Will wait for pt to call back then try calling again later on this week. This was the 2nd attempt to reach pt to schedule.

## 2017-06-27 NOTE — Telephone Encounter (Signed)
Pt called our [place] office back to schedule RITUXAN. Pt agreed to schedule an apt for Friday 07/01/17.

## 2017-06-28 ENCOUNTER — Ambulatory Visit (INDEPENDENT_AMBULATORY_CARE_PROVIDER_SITE_OTHER): Payer: Medicare Other | Admitting: Ophthalmology

## 2017-06-28 ENCOUNTER — Ambulatory Visit
Admission: RE | Admit: 2017-06-28 | Discharge: 2017-06-28 | Disposition: A | Discharge status: Home / Self Care | Payer: Medicare Other | Attending: Neurology | Admitting: Neurology

## 2017-06-28 DIAGNOSIS — G35 Multiple sclerosis: Secondary | ICD-10-CM | POA: Insufficient documentation

## 2017-06-28 DIAGNOSIS — H209 Unspecified iridocyclitis: Principal | ICD-10-CM | POA: Insufficient documentation

## 2017-06-28 DIAGNOSIS — D8989 Other specified disorders involving the immune mechanism, not elsewhere classified: Secondary | ICD-10-CM | POA: Insufficient documentation

## 2017-06-28 DIAGNOSIS — Z5112 Encounter for antineoplastic immunotherapy: Secondary | ICD-10-CM | POA: Insufficient documentation

## 2017-07-01 ENCOUNTER — Ambulatory Visit (HOSPITAL_BASED_OUTPATIENT_CLINIC_OR_DEPARTMENT_OTHER): Admit: 2017-07-01 | Discharge: 2017-07-01 | Disposition: A | Discharge status: Home Health | Payer: Medicare Other

## 2017-07-01 VITALS — BP 109/66 | HR 93 | Temp 98.4°F | Resp 16 | Ht 64.0 in | Wt 144.6 lb

## 2017-07-01 DIAGNOSIS — H209 Unspecified iridocyclitis: Secondary | ICD-10-CM

## 2017-07-01 DIAGNOSIS — Z5112 Encounter for antineoplastic immunotherapy: Secondary | ICD-10-CM

## 2017-07-01 DIAGNOSIS — G35 Multiple sclerosis: Principal | ICD-10-CM

## 2017-07-01 DIAGNOSIS — D8989 Other specified disorders involving the immune mechanism, not elsewhere classified: Secondary | ICD-10-CM

## 2017-07-01 LAB — CBC WITH DIFF, BLOOD
ANC-Automated: 2.8 10*3/uL (ref 1.6–7.0)
ANC-Instrument: 2.8 10*3/uL (ref 1.6–7.0)
Abs Basophils: 0 10*3/uL (ref <0.1)
Abs Eosinophils: 0.3 10*3/uL (ref 0.1–0.5)
Abs Lymphs: 5.2 10*3/uL — ABNORMAL HIGH (ref 0.8–3.1)
Abs Monos: 0.9 10*3/uL — ABNORMAL HIGH (ref 0.2–0.8)
Basophils: 0 %
Eosinophils: 4 %
Hct: 37 % (ref 34.0–45.0)
Hgb: 12.3 gm/dL (ref 11.2–15.7)
Lymphocytes: 56 %
MCH: 30.1 pg (ref 26.0–32.0)
MCHC: 33.2 g/dL (ref 32.0–36.0)
MCV: 90.7 um3 (ref 79.0–95.0)
MPV: 9.2 fL — ABNORMAL LOW (ref 9.4–12.4)
Monocytes: 10 %
Plt Count: 218 10*3/uL (ref 140–370)
RBC: 4.08 10*6/uL (ref 3.90–5.20)
RDW: 13 % (ref 12.0–14.0)
Segs: 30 %
WBC: 9.2 10*3/uL (ref 4.0–10.0)

## 2017-07-01 LAB — COMPREHENSIVE METABOLIC PANEL, BLOOD
ALT (SGPT): 16 U/L (ref 0–33)
AST (SGOT): 24 U/L (ref 0–32)
Albumin: 4.4 g/dL (ref 3.5–5.2)
Alkaline Phos: 48 U/L (ref 35–140)
Anion Gap: 13 mmol/L (ref 7–15)
BUN: 15 mg/dL (ref 6–20)
Bicarbonate: 25 mmol/L (ref 22–29)
Bilirubin, Tot: 0.42 mg/dL (ref <1.2)
Calcium: 9.7 mg/dL (ref 8.5–10.6)
Chloride: 100 mmol/L (ref 98–107)
Creatinine: 0.81 mg/dL (ref 0.51–0.95)
GFR: >60 mL/min
Glucose: 79 mg/dL (ref 70–99)
Potassium: 4 mmol/L (ref 3.5–5.1)
Sodium: 138 mmol/L (ref 136–145)
Total Protein: 7.4 g/dL (ref 6.0–8.0)

## 2017-07-01 MED ORDER — ACETAMINOPHEN 325 MG PO TABS
325.0000 mg | ORAL_TABLET | Freq: Once | ORAL | Status: AC
Start: 2017-07-01 — End: 2017-07-01
  Administered 2017-07-01: 325 mg via ORAL
  Filled 2017-07-01: qty 1

## 2017-07-01 MED ORDER — GRANISETRON HCL 1 MG/ML IV SOLN
1.0000 mg | Freq: Once | INTRAVENOUS | Status: AC
Start: 2017-07-01 — End: 2017-07-01
  Administered 2017-07-01: 1 mg via INTRAVENOUS
  Filled 2017-07-01: qty 1

## 2017-07-01 MED ORDER — METHYLPREDNISOLONE SODIUM SUCC 125 MG IJ SOLR CUSTOM
100.0000 mg | Freq: Once | INTRAMUSCULAR | Status: AC
Start: 2017-07-01 — End: 2017-07-01
  Administered 2017-07-01: 100 mg via INTRAVENOUS
  Filled 2017-07-01: qty 125

## 2017-07-01 MED ORDER — DIPHENHYDRAMINE HCL 50 MG/ML IJ SOLN
25.0000 mg | Freq: Once | INTRAMUSCULAR | Status: AC
Start: 2017-07-01 — End: 2017-07-01
  Administered 2017-07-01: 25 mg via INTRAVENOUS
  Filled 2017-07-01: qty 50

## 2017-07-01 MED ORDER — SODIUM CHLORIDE 0.9 % IV SOLN
INTRAVENOUS | Status: AC
Start: 2017-07-01 — End: 2017-07-01
  Administered 2017-07-01: 10:00:00 via INTRAVENOUS

## 2017-07-01 MED ORDER — ACETAMINOPHEN 325 MG PO TABS
650.0000 mg | ORAL_TABLET | Freq: Once | ORAL | Status: AC
Start: 2017-07-01 — End: 2017-07-01
  Administered 2017-07-01: 650 mg via ORAL
  Filled 2017-07-01: qty 2

## 2017-07-01 MED ORDER — SODIUM CHLORIDE 0.9 % IV SOLN
500.0000 mg | Freq: Once | INTRAVENOUS | Status: AC
Start: 2017-07-01 — End: 2017-07-01
  Administered 2017-07-01 (×7): 500 mg via INTRAVENOUS
  Filled 2017-07-01: qty 50

## 2017-07-01 NOTE — Progress Notes (Signed)
At infusion rate of 200 ml/hour complained of bit of nausea with mild headache, otherwise patient had no other complaints.  Orders written for 1 time tylenol and kytril.

## 2017-07-01 NOTE — Interdisciplinary (Signed)
Non-Chemotherapy Infusion Nursing Note - [place] [facility]    Debra Bolton is a 57 year old female who presents for infusion of C1D1 Rituximab 500 mg for her MS.  PT IS ALERT AND AMBULATORY.  PT HAS NO DISTRESS OR COMPLAINTS OF ANY DISCOMFORT.  PT HAS NO REPORT OF FEVER OR CHILL.  PT HAS NO SHORTNESS OF BREATH.     Vitals:    07/01/17 0928   BP: 129/75   BP Location: Left arm   BP Patient Position: Sitting   Pulse: 79   Resp: 16   Temp: 97.8 F (36.6 C)   TempSrc: Oral   SpO2: 98%   Weight: 65.6 kg (144 lb 9.6 oz)   Height: _0  (1.626 m)     Pain Score: NA (pre med, non-pain or scheduled)  Body surface area is 1.72 meters squared.  Body mass index is 24.82 kg/m.    Pre-treatment nursing assessment:  No problems identified upon assessment.  Met parameters.    Earle [name] tolerated po tylenol, IVP Benadryl 59m, and IVP solu-medrol 1042mas premeds, IV Rituxan 50060mith titrated rates starting at 34m76m for an hour, then increase rates to 100ml37m 134ml/42m200ml/h58m34ml/hr76m0ml/hr,5mml/hr e48m 30 mins untile completion  treatment well.    Pt had a mild headache, rating 4/10 on the pain scale and some nausea also.  Scott ShufBlanche Easteing pt at the chairside.  VS are stable at that time.  Additional tylenol 325mg and I60mytril given.    Pt reported her headache and nausea were subsided 30 mins post meds.    Post blood return: Brisk  Post-Flush: NS and Discontinued IV  Medications   sodium chloride 0.9% infusion ( IntraVENOUS Stopped 07/01/17 1520)   acetaminophen (TYLENOL) tablet 650 mg (650 mg Oral Given 07/01/17 1013)   diphenhydrAMINE (BENADRYL) injection 25 mg (25 mg IntraVENOUS Given 07/01/17 1016)   methylPREDNISolone sodium succinate (SOLU-MEDROL) injection 100 mg (100 mg IntraVENOUS Given 07/01/17 1014)   riTUXimab (RITUXAN) 500 mg in sodium chloride 0.9 % 500 mL infusion (0 mg IntraVENOUS Stopped 07/01/17 1450)   acetaminophen (TYLENOL) tablet 325 mg (325 mg Oral Given 07/01/17 1302)      granisetron (KYTRIL) injection 1 mg (1 mg IntraVENOUS Given 07/01/17 1303)       Patient Education  Learner: Patient  Barriers to learning: No Barriers  Readiness to learn: Acceptance  Method: Explanation and Handout  Instruct pt to check her oral temp and she can take oral tylenol after 6pm today should she has a headache.  Pt verbalized understanding.    Treatment Education: Information/teaching given to patient including: signs and symptoms of infection, bleeding, adverse reaction(s), symptom control, and when to notify MD.    Fall PrevenLytle Michaels Education: Instructed patient to call for assistance.    Pain Education: Patient instructed to contact nurse if pain should develop or if their current pain therapy becomes ineffective.    Response: Verbalizes understanding    Discharge Plan  Discharge instructions given to patient.  Future appointments given and reviewed with treatment plan.  Discharge Mode: Ambulatory  Discharge Time: 1520  Accompanied by: Self  Discharged To: Home

## 2017-07-06 ENCOUNTER — Encounter (INDEPENDENT_AMBULATORY_CARE_PROVIDER_SITE_OTHER): Payer: Self-pay | Admitting: Internal Medicine

## 2017-07-06 ENCOUNTER — Ambulatory Visit (INDEPENDENT_AMBULATORY_CARE_PROVIDER_SITE_OTHER): Payer: Medicare Other | Admitting: Internal Medicine

## 2017-07-06 ENCOUNTER — Other Ambulatory Visit (INDEPENDENT_AMBULATORY_CARE_PROVIDER_SITE_OTHER): Payer: Self-pay | Admitting: Family Practice

## 2017-07-06 VITALS — BP 112/79 | HR 85 | Temp 99.0°F | Resp 15 | Ht 64.0 in | Wt 141.0 lb

## 2017-07-06 DIAGNOSIS — H209 Unspecified iridocyclitis: Secondary | ICD-10-CM

## 2017-07-06 DIAGNOSIS — D8989 Other specified disorders involving the immune mechanism, not elsewhere classified: Principal | ICD-10-CM

## 2017-07-06 DIAGNOSIS — K1120 Sialoadenitis, unspecified: Secondary | ICD-10-CM

## 2017-07-06 DIAGNOSIS — M7551 Bursitis of right shoulder: Secondary | ICD-10-CM

## 2017-07-06 DIAGNOSIS — R590 Localized enlarged lymph nodes: Secondary | ICD-10-CM

## 2017-07-06 DIAGNOSIS — G35 Multiple sclerosis: Secondary | ICD-10-CM

## 2017-07-06 NOTE — Progress Notes (Signed)
Referring Provider:  Edi, Verl Blalock, MD  [address] / [place]  [place], [place] [address]    Chief Complaint  Chief Complaint   Patient presents with    Follow Up   1. Multiple Sclerosis, relapsing form  2. Anterior uveitis  3. IgG related lymphadenopathy    History of present Illness  57 year old female with history of multiple sclerosis diagnosed 1991, presented with numbness in both legs, seizures of 4 years duration.  Multiple relapses since then.  Previously treated with Copaxone.  Started Natalizumab in 2014 with good response and improvement in pain and numbness in the extremities.  Currently taking Cymbalta and gabapentin which appear to help with pain in her extremities.  Chronic right shoulder pain secondary to previous injury, no previous bursa injections.  Pain worse with use and lifting heavy with the right arm.  No other joints with persistent pain swelling or morning stiffness at this time.  History of anterior uveitis for 1 year, treated with topical corticosteroids.  Seen by Ophthalmology at [facility].  Previous labs from [place] from April 2017 revealed normal SSA, SSB, QuantiFERON TB, cell counts, CMP, inflammatory markers.    She is a chronic heavy smoker, about a year ago she had change in voice since cervical lymphadenopathy.  Underwent removal of enlarged tonsil and cervical lymph node to rule out malignancy.  Biopsy was negative for malignancy, however suggested IgG4 related disease with 50 cells per high-power field in lymph node with IgG4 staining.  Workup for systemic disease was done with CT chest abdomen pelvis 08/11/2015 which was negative for lymphadenopathy and for retroperitoneal fibrosis.  No pancreatitis.  Neck ultrasound showed bilateral submandibular gland enlargement with heterogeneous echotexture, likely secondary to IgG4 related disease    Switched from natalizumab to rituximab for multiple sclerosis, in light of history of uveitis and IgG4 related disease, by  Neurology.  Received 1st infusion of rituximab 1 week ago, tolerated well, 2nd infusion next week.  Notes some hot flashes, no other side effects.      Notes mild chronic fatigue.  Underwent right subacromial bursa injection for bursitis few months ago with excellent response, now has recurrence of right shoulder pain, requests repeat injection.  Doing yoga regularly, which has helped her pain tremendously.  No other joints with pain swelling or stiffness.    No unexpected appetite or weight changes.  No persistent fever.  No abdominal pain    Skin: No photosensitivity, rash or ulceration. No skin psoriasis  Hair: No unusual hair loss  Oral: No oral ulceration or sore throat   Eyes:  Per HPI  Ears: No changes in hearing, ear pain or discharge  Abd: No discomfort. No change in bowel habits  Resp: No difficulty breathing, chest pain or chronic cough  Genitourinary: No difficulty or pain with urination  Systemic: No fever, malaise, changes in weight or appetite. No h/o recurrent infections  Neurological:  Per HPI  Extremities: No h/o Raynaud's phenomenon. No other joints with swelling, pain or redness    Review of Systems  As per HPI    Medications and Allergies were reviewed with the patient.       Current Outpatient Medications:     baclofen (LIORESAL) 10 MG tablet, Take 1 tablet (10 mg) by mouth daily., Disp: , Rfl: 0    cyclopentolate (CYCLODRYL) 1 % ophthalmic solution, Place 1 drop into right eye 3 times daily., Disp: 1 bottle, Rfl: 1    diazepam (  VALIUM) 5 MG tablet, Take 1 tablet (5 mg) by mouth once a week., Disp: , Rfl:     DULoxetine (CYMBALTA) 60 MG CR capsule, Take 2 capsules (120 mg) by mouth daily., Disp: 60 capsule, Rfl: 5    gabapentin (NEURONTIN) 300 MG capsule, Take 2 capsules (600 mg) by mouth nightly., Disp: , Rfl:     nortriptyline (PAMELOR) 25 MG capsule, Take 2 capsules (50 mg) by mouth nightly., Disp: 60 capsule, Rfl: 5    prednisoLONE acetate (PRED FORTE) 1 % ophthalmic suspension,  Place 1 drop into right eye 4 times daily., Disp: 1 bottle, Rfl: 1    thyroid (ARMOUR THYROID) 60 MG tablet, Take 1 tablet (60 mg) by mouth daily., Disp: 90 tablet, Rfl: 3    tiZANidine (ZANAFLEX) 4 MG tablet, Take 1.5 tablets (6 mg) by mouth at bedtime., Disp: 45 tablet, Rfl: 2    Allergies  Penicillins and Modafinil    Past Medical History:   Diagnosis Date    Asthma 1991    with pregnancy    Hypothyroidism     Major depressive disorder, single episode     MS (multiple sclerosis) (CMS-HCC)        Past Surgical History:   Procedure Laterality Date    LYMPH NODE BIOPSY      IgG4 related disease    TONSILLECTOMY AND ADENOIDECTOMY      only tonsillectomy       Family History   Problem Relation Name Age of Onset    Other Mother Zara Chess     Thyroid Mother Zara Chess     Cancer Father don mc clain         lung    Cancer Brother      Cancer Sister Velva Harman mcclain         breast    Diabetes Sister suzanne mcclain     Stroke Brother Don mcclain        Social History     Socioeconomic History    Marital status: Divorced     Spouse name: Not on file    Number of children: Not on file    Years of education: Not on file    Highest education level: Not on file   Occupational History    Not on file   Social Needs    Financial resource strain: Not on file    Food insecurity:     Worry: Not on file     Inability: Not on file    Transportation needs:     Medical: Not on file     Non-medical: Not on file   Tobacco Use    Smoking status: Former Smoker     Packs/day: 1.00     Years: 26.00     Pack years: 26.00     Types: Cigarettes     Last attempt to quit: 02/01/2016     Years since quitting: 1.4    Smokeless tobacco: Never Used   Substance and Sexual Activity    Alcohol use: Yes     Alcohol/week: 10.0 oz     Types: 4 Glasses of wine per week    Drug use: Not on file    Sexual activity: Yes     Partners: Male   Lifestyle    Physical activity:     Days per week: Not on file     Minutes per session: Not  on file    Stress: Not on file   Relationships  Social connections:     Talks on phone: Not on file     Gets together: Not on file     Attends religious service: Not on file     Active member of club or organization: Not on file     Attends meetings of clubs or organizations: Not on file     Relationship status: Not on file    Intimate partner violence:     Fear of current or ex partner: Not on file     Emotionally abused: Not on file     Physically abused: Not on file     Forced sexual activity: Not on file   Other Topics Concern    Military Service Not Asked    Blood Transfusions Not Asked    Caffeine Concern Not Asked    Occupational Exposure Not Asked    Hobby Hazards Not Asked    Sleep Concern Not Asked    Stress Concern Not Asked    Weight Concern Not Asked    Special Diet Not Asked    Back Care Not Asked    Exercise Not Asked    Bike Helmet Not Asked    Seat Belt Not Asked    Self-Exams Not Asked   Social History Narrative    Moved from [place] is great, high fiber, she is very conscientious of effect of diet on health.    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    Hasn't had a cigarette in 8 months.    Has smoked 26 years, less than a pack a day.    Currently drinks 2-4 drinks a day on the weekends, total 4-8 per week.    Sexually active with one female partner.    Currently not working, has been on disability for most of the time from 1992 to now.         Physical Exam  Vitals:    07/06/17 1034   BP: 112/79   Pulse: 85   Resp: 15   Temp: 99 F (37.2 C)   TempSrc: Oral   Weight: 64 kg (141 lb)   Height: 5' 4"  (1.626 m)     Gen: Alert, cooperative  Psych: Appropriate affect  Neck:  Prominent submandibular lymph nodes, single nonmatted, freely mobile, nontender, bilateral  Skin: No rheumatologic rashes or ulcers  Eyes: No conjunctival erythema, EOMI. No scleral icterus  ENT: No oral mucositis, no sinus tenderness, no ear discharge  CV: Regular rhythm, normal rate  Resp: Breathing comfortably, no  tachypnea  Abd: Soft, non-tender  Joint exam: A complete joint exam revealed moderate pain on resisted abduction of right shoulder consistent with right subacromial bursitis. No active synovitis in upper or lower extremity joints. Good range of motion in shoulder and pelvic girdles  Neuro: Oriented to time, place and person         Most recent labs and imaging were reviewed and discussed with the patient.  50 pages of outside records reviewed, outlined in HPI    08/08/2015  IgG4 level 215 upper limit 176m/dl  Total IgG 1050 upper limit 1590    01/12/17  IgG4 level 193  03/29/17:  IgG4 -205.  Normal IgG    Impression    ICD-10-CM ICD-9-CM   1. 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 Uveitis of right eye H20.9 364.3   3. Multiple sclerosis (CMS-HCC) G35 340   4. Subacromial bursitis of right shoulder joint M75.51 726.19  5. Lymphadenopathy, cervical R59.0 785.6   6. Sialadenitis K11.20 527.2       Reighlyn Estey is a 57 year old female with multiple sclerosis, followed by Neurology.  Previously well controlled on treatment with natalizumab.  Recently switched by Neurology to rituximab due to history of IgG4 related disease, with cervical lymphadenopathy, submandibular gland enlargement and recurrent uveitis of right eye.  Chronic nerve pain well controlled with Cymbalta and gabapentin.    IgG4 related disease on histopathology of enlarged right cervical lymph nodes.  No pancreatitis, no other systemic lymphadenopathy on CT chest abdomen pelvis performed April 2017.    History 2 episodes of anterior uveitis in the last 1 year.  Negative ANA HLA B27, Qft TB    PLAN:  - agree with rituximab 1 g IV every 2 weeks-2 doses.  Consider repeat in 6 months for treatment of both MS and IgG4 related disease  - consider repeat neck ultrasound at next visit to assess for improvement in cervical lymph nodes and sub mandibular gland enlargement after treatment with rituximab  - monitor IgG4 levels    - methotrexate can be  considered for recurrent uveitis, if poor response to rituximab    - repeat right subacromial bursa corticosteroid injection performed in clinic today.  Procedure note below for details    No orders of the defined types were placed in this encounter.       Requested Prescriptions      No prescriptions requested or ordered in this encounter       Return in about 3 months (around 10/06/2017).     Procedure note:  Right subacromial bursa corticosteroid injection  Risks and benefits of procedure discussed with patient. Written and verbal, witnessed informed consent was obtained. A time out period was observed to identify the correct patient, side and site.   After palpation exam, area was marked for injection and thoroughly cleaned with chloraprep.   Ethyl chloride used for local anesthesia prior to procedure. Under sterile precautions, 40 mg of Depo-Medrol mixed with 2 cc xylocaine injected into the right subacromial bursa. Bandaid applied. Patient tolerated the procedure well, no post-procedure complications. Advised to avoid soaking in water and avoidance of excessive activity involving the joint for 24 hrs.

## 2017-07-07 ENCOUNTER — Ambulatory Visit (HOSPITAL_BASED_OUTPATIENT_CLINIC_OR_DEPARTMENT_OTHER): Payer: Medicare Other

## 2017-07-15 ENCOUNTER — Ambulatory Visit (HOSPITAL_BASED_OUTPATIENT_CLINIC_OR_DEPARTMENT_OTHER): Payer: Medicare Other

## 2017-07-18 ENCOUNTER — Ambulatory Visit (HOSPITAL_BASED_OUTPATIENT_CLINIC_OR_DEPARTMENT_OTHER): Payer: Medicare Other

## 2017-07-19 ENCOUNTER — Ambulatory Visit
Admission: RE | Admit: 2017-07-19 | Discharge: 2017-07-19 | Disposition: A | Discharge status: Home / Self Care | Payer: Medicare Other | Attending: Neurology | Admitting: Neurology

## 2017-07-19 VITALS — BP 119/80 | HR 89 | Temp 98.3°F | Resp 16 | Ht 64.0 in | Wt 138.5 lb

## 2017-07-19 DIAGNOSIS — Z5112 Encounter for antineoplastic immunotherapy: Secondary | ICD-10-CM | POA: Insufficient documentation

## 2017-07-19 DIAGNOSIS — G35 Multiple sclerosis: Secondary | ICD-10-CM | POA: Insufficient documentation

## 2017-07-19 DIAGNOSIS — H209 Unspecified iridocyclitis: Principal | ICD-10-CM | POA: Insufficient documentation

## 2017-07-19 DIAGNOSIS — D8989 Other specified disorders involving the immune mechanism, not elsewhere classified: Secondary | ICD-10-CM | POA: Insufficient documentation

## 2017-07-19 LAB — CBC WITH DIFF, BLOOD
ANC-Automated: 3.2 10*3/uL (ref 1.6–7.0)
ANC-Instrument: 3.2 10*3/uL (ref 1.6–7.0)
Abs Basophils: 0 10*3/uL (ref <0.1)
Abs Eosinophils: 0.3 10*3/uL (ref 0.1–0.5)
Abs Lymphs: 3.6 10*3/uL — ABNORMAL HIGH (ref 0.8–3.1)
Abs Monos: 0.8 10*3/uL (ref 0.2–0.8)
Basophils: 0 %
Eosinophils: 4 %
Hct: 37.8 % (ref 34.0–45.0)
Hgb: 12.7 gm/dL (ref 11.2–15.7)
Lymphocytes: 46 %
MCH: 29.8 pg (ref 26.0–32.0)
MCHC: 33.6 g/dL (ref 32.0–36.0)
MCV: 88.7 um3 (ref 79.0–95.0)
MPV: 9.2 fL — ABNORMAL LOW (ref 9.4–12.4)
Monocytes: 10 %
Plt Count: 230 10*3/uL (ref 140–370)
RBC: 4.26 10*6/uL (ref 3.90–5.20)
RDW: 13.2 % (ref 12.0–14.0)
Segs: 40 %
WBC: 7.9 10*3/uL (ref 4.0–10.0)

## 2017-07-19 LAB — COMPREHENSIVE METABOLIC PANEL, BLOOD
ALT (SGPT): 21 U/L (ref 0–33)
AST (SGOT): 25 U/L (ref 0–32)
Albumin: 4.4 g/dL (ref 3.5–5.2)
Alkaline Phos: 52 U/L (ref 35–140)
Anion Gap: 13 mmol/L (ref 7–15)
BUN: 13 mg/dL (ref 6–20)
Bicarbonate: 24 mmol/L (ref 22–29)
Bilirubin, Tot: 0.34 mg/dL (ref <1.2)
Calcium: 9.8 mg/dL (ref 8.5–10.6)
Chloride: 101 mmol/L (ref 98–107)
Creatinine: 0.73 mg/dL (ref 0.51–0.95)
GFR: >60 mL/min
Glucose: 93 mg/dL (ref 70–99)
Potassium: 3.9 mmol/L (ref 3.5–5.1)
Sodium: 138 mmol/L (ref 136–145)
Total Protein: 7.4 g/dL (ref 6.0–8.0)

## 2017-07-19 MED ORDER — METHYLPREDNISOLONE SODIUM SUCC 125 MG IJ SOLR CUSTOM
100.0000 mg | Freq: Once | INTRAMUSCULAR | Status: AC
Start: 2017-07-19 — End: 2017-07-19
  Administered 2017-07-19: 100 mg via INTRAVENOUS
  Filled 2017-07-19: qty 125

## 2017-07-19 MED ORDER — ACETAMINOPHEN 325 MG PO TABS
650.0000 mg | ORAL_TABLET | Freq: Once | ORAL | Status: AC
Start: 2017-07-19 — End: 2017-07-19
  Administered 2017-07-19 (×2): 650 mg via ORAL
  Filled 2017-07-19: qty 2

## 2017-07-19 MED ORDER — DIPHENHYDRAMINE HCL 50 MG/ML IJ SOLN
25.0000 mg | Freq: Once | INTRAMUSCULAR | Status: AC
Start: 2017-07-19 — End: 2017-07-19
  Administered 2017-07-19: 25 mg via INTRAVENOUS
  Filled 2017-07-19: qty 50

## 2017-07-19 MED ORDER — SODIUM CHLORIDE 0.9 % IV SOLN
500.0000 mg | Freq: Once | INTRAVENOUS | Status: AC
Start: 2017-07-19 — End: 2017-07-19
  Administered 2017-07-19: 500 mg via INTRAVENOUS
  Filled 2017-07-19: qty 50

## 2017-07-19 NOTE — Interdisciplinary (Signed)
Chemotherapy Nursing Note - Debra Bolton    Debra Bolton is a 57 year old female who presents for chemotherapy cycle 1, day(s) 15 of Rituximab (E0814).    Current Chemotherapy Signed Informed Consent verified/obtained yes    Pre-treatment nursing assessment:  No problems identified upon assessment. Pt arrived to IC with steady gait unaccompanied. VSS and pt in no apparent distress. Pt denies any pain, n/v/d/c, sob, or fever/chills. PIV placed to left lower arm. Labs drawn. CBC reviewed.  Pt oriented to call light and restrooms and encouraged to notify RN/staff for assistance to restroom. Pt verbalizes understanding.       Patient met treatment parameters.    Vitals:    07/19/17 1310 07/19/17 1340 07/19/17 1410 07/19/17 1445   BP: 106/68 111/68 114/73 119/80   BP Location: Right arm Right arm Right arm Right arm   BP Patient Position: Sitting Sitting Sitting Sitting   Pulse: 72 75 80 89   Resp: _0 Temp: 98 F (36.7 C) 98.4 F (36.9 C) 98.4 F (36.9 C) 98.3 F (36.8 C)   TempSrc: Oral Oral Oral Oral   SpO2:       Weight:       Height:         Pain Score: NA (pre med, non-pain or scheduled)  Body surface area is 1.68 meters squared.  Body mass index is 23.77 kg/m.    Chemotherapy:    Medications   acetaminophen (TYLENOL) tablet 650 mg (650 mg Oral Given 07/19/17 1146)   diphenhydrAMINE (BENADRYL) injection 25 mg (25 mg IntraVENOUS Given 07/19/17 1147)   methylPREDNISolone sodium succinate (SOLU-MEDROL) injection 100 mg (100 mg IntraVENOUS Given 07/19/17 1147)   riTUXimab (RITUXAN) 500 mg in sodium chloride 0.9 % 500 mL infusion (0 mg IntraVENOUS Stopped 07/19/17 1445)       [name] tolerated treatment well.    Post blood return: Brisk  IV access post infusion: Other: PIV dc      Patient Education  Learner: Patient  Barriers to learning: No Barriers  Readiness to learn: Acceptance  Method: Explanation    Wellbeing Assessment  Discussed; declines    Treatment Education: Information/teaching given  to patient including: signs and symptoms of infection, bleeding, adverse reaction(s), symptom control, and when to notify MD.    [name]: Instructed patient to call for assistance.    Pain Education: Patient instructed to contact nurse if pain should develop or if their current pain therapy becomes ineffective.    Response: Verbalizes understanding    Discharge Plan  Discharge instructions given to patient.  Future appointments:date given and reviewed with treatment plan.  Discharge Mode: Ambulatory  Accompanied by: Self  Discharged To: Home

## 2017-07-21 ENCOUNTER — Other Ambulatory Visit (INDEPENDENT_AMBULATORY_CARE_PROVIDER_SITE_OTHER): Payer: Self-pay | Admitting: Family Practice

## 2017-07-21 DIAGNOSIS — F1120 Opioid dependence, uncomplicated: Secondary | ICD-10-CM | POA: Diagnosis not present

## 2017-07-21 DIAGNOSIS — Z8781 Personal history of (healed) traumatic fracture: Secondary | ICD-10-CM | POA: Diagnosis not present

## 2017-07-21 DIAGNOSIS — Z79891 Long term (current) use of opiate analgesic: Secondary | ICD-10-CM | POA: Diagnosis not present

## 2017-07-21 DIAGNOSIS — M5136 Other intervertebral disc degeneration, lumbar region: Secondary | ICD-10-CM | POA: Diagnosis not present

## 2017-07-21 DIAGNOSIS — M5090 Cervical disc disorder, unspecified, unspecified cervical region: Secondary | ICD-10-CM | POA: Diagnosis not present

## 2017-07-21 DIAGNOSIS — G894 Chronic pain syndrome: Secondary | ICD-10-CM | POA: Diagnosis not present

## 2017-07-21 DIAGNOSIS — Z720 Tobacco use: Secondary | ICD-10-CM | POA: Diagnosis not present

## 2017-07-21 DIAGNOSIS — M546 Pain in thoracic spine: Secondary | ICD-10-CM | POA: Diagnosis not present

## 2017-07-21 DIAGNOSIS — G5600 Carpal tunnel syndrome, unspecified upper limb: Secondary | ICD-10-CM | POA: Diagnosis not present

## 2017-07-21 DIAGNOSIS — E039 Hypothyroidism, unspecified: Secondary | ICD-10-CM

## 2017-07-22 ENCOUNTER — Telehealth (INDEPENDENT_AMBULATORY_CARE_PROVIDER_SITE_OTHER): Payer: Self-pay | Admitting: Family Practice

## 2017-07-22 DIAGNOSIS — E039 Hypothyroidism, unspecified: Principal | ICD-10-CM

## 2017-07-22 NOTE — Telephone Encounter (Signed)
Hi Dr Edi,     Last TSH was suppressed, pt taking only Armour Thyroid.  I dont see any notes re: her thyroid levels after last labs.  Would you like to repeat labs to see if she requires a dose adjustment, as it has been almost a year since last?   Please advise.     Per protocol will defer refills to you for approval.     Last visit:  01/19/17  Next scheduled appointment:  none    Lab Results   Component Value Date    TSH 0.01 (L) 10/01/2016    FREET4 0.56 (L) 10/28/2016    T3 0.7 (L) 10/28/2016     Thanks,    Ambrose Pancoast, PharmD BCACP  [facility]   Phone:  [phone]  Ext:  [phone]

## 2017-07-22 NOTE — Telephone Encounter (Signed)
Hypothyroid Medication Refill Protocol    Last visit:  01/19/17  Next scheduled appointment:  none      LABS required:  (Q year TSH)  *If recent dose change: check TSH 2 months after starting new  dose.    Lab Results   Component Value Date    TSH 0.01 (L) 10/01/2016    FREET4 0.56 (L) 10/28/2016    T3 0.7 (L) 10/28/2016         Monitoring required:  (Q year BP, HR, Wt)   *If pt is pregnant, DEFER TO MD*    (BP range: UUVOZDGU=44-034  VQQVZDGLO=75-64)  Blood Pressure   07/19/17 119/80   07/06/17 112/79   07/01/17 109/66       (HR range: 55-110)   Pulse Readings from Last 3 Encounters:   07/19/17 89   07/06/17 85   07/01/17 93       Wt Readings from Last 3 Encounters:   07/19/17 62.8 kg (138 lb 8 oz)   07/06/17 64 kg (141 lb)   07/01/17 65.6 kg (144 lb 9.6 oz)

## 2017-07-24 NOTE — Telephone Encounter (Signed)
Covering 4/5 for Dr. Rana Snare    I agree with Ambrose Pancoast to get new thyroid function labs now for this patient.    However these should be ordered as results to go back to her as primary care physician.    Therefore I won't sign them but would ask that pharmacy team or Dr. Rana Snare sign off on them on Monday     Thank you!  Gabriel Earing MD    [facility]arl[facility] Mingle entry]

## 2017-07-25 ENCOUNTER — Other Ambulatory Visit (INDEPENDENT_AMBULATORY_CARE_PROVIDER_SITE_OTHER): Payer: Self-pay | Admitting: Psychiatric/Mental Health

## 2017-07-25 DIAGNOSIS — R252 Cramp and spasm: Secondary | ICD-10-CM

## 2017-07-25 MED ORDER — THYROID 60 MG OR TABS
60.00 mg | ORAL_TABLET | Freq: Every day | ORAL | 0 refills | Status: DC
Start: 2017-07-25 — End: 2017-08-15

## 2017-07-25 MED ORDER — TIZANIDINE HCL 4 MG OR TABS
6.0000 mg | ORAL_TABLET | Freq: Every evening | ORAL | 2 refills | Status: DC
Start: 2017-07-25 — End: 2017-11-04

## 2017-07-25 NOTE — Telephone Encounter (Signed)
Debra Bolton,     We received a refill request for the following:      tiZANidine (ZANAFLEX) 4 MG tablet   Sig - Route: Take 1.5 tablets (6 mg) by mouth at bedtime. - Oral       Last Visit: 03/29/2018  Next Visit:  pending    Ph[facility]acy: [facility] [place]  [place]: [phone]    Thank you,    Dondra Prader, RN

## 2017-07-25 NOTE — Telephone Encounter (Signed)
She needs to get labs done to check what her thyroid levels are  I signed the orders  I will only give her 30 tablets so she can get labs  Thanks!

## 2017-07-26 ENCOUNTER — Ambulatory Visit (HOSPITAL_BASED_OUTPATIENT_CLINIC_OR_DEPARTMENT_OTHER): Payer: Medicare Other

## 2017-07-27 NOTE — Telephone Encounter (Signed)
Called and informed patient that labs are due prior to any additional refills

## 2017-08-10 ENCOUNTER — Inpatient Hospital Stay (INDEPENDENT_AMBULATORY_CARE_PROVIDER_SITE_OTHER)
Admit: 2017-08-10 | Discharge: 2017-08-10 | Disposition: A | Discharge status: Home Health | Payer: Medicare Other | Attending: Neurology | Admitting: Neurology

## 2017-08-10 ENCOUNTER — Other Ambulatory Visit (INDEPENDENT_AMBULATORY_CARE_PROVIDER_SITE_OTHER): Payer: Medicare Other | Attending: Family Practice

## 2017-08-10 ENCOUNTER — Ambulatory Visit (INDEPENDENT_AMBULATORY_CARE_PROVIDER_SITE_OTHER): Payer: Medicare Other | Admitting: Ophthalmology

## 2017-08-10 DIAGNOSIS — H209 Unspecified iridocyclitis: Secondary | ICD-10-CM

## 2017-08-10 DIAGNOSIS — H20011 Primary iridocyclitis, right eye: Secondary | ICD-10-CM

## 2017-08-10 DIAGNOSIS — Z135 Encounter for screening for eye and ear disorders: Secondary | ICD-10-CM

## 2017-08-10 DIAGNOSIS — E039 Hypothyroidism, unspecified: Secondary | ICD-10-CM | POA: Insufficient documentation

## 2017-08-10 DIAGNOSIS — G35 Multiple sclerosis: Principal | ICD-10-CM

## 2017-08-10 LAB — TSH, BLOOD: TSH: 0.32 u[IU]/mL (ref 0.27–4.20)

## 2017-08-10 LAB — TOTAL T3, BLOOD: T3 Total: 1 ng/mL (ref 0.8–2.0)

## 2017-08-10 LAB — FREE THYROXINE, BLOOD: Free T4: 0.86 ng/dL — ABNORMAL LOW (ref 0.93–1.70)

## 2017-08-10 MED ORDER — GADOBUTROL 1 MMOL/ML IV SOLN
7.00 mL | Freq: Once | INTRAVENOUS | Status: AC
Start: 2017-08-10 — End: 2017-08-10
  Administered 2017-08-10: 7 mL via INTRAVENOUS

## 2017-08-10 NOTE — Assessment & Plan Note (Addendum)
HPI: Blurred vision ,right eye)s),  gradual onset, mild severity intermittent in duration, stable        OCT Interpretation:    OD: No fluid that is  Stable.    OS: No fluid that is Stable.      Uveitis: mild, quiet, continue PF BID OU

## 2017-08-10 NOTE — Progress Notes (Signed)
Screening for eye condition  -     OCT, Retina - OU - Both Eyes -[place] -Autofluorescence; Optos    Uveitis of right eye  Overview:  H/o of AAU: no minimal.  - h/o f MS, IgG4 stable, on ritxuan    Cataract ID:[id number] wait until quiet for 6 months    ? H/o of amblyopia OD: h/o of poor vision as child    Assessment & Plan:  HPI: Blurred vision ,right eye)s),  gradual onset, mild severity intermittent in duration, stable        OCT Interpretation:    OD: No fluid that is  Stable.    OS: No fluid that is Stable.      Uveitis: mild, quiet, continue PF BID OU            Return in about 3 months (around 11/09/2017).

## 2017-08-11 DIAGNOSIS — H5203 Hypermetropia, bilateral: Secondary | ICD-10-CM | POA: Diagnosis not present

## 2017-08-11 DIAGNOSIS — H52209 Unspecified astigmatism, unspecified eye: Secondary | ICD-10-CM | POA: Diagnosis not present

## 2017-08-11 DIAGNOSIS — H524 Presbyopia: Secondary | ICD-10-CM | POA: Diagnosis not present

## 2017-08-14 ENCOUNTER — Other Ambulatory Visit (INDEPENDENT_AMBULATORY_CARE_PROVIDER_SITE_OTHER): Payer: Self-pay | Admitting: Family Practice

## 2017-08-14 ENCOUNTER — Encounter (INDEPENDENT_AMBULATORY_CARE_PROVIDER_SITE_OTHER): Payer: Self-pay | Admitting: Family Practice

## 2017-08-14 DIAGNOSIS — Z0184 Encounter for antibody response examination: Principal | ICD-10-CM

## 2017-08-14 DIAGNOSIS — E039 Hypothyroidism, unspecified: Secondary | ICD-10-CM

## 2017-08-15 MED ORDER — THYROID 60 MG OR TABS
60.00 mg | ORAL_TABLET | Freq: Every day | ORAL | 0 refills | Status: DC
Start: 2017-08-15 — End: 2017-10-25

## 2017-08-15 NOTE — Telephone Encounter (Signed)
Dr. Edi please advise on message below. Thank you!

## 2017-08-15 NOTE — Telephone Encounter (Signed)
baclofen is not currently included in the Pharmacy Refill Clinic protocols. Re-routing to the responsible staff for processing.  Thank you

## 2017-08-15 NOTE — Telephone Encounter (Signed)
I signed orders for labs  I also refilled her thyroid medication    If she needs booster she has to ask her doctor that prescribed rituxan  Thanks!

## 2017-08-15 NOTE — Telephone Encounter (Signed)
From: Debra Bolton  To: Edi, Verl Blalock, MD  Sent: 08/14/2017 10:29 AM PDT  Subject: 1-Non Urgent Medical Advice    Hi Dr. Rana Snare, I scheduled an apt with you for June 28th at 2:00. I will probably need my thyroid meds refilled before then, but I did have the blood tests you wanted.   I have a question about the measles shot. Should I have my titers checked? and even if they are low can I get a booster shot while taking Rutaxin infusions?   Let me know what you think.    thank you Debra Bolton

## 2017-08-16 NOTE — Telephone Encounter (Signed)
Baclofen was entered as a "historical med". Dr. Rana Snare is PCP. Routing to appropriate staff for determination/refill.     BACLOFEN is not currently included in the Pharmacy Refill Clinic protocols. Re-routing to the responsible staff for processing.  Thank you

## 2017-08-16 NOTE — Telephone Encounter (Signed)
Dr. Rana Snare is not prescribing doctor for medication baclofen (LIORESAL) 10 MG tablet

## 2017-08-19 ENCOUNTER — Other Ambulatory Visit (INDEPENDENT_AMBULATORY_CARE_PROVIDER_SITE_OTHER): Payer: Self-pay | Admitting: Family Practice

## 2017-08-19 DIAGNOSIS — G894 Chronic pain syndrome: Secondary | ICD-10-CM | POA: Diagnosis not present

## 2017-08-19 DIAGNOSIS — G5600 Carpal tunnel syndrome, unspecified upper limb: Secondary | ICD-10-CM | POA: Diagnosis not present

## 2017-08-19 DIAGNOSIS — Z79891 Long term (current) use of opiate analgesic: Secondary | ICD-10-CM | POA: Diagnosis not present

## 2017-08-19 DIAGNOSIS — M5090 Cervical disc disorder, unspecified, unspecified cervical region: Secondary | ICD-10-CM | POA: Diagnosis not present

## 2017-08-19 DIAGNOSIS — M546 Pain in thoracic spine: Secondary | ICD-10-CM | POA: Diagnosis not present

## 2017-08-19 DIAGNOSIS — Z8781 Personal history of (healed) traumatic fracture: Secondary | ICD-10-CM | POA: Diagnosis not present

## 2017-08-19 DIAGNOSIS — M5136 Other intervertebral disc degeneration, lumbar region: Secondary | ICD-10-CM | POA: Diagnosis not present

## 2017-08-19 DIAGNOSIS — Z720 Tobacco use: Secondary | ICD-10-CM | POA: Diagnosis not present

## 2017-08-19 DIAGNOSIS — F1120 Opioid dependence, uncomplicated: Secondary | ICD-10-CM | POA: Diagnosis not present

## 2017-08-19 DIAGNOSIS — E039 Hypothyroidism, unspecified: Principal | ICD-10-CM

## 2017-08-22 NOTE — Telephone Encounter (Signed)
mychart response sent to pt.

## 2017-08-22 NOTE — Telephone Encounter (Signed)
I am not going to refill  I have never ordered for patient  She will need to see me to discuss why she needs this medication before I will order  thanks

## 2017-08-22 NOTE — Telephone Encounter (Signed)
L/v 01/19/17    Last ordered: 12/09/15 Dr Aurea Graff    Routed to PCP if needs appt prior to ordering this medication.

## 2017-08-30 DIAGNOSIS — E782 Mixed hyperlipidemia: Secondary | ICD-10-CM | POA: Diagnosis not present

## 2017-08-30 DIAGNOSIS — J449 Chronic obstructive pulmonary disease, unspecified: Secondary | ICD-10-CM | POA: Diagnosis not present

## 2017-08-30 DIAGNOSIS — G8929 Other chronic pain: Secondary | ICD-10-CM | POA: Diagnosis not present

## 2017-08-30 DIAGNOSIS — F5101 Primary insomnia: Secondary | ICD-10-CM | POA: Diagnosis not present

## 2017-08-30 DIAGNOSIS — I2510 Atherosclerotic heart disease of native coronary artery without angina pectoris: Secondary | ICD-10-CM | POA: Diagnosis not present

## 2017-08-30 DIAGNOSIS — F17219 Nicotine dependence, cigarettes, with unspecified nicotine-induced disorders: Secondary | ICD-10-CM | POA: Diagnosis not present

## 2017-08-30 DIAGNOSIS — F329 Major depressive disorder, single episode, unspecified: Secondary | ICD-10-CM | POA: Diagnosis not present

## 2017-08-30 DIAGNOSIS — I10 Essential (primary) hypertension: Secondary | ICD-10-CM | POA: Diagnosis not present

## 2017-09-09 ENCOUNTER — Ambulatory Visit (INDEPENDENT_AMBULATORY_CARE_PROVIDER_SITE_OTHER): Payer: Medicare Other | Admitting: Ophthalmology

## 2017-09-09 DIAGNOSIS — H209 Unspecified iridocyclitis: Secondary | ICD-10-CM

## 2017-09-09 DIAGNOSIS — H25041 Posterior subcapsular polar age-related cataract, right eye: Secondary | ICD-10-CM

## 2017-09-09 NOTE — Assessment & Plan Note (Addendum)
Improved, tapered off steroid, on Rituxan for IgG4 and MS disease.  Quiescent for 3 months, if stable in 2-3 months will proceed with ceiol    Amblyopia OD, bcva limited

## 2017-09-09 NOTE — Progress Notes (Signed)
Posterior subcapsular age-related cataract, right eye  Inflammatory much improved OD. Quiescent for 3 months Patient interested in [place]. Plan for 2-3 months if quiet then will schedule.    Uveitis of right eye  Improved, tapered off steroid, on Rituxan for IgG4 and MS disease.  Quiescent for 3 months, if stable in 2-3 months will proceed with ceiol    Amblyopia OD, bcva limited       I have seen and and personally examined the patient. I agree with the note and plan as I have modified above.

## 2017-09-09 NOTE — Assessment & Plan Note (Addendum)
Inflammatory much improved OD. Quiescent for 3 months Patient interested in [place]. Plan for 2-3 months if quiet then will schedule.

## 2017-09-19 DIAGNOSIS — M5090 Cervical disc disorder, unspecified, unspecified cervical region: Secondary | ICD-10-CM | POA: Diagnosis not present

## 2017-09-19 DIAGNOSIS — Z720 Tobacco use: Secondary | ICD-10-CM | POA: Diagnosis not present

## 2017-09-19 DIAGNOSIS — F1120 Opioid dependence, uncomplicated: Secondary | ICD-10-CM | POA: Diagnosis not present

## 2017-09-19 DIAGNOSIS — G894 Chronic pain syndrome: Secondary | ICD-10-CM | POA: Diagnosis not present

## 2017-09-19 DIAGNOSIS — M5136 Other intervertebral disc degeneration, lumbar region: Secondary | ICD-10-CM | POA: Diagnosis not present

## 2017-09-19 DIAGNOSIS — G5600 Carpal tunnel syndrome, unspecified upper limb: Secondary | ICD-10-CM | POA: Diagnosis not present

## 2017-09-19 DIAGNOSIS — M546 Pain in thoracic spine: Secondary | ICD-10-CM | POA: Diagnosis not present

## 2017-09-19 DIAGNOSIS — Z8781 Personal history of (healed) traumatic fracture: Secondary | ICD-10-CM | POA: Diagnosis not present

## 2017-09-19 DIAGNOSIS — Z79891 Long term (current) use of opiate analgesic: Secondary | ICD-10-CM | POA: Diagnosis not present

## 2017-09-21 ENCOUNTER — Encounter (HOSPITAL_BASED_OUTPATIENT_CLINIC_OR_DEPARTMENT_OTHER): Payer: Self-pay | Admitting: Neurology

## 2017-09-21 DIAGNOSIS — M792 Neuralgia and neuritis, unspecified: Principal | ICD-10-CM

## 2017-09-21 DIAGNOSIS — G35 Multiple sclerosis: Secondary | ICD-10-CM

## 2017-09-21 NOTE — Telephone Encounter (Signed)
Last visit in this department 06/17/2017  Next visit in this department 10/19/2017

## 2017-09-21 NOTE — Telephone Encounter (Signed)
From: Debra Bolton  To: Arelia Sneddon, MD  Sent: 09/21/2017 11:19 AM PDT  Subject: 1-Non Urgent Medical Advice    Dear Dr. Rosendo Gros,    Can you please call in a script for baclofen 10mg . 1-3 times a day. Dr. Rana Snare would not renew because she didn't know what it was for. This was a script I had from Dr. Sharion Dove and did not use a lot, but did use at least once a day    at night.   [phone]. Thanks I will see you in July.    Debra Bolton

## 2017-09-22 MED ORDER — BACLOFEN 10 MG OR TABS
10.00 mg | ORAL_TABLET | Freq: Three times a day (TID) | ORAL | 3 refills | Status: DC
Start: 2017-09-22 — End: 2018-01-10

## 2017-10-14 ENCOUNTER — Encounter (INDEPENDENT_AMBULATORY_CARE_PROVIDER_SITE_OTHER): Payer: Self-pay | Admitting: Family Practice

## 2017-10-14 ENCOUNTER — Ambulatory Visit (INDEPENDENT_AMBULATORY_CARE_PROVIDER_SITE_OTHER): Payer: Medicare Other | Admitting: Family Practice

## 2017-10-14 ENCOUNTER — Encounter

## 2017-10-14 VITALS — BP 117/78 | HR 82 | Temp 98.7°F | Ht 64.0 in | Wt 136.9 lb

## 2017-10-14 DIAGNOSIS — G35 Multiple sclerosis: Secondary | ICD-10-CM

## 2017-10-14 DIAGNOSIS — R0602 Shortness of breath: Secondary | ICD-10-CM

## 2017-10-14 DIAGNOSIS — R6882 Decreased libido: Secondary | ICD-10-CM

## 2017-10-14 DIAGNOSIS — Z1331 Encounter for screening for depression: Principal | ICD-10-CM

## 2017-10-14 DIAGNOSIS — E039 Hypothyroidism, unspecified: Secondary | ICD-10-CM

## 2017-10-14 DIAGNOSIS — Z87410 Personal history of cervical dysplasia: Secondary | ICD-10-CM

## 2017-10-14 DIAGNOSIS — Z1239 Encounter for other screening for malignant neoplasm of breast: Secondary | ICD-10-CM

## 2017-10-14 DIAGNOSIS — Z0000 Encounter for general adult medical examination without abnormal findings: Secondary | ICD-10-CM

## 2017-10-14 DIAGNOSIS — Z87891 Personal history of nicotine dependence: Secondary | ICD-10-CM

## 2017-10-14 MED ORDER — MODAFINIL 200 MG OR TABS
200.00 mg | ORAL_TABLET | Freq: Every morning | ORAL | 0 refills | Status: DC
Start: 2017-10-14 — End: 2019-02-08

## 2017-10-14 MED ORDER — ALBUTEROL SULFATE 108 (90 BASE) MCG/ACT IN AERS
2.0000 | INHALATION_SPRAY | Freq: Four times a day (QID) | RESPIRATORY_TRACT | 0 refills | Status: DC | PRN
Start: 2017-10-14 — End: 2017-11-02

## 2017-10-14 MED ORDER — THYROID 15 MG OR TABS
15.0000 mg | ORAL_TABLET | Freq: Every day | ORAL | 0 refills | Status: DC
Start: 2017-10-14 — End: 2017-10-28

## 2017-10-14 NOTE — Progress Notes (Signed)
SUBJECTIVE:  Debra Bolton is a 57 year old female here for physical and for disability forms to be filled out    She is here for physical    She has been feeling more out of breath  She had asthma when she was pregnant  She was a smoker in the past  Yesterday she was walking and felt sob  She felt wheezy  She has not used inhaler in years  No chest pain  No passing out  She last smoked 2 years ago in October  She smoked for 10 years then she lost her son in 2002 she started smoking again 15 years  25 years all together about 1 pack per day      She has forms and paperwork for disability    MS- seeing neurology  She had 2 rounds of rituxan  Saw ophthalmology and neurology for uveitis    She is on provigil and amantadine by neurology as well    She is still having vision issues  She saw opthalmology told not in optic nerve    They wanted to get rid of cataracts but have to wait until inflammation improves  She cannot see out of the eye well , the right eye        Hypothyroid- Taking armour, on 60mg  and feels like her libido is lower; her libido was better on higher dose, has got palpations in the past from higher dose    Colonoscopy done in 2013 at [facility] due 2023-> brought records with her today  H/o cervical dysplasia-  [facility] and laser surgery and went in every 2 months for years and now normal  Last pap h/o of severe dysplasia, moderate to severe dyslaisa 1988  LEEP 2001        Review of Systems -   See HPI    HISTORY:  Patient Active Problem List   Diagnosis    Multiple sclerosis (CMS-HCC)    IgG4 related disease (CMS-HCC)    Uveitis of right eye    Posterior subcapsular age-related cataract, right eye    Mood disorder (CMS-HCC)    PTSD (post-traumatic stress disorder)     Past Medical History:   Diagnosis Date    Asthma 1991    with pregnancy    Hypothyroidism     Major depressive disorder, single episode     MS (multiple sclerosis) (CMS-HCC)      Past Surgical History:    Procedure Laterality Date    leep      for cervical dysplasia    LYMPH NODE BIOPSY      IgG4 related disease    TONSILLECTOMY AND ADENOIDECTOMY      only tonsillectomy       Current Outpatient Medications on File Prior to Visit   Medication Sig Dispense Refill    baclofen (LIORESAL) 10 MG tablet Take 1 tablet (10 mg) by mouth 3 times daily. 90 tablet 3    diazepam (VALIUM) 5 MG tablet Take 1 tablet (5 mg) by mouth once a week.      DULoxetine (CYMBALTA) 60 MG CR capsule Take 2 capsules (120 mg) by mouth daily. 60 capsule 5    gabapentin (NEURONTIN) 300 MG capsule Take 2 capsules (600 mg) by mouth nightly.      modafinil (PROVIGIL) 200 MG tablet Take 1 tablet (200 mg) by mouth every morning. 30 tablet 0    nortriptyline (PAMELOR) 25 MG capsule Take 2 capsules (50 mg)  by mouth nightly. 60 capsule 5     No current facility-administered medications on file prior to visit.      Allergies   Allergen Reactions    Penicillins Rash    Modafinil Diarrhea and Nausea and Vomiting     Family History   Problem Relation Name Age of Onset    Other Mother Zara Chess     Thyroid Mother Zara Chess     Cancer Father don mc clain         lung    Cancer Brother      Cancer Sister Velva Harman mcclain         breast    Diabetes Sister suzanne mcclain     Stroke Brother Don mcclain      Social History     Socioeconomic History    Marital status: Divorced     Spouse name: Not on file    Number of children: Not on file    Years of education: Not on file    Highest education level: Not on file   Occupational History    Not on file   Social Needs    Financial resource strain: Not on file    Food insecurity:     Worry: Not on file     Inability: Not on file    Transportation needs:     Medical: Not on file     Non-medical: Not on file   Tobacco Use    Smoking status: Former Smoker     Packs/day: 1.00     Years: 26.00     Pack years: 26.00     Types: Cigarettes     Last attempt to quit: 02/01/2016     Years since  quitting: 1.7    Smokeless tobacco: Never Used   Substance and Sexual Activity    Alcohol use: Yes     Alcohol/week: 10.0 oz     Types: 4 Glasses of wine per week    Drug use: Not on file    Sexual activity: Yes     Partners: Male   Lifestyle    Physical activity:     Days per week: Not on file     Minutes per session: Not on file    Stress: Not on file   Relationships    Social connections:     Talks on phone: Not on file     Gets together: Not on file     Attends religious service: Not on file     Active member of club or organization: Not on file     Attends meetings of clubs or organizations: Not on file     Relationship status: Not on file    Intimate partner violence:     Fear of current or ex partner: Not on file     Emotionally abused: Not on file     Physically abused: Not on file     Forced sexual activity: Not on file   Other Topics Concern    Military Service Not Asked    Blood Transfusions Not Asked    Caffeine Concern Not Asked    Occupational Exposure Not Asked    Hobby Hazards Not Asked    Sleep Concern Not Asked    Stress Concern Not Asked    Weight Concern Not Asked    Special Diet Not Asked    Back Care Not Asked    Exercise Not Asked    Bike Helmet Not  Asked    Seat Belt Not Asked    Self-Exams Not Asked   Social History Narrative    Moved from [place] is great, high fiber, she is very conscientious of effect of diet on health.    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    Hasn't had a cigarette in 8 months.    Has smoked 26 years, less than a pack a day.    Currently drinks 2-4 drinks a day on the weekends, total 4-8 per week.    Sexually active with one female partner.    Currently not working, has been on disability for most of the time from 1992 to now.       OBJECTIVE:    BP 117/78    Pulse 82    Temp 98.7 F (37.1 C) (Oral)    Ht 5\' 4"  (1.626 m)    Wt 62.1 kg (136 lb 14.5 oz)    SpO2 97%    BMI 23.50 kg/m     Body mass index is 23.5 kg/m.  Wt Readings from Last 5  Encounters:   10/19/17 61.7 kg (136 lb)   10/14/17 62.1 kg (136 lb 14.5 oz)   07/19/17 62.8 kg (138 lb 8 oz)   07/06/17 64 kg (141 lb)   07/01/17 65.6 kg (144 lb 9.6 oz)     Blood Pressure   10/19/17 128/70   10/14/17 117/78   07/19/17 119/80   07/06/17 112/79   07/01/17 109/66       General: WDWN, NAD, answers appropriately  Eyes: anicteric, PERRL, EOMI  HENT:moist oral mucosa, no oral lesions  Heart: RRR, no MRG  Lungs: normal effort, CTAB, no wheezes, rhonchi, crackles  Psych: alert and oriented x 3, appropriate affect    Studies:  01/23/2017 3:35 PM - Special educational needs teacher, Epic     Narrative     SPECIMENS SUBMITTED:  Cervical (Pap) Smear;Thin-Prep Vial Received  CLINICAL INFORMATION:  Postmenopausal; Other Clinical Findings: Screening  FINAL CYTOLOGIC INTERPRETATION:  No Atypical or Malignant Cells       01/27/2017 10:43 AM - Electronic Interface To Epic, Softlab Lab Results         Component Value Flag Ref Range Units Status Lab   HPV High Risk Genotype 16 Not Detected   See Comment  Final CALM   HPV High Risk Genotype 18 Not Detected   See Comment  Final CALM   HPV Other High Risk Genotypes (Not type 16 or 18) Not Detected   See Comment  Final CALM   Comment:   The Roche HPV High Risk Genotypes Nucleic Acid test detects   high risk HPV genotypes (16,18,31,33,35,39,45,51,52,56,58,   59,66,and 68), specifically differentiates genotypes 16 and   18 from other high risk genotypes, and is capable of detecting   mixed infections involving multiple HPV high risk genotypes.   This FDA-cleared in vitro diagnostic test has been modified   and its performance characteristics have been validated by the   [facility].                                .   Expected result: Not Detected          EXAM DESCRIPTION:  MRI BRAIN WO/W CONTRAST on 08/10/2017    CLINICAL HISTORY:  Multiple sclerosis. Clinically stable.    TECHNIQUE:  MRI imaging of the brain  was  performed on a 1.5 Tesla GE magnet system with sequences including: Axial diffusion, T2, FLAIR, SWAN with minimum intensity reproductions; coronal T2. Sagittal FSPGR, CUBE FLAIR with axial and coronal reformats.    There was administration of 7 mL Gadavist intravenous contrast    COMPARISON:  MRI brain 07/16/2016, 06/21/2015    FINDINGS:  Redemonstration of multiple foci of subcentimeter T2/FLAIR white matter hyperintensities involving the periventricular and juxtacortical white matter of the supratentorial. Additional similar subcentimeter foci are seen in the bilateral paracentral and right dorsolateral aspect of the pons.    No new lesions. No restricted diffusion or enhancing lesions.    No acute or subacute infarct. No MR evidence of subacute or chronic intracranial hemorrhage.    The flow voids corresponding to the major skullbase arteries and dural venous sinuses are preserved. The visible paranasal sinuses are well aerated. The mastoid air cells are clear.    No focal lesions of the calvarium, skullbase or regional soft tissues.    CONCURRENT SUPERVISION:  I have reviewed the images and agree with the [place] interpretation.      I have reviewed the images and agree with the resident interpretation.        Preliminary created by: Andre Lefort   Signed by: Rogelio Seen 08/11/2017 16:25:56   Impression     IMPRESSION:  1. Compared to MRI brain 07/16/2016, no significant change of multifocal subcentimeter T2/FLAIR white matter hyperintensities scattered throughout the supratentorial white matter, and to a lesser extent the pontine white matter, compatible with given history of multiple sclerosis. No new lesions. No restricted diffusion or enhancing lesions to suggest active demyelination.    2. No acute intracranial findings.     ASSESMENT AND PLAN:       ICD-10-CM ICD-9-CM    1. Screened negative for significant depression (PHQ9 of 2-4) Z13.31 V79.0    2. Screening for breast cancer Z12.31 V76.10  Screening Mammogram With Digital Breast Tomosynthesis - Bilateral   3. SOB (shortness of breath) on exertion R06.02 786.05 albuterol 108 (90 Base) MCG/ACT inhaler      Spirometry      X-Ray Chest Frontal And Lateral   4. Hypothyroidism, unspecified type- check TSH E03.9 244.9 TSH, Blood - See Instructions      DISCONTINUED: thyroid (ARMOUR) 15 MG tablet   5. Preventative health care  Colonoscopy due 2023  Pap negative 2018, due 2023, needs to get mammogram Z00.00 V70.0 Lipid Panel Green Plasma Separator Tube   6. Multiple sclerosis (CMS-HCC)- followed by neurology G35 340 modafinil (PROVIGIL) 200 MG tablet   7. Decreased libido- try slightly higher dose of armour but if has palpitations will need to go back to 60mg   R68.82 799.81    8. History of cervical dysplasia Z87.410 V13.22        F/U: Return in about 4 weeks (around 11/11/2017) for f/u breathing sob.     Medication Management:  Medications reviewed with patient and medication list reconciled.  Over the counter medications, herbal therapies and supplements reviewed.  Patient's understanding and response to medications assessed.    Barriers to medications assessed and addressed.  Risks, benefits, alternatives to medications reviewed.    Randa Ngo, MD  Department of Family Medicine and [place], [place]  HS Assistant Clinical Professor, [employer]

## 2017-10-14 NOTE — Patient Instructions (Signed)
Please get your labs and chest xray and also lung function testing  I have filled out your paper work  Please use the inhaler to see if it helps your breathing  See me in 4 weeks for follow up

## 2017-10-16 ENCOUNTER — Encounter (INDEPENDENT_AMBULATORY_CARE_PROVIDER_SITE_OTHER): Payer: Self-pay | Admitting: Family Practice

## 2017-10-18 DIAGNOSIS — G894 Chronic pain syndrome: Secondary | ICD-10-CM | POA: Diagnosis not present

## 2017-10-18 DIAGNOSIS — Z79891 Long term (current) use of opiate analgesic: Secondary | ICD-10-CM | POA: Diagnosis not present

## 2017-10-18 DIAGNOSIS — Z8781 Personal history of (healed) traumatic fracture: Secondary | ICD-10-CM | POA: Diagnosis not present

## 2017-10-18 DIAGNOSIS — M5136 Other intervertebral disc degeneration, lumbar region: Secondary | ICD-10-CM | POA: Diagnosis not present

## 2017-10-18 DIAGNOSIS — G5600 Carpal tunnel syndrome, unspecified upper limb: Secondary | ICD-10-CM | POA: Diagnosis not present

## 2017-10-18 DIAGNOSIS — Z720 Tobacco use: Secondary | ICD-10-CM | POA: Diagnosis not present

## 2017-10-18 DIAGNOSIS — M5090 Cervical disc disorder, unspecified, unspecified cervical region: Secondary | ICD-10-CM | POA: Diagnosis not present

## 2017-10-18 DIAGNOSIS — F1120 Opioid dependence, uncomplicated: Secondary | ICD-10-CM | POA: Diagnosis not present

## 2017-10-18 DIAGNOSIS — M546 Pain in thoracic spine: Secondary | ICD-10-CM | POA: Diagnosis not present

## 2017-10-19 ENCOUNTER — Encounter (HOSPITAL_BASED_OUTPATIENT_CLINIC_OR_DEPARTMENT_OTHER): Payer: Self-pay | Admitting: Neurology

## 2017-10-19 ENCOUNTER — Ambulatory Visit: Payer: Medicare Other | Attending: Neurology | Admitting: Neurology

## 2017-10-19 ENCOUNTER — Other Ambulatory Visit (HOSPITAL_BASED_OUTPATIENT_CLINIC_OR_DEPARTMENT_OTHER): Payer: Medicare Other

## 2017-10-19 VITALS — BP 128/70 | HR 97 | Resp 16 | Ht 64.0 in | Wt 136.0 lb

## 2017-10-19 DIAGNOSIS — R5383 Other fatigue: Secondary | ICD-10-CM | POA: Insufficient documentation

## 2017-10-19 DIAGNOSIS — H209 Unspecified iridocyclitis: Secondary | ICD-10-CM

## 2017-10-19 DIAGNOSIS — G35 Multiple sclerosis: Principal | ICD-10-CM | POA: Insufficient documentation

## 2017-10-19 DIAGNOSIS — Z0184 Encounter for antibody response examination: Secondary | ICD-10-CM

## 2017-10-19 DIAGNOSIS — D8989 Other specified disorders involving the immune mechanism, not elsewhere classified: Secondary | ICD-10-CM | POA: Insufficient documentation

## 2017-10-19 DIAGNOSIS — E039 Hypothyroidism, unspecified: Secondary | ICD-10-CM

## 2017-10-19 LAB — TSH, BLOOD: TSH: 0.33 u[IU]/mL (ref 0.27–4.20)

## 2017-10-19 MED ORDER — AMANTADINE HCL 100 MG OR TABS
100.00 mg | ORAL_TABLET | Freq: Two times a day (BID) | ORAL | 0 refills | Status: DC
Start: 2017-10-19 — End: 2020-04-02

## 2017-10-19 NOTE — Progress Notes (Signed)
Neurology Clinic Pharmacy Note:    Date:  10/19/17   S: Debra Bolton is a 57 year old female here for f/u.       At last visit, pt had been on Tysabri - this was held and 2 infusions of rituximab were given 07/01/2017 and 07/19/2017.     Plan at that visit:   - Rituximab 500 mg X 2 infusions once cleared - Stop tysabri after rituximab approved . We may continue off tysabri or restart in 3-4 months depending on response to rituximab  -  Take amantadine 100 mg in the morning and afternoon and the provigil just 100 mg in morning; okay to increase ot 200 mg as needed    Pt had communicated w/ PCP via MyChart whether or not she could get an MMR booster while taking Rituxan - I would not recommend this as it is a live vaccine.        Medication reconciliation: Medication reconciliation was not  performed     Per psych NP note: Pt restarted nortriptyline 5m daily with beneficial effect on pain, reduced gabapentin back to 6049mqHS.  Has not restarted tizanidine, continues on duloxetine 6024maily.         Narcotics and who prescribes: diazepam on med list per chart review, however no fills per [facility] within the last year.       Insurance coverage: Medicare/IEHP  Specialty pharmacy: N/A    Orders/prescriptions up to date/adequate refills:   May require: tizanidine (per other provider's note, not taking)  May also be out of duloxetine, nortriptyline (though these are for mood managed by another provider)    Drug specific safety monitoring:       Ref. Range 06/17/2017 15:57   HBcAb Total Unknown Non Reactive   Quantiferon TB Latest Ref Range: See Comment  Negative   TB Antigen - Nil Latest Units: [IU]/mL 0.054           Vaccinations:  Flu annually  Prevnar (first)  Pneumovax (6 months later)    Current Immunizations  Never Reviewed    Name Date    Tdap 01/19/2017          Assessment/Plan:  1. Neurology: per MD Kinkel  2. Recommended labs: CD19 per MD Kinkel to determine plan for next rituximab infusion  3. Recommended  vaccinations: Prevnar, Pneumovax.   Would not recommend live vaccines.     MelBraulio BoschharmD, [facility] Clinical Pharmacist

## 2017-10-19 NOTE — Progress Notes (Signed)
[facility]    First visit: 12/09/2015     Last visit: 06/17/17 Current visit: 10/19/17    Consultation Source: seen at [facility], Lavon Paganini and Christy Gentles, [email or web address] [phone]    Principle Neurological Diagnosis:   1. Clinically definite Multiple Sclerosis, relapsing form  2. Anterior uveitis  3. IgG4 related lymphadenopathy    Narrative History for Current Visit: Debra Bolton is a 57 year old woman living in [place] for the past 2 years, but originally from [place], seen for 27 years by Dr. Lavon Paganini at [facility] [place]. 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  We reviewed the history and problem list in detail today which is documented in the following sections.     She tolerated the RItuximab well (07/01/17 and 07/19/17); Initially noticed improvement in hoarse voice (related to swollen cervical LNs from IgG4 syndrome) but now returning  Dr Truman Hayward fell uveitis OD better on 09/09/17 but she feels inflammation is returning. Dr Truman Hayward is waiting several months to make sure uveitis resolved before doing cataract surgery    Mouth ulcers continue to occur intermittently (sore raised white spot without bleeding but painful). Feels extremely fatigued   _____________________________________________________________________    MS Problem List:   1. Mobility (ND;range 0-6):none  2. Hand Function (ND;range 0-5):right hand clumsiness  3. Vision (ND; range 0-5):Decreased vision OD>OS  4. Fatigue ( ND; range 0-5): provigil 200 mg in morning prn only since it makes her "shaky"  5. Cognition ( ND; range 0-5):episodes of word finding difficulties within past several years  6. Bowel/Bladder ( ND; range 0-5): rare bowel incontinence starting 10 years ago, infrequent urinary retention   7. Sensory ( ND; range 0-5): bilateral leg tingling and pain (right > left), rarely with arm tingling (right  >>left)  8. Spasticity ( ND; range 0-5): bilateral legs, improved with yoga and now graduating as an Art therapist  **9. Pain ( ND; range 0-5): lower extremity tingling and burning pain; currently on 600 mg qhs of gabapetin and cymbalta 60 mg qhs    MS Performance Scale Score (sum of problem list scores 1-9):  ND  (result ranges from 0 (normal) to 46 (maximal interference of symptoms on life))    10. Affective: (BDI II:  ND) longstanding history of depression  11. Social Support: lives with boyfriend, 3 daughters none present in [place]. Other symptoms or problems:  None  ______________________________________________________________________    MS History      Initial Symptoms (Date and Description): 1987 (~age 60), after delivery of first daughter, both legs would fall asleep during exercise  1988 grand mal seizure in doctor office  1989 another grand mal seizure in [place] while on vacation, was placed on tegretol for 2 years  1991 diagnosed with MS as she had more frequent episodes of fatigue, depression and numbness     Second Attack (Date and Description):     Subsequent relapses and course:    Relapses in Past 2 years: see above     Progressive Disease (approx onset and description): see above      Disease Modifying Treatment History: [Documented Needle phobia: No]    1. Betaseron (~ 1 year on therapy)  2. Copaxone ( <1 year) discontinued because it worsened depression  3. Tysabri (~0981 - Sep 11, 2015)  4. Rituximab 500 mg X 2 doses (07/01/17 & 07/19/17)  ______________________________________________________________________    Review of Systems: Complete 10 point review of symptoms from the patient self report questionnaire was reviewed with the patient today and scanned into the media tab. Any pertinent positives are listed above in the narrative history or problem list.    Allergies   Allergen Reactions    Penicillins Rash    Modafinil Diarrhea and Nausea and Vomiting       Current Outpatient  Medications   Medication Sig    albuterol 108 (90 Base) MCG/ACT inhaler Inhale 2 puffs by mouth every 6 hours as needed for Wheezing.    Amantadine HCl 100 MG tablet Take 100 mg by mouth 2 times daily.    baclofen (LIORESAL) 10 MG tablet Take 1 tablet (10 mg) by mouth 3 times daily.    diazepam (VALIUM) 5 MG tablet Take 1 tablet (5 mg) by mouth once a week.    DULoxetine (CYMBALTA) 60 MG CR capsule Take 2 capsules (120 mg) by mouth daily.    gabapentin (NEURONTIN) 300 MG capsule Take 2 capsules (600 mg) by mouth nightly.    modafinil (PROVIGIL) 200 MG tablet Take 1 tablet (200 mg) by mouth every morning.    nortriptyline (PAMELOR) 25 MG capsule Take 2 capsules (50 mg) by mouth nightly.    thyroid (ARMOUR THYROID) 60 MG tablet Take 1 tablet (60 mg) by mouth daily.    thyroid (ARMOUR) 15 MG tablet Take 1 tablet (15 mg) by mouth daily.    tiZANidine (ZANAFLEX) 4 MG tablet Take 1.5 tablets (6 mg) by mouth at bedtime.     No current facility-administered medications for this visit.        Past Medical History:   Diagnosis Date    Asthma 1991    with pregnancy    Hypothyroidism     Major depressive disorder, single episode     MS (multiple sclerosis) (CMS-HCC)      Past Surgical History:   Procedure Laterality Date    leep      for cervical dysplasia    LYMPH NODE BIOPSY      IgG4 related disease    TONSILLECTOMY AND ADENOIDECTOMY      only tonsillectomy       Family Medical History:  Family History   Problem Relation Name Age of Onset    Other Mother Zara Chess     Thyroid Mother Zara Chess     Cancer Father don mc clain         lung    Cancer Brother      Cancer Sister Velva Harman mcclain         breast    Diabetes Sister suzanne mcclain     Stroke Brother Don mcclain      ______________________________________________________________________    Examination:   BP 128/70 (BP Location: Left arm, BP Patient Position: Sitting, BP cuff size: Regular)    Pulse 97    Resp 16    Ht 5' 4"  (1.626 m)    Wt  61.7 kg (136 lb)    SpO2 98%    BMI 23.34 kg/m   General: Examination of the skin, joints and extremities revealed no abnormalities  ;  There were no cervical, ocular or cranial bruits.     Cognitive/Behavioral: Examination of cognition, language and prosody revealed no abnormalities . Behavioral and affect was appropriate.    Cranial Nerves: Near visual acuity wasJ10 OD and J2 OS with PH (previously 20/400 OD and J7 OS without correction).Visual fields  were full  to confrontation testing. Lids were normal .  Pupils were 34m  and reactive with no  RAPD . Fundoscopic exam revealed no definite optic nerve pallor. Eyes were orthotropic with bilateral rotary nystagmus and left end point nystagmus.  Pursuit and saccadic eye movements revealed few beats of lateral nystagmus in both directions. Facial movements revealed no abnormalities. Testing of facial sensation revealed no abnormalities. Hearing was normal  AD and normal  AS. Bulbar examination revealed no abnormalities .     Motor: Upper extremity strength was as follows (R/L): Shoulder abduction 5/5, Elbow extension 5/5, Wrist extension 5/5, Hand instrinsics 5/5. Rapid alternating movements were graded 1/1+. Spasticity ([place]) was rated 1/1. Lower extremity strength was as follows (R/L): Hip Flexors 5/5, Knee Flexors 5/5, Ankle dorsiflexors 5/5; Toe tapping was rating 1/1; Spasticity was rated 1+/1  There was no atrophy. There were no fasiculations.    Cerebellar: Finger to nose testing revealed no abnormalities  in the RUE and no abnormalities  in the LUE. Heel to shin testing revealed no abnormalities  in the RLE and no abnormalities  in the LLE. (see gait for description of midline/axial cerebellum dysfunction)    Sensory: Sensation to pp and temperature was normal. Vibration sensation duration (secs) was (R/L): Upper extremity middle finger ND/ND; Lower extremity big toe   Joint position sensation was normal .      Reflexes: (R/L): Biceps 2+/2+, BR 2+/2+,  Triceps 2+/2+, Patella 2+/2+, Ankle 2/2. There was no clonus .     Gait Description: normal gait and station.  Ambulation was performed safely without  assistance. The patient was able to  tandem walk 8 steps without faltering.   ----------------------------------------------------------------------------------------------------------------------  Performance Measures:  9-Hole PEG Test 06/17/2017   RUE  21.5   RUE (Best) 19.5   LUE 22.7   LUE (Best) 19.6       25 Ft. Ambulation Time 06/17/2017   25 ft Ambulation Time - 1st Trial (Seconds) 4.5   Independently or With Assistance? Independently   25 ft Ambulation Time - 2nd Trial (Seconds) 4.5   Independently or With Assistance? Independently       No flowsheet data found.    No flowsheet data found.    No flowsheet data found.    No flowsheet data found.    ----------------------------------------------------------------------------------------------------------------------    Review of Imaging Studies:  1. MRI brain 08/10/17 reviewed and compared to prior MRI dated 07/16/16: [facility]assic MS; New T2=3 (small discrete and all in DWM); relatively low burden. No gad lesions. Mild atrophy for age    Review of Labs:  1. CSF reportedly with oligoclonal bands  JC virus 0.24  (09/11/15), 0.20 (01/02/16)    Assessment:   1.  Clinically definite Multiple Sclerosis, relapsing form, Stable clinically. New baseline MRI without activity but New T2 lesions since last study  07/16/16    Atypical features (red flags) include: seizure at onset  Individual risk factors for disease activity and worsening disability include: None known but no imaging studies available    2.  Neuropathic pain: better on current regimen    3.  Anxiety and depression: better    4. Fatigue: still problematic. Sleeps well and depression well controlled. No obvious medication causes and hypothyroidism well controlled. Advised her to add prn amantadine 100 mg in am and afternoon on bad days as well as modafinil 200 mg  qd    5. 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 Still needs cataract surgery OD once uveitis resolved    6. IgG 4 related lymphadenopathy; isolated to cervical lymph notes; stable symptomatically     After discussing the options available and the potential benefits (pros) and risks (cons), we mutually decided on the following management plan    Plan:  1. CD19 counts every 2 months starting today; will likely retreat with rituximab in September  2. Return in November 2019        Total time spent with the patient today was 40 minutes with over 50% of the time spent on education and counseling regarding their problem list and the assessment and plan

## 2017-10-19 NOTE — Patient Instructions (Addendum)
1. CD19 count today and every 2 months (today, September and November): contact us for results and whether you need your next infusion in September 2019    2. Return in November

## 2017-10-20 LAB — TOTAL B CELL (CD19), BLOOD
CD19 B-Cell %: <1 % — ABNORMAL LOW (ref 6–23)
CD19 B-Cell Abs: <25 cells/uL — ABNORMAL LOW (ref 50–350)

## 2017-10-20 LAB — RUBELLA IGG ANTIBODY, BLOOD: Rubella IGG Ab: POSITIVE

## 2017-10-20 LAB — MEASLES IGG ANTIBODY, BLOOD: Measles IGG Ab: POSITIVE

## 2017-10-20 LAB — MUMPS IGG ANTIBODY, BLOOD: Mumps IGG Ab: POSITIVE

## 2017-10-22 ENCOUNTER — Encounter (INDEPENDENT_AMBULATORY_CARE_PROVIDER_SITE_OTHER): Payer: Self-pay | Admitting: Family Practice

## 2017-10-24 ENCOUNTER — Telehealth (INDEPENDENT_AMBULATORY_CARE_PROVIDER_SITE_OTHER): Payer: Self-pay | Admitting: Family Practice

## 2017-10-24 NOTE — Telephone Encounter (Signed)
No answer, left voice mail to call back to speak to a triage nurse; triage phone number included.   Mychart sent    Triage RNs to continue to follow

## 2017-10-24 NOTE — Telephone Encounter (Signed)
Provider action requested:  Yes, please review and advise  Pt lives 1.5 hrs away, went to [facility] ER 7/7 near her  Had neuro exam per pt was neg.-no imaging done    If needs to go back to ER will only have a ride tomorrow morning    Date: 10/24/2017   Time: 1:29 PM   Name of PCP Provider: Edi, [place] S     Patient name: Debra Bolton 57 year old   Verified DOB  Pt advised all calls are recorded for quality assurance.      Spoke to pt    " Dear Dr. Rana Snare,   Hate to bother you but I slipped in the tub and hit my head on the toilet fairly hard on the 4th.   I have been taking it easy and laying low. I do not have nausea or dizziness but just wanted to check if that is enough. My head is sore but I did not lose consciousness.   Am I good   Debra Bolton "    Reason for call/CC: pt was at a hotel in [facility] and slipped in bath tub and on toilet seat-(slammed left side of head because flipped)-feels a little cloudy minded, A/O x 4  Problem: soreness, inflammation    Onset: 7/4  Associated symptoms: none  Does anything make it worse? Not on blood thinners   Have you tried anything to relieve your symptoms? Taking tylenol and icing head  Was checked in [facility] ER 7/7    Advice: go to ER if has sxs    Pertinent PMHx:   Patient Active Problem List   Diagnosis    Multiple sclerosis (CMS-HCC)    IgG4 related disease (CMS-HCC)    Uveitis of right eye    Posterior subcapsular age-related cataract, right eye    Mood disorder (CMS-HCC)    PTSD (post-traumatic stress disorder)       DENIES: fever, chills, nausea or vomiting, weakness, dizziness, diff breathing, diff swallowing, SOB, chest pain, diarrhea, back/flank pain, or pain/pressure in lower abdomen or pelvic area.     Future Appointments   Date Time Provider [place]   11/07/2017 12:45 PM Waylan Rocher, MD [facility]   11/18/2017 11:40 AM Edi, Verl Blalock, MD GEN [facility]   04/27/2018 10:00 AM Arelia Sneddon, MD MON [facility]     ER Precautions reviewed:      Call 911 or have someone else call for any of the following:   You cannot be woken.    You have a seizure.    You stop responding to others or you faint.    You have blurry or double vision.    Your speech becomes slurred or confused.    You have arm or leg weakness, loss of feeling, or new problems with coordination.    Your pupils are larger than usual or one pupil is a different size than the other.     You have blood or clear fluid coming out of your ears or nose.    Chest pain, sob, severe headache, numbness or weakness on one side, feeling faint/dizziness, confusion, slurred speech.     Patient verbalizes understanding and agrees with plan of care.  RN Encouraged patient to call back PRN or if symptoms worsen or persist.   RN will forward information to Dr. Rana Snare, Rina S.

## 2017-10-24 NOTE — Telephone Encounter (Signed)
Will close to avoid duplicate encounters. Sent to triage to assist.

## 2017-10-24 NOTE — Telephone Encounter (Signed)
Symptom Call          Next office visit:  11/18/2017  List the date of PCP first available: NA  Did you offer Express Care/Urgent Care: No    What symptom is the patient experiencing? Felled and hit head      Name of PCP Provider: Edi, Rina S   Insurance Coverage Verified: Active- in network  Last office visit: 10/14/2017    Who is reporting the symptoms? Incoming call from patient    Is this a new or ongoing symptom? ongoing  Estimated time since experiencing symptom(s)? 4 days    Best way to contact patient:  [phone]      P CARE NAV TRIAGE POOL [ [id number] ]      *Select a method of escalation for this call:     1. *Warm transfer to [place] RN at extension [phone]

## 2017-10-24 NOTE — Telephone Encounter (Signed)
From: Debra Bolton  To: Edi, Verl Blalock, MD  Sent: 10/22/2017 8:29 PM PDT  Subject: 1-Non Urgent Medical Advice    Dear Dr. Rana Snare,   Hate to bother you but I slipped in the tub and hit my head on the toilet fairly hard on the 4th.  I have been taking it easy and laying low. I do not have nausea or dizziness but just wanted to check if that is enough. My head is sore but I did not lose consciousness.  Am I good  Debra Bolton

## 2017-10-24 NOTE — Telephone Encounter (Signed)
Copied from patient's mychart;    Dear Dr. Rana Snare,   Hate to bother you but I slipped in the tub and hit my head on the toilet fairly hard on the 4th.   I have been taking it easy and laying low. I do not have nausea or dizziness but just wanted to check if that is enough. My head is sore but I did not lose consciousness.   Am I good   Debra Bolton

## 2017-10-25 ENCOUNTER — Other Ambulatory Visit (INDEPENDENT_AMBULATORY_CARE_PROVIDER_SITE_OTHER): Payer: Self-pay | Admitting: Family Practice

## 2017-10-25 DIAGNOSIS — E039 Hypothyroidism, unspecified: Secondary | ICD-10-CM

## 2017-10-25 NOTE — Telephone Encounter (Signed)
If she went to the ER and was evaluated that is reassuring.  If she has worsening headache, vomiting, or any neurologic changes she has to go to the ER.  She should follow up though if she continues to have headache  Thanks

## 2017-10-26 MED ORDER — THYROID 60 MG OR TABS
60.00 mg | ORAL_TABLET | Freq: Every day | ORAL | 1 refills | Status: DC
Start: 2017-10-26 — End: 2017-12-08

## 2017-10-26 NOTE — Telephone Encounter (Addendum)
Melisa Sharen Counter, CPhT  ([facility])            Hypothyroid Medication Refill Protocol    Last visit: 10/14/2017 physical    Last pertinent visit: 10/14/17  Next f/u appt due:  4 weeks (around 11/11/2017) for f/u breathing sob  Next scheduled appointment: 11/18/2017 f/u xrays and labs; Sees other specialties         Per OV 10/14/17:  SUBJECTIVE:  Debra Bolton is a 57 year old female here for physical and for disability forms to be filled out  Thyroid was from [employer] [facility] [employer]  They went from 60 to 66 on 75 she was getting racy  60 seems fine  Her libido is low  He had higher libido when her thyroid was higher  She was a little anxious  Go back 75 recheck in a few months  Colonoscopy done in 2013 at [place] due 2023-> brought records with her today  ASSESMENT AND PLAN:       Hypothyroidism, unspecified type E03.9 244.9 thyroid (ARMOUR) 15 MG tablet      TSH, Blood - See Instructions   Return in about 4 weeks (around 11/11/2017) for f/u breathing sob.        LABS required:  (Q year TSH)  *If recent dose change: check TSH 2 months after starting new  dose.    Lab Results   Component Value Date    TSH 0.33 10/19/2017    FREET4 0.86 (L) 08/10/2017         Monitoring required:  (Q year BP, HR, Wt)   *If pt is pregnant, DEFER TO MD*    (BP range: DSWVTVNR=04-136  CBIPJRPZP=68-86)  Blood Pressure   10/19/17 128/70   10/14/17 117/78   07/19/17 119/80       (HR range: 55-110)   Pulse Readings from Last 3 Encounters:   10/19/17 97   10/14/17 82   07/19/17 89       Wt Readings from Last 3 Encounters:   10/19/17 61.7 kg (136 lb)   10/14/17 62.1 kg (136 lb 14.5 oz)   07/19/17 62.8 kg (138 lb 8 oz)       -----------------------------------------------------------------------------------------------------------    The above technician note was evaluated by the pharmacist.  Pharmacist Assessment and Plan:     Pt completed labs too soon, will auth suff qty to nxt f/u appt with updated labs at that  time    Ambrose Pancoast, PharmD [facility] [place]  Phone:  [phone]  Ext:  [phone]

## 2017-10-26 NOTE — Telephone Encounter (Signed)
RED FLAG CALL (Transfer to the [employer] when pt calls back)   Called patient, no answer.  Left a message to call back and speak with any available triage RN.  Triage phone number included.    Mychart message was sent on 7/8.    Bronson Ing, RN

## 2017-10-28 ENCOUNTER — Other Ambulatory Visit (INDEPENDENT_AMBULATORY_CARE_PROVIDER_SITE_OTHER): Payer: Self-pay | Admitting: Family Practice

## 2017-10-28 DIAGNOSIS — E039 Hypothyroidism, unspecified: Principal | ICD-10-CM

## 2017-10-28 MED ORDER — THYROID 15 MG OR TABS
15.0000 mg | ORAL_TABLET | Freq: Every day | ORAL | 0 refills | Status: DC
Start: 2017-10-28 — End: 2017-12-05

## 2017-10-28 NOTE — Telephone Encounter (Signed)
Melisa Sharen Counter, CPhT  ([facility])          90 DAY REQUEST      Per refill request for 60 mg tabs on 10/26/17:          Phoebe Perch, CPhT  ([facility])      Hypothyroid Medication Refill Protocol    Last visit: 10/14/2017 physical    Last pertinent visit: 10/14/17  Next f/u appt due:  4 weeks (around 11/11/2017) for f/u breathing sob  Next scheduled appointment: 11/18/2017 f/u xrays and labs; Sees other specialties       Per OV 10/14/17:  SUBJECTIVE:  Debra Bolton is a67 year oldfemale here forphysical and for disability forms to be filled out  Thyroid was from [employer] [facility] [employer]  They went from 80 to 61 on 75 she was getting racy  60 seems fine  Her libido is low  He had higher libido when her thyroid was higher  She was a little anxious  Go back 75 recheck in a few months  Colonoscopy done in 2013 at [place] due 2023-> brought records with her today  ASSESMENT AND PLAN:      Hypothyroidism, unspecified type E03.9 244.9 thyroid (ARMOUR) 15 MG tablet      TSH, Blood - See Instructions   Return in about 4 weeks (around 11/11/2017) for f/u breathing sob.        LABS required:  (Q year TSH)  *If recent dose change: check TSH 2 months after starting new  dose.    Lab Results   Component Value Date    TSH 0.33 10/19/2017    FREET4 0.86 (L) 08/10/2017         Monitoring required:  (Q year BP, HR, Wt)   *If pt is pregnant, DEFER TO MD*    (BP range: UMPNTIRW=43-154  MGQQPYPPJ=09-32)  Blood Pressure   10/19/17 128/70   10/14/17 117/78   07/19/17 119/80       (HR range: 55-110)   Pulse Readings from Last 3 Encounters:   10/19/17 97   10/14/17 82   07/19/17 89       Wt Readings from Last 3 Encounters:   10/19/17 61.7 kg (136 lb)   10/14/17 62.1 kg (136 lb 14.5 oz)   07/19/17 62.8 kg (138 lb 8 oz)

## 2017-11-02 ENCOUNTER — Other Ambulatory Visit (INDEPENDENT_AMBULATORY_CARE_PROVIDER_SITE_OTHER): Payer: Self-pay | Admitting: Family Practice

## 2017-11-02 DIAGNOSIS — R0602 Shortness of breath: Secondary | ICD-10-CM

## 2017-11-02 NOTE — Telephone Encounter (Signed)
Melisa Sharen Counter, CPhT  ([facility])          Bronchodilator Refill Protocol    Last visit: 10/14/2017 physical    Last pertinent visit: 10/14/17  Next f/u appt due:  4 weeks (around 11/11/2017) for f/u breathing sob.  Next scheduled appointment: 11/18/2017 f/u xrays and labs; Sees other specialties       Per OV 10/14/17:  SUBJECTIVE:  Debra Bolton is a 57 year old female here for physical   She has been feeling more out of breath  She had asthma when she wa spregnant  She was a smoker in the past  Yesterday she was walking and felt sob  She felt wheezy  She has not used inhaler in years  ASSESMENT AND PLAN:   SOB (shortness of breath) on exertion R06.02 786.05 albuterol 108 (90 Base) MCG/ACT inhaler       Spirometry      X-Ray Chest Frontal And Lateral   Return in about 4 weeks (around 11/11/2017) for f/u breathing sob.  Patient instructions:  Please get your labs and chest xray and also lung function testing  Please use the inhaler to see if it helps your breathing          LABS required:  (None)    Monitoring required:  (None)

## 2017-11-04 ENCOUNTER — Other Ambulatory Visit (INDEPENDENT_AMBULATORY_CARE_PROVIDER_SITE_OTHER): Payer: Self-pay | Admitting: Psychiatric/Mental Health

## 2017-11-04 DIAGNOSIS — R252 Cramp and spasm: Secondary | ICD-10-CM

## 2017-11-04 MED ORDER — TIZANIDINE HCL 4 MG OR TABS
6.0000 mg | ORAL_TABLET | Freq: Every evening | ORAL | 2 refills | Status: DC
Start: 2017-11-04 — End: 2018-03-17

## 2017-11-04 NOTE — Telephone Encounter (Signed)
Script for Adderall sent to pharmacy per patient request.

## 2017-11-04 NOTE — Telephone Encounter (Signed)
Left message for pt if they call back please confirm:  Re: albuterol inhaler  Is she still having symptoms?  Is it helping w/ her symptoms?  Thank you.

## 2017-11-04 NOTE — Telephone Encounter (Signed)
Refill request from pharmacy for Tizanadine 4mg  #45 1.5tabs po qhs    Last visit: 03/29/2017  No follow up visit as of today, left VM with patient to schedule appt.     [facility] [phone] [place] [place]

## 2017-11-07 ENCOUNTER — Ambulatory Visit (INDEPENDENT_AMBULATORY_CARE_PROVIDER_SITE_OTHER): Payer: Medicare Other | Admitting: Ophthalmology

## 2017-11-10 MED ORDER — ALBUTEROL SULFATE 108 (90 BASE) MCG/ACT IN AERS
2.00 | INHALATION_SPRAY | Freq: Four times a day (QID) | RESPIRATORY_TRACT | 0 refills | Status: DC | PRN
Start: 2017-11-10 — End: 2017-11-18

## 2017-11-10 NOTE — Telephone Encounter (Signed)
Reached out to pt several times via telephone & MyChart no response from pt.

## 2017-11-10 NOTE — Telephone Encounter (Signed)
Given [facility] staff unable to reach pt, will RF x 30DS which should last through next PCP f/u.   Add'l RF as appropriate after MD evaluation.     Thanks,    Braulio Bosch, PharmD [facility]   [facility] [place]  Phone:  [phone]  Ext:  [phone]

## 2017-11-16 ENCOUNTER — Telehealth (HOSPITAL_COMMUNITY): Payer: Self-pay

## 2017-11-16 NOTE — Telephone Encounter (Signed)
Called patient to schedule PFT appointment. No response from patient. Message left with office contact information. Letter mailed to patient with office contact information. Will await patient response.

## 2017-11-17 ENCOUNTER — Other Ambulatory Visit: Payer: Self-pay

## 2017-11-18 ENCOUNTER — Ambulatory Visit (HOSPITAL_BASED_OUTPATIENT_CLINIC_OR_DEPARTMENT_OTHER)
Admit: 2017-11-18 | Discharge: 2017-11-18 | Disposition: A | Discharge status: Home Health | Payer: Medicare Other | Attending: Family Practice | Admitting: Family Practice

## 2017-11-18 ENCOUNTER — Other Ambulatory Visit (INDEPENDENT_AMBULATORY_CARE_PROVIDER_SITE_OTHER): Payer: Medicare Other

## 2017-11-18 ENCOUNTER — Encounter (INDEPENDENT_AMBULATORY_CARE_PROVIDER_SITE_OTHER): Payer: Self-pay | Admitting: Family Practice

## 2017-11-18 ENCOUNTER — Ambulatory Visit
Admission: RE | Admit: 2017-11-18 | Discharge: 2017-11-18 | Disposition: A | Discharge status: Home / Self Care | Payer: Medicare Other | Attending: Neuroradiology | Admitting: Neuroradiology

## 2017-11-18 ENCOUNTER — Ambulatory Visit (INDEPENDENT_AMBULATORY_CARE_PROVIDER_SITE_OTHER): Payer: Medicare Other | Admitting: Family Practice

## 2017-11-18 VITALS — BP 105/68 | HR 91 | Temp 98.4°F | Resp 17 | Wt 140.0 lb

## 2017-11-18 DIAGNOSIS — R918 Other nonspecific abnormal finding of lung field: Secondary | ICD-10-CM | POA: Insufficient documentation

## 2017-11-18 DIAGNOSIS — R0602 Shortness of breath: Secondary | ICD-10-CM

## 2017-11-18 DIAGNOSIS — Z1231 Encounter for screening mammogram for malignant neoplasm of breast: Secondary | ICD-10-CM | POA: Insufficient documentation

## 2017-11-18 DIAGNOSIS — R0609 Other forms of dyspnea: Secondary | ICD-10-CM

## 2017-11-18 DIAGNOSIS — Z87891 Personal history of nicotine dependence: Secondary | ICD-10-CM | POA: Insufficient documentation

## 2017-11-18 DIAGNOSIS — W19XXXD Unspecified fall, subsequent encounter: Secondary | ICD-10-CM

## 2017-11-18 DIAGNOSIS — Z0000 Encounter for general adult medical examination without abnormal findings: Secondary | ICD-10-CM

## 2017-11-18 DIAGNOSIS — E039 Hypothyroidism, unspecified: Principal | ICD-10-CM

## 2017-11-18 DIAGNOSIS — Z9181 History of falling: Secondary | ICD-10-CM

## 2017-11-18 DIAGNOSIS — Z1239 Encounter for other screening for malignant neoplasm of breast: Secondary | ICD-10-CM

## 2017-11-18 LAB — LIPID(CHOL FRACT) PANEL, BLOOD
Cholesterol: 231 mg/dL (ref <200)
HDL-Cholesterol: 98 mg/dL
LDL-Chol (Calc): 116 mg/dL (ref <160)
Non-HDL Cholesterol: 133 mg/dL
Triglycerides: 86 mg/dL (ref 10–170)

## 2017-11-18 MED ORDER — ALBUTEROL SULFATE 108 (90 BASE) MCG/ACT IN AERS
2.0000 | INHALATION_SPRAY | Freq: Four times a day (QID) | RESPIRATORY_TRACT | 0 refills | Status: DC | PRN
Start: 2017-11-18 — End: 2017-12-28

## 2017-11-18 NOTE — Interdisciplinary (Signed)
Pre-visit chart review and huddle completed with staff and physician.    Outstanding labs, imaging and consults reviewed and identified.    Health maintanence issues identified and addressed:    Health Maintenance   Topic Date Due    Medicare Annual Wellness Visit  03/15/2016    BREAST CANCER SCREENING  10/28/2017    INFLUENZA VACCINE  01/17/2018    PHQ9 Depression Monitoring doc flowsheet  02/13/2018    COLON CANCER SCREENING WITH COLONOSCOPY  01/16/2022    CERVICAL CANCER SCREENING  01/19/2022    IMM_TD/TDAP=>9 YO  01/20/2027    Hepatitis C Screening  Completed

## 2017-11-18 NOTE — Patient Instructions (Signed)
Please get your labs done in 3 months  I will let you know your results once they are back  Please see me for follow up in 2-3 months

## 2017-11-18 NOTE — Progress Notes (Signed)
SUBJECTIVE:  Debra Bolton is a 57 year old female here for follow up after she got her labs and chest xray  She just got her tests done    She fell on 10/23/17, she slipped in the tub and hit part of her head   She did not have LOC  She went to [facility] ER they did know do any imaging at that time  No headaches since then  Her MS has been worse since her fall, feels like she is having more pain, stabbing burning pain, she is going to contact her neurologist if the pain does not improve    She feels like she had concussion  She had some swelling left side of face she had some swelling on her cheek bone but has improved  No vomiting  No headache waking her from sleep    Hypothyroidism- she is taking 75 mg of armour and libido is a little better    Breathing- she is using inhaler it does help, needs refill of albuterol, she is going to get PFTs done  She can hear her wheezing  Breathing is ok  She is training for 5k        She feels like running helps with mood less depressed    Review of Systems -   See HPI    HISTORY:  Patient Active Problem List   Diagnosis   . Multiple sclerosis (CMS-HCC)   . IgG4 related disease (CMS-HCC)   . Uveitis of right eye   . 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 Mood disorder (CMS-HCC)   . PTSD (post-traumatic stress disorder)     Past Medical History:   Diagnosis Date   . Asthma 1991    with pregnancy   . Hypothyroidism    . Major depressive disorder, single episode    . MS (multiple sclerosis) (CMS-HCC)      Past Surgical History:   Procedure Laterality Date   . leep      for cervical dysplasia   . LYMPH NODE BIOPSY      IgG4 related disease   . TONSILLECTOMY AND ADENOIDECTOMY      only tonsillectomy       Current Outpatient Medications on File Prior to Visit   Medication Sig Dispense Refill   . Amantadine HCl 100 MG tablet Take 100 mg by mouth 2 times daily. 60 tablet 0   . baclofen (LIORESAL) 10 MG tablet Take 1 tablet (10 mg) by mouth 3 times daily. 90 tablet 3   .  diazepam (VALIUM) 5 MG tablet Take 1 tablet (5 mg) by mouth once a week.     . DULoxetine (CYMBALTA) 60 MG CR capsule Take 2 capsules (120 mg) by mouth daily. 60 capsule 5   . gabapentin (NEURONTIN) 300 MG capsule Take 2 capsules (600 mg) by mouth nightly.     . modafinil (PROVIGIL) 200 MG tablet Take 1 tablet (200 mg) by mouth every morning. 30 tablet 0   . nortriptyline (PAMELOR) 25 MG capsule Take 2 capsules (50 mg) by mouth nightly. 60 capsule 5   . thyroid (ARMOUR THYROID) 60 MG tablet Take 1 tablet (60 mg) by mouth daily. 30 tablet 1   . thyroid (ARMOUR) 15 MG tablet Take 1 tablet (15 mg) by mouth daily. 30 tablet 0   . tiZANidine (ZANAFLEX) 4 MG tablet Take 1.5 tablets (6 mg) by mouth at bedtime. 45 tablet 2     No current facility-administered medications  on file prior to visit.      Allergies   Allergen Reactions   . Penicillins Rash   . Modafinil Diarrhea and Nausea and Vomiting     Family History   Problem Relation Name Age of Onset   . Other Mother Zara Chess    . Thyroid Mother Zara Chess    . Cancer Father don mc clain         lung   . Cancer Brother     . Cancer Sister Zara Chess         breast   . Diabetes Sister suzanne mcclain    . Stroke Brother Jeremy Johann      Social History     Socioeconomic History   . Marital status: Divorced     Spouse name: Not on file   . Number of children: Not on file   . Years of education: Not on file   . Highest education level: Not on file   Occupational History   . Not on file   Social Needs   . Financial resource strain: Not on file   . Food insecurity:     Worry: Not on file     Inability: Not on file   . Transportation needs:     Medical: Not on file     Non-medical: Not on file   Tobacco Use   . Smoking status: Former Smoker     Packs/day: 1.00     Years: 26.00     Pack years: 26.00     Types: Cigarettes     Last attempt to quit: 02/01/2016     Years since quitting: 1.8   . Smokeless tobacco: Never Used   Substance and Sexual Activity   . Alcohol use: Yes      Alcohol/week: 10.0 oz     Types: 4 Glasses of wine per week   . Drug use: Not on file   . Sexual activity: Yes     Partners: Male   Lifestyle   . Physical activity:     Days per week: Not on file     Minutes per session: Not on file   . Stress: Not on file   Relationships   . Social connections:     Talks on phone: Not on file     Gets together: Not on file     Attends religious service: Not on file     Active member of club or organization: Not on file     Attends meetings of clubs or organizations: Not on file     Relationship status: Not on file   . Intimate partner violence:     Fear of current or ex partner: Not on file     Emotionally abused: Not on file     Physically abused: Not on file     Forced sexual activity: Not on file   Other Topics Concern   . Military Service Not Asked   . Blood Transfusions Not Asked   . Caffeine Concern Not Asked   . Occupational Exposure Not Asked   . Hobby Hazards Not Asked   . Sleep Concern Not Asked   . Stress Concern Not Asked   . Weight Concern Not Asked   . Special Diet Not Asked   . Back Care Not Asked   . Exercise Not Asked   . Bike Helmet Not Asked   . Seat Belt Not Asked   . Self-Exams Not Asked  Social History Narrative    Moved from [place] is great, high fiber, she is very conscientious of effect of diet on health.    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    Hasn't had a cigarette in 8 months.    Has smoked 26 years, less than a pack a day.    Currently drinks 2-4 drinks a day on the weekends, total 4-8 per week.    Sexually active with one female partner.    Currently not working, has been on disability for most of the time from 1992 to now.       OBJECTIVE:    BP 105/68   Pulse 91   Temp 98.4 F (36.9 C) (Oral)   Resp 17   Wt 63.5 kg (139 lb 15.9 oz)   SpO2 97%   BMI 24.03 kg/m     Body mass index is 24.03 kg/m.  Wt Readings from Last 5 Encounters:   11/18/17 63.5 kg (139 lb 15.9 oz)   10/19/17 61.7 kg (136 lb)   10/14/17 62.1 kg (136 lb 14.5 oz)    07/19/17 62.8 kg (138 lb 8 oz)   07/06/17 64 kg (141 lb)     Blood Pressure   11/18/17 105/68   10/19/17 128/70   10/14/17 117/78   07/19/17 119/80   07/06/17 112/79       General: WDWN, NAD, answers appropriately  Eyes: anicteric, PERRL, EOMI  HENT: moist oral mucosa, no oral lesions  Heart: RRR, no MRG  Lungs: normal effort, CTAB, no wheezes, rhonchi, crackles  Neuro: CN II-XII normal, 5/5 strength bilateral upper and lower extremities, finger to nose normal  Pscyh: alert and oriented x 3, appropriate affect  ASSESMENT AND PLAN: 57 yo female here for follow up hypothyroidism and breathing, her libido is better on the higher dose of armour, states breathing is better on albuterol, will refill she will get PFTs done; also she fell in July, went to [facility] ER after fall did not get any head imaging, states she does not have headache, no vomiting, with no neurologic deficits noted on exam, discussed reassurance to patient and reviewed ER precautions ; discussed if her MS symptoms do not improve she should see her neurologist.      ICD-10-CM ICD-9-CM    1. Hypothyroidism, unspecified type E03.9 244.9 TSH, Blood - See Instructions   2. SOB (shortness of breath) on exertion R06.02 786.05 albuterol (VENTOLIN HFA) 108 (90 Base) MCG/ACT inhaler   3. Fall, subsequent encounter W19.XXXD V58.89      E888.9        F/U: Return for 2-3 months f/u breathing.     Medication Management:  Medications reviewed with patient and medication list reconciled.  Over the counter medications, herbal therapies and supplements reviewed.  Patient's understanding and response to medications assessed.    Barriers to medications assessed and addressed.  Risks, benefits, alternatives to medications reviewed.    Randa Ngo, MD  Department of Family Medicine and [place], [employer]  HS Assistant Clinical Professor, [employer]

## 2017-11-18 NOTE — Interdisciplinary (Signed)
Blood drawn from right arm with 21 gauge needle. 1 tubes taken.   Patient identity authenticated by Cynthia Perez.

## 2017-11-21 DIAGNOSIS — G894 Chronic pain syndrome: Secondary | ICD-10-CM | POA: Diagnosis not present

## 2017-11-21 DIAGNOSIS — Z8781 Personal history of (healed) traumatic fracture: Secondary | ICD-10-CM | POA: Diagnosis not present

## 2017-11-21 DIAGNOSIS — M5090 Cervical disc disorder, unspecified, unspecified cervical region: Secondary | ICD-10-CM | POA: Diagnosis not present

## 2017-11-21 DIAGNOSIS — M546 Pain in thoracic spine: Secondary | ICD-10-CM | POA: Diagnosis not present

## 2017-11-21 DIAGNOSIS — Z720 Tobacco use: Secondary | ICD-10-CM | POA: Diagnosis not present

## 2017-11-21 DIAGNOSIS — M5136 Other intervertebral disc degeneration, lumbar region: Secondary | ICD-10-CM | POA: Diagnosis not present

## 2017-11-21 DIAGNOSIS — G5600 Carpal tunnel syndrome, unspecified upper limb: Secondary | ICD-10-CM | POA: Diagnosis not present

## 2017-11-21 DIAGNOSIS — Z79891 Long term (current) use of opiate analgesic: Secondary | ICD-10-CM | POA: Diagnosis not present

## 2017-11-21 DIAGNOSIS — F1120 Opioid dependence, uncomplicated: Secondary | ICD-10-CM | POA: Diagnosis not present

## 2017-12-05 ENCOUNTER — Other Ambulatory Visit (INDEPENDENT_AMBULATORY_CARE_PROVIDER_SITE_OTHER): Payer: Self-pay | Admitting: Family Practice

## 2017-12-05 DIAGNOSIS — E039 Hypothyroidism, unspecified: Secondary | ICD-10-CM

## 2017-12-06 NOTE — Telephone Encounter (Signed)
Debra Bolton, CPhT  ([facility])          Hypothyroid Medication Refill Protocol    Last visit: 11/18/2017 f/u results    Last pertinent visit: 11/18/17  Next f/u appt due:  2-3 mons around 02/18/18 f/u breathing  Next scheduled appointment: none       Per OV 11/18/17:  SUBJECTIVE:  Hypothyroidism- she is taking 75 mg of armour and libido is a little better  ASSESMENT AND PLAN:     1. Hypothyroidism, unspecified type E03.9 244.9 TSH, Blood - See Instructions   Return for 2-3 months f/u breathing.   Patient instructions:  Please get your labs done in 3 months  I will let you know your results once they are back              LABS required:  (Q year TSH)  *If recent dose change: check TSH 2 months after starting new  dose.    Lab Results   Component Value Date    TSH 0.33 10/19/2017    FREET4 0.86 (L) 08/10/2017         Monitoring required:  (Q year BP, HR, Wt)   *If pt is pregnant, DEFER TO MD*    (BP range: UVJDYNXG=33-582  PPGFQMKJI=31-28)  Blood Pressure   11/18/17 105/68   10/19/17 128/70   10/14/17 117/78       (HR range: 55-110)   Pulse Readings from Last 3 Encounters:   11/18/17 91   10/19/17 97   10/14/17 82       Wt Readings from Last 3 Encounters:   11/18/17 63.5 kg (139 lb 15.9 oz)   10/19/17 61.7 kg (136 lb)   10/14/17 62.1 kg (136 lb 14.5 oz)

## 2017-12-08 MED ORDER — THYROID 60 MG OR TABS
60.0000 mg | ORAL_TABLET | Freq: Every day | ORAL | 0 refills | Status: DC
Start: 2017-12-08 — End: 2018-03-09

## 2017-12-08 MED ORDER — THYROID 15 MG OR TABS
15.0000 mg | ORAL_TABLET | Freq: Every day | ORAL | 0 refills | Status: DC
Start: 2017-12-08 — End: 2018-03-09

## 2017-12-13 DIAGNOSIS — I10 Essential (primary) hypertension: Secondary | ICD-10-CM | POA: Diagnosis not present

## 2017-12-13 DIAGNOSIS — N3000 Acute cystitis without hematuria: Secondary | ICD-10-CM | POA: Diagnosis not present

## 2017-12-26 ENCOUNTER — Ambulatory Visit (INDEPENDENT_AMBULATORY_CARE_PROVIDER_SITE_OTHER): Payer: Medicare Other | Admitting: Ophthalmology

## 2017-12-26 DIAGNOSIS — Z135 Encounter for screening for eye and ear disorders: Principal | ICD-10-CM

## 2017-12-26 DIAGNOSIS — H209 Unspecified iridocyclitis: Secondary | ICD-10-CM

## 2017-12-26 MED ORDER — PREDNISOLONE ACETATE 1 % OP SUSP
1.0000 [drp] | Freq: Four times a day (QID) | OPHTHALMIC | 3 refills | Status: AC
Start: 2017-12-26 — End: 2018-12-21

## 2017-12-26 NOTE — Progress Notes (Signed)
ATTENDING ATTESTATION  I saw and evaluated the patient and agree with the fellow's findings and plans as written except/in addition to as noted below. Records, laboratory, medications, imaging studies personally reviewed. Plan discussed with patient in detail and all questions were answered.      SIGNED: Waylan Rocher, MD        Screening for eye condition  -     OCT, [place]    Uveitis of right eye  Overview:  H/o of AAU: mildly active OD    - h/o f MS, IgG4 stable, on ritxuan    Cataract IY:IYUW wait until quiet for 6 months    ? H/o of amblyopia OD: h/o of poor vision as child    Assessment & Plan:  HPI: Blurred vision ,right eye)s),  gradual onset, mild severity constant in duration, worse      OCT Interpretation:    OD: No fluid that is  Stable.    OS: No fluid that is Stable.      Appears still active.  - start  PF QID.    - patient may also be due for rituxan, which helped her uveitis (Dr. Candiss Norse).  - Refer to Dr. Frederico Hamman (uveitis) for followup in 2 weeks to help manage topical vs. systemic treatment of uveitis.       Orders:  -     prednisoLONE acetate (PRED FORTE) 1 % ophthalmic suspension        Return in about 2 weeks (around 01/09/2018).

## 2017-12-26 NOTE — Assessment & Plan Note (Addendum)
HPI: Blurred vision ,right eye)s),  gradual onset, mild severity constant in duration, worse      OCT Interpretation:    OD: No fluid that is  Stable.    OS: No fluid that is Stable.      Appears still active.  - start  PF QID.    - patient may also be due for rituxan, which helped her uveitis (Dr. Candiss Norse).  - Refer to Dr. Frederico Hamman (uveitis) for followup in 2 weeks to help manage topical vs. systemic treatment of uveitis.

## 2017-12-27 ENCOUNTER — Other Ambulatory Visit: Payer: Self-pay

## 2017-12-27 ENCOUNTER — Ambulatory Visit (INDEPENDENT_AMBULATORY_CARE_PROVIDER_SITE_OTHER): Payer: Medicare Other | Admitting: Internal Medicine

## 2017-12-27 ENCOUNTER — Encounter (INDEPENDENT_AMBULATORY_CARE_PROVIDER_SITE_OTHER): Payer: Self-pay | Admitting: Internal Medicine

## 2017-12-27 ENCOUNTER — Other Ambulatory Visit (INDEPENDENT_AMBULATORY_CARE_PROVIDER_SITE_OTHER): Payer: Medicare Other | Attending: Internal Medicine

## 2017-12-27 VITALS — BP 116/76 | HR 91 | Temp 99.0°F | Resp 16 | Ht 64.0 in | Wt 139.4 lb

## 2017-12-27 DIAGNOSIS — Z79891 Long term (current) use of opiate analgesic: Secondary | ICD-10-CM | POA: Diagnosis not present

## 2017-12-27 DIAGNOSIS — Z720 Tobacco use: Secondary | ICD-10-CM | POA: Diagnosis not present

## 2017-12-27 DIAGNOSIS — G5600 Carpal tunnel syndrome, unspecified upper limb: Secondary | ICD-10-CM | POA: Diagnosis not present

## 2017-12-27 DIAGNOSIS — M5136 Other intervertebral disc degeneration, lumbar region: Secondary | ICD-10-CM | POA: Diagnosis not present

## 2017-12-27 DIAGNOSIS — Z8781 Personal history of (healed) traumatic fracture: Secondary | ICD-10-CM | POA: Diagnosis not present

## 2017-12-27 DIAGNOSIS — M5090 Cervical disc disorder, unspecified, unspecified cervical region: Secondary | ICD-10-CM | POA: Diagnosis not present

## 2017-12-27 DIAGNOSIS — M546 Pain in thoracic spine: Secondary | ICD-10-CM | POA: Diagnosis not present

## 2017-12-27 DIAGNOSIS — G894 Chronic pain syndrome: Secondary | ICD-10-CM | POA: Diagnosis not present

## 2017-12-27 DIAGNOSIS — F1120 Opioid dependence, uncomplicated: Secondary | ICD-10-CM | POA: Diagnosis not present

## 2017-12-27 DIAGNOSIS — D8989 Other specified disorders involving the immune mechanism, not elsewhere classified: Secondary | ICD-10-CM

## 2017-12-27 DIAGNOSIS — R590 Localized enlarged lymph nodes: Secondary | ICD-10-CM

## 2017-12-27 DIAGNOSIS — G35 Multiple sclerosis: Secondary | ICD-10-CM | POA: Insufficient documentation

## 2017-12-27 DIAGNOSIS — Z79899 Other long term (current) drug therapy: Secondary | ICD-10-CM

## 2017-12-27 DIAGNOSIS — E039 Hypothyroidism, unspecified: Secondary | ICD-10-CM | POA: Insufficient documentation

## 2017-12-27 DIAGNOSIS — H209 Unspecified iridocyclitis: Secondary | ICD-10-CM | POA: Insufficient documentation

## 2017-12-27 DIAGNOSIS — K1120 Sialoadenitis, unspecified: Secondary | ICD-10-CM

## 2017-12-27 LAB — CBC WITH DIFF, BLOOD
ANC-Automated: 4.9 10*3/uL (ref 1.6–7.0)
Abs Basophils: 0 10*3/uL (ref <0.1)
Abs Eosinophils: 0.1 10*3/uL (ref 0.1–0.5)
Abs Lymphs: 3.2 10*3/uL — ABNORMAL HIGH (ref 0.8–3.1)
Abs Monos: 0.7 10*3/uL (ref 0.2–0.8)
Basophils: 0 %
Eosinophils: 1 %
Hct: 40.5 % (ref 34.0–45.0)
Hgb: 13.1 gm/dL (ref 11.2–15.7)
Lymphocytes: 35 %
MCH: 30.2 pg (ref 26.0–32.0)
MCHC: 32.3 g/dL (ref 32.0–36.0)
MCV: 93.3 um3 (ref 79.0–95.0)
MPV: 9.7 fL (ref 9.4–12.4)
Monocytes: 8 %
Plt Count: 276 10*3/uL (ref 140–370)
RBC: 4.34 10*6/uL (ref 3.90–5.20)
RDW: 12.3 % (ref 12.0–14.0)
Segs: 55 %
WBC: 9 10*3/uL (ref 4.0–10.0)

## 2017-12-27 LAB — COMPREHENSIVE METABOLIC PANEL, BLOOD
ALT (SGPT): 20 U/L (ref 0–33)
AST (SGOT): 26 U/L (ref 0–32)
Albumin: 4.6 g/dL (ref 3.5–5.2)
Alkaline Phos: 50 U/L (ref 35–140)
Anion Gap: 13 mmol/L (ref 7–15)
BUN: 17 mg/dL (ref 6–20)
Bicarbonate: 27 mmol/L (ref 22–29)
Bilirubin, Tot: 0.42 mg/dL (ref <1.2)
Calcium: 10.2 mg/dL (ref 8.5–10.6)
Chloride: 99 mmol/L (ref 98–107)
Creatinine: 0.76 mg/dL (ref 0.51–0.95)
GFR: >60 mL/min
Glucose: 77 mg/dL (ref 70–99)
Potassium: 3.8 mmol/L (ref 3.5–5.1)
Sodium: 139 mmol/L (ref 136–145)
Total Protein: 7.6 g/dL (ref 6.0–8.0)

## 2017-12-27 LAB — C-REACTIVE PROTEIN, BLOOD: CRP: 0.07 mg/dL (ref <0.5)

## 2017-12-27 LAB — TSH, BLOOD: TSH: 0.21 u[IU]/mL — ABNORMAL LOW (ref 0.27–4.20)

## 2017-12-27 LAB — IGE, BLOOD: IGE: 73 [IU]/mL (ref 0–99)

## 2017-12-27 LAB — SED RATE, BLOOD: Sed Rate: 6 mm/hr (ref 0–30)

## 2017-12-27 MED ORDER — PREDNISOLONE SODIUM PHOSPHATE 15 MG/5ML OR SOLN
15.00 mg | Freq: Every day | ORAL | Status: DC
Start: ? — End: 2019-02-08

## 2017-12-27 NOTE — Progress Notes (Signed)
Referring Provider:  Edi, Verl Blalock, MD  [address] / [place]  [place], [place] [address]    Chief Complaint  Chief Complaint   Patient presents with   . Follow Up   1. Multiple Sclerosis, relapsing form  2. Anterior uveitis  3. IgG related lymphadenopathy, chronic sialoadenitis    History of present Illness  57 year old female with history of multiple sclerosis diagnosed 1991, presented with numbness in both legs, seizures of 4 years duration.  Multiple relapses since then.  Previously treated with Copaxone.  Started Natalizumab in 2014 with good response and improvement in pain and numbness in the extremities.  Currently taking Cymbalta and gabapentin which appear to help with pain in her extremities.    History of anterior uveitis for 1 year, treated with topical corticosteroids.  Seen by Ophthalmology at [facility].  Previous labs from [place] from April 2017 revealed normal SSA, SSB, QuantiFERON TB, cell counts, CMP, inflammatory markers.    She is a chronic heavy smoker, about a year ago she had change in voice since cervical lymphadenopathy.  Underwent removal of enlarged tonsil and cervical lymph node to rule out malignancy.  Biopsy was negative for malignancy, however suggested IgG4 related disease with 50 cells per high-power field in lymph node with IgG4 staining.  Workup for systemic disease was done with CT chest abdomen pelvis 08/11/2015 which was negative for lymphadenopathy and for retroperitoneal fibrosis.  No pancreatitis.  Neck ultrasound showed bilateral submandibular gland enlargement with heterogeneous echotexture, likely secondary to IgG4 related disease    Switched from natalizumab to rituximab for multiple sclerosis, in light of history of uveitis and IgG4 related disease, by Neurology.  Received rituximab 500 mg IV 2 doses 2 weeks apart in March 2019.  Did well for several months after the infusion.  In the last 2 weeks, she has had marked increase in fatigue, submandibular swelling and  mild pain.  Intermittent abdominal pain, on both sides of midline, usually postprandial.  No change in bowel movements.  No vomiting.  No new neurologic symptoms.     Recurrent right subacromial bursitis status post bursa injection on multiple occasions with good response.  Currently does not report significant right shoulder pain. Doing yoga regularly, which has helped her pain tremendously.  No other joints with pain swelling or stiffness.    No unexpected appetite or weight changes.  No persistent fever.      Skin: No photosensitivity, rash or ulceration. No skin psoriasis  Hair: No unusual hair loss  Oral: No oral ulceration or sore throat   Eyes:  Per HPI  Ears: No changes in hearing, ear pain or discharge  Abd: No discomfort. No change in bowel habits  Resp: No difficulty breathing, chest pain or chronic cough  Genitourinary: No difficulty or pain with urination  Systemic: No fever, malaise, changes in weight or appetite. No h/o recurrent infections  Neurological:  Per HPI  Extremities: No h/o Raynaud's phenomenon. No other joints with swelling, pain or redness    Review of Systems  As per HPI    Medications and Allergies were reviewed with the patient.       Current Outpatient Medications:   .  albuterol (VENTOLIN HFA) 108 (90 Base) MCG/ACT inhaler, Inhale 2 puffs by mouth every 6 hours as needed for Wheezing., Disp: 1 Inhaler, Rfl: 0  .  Amantadine HCl 100 MG tablet, Take 100 mg by mouth 2 times daily., Disp: 60 tablet, Rfl: 0  .  baclofen (LIORESAL) 10 MG tablet, Take 1 tablet (10 mg) by mouth 3 times daily., Disp: 90 tablet, Rfl: 3  .  diazepam (VALIUM) 5 MG tablet, Take 1 tablet (5 mg) by mouth once a week., Disp: , Rfl:   .  DULoxetine (CYMBALTA) 60 MG CR capsule, Take 2 capsules (120 mg) by mouth daily., Disp: 60 capsule, Rfl: 5  .  gabapentin (NEURONTIN) 300 MG capsule, Take 2 capsules (600 mg) by mouth nightly., Disp: , Rfl:   .  modafinil (PROVIGIL) 200 MG tablet, Take 1 tablet (200 mg) by mouth  every morning., Disp: 30 tablet, Rfl: 0  .  nortriptyline (PAMELOR) 25 MG capsule, Take 2 capsules (50 mg) by mouth nightly., Disp: 60 capsule, Rfl: 5  .  prednisoLONE (ORAPRED) 15 MG/5ML solution, Take 15 mg by mouth daily., Disp: , Rfl:   .  prednisoLONE acetate (PRED FORTE) 1 % ophthalmic suspension, Place 1 drop into both eyes 4 times daily., Disp: 1 bottle, Rfl: 3  .  thyroid (ARMOUR THYROID) 60 MG tablet, Take 1 tablet (60 mg) by mouth daily., Disp: 90 tablet, Rfl: 0  .  thyroid (ARMOUR) 15 MG tablet, Take 1 tablet (15 mg) by mouth daily., Disp: 90 tablet, Rfl: 0  .  tiZANidine (ZANAFLEX) 4 MG tablet, Take 1.5 tablets (6 mg) by mouth at bedtime., Disp: 45 tablet, Rfl: 2    Allergies  Penicillins and Modafinil    Past Medical History:   Diagnosis Date   . Asthma 1991    with pregnancy   . Hypothyroidism    . Major depressive disorder, single episode    . MS (multiple sclerosis) (CMS-HCC)        Past Surgical History:   Procedure Laterality Date   . leep      for cervical dysplasia   . LYMPH NODE BIOPSY      IgG4 related disease   . TONSILLECTOMY AND ADENOIDECTOMY      only tonsillectomy       Family History   Problem Relation Name Age of Onset   . Other Mother Zara Chess    . Thyroid Mother Zara Chess    . Cancer Father don mc clain         lung   . Cancer Brother     . Cancer Sister Zara Chess         breast   . Diabetes Sister suzanne mcclain    . Stroke Brother Jeremy Johann        Social History     Socioeconomic History   . Marital status: Divorced     Spouse name: Not on file   . Number of children: Not on file   . Years of education: Not on file   . Highest education level: Not on file   Occupational History   . Not on file   Social Needs   . Financial resource strain: Not on file   . Food insecurity:     Worry: Not on file     Inability: Not on file   . Transportation needs:     Medical: Not on file     Non-medical: Not on file   Tobacco Use   . Smoking status: Former Smoker     Packs/day: 1.00      Years: 26.00     Pack years: 26.00     Types: Cigarettes     Last attempt to quit: 02/01/2016     Years since  quitting: 1.9   . Smokeless tobacco: Never Used   Substance and Sexual Activity   . Alcohol use: Yes     Alcohol/week: 10.0 oz     Types: 4 Glasses of wine per week   . Drug use: Not on file   . Sexual activity: Yes     Partners: Male   Lifestyle   . Physical activity:     Days per week: Not on file     Minutes per session: Not on file   . Stress: Not on file   Relationships   . Social connections:     Talks on phone: Not on file     Gets together: Not on file     Attends religious service: Not on file     Active member of club or organization: Not on file     Attends meetings of clubs or organizations: Not on file     Relationship status: Not on file   . Intimate partner violence:     Fear of current or ex partner: Not on file     Emotionally abused: Not on file     Physically abused: Not on file     Forced sexual activity: Not on file   Other Topics Concern   . Military Service Not Asked   . Blood Transfusions Not Asked   . Caffeine Concern Not Asked   . Occupational Exposure Not Asked   . Hobby Hazards Not Asked   . Sleep Concern Not Asked   . Stress Concern Not Asked   . Weight Concern Not Asked   . Special Diet Not Asked   . Back Care Not Asked   . Exercise Not Asked   . Bike Helmet Not Asked   . Seat Belt Not Asked   . Self-Exams Not Asked   Social History Narrative    [place], she is very conscientious of effect of diet on health.    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    Hasn't had a cigarette in 8 months.    Has smoked 26 years, less than a pack a day.    Currently drinks 2-4 drinks a day on the weekends, total 4-8 per week.    Sexually active with one female partner.    Currently not working, has been on disability for most of the time from 1992 to now.         Physical Exam  Vitals:    12/27/17 1431   BP: 116/76   BP Location: Left arm   BP Patient Position: Sitting    BP cuff size: Regular   Pulse: 91   Resp: 16   Temp: 99 F (37.2 C)   TempSrc: Oral   Weight: 63.2 kg (139 lb 6.4 oz)   Height: 5' 4"  (1.626 m)     Gen: Alert, cooperative  Psych: Appropriate affect  Neck:  Prominent submandibular and parotid glands, minimal tenderness.  No discrete lymph nodes palpable  Skin: No rheumatologic rashes or ulcers  Eyes: No conjunctival erythema, EOMI. No scleral icterus  ENT: No oral mucositis, no sinus tenderness, no ear discharge  CV: Regular rhythm, normal rate  Resp: Breathing comfortably, no tachypnea  Abd: Soft, mild epigastric tenderness.  Joint exam: A complete joint exam revealed no active synovitis in upper or lower extremity joints. Good range of motion in shoulder and pelvic girdles  Neuro: Oriented to time, place and person  Most recent labs and imaging were reviewed and discussed with the patient.  50 pages of outside records reviewed, outlined in HPI    08/08/2015  IgG4 level 215 upper limit 113m/dl  Total IgG 1050 upper limit 1590    01/12/17  IgG4 level 193  03/29/17:  IgG4 elevated at 205.  Normal IgG    Impression    ICD-10-CM ICD-9-CM   1. 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 Multiple sclerosis (CMS-HCC) G35 340   3. Uveitis of right eye H20.9 364.3   4. Lymphadenopathy, cervical R59.0 785.6   5. Sialadenitis K11.20 527.2   6. High risk medication use Z79.899 V58.69       Debra Bolton a 57year old female with multiple sclerosis, followed by Neurology.  Previously well controlled on treatment with natalizumab.    Switch to rituximab in March 2019 due to history of IgG4 related disease, with cervical lymphadenopathy, submandibular gland enlargement and recurrent uveitis of right eye.  Chronic nerve pain well controlled with Cymbalta and gabapentin.    IgG4 related disease on histopathology of enlarged right cervical lymph nodes.  No pancreatitis, no other systemic lymphadenopathy on CT chest abdomen pelvis performed April 2017.    History 2  episodes of anterior uveitis in the last 1 year.  Negative ANA HLA B27, Qft TB    PLAN:  - repeat rituximab 1 g IV every 2 weeks-2 doses for IgG4 related disease with chronic sialadenitis, cervical lymphadenopathy, possibly mild pancreatitis given the ongoing epigastric pain  - monitor IgG4 levels  - methotrexate can be considered for recurrent uveitis, if poor response to rituximab      Orders Placed This Encounter   Procedures   . IMcKenna  . CBC w/ Diff Lavender   . Comprehensive Metabolic Panel Green Plasma Separator Tube   . C-Reactive Protein, Blood Green Plasma Separator Tube   . Sedimentation Rate (ESR), Blood Lavender   . IgG Subclass, Blood Yellow serum separator tube   . IgE, Blood - See Instructions        Requested Prescriptions      No prescriptions requested or ordered in this encounter       Return in about 3 months (around 03/28/2018).

## 2017-12-28 ENCOUNTER — Other Ambulatory Visit (INDEPENDENT_AMBULATORY_CARE_PROVIDER_SITE_OTHER): Payer: Self-pay | Admitting: Family Practice

## 2017-12-28 ENCOUNTER — Encounter (INDEPENDENT_AMBULATORY_CARE_PROVIDER_SITE_OTHER): Payer: Self-pay | Admitting: Family Practice

## 2017-12-28 ENCOUNTER — Telehealth (HOSPITAL_BASED_OUTPATIENT_CLINIC_OR_DEPARTMENT_OTHER): Payer: Self-pay

## 2017-12-28 DIAGNOSIS — R0602 Shortness of breath: Secondary | ICD-10-CM

## 2017-12-28 LAB — TOTAL B CELL (CD19), BLOOD
CD19 B-Cell %: <1 % — ABNORMAL LOW (ref 6–23)
CD19 B-Cell Abs: 30 cells/uL — ABNORMAL LOW (ref 50–350)

## 2017-12-28 NOTE — Telephone Encounter (Signed)
Patient called to schedule infusion center appointment.    Request forwarded to Scheduling Coordinators.    Patiet's phone: [phone]  Patient advised call will be returned by end of day.

## 2017-12-28 NOTE — Telephone Encounter (Signed)
Tired returning pt's call to schedule RITUXAN apt. Left a message to pt to call back to schedule.

## 2017-12-28 NOTE — Telephone Encounter (Signed)
Melisa Sharen Counter, CPhT  ([facility])          Bronchodilator Refill Protocol    Last visit: 11/18/2017 f/u results    Last pertinent visit: 11/18/17  Next f/u appt due:  2-3 mons around 02/18/18 f/u breathing  Next scheduled appointment: None w/ PCP; Sees other specialties       Per OV 11/18/17:  SUBJECTIVE:  Breathing- she is using inhaler it does help, needs refill of albuterol, she is going to get PFTs done  She can hear her wheezing  Breathing is ok  She is training for 5k  ASSESMENT AND PLAN:   states breathing is better on albuterol, will refill she will get PFTs done  2. SOB (shortness of breath) on exertion R06.02 786.05 albuterol (VENTOLIN HFA) 108 (90 Base) MCG/ACT inhaler   Return for 2-3 months f/u breathing

## 2017-12-29 ENCOUNTER — Encounter (HOSPITAL_BASED_OUTPATIENT_CLINIC_OR_DEPARTMENT_OTHER): Payer: Self-pay | Admitting: Neurology

## 2017-12-29 DIAGNOSIS — G35 Multiple sclerosis: Principal | ICD-10-CM

## 2017-12-29 DIAGNOSIS — G894 Chronic pain syndrome: Secondary | ICD-10-CM

## 2017-12-29 DIAGNOSIS — F339 Major depressive disorder, recurrent, unspecified: Secondary | ICD-10-CM

## 2017-12-29 MED ORDER — ALBUTEROL SULFATE 108 (90 BASE) MCG/ACT IN AERS
2.00 | INHALATION_SPRAY | Freq: Four times a day (QID) | RESPIRATORY_TRACT | 3 refills | Status: DC | PRN
Start: 2017-12-29 — End: 2020-03-28

## 2017-12-29 MED ORDER — DULOXETINE HCL 60 MG OR CPEP
120.0000 mg | ORAL_CAPSULE | Freq: Every day | ORAL | 5 refills | Status: DC
Start: 2017-12-29 — End: 2018-01-24

## 2017-12-29 NOTE — Telephone Encounter (Signed)
Returned pt's call, had left a message on scheduling line. Requesting to schedule RITUXAN at [facility] office. We were able to make apts for 9/16 and 9/30.

## 2017-12-29 NOTE — Telephone Encounter (Addendum)
Patient requesting medication refill of: Duloxetine 60mg     Last visit: 10/19/17  Next visit: 04/27/2017    Routed to MDs as Rx request

## 2017-12-30 LAB — IGG SUBCLASS PANEL, BLOOD
IGG Subclass 1: 536 mg/dL (ref 240–1118)
IGG Subclass 2: 396 mg/dL (ref 124–549)
IGG Subclass 3: 18 mg/dL — ABNORMAL LOW (ref 21–134)
IGG Subclass 4: 174 mg/dL — ABNORMAL HIGH (ref 1–123)

## 2018-01-02 ENCOUNTER — Ambulatory Visit
Admission: RE | Admit: 2018-01-02 | Discharge: 2018-01-04 | Disposition: A | Discharge status: Home / Self Care | Payer: Medicare Other | Attending: Internal Medicine | Admitting: Internal Medicine

## 2018-01-02 VITALS — BP 137/86 | HR 89 | Temp 98.1°F | Resp 16 | Ht 64.0 in

## 2018-01-02 DIAGNOSIS — I10 Essential (primary) hypertension: Secondary | ICD-10-CM | POA: Diagnosis not present

## 2018-01-02 DIAGNOSIS — Z681 Body mass index (BMI) 19 or less, adult: Secondary | ICD-10-CM | POA: Diagnosis not present

## 2018-01-02 DIAGNOSIS — J449 Chronic obstructive pulmonary disease, unspecified: Secondary | ICD-10-CM | POA: Diagnosis not present

## 2018-01-02 DIAGNOSIS — N3000 Acute cystitis without hematuria: Secondary | ICD-10-CM | POA: Diagnosis not present

## 2018-01-02 DIAGNOSIS — F17203 Nicotine dependence unspecified, with withdrawal: Secondary | ICD-10-CM | POA: Diagnosis not present

## 2018-01-02 DIAGNOSIS — F321 Major depressive disorder, single episode, moderate: Secondary | ICD-10-CM | POA: Diagnosis not present

## 2018-01-02 DIAGNOSIS — E782 Mixed hyperlipidemia: Secondary | ICD-10-CM | POA: Diagnosis not present

## 2018-01-02 DIAGNOSIS — G35 Multiple sclerosis: Secondary | ICD-10-CM | POA: Insufficient documentation

## 2018-01-02 DIAGNOSIS — Z5112 Encounter for antineoplastic immunotherapy: Secondary | ICD-10-CM | POA: Insufficient documentation

## 2018-01-02 DIAGNOSIS — D8989 Other specified disorders involving the immune mechanism, not elsewhere classified: Secondary | ICD-10-CM | POA: Insufficient documentation

## 2018-01-02 DIAGNOSIS — H209 Unspecified iridocyclitis: Secondary | ICD-10-CM | POA: Insufficient documentation

## 2018-01-02 LAB — CBC WITH DIFF, BLOOD
ANC-Automated: 2.6 10*3/uL (ref 1.6–7.0)
ANC-Instrument: 2.6 10*3/uL (ref 1.6–7.0)
Abs Basophils: 0 10*3/uL (ref <0.1)
Abs Eosinophils: 0.2 10*3/uL (ref 0.1–0.5)
Abs Lymphs: 2.4 10*3/uL (ref 0.8–3.1)
Abs Monos: 0.5 10*3/uL (ref 0.2–0.8)
Basophils: 0 %
Eosinophils: 3 %
Hct: 36.7 % (ref 34.0–45.0)
Hgb: 12.3 gm/dL (ref 11.2–15.7)
Lymphocytes: 42 %
MCH: 30.9 pg (ref 26.0–32.0)
MCHC: 33.5 g/dL (ref 32.0–36.0)
MCV: 92.2 um3 (ref 79.0–95.0)
MPV: 8.8 fL — ABNORMAL LOW (ref 9.4–12.4)
Monocytes: 9 %
Plt Count: 239 10*3/uL (ref 140–370)
RBC: 3.98 10*6/uL (ref 3.90–5.20)
RDW: 12.6 % (ref 12.0–14.0)
Segs: 46 %
WBC: 5.8 10*3/uL (ref 4.0–10.0)

## 2018-01-02 LAB — COMPREHENSIVE METABOLIC PANEL, BLOOD
ALT (SGPT): 13 U/L (ref 0–33)
AST (SGOT): 24 U/L (ref 0–32)
Albumin: 4.3 g/dL (ref 3.5–5.2)
Alkaline Phos: 46 U/L (ref 35–140)
Anion Gap: 11 mmol/L (ref 7–15)
BUN: 9 mg/dL (ref 6–20)
Bicarbonate: 24 mmol/L (ref 22–29)
Bilirubin, Tot: 0.43 mg/dL (ref <1.2)
Calcium: 9.5 mg/dL (ref 8.5–10.6)
Chloride: 104 mmol/L (ref 98–107)
Creatinine: 0.72 mg/dL (ref 0.51–0.95)
GFR: >60 mL/min
Glucose: 85 mg/dL (ref 70–99)
Potassium: 4 mmol/L (ref 3.5–5.1)
Sodium: 139 mmol/L (ref 136–145)
Total Protein: 6.6 g/dL (ref 6.0–8.0)

## 2018-01-02 MED ORDER — SODIUM CHLORIDE 0.9 % IV SOLN
1000.00 mg | Freq: Once | INTRAVENOUS | Status: AC
Start: 2018-01-02 — End: 2018-01-02
  Administered 2018-01-02: 1000 mg via INTRAVENOUS
  Filled 2018-01-02: qty 100

## 2018-01-02 MED ORDER — SODIUM CHLORIDE 0.9 % IV SOLN
INTRAVENOUS | Status: AC
Start: 2018-01-02 — End: 2018-01-02
  Administered 2018-01-02: 10:00:00 via INTRAVENOUS

## 2018-01-02 MED ORDER — METHYLPREDNISOLONE SODIUM SUCC 125 MG IJ SOLR
100.00 mg | Freq: Once | INTRAMUSCULAR | Status: AC
Start: 2018-01-02 — End: 2018-01-02
  Administered 2018-01-02: 100 mg via INTRAVENOUS
  Filled 2018-01-02: qty 125

## 2018-01-02 MED ORDER — ACETAMINOPHEN 325 MG PO TABS
650.00 mg | ORAL_TABLET | Freq: Once | ORAL | Status: AC
Start: 2018-01-02 — End: 2018-01-02
  Administered 2018-01-02: 650 mg via ORAL
  Filled 2018-01-02: qty 2

## 2018-01-02 MED ORDER — DIPHENHYDRAMINE HCL 50 MG/ML IJ SOLN
25.00 mg | Freq: Once | INTRAMUSCULAR | Status: AC
Start: 2018-01-02 — End: 2018-01-02
  Administered 2018-01-02: 25 mg via INTRAVENOUS
  Filled 2018-01-02: qty 1

## 2018-01-02 NOTE — Interdisciplinary (Signed)
Chemotherapy Nursing Note - [place]    Debra Bolton is a 57 year old female who presents for chemotherapy day 1 of Rituxan,   Current Chemotherapy Signed Informed Consent verified/obtained , not indicated for this diagnosis and regimen.    Pre-treatment nursing assessment:  No problems identified upon assessment.  Patient is ambulatory and in no distress.  She reported tolerating previous Rituxan  Infusion 6 months ago.  No reported fevers, chills, n/v, cough or SOB, Denied diarrhea or constipation.  Patient oriented to chair, bathroom. Asked patient to call us if f needs assistance.  PIV placed on left arm, labs drawn.    Patient met treatment parameters.    Vitals:    01/02/18 0945   BP: 142/87   BP Location: Left arm   BP Patient Position: Sitting   Pulse: 90   Resp: 16   Temp: 98 F (36.7 C)   TempSrc: Oral   SpO2: 99%   Height: 5' 4" (1.626 m)     Pain Score: NA (pre med, non-pain or scheduled)  Body surface area is 1.69 meters squared.  Body mass index is 23.93 kg/m.    Chemotherapy:  Premedication w/ tylenol, benadryl iv, methylprednisolone iv.  Medications   sodium chloride 0.9% infusion ( IntraVENOUS New Bag 01/02/18 1028)   acetaminophen (TYLENOL) tablet 650 mg (650 mg Oral Given 01/02/18 1029)   diphenhydrAMINE (BENADRYL) injection 25 mg (25 mg IntraVENOUS Given 01/02/18 1028)   methylPREDNISolone sodium succinate (SOLU-MEDROL) injection 100 mg (100 mg IntraVENOUS Given 01/02/18 1029)   riTUXimab (RITUXAN) 1,000 mg in sodium chloride 0.9 % 1,000 mL infusion (1,000 mg IntraVENOUS New Bag 01/02/18 1102)       Rituxan infused at starting rate of 50 ml/hr then 152m/hr then 200 ml/hr, 3081mhr, 400 ml/hr.  Debra Bolton.    Post blood return: Brisk  IV access post infusion: NS      Patient Bolton; Reviewed pertinent lab values with her. Discussed types of premeds she will receive.  Learner: Patient  Barriers to learning: No Barriers  Readiness to learn:  Acceptance  Method: Explanation       Treatment Bolton: Information/teaching given to patient including: signs and symptoms of infection, bleeding, adverse reaction(s), symptom control, and when to notify MD.    Debra Bolton: Instructed patient to call for assistance.    Pain Bolton: Patient instructed to contact nurse if pain should develop or if their current pain therapy becomes ineffective.    Response: Verbalizes understanding    Discharge Plan  Discharge instructions given to patient.  Future appointments:date given and reviewed with treatment plan. Returns in 2 weeks for Rituxan  Discharge Mode: Ambulatory  Discharge Time: 1500  Accompanied by: Self  Discharged To: Home

## 2018-01-04 DIAGNOSIS — I2510 Atherosclerotic heart disease of native coronary artery without angina pectoris: Secondary | ICD-10-CM | POA: Diagnosis not present

## 2018-01-04 DIAGNOSIS — I10 Essential (primary) hypertension: Secondary | ICD-10-CM | POA: Diagnosis not present

## 2018-01-04 DIAGNOSIS — I6523 Occlusion and stenosis of bilateral carotid arteries: Secondary | ICD-10-CM | POA: Diagnosis not present

## 2018-01-04 DIAGNOSIS — Z951 Presence of aortocoronary bypass graft: Secondary | ICD-10-CM | POA: Diagnosis not present

## 2018-01-04 DIAGNOSIS — R002 Palpitations: Secondary | ICD-10-CM | POA: Diagnosis not present

## 2018-01-04 DIAGNOSIS — E785 Hyperlipidemia, unspecified: Secondary | ICD-10-CM | POA: Diagnosis not present

## 2018-01-10 ENCOUNTER — Other Ambulatory Visit (HOSPITAL_BASED_OUTPATIENT_CLINIC_OR_DEPARTMENT_OTHER): Payer: Self-pay | Admitting: Neurology

## 2018-01-10 DIAGNOSIS — G35 Multiple sclerosis: Principal | ICD-10-CM

## 2018-01-10 NOTE — Telephone Encounter (Signed)
Patient is requesting refill of medication, please see orders for preloaded prescription refill of     BACLOFEN 10 MG TABLET.    Last visit with this physician:    10/19/2017  Next appointment with this physician:   04/27/2018    Request has been routed to provider

## 2018-01-12 MED ORDER — BACLOFEN 10 MG OR TABS
ORAL_TABLET | ORAL | 11 refills | Status: DC
Start: 2018-01-12 — End: 2018-11-24

## 2018-01-16 ENCOUNTER — Ambulatory Visit (HOSPITAL_BASED_OUTPATIENT_CLINIC_OR_DEPARTMENT_OTHER): Admit: 2018-01-16 | Discharge: 2018-01-16 | Disposition: A | Discharge status: Home Health | Payer: Medicare Other

## 2018-01-16 ENCOUNTER — Ambulatory Visit (INDEPENDENT_AMBULATORY_CARE_PROVIDER_SITE_OTHER): Payer: Medicare Other | Admitting: Ophthalmology

## 2018-01-16 VITALS — BP 123/85 | HR 80 | Temp 98.1°F | Resp 16 | Ht 64.0 in | Wt 139.3 lb

## 2018-01-16 DIAGNOSIS — H209 Unspecified iridocyclitis: Secondary | ICD-10-CM

## 2018-01-16 DIAGNOSIS — D8989 Other specified disorders involving the immune mechanism, not elsewhere classified: Secondary | ICD-10-CM

## 2018-01-16 DIAGNOSIS — G35 Multiple sclerosis: Secondary | ICD-10-CM

## 2018-01-16 LAB — CBC WITH DIFF, BLOOD
ANC-Automated: 3.6 10*3/uL (ref 1.6–7.0)
ANC-Instrument: 3.6 10*3/uL (ref 1.6–7.0)
Abs Basophils: 0 10*3/uL (ref <0.1)
Abs Eosinophils: 0.2 10*3/uL (ref 0.1–0.5)
Abs Lymphs: 2.3 10*3/uL (ref 0.8–3.1)
Abs Monos: 0.6 10*3/uL (ref 0.2–0.8)
Basophils: 0 %
Eosinophils: 3 %
Hct: 40.5 % (ref 34.0–45.0)
Hgb: 13.4 gm/dL (ref 11.2–15.7)
Lymphocytes: 34 %
MCH: 31.2 pg (ref 26.0–32.0)
MCHC: 33.1 g/dL (ref 32.0–36.0)
MCV: 94.4 um3 (ref 79.0–95.0)
MPV: 9.4 fL (ref 9.4–12.4)
Monocytes: 8 %
Plt Count: 253 10*3/uL (ref 140–370)
RBC: 4.29 10*6/uL (ref 3.90–5.20)
RDW: 12.4 % (ref 12.0–14.0)
Segs: 55 %
WBC: 6.6 10*3/uL (ref 4.0–10.0)

## 2018-01-16 LAB — COMPREHENSIVE METABOLIC PANEL, BLOOD
ALT (SGPT): 23 U/L (ref 0–33)
AST (SGOT): 39 U/L — ABNORMAL HIGH (ref 0–32)
Albumin: 4.9 g/dL (ref 3.5–5.2)
Alkaline Phos: 54 U/L (ref 35–140)
Anion Gap: 15 mmol/L (ref 7–15)
BUN: 23 mg/dL — ABNORMAL HIGH (ref 6–20)
Bicarbonate: 22 mmol/L (ref 22–29)
Bilirubin, Tot: 0.76 mg/dL (ref <1.2)
Calcium: 9.8 mg/dL (ref 8.5–10.6)
Chloride: 98 mmol/L (ref 98–107)
Creatinine: 0.74 mg/dL (ref 0.51–0.95)
GFR: >60 mL/min
Glucose: 90 mg/dL (ref 70–99)
Potassium: 4.6 mmol/L (ref 3.5–5.1)
Sodium: 135 mmol/L — ABNORMAL LOW (ref 136–145)
Total Protein: 7.8 g/dL (ref 6.0–8.0)

## 2018-01-16 MED ORDER — SODIUM CHLORIDE 0.9 % IV SOLN
1000.0000 mg | Freq: Once | INTRAVENOUS | Status: AC
Start: 2018-01-16 — End: 2018-01-16
  Administered 2018-01-16: 1000 mg via INTRAVENOUS
  Filled 2018-01-16: qty 100

## 2018-01-16 MED ORDER — METHYLPREDNISOLONE SODIUM SUCC 125 MG IJ SOLR CUSTOM
100.0000 mg | Freq: Once | INTRAMUSCULAR | Status: AC
Start: 2018-01-16 — End: 2018-01-16
  Administered 2018-01-16: 100 mg via INTRAVENOUS
  Filled 2018-01-16: qty 125

## 2018-01-16 MED ORDER — SODIUM CHLORIDE 0.9 % IV SOLN
INTRAVENOUS | Status: AC
Start: 2018-01-16 — End: 2018-01-16
  Administered 2018-01-16: 11:00:00 via INTRAVENOUS

## 2018-01-16 MED ORDER — ACETAMINOPHEN 325 MG PO TABS
650.0000 mg | ORAL_TABLET | Freq: Once | ORAL | Status: AC
Start: 2018-01-16 — End: 2018-01-16
  Administered 2018-01-16: 650 mg via ORAL
  Filled 2018-01-16: qty 2

## 2018-01-16 MED ORDER — DIPHENHYDRAMINE HCL 50 MG/ML IJ SOLN
25.0000 mg | Freq: Once | INTRAMUSCULAR | Status: AC
Start: 2018-01-16 — End: 2018-01-16
  Administered 2018-01-16: 25 mg via INTRAVENOUS
  Filled 2018-01-16: qty 1

## 2018-01-16 NOTE — Interdisciplinary (Signed)
Chemotherapy Nursing Note - [place]    Debra Bolton is a 57 year old female who presents for Rituxan         Pre-treatment nursing assessment:  Patient is ambulatory and in no distress.  She feels well today and denies report of fevers, chills, cough or SOB.  She stated that when she received the Rituxan previously she had felt some SOB towards the end of the infusion. She didn't tell the nurse as she thought it could be her asthma and she needed her inhaler.  She stated that she was in a rush to go home.  Explained to patient that its important to tell us if she was feeling any problems during infusion and that we need to assess if she was having a reaction. She verbalized understanding.    Vitals:    01/16/18 0916   BP: 123/85   BP Location: Right arm   BP Patient Position: Sitting   Pulse: 80   Resp: 16   Temp: 98.1 F (36.7 C)   TempSrc: Oral   SpO2: 99%   Weight: 63.2 kg (139 lb 4.8 oz)   Height: 5\' 4"  (1.626 m)     Pain Score: NA (pre med, non-pain or scheduled)  Body surface area is 1.69 meters squared.  Body mass index is 23.91 kg/m.    Chemotherapy:  Medications   sodium chloride 0.9% infusion ( IntraVENOUS Completed 01/16/18 1500)   acetaminophen (TYLENOL) tablet 650 mg (650 mg Oral Given 01/16/18 1028)   diphenhydrAMINE (BENADRYL) injection 25 mg (25 mg IntraVENOUS Given 01/16/18 1036)   methylPREDNISolone sodium succinate (SOLU-MEDROL) injection 100 mg (100 mg IntraVENOUS Given 01/16/18 1036)   riTUXimab (RITUXAN) 1,000 mg in sodium chloride 0.9 % 1,000 mL infusion (0 mg IntraVENOUS Completed 01/16/18 1437)     Rituxan infused at titrated rate starting at 100 ml/hr and rate changed every 30 minutes to max of 475ml/hr completed without complications.  Debra Bolton tolerated treatment well.    Post blood return: Brisk  IV access post infusion: NS   dc piv    Patient Education Reinforced informing staff of her symptoms.  Learner: Patient  Barriers to learning: No Barriers  Readiness to learn:  Acceptance  Method: Explanation                  Treatment Education: Information/teaching given to patient including: signs and symptoms of infection, bleeding, adverse reaction(s), symptom control, and when to notify MD.    [name]: Instructed patient to call for assistance.    Pain Education: Patient instructed to contact nurse if pain should develop or if their current pain therapy becomes ineffective.    Response: Verbalizes understanding    Discharge Plan  Discharge instructions given to patient.  Future appointments:date given and reviewed with treatment plan.  Discharge Mode: Ambulatory  Discharge Time: Bowler  Accompanied by: Self  Discharged To: Home

## 2018-01-20 NOTE — Addendum Note (Signed)
Encounter addended by: Isabella Bowens on: 01/20/2018 10:44 AM   Actions taken: Visit Navigator Flowsheet section accepted

## 2018-01-24 ENCOUNTER — Other Ambulatory Visit (HOSPITAL_BASED_OUTPATIENT_CLINIC_OR_DEPARTMENT_OTHER): Payer: Self-pay | Admitting: Neurology

## 2018-01-24 DIAGNOSIS — Z79891 Long term (current) use of opiate analgesic: Secondary | ICD-10-CM | POA: Diagnosis not present

## 2018-01-24 DIAGNOSIS — G5600 Carpal tunnel syndrome, unspecified upper limb: Secondary | ICD-10-CM | POA: Diagnosis not present

## 2018-01-24 DIAGNOSIS — G894 Chronic pain syndrome: Secondary | ICD-10-CM | POA: Diagnosis not present

## 2018-01-24 DIAGNOSIS — M546 Pain in thoracic spine: Secondary | ICD-10-CM | POA: Diagnosis not present

## 2018-01-24 DIAGNOSIS — F1120 Opioid dependence, uncomplicated: Secondary | ICD-10-CM | POA: Diagnosis not present

## 2018-01-24 DIAGNOSIS — M5136 Other intervertebral disc degeneration, lumbar region: Secondary | ICD-10-CM | POA: Diagnosis not present

## 2018-01-24 DIAGNOSIS — Z8781 Personal history of (healed) traumatic fracture: Secondary | ICD-10-CM | POA: Diagnosis not present

## 2018-01-24 DIAGNOSIS — Z720 Tobacco use: Secondary | ICD-10-CM | POA: Diagnosis not present

## 2018-01-24 DIAGNOSIS — M5090 Cervical disc disorder, unspecified, unspecified cervical region: Secondary | ICD-10-CM | POA: Diagnosis not present

## 2018-01-24 DIAGNOSIS — G35 Multiple sclerosis: Secondary | ICD-10-CM

## 2018-01-24 DIAGNOSIS — F339 Major depressive disorder, recurrent, unspecified: Secondary | ICD-10-CM

## 2018-01-24 NOTE — Telephone Encounter (Signed)
ROUTED TO MD    Prescription Refill Request     Requested Medications:DULoxetine (CYMBALTA) 60 MG CR capsule    Last Refill: 12-29-17    Send to:   [id number] - [place], [place]

## 2018-01-24 NOTE — Telephone Encounter (Signed)
Received a 90 day supply request for authorization via fax from [facility] [phone] for DULoxetine (CYMBALTA) 60 MG CR capsule. Fax forwarded to clinic. Please note.    Prescribing provider: Arelia Sneddon, MD    Last Visit Date:  10/19/17     Next Visit Date:  04/27/18

## 2018-01-25 NOTE — Telephone Encounter (Signed)
Prescription Refill Request  For 90 days    Requested Medications:DULoxetine (CYMBALTA) 60 MG CR capsule    Last visit: 10/19/17  Next visit: 04/27/2018    Send to:   [id number] - [place], [place] - [address]    90 day supply script pended and routed to MD

## 2018-01-27 MED ORDER — DULOXETINE HCL 60 MG OR CPEP
120.0000 mg | ORAL_CAPSULE | Freq: Every day | ORAL | 3 refills | Status: DC
Start: 2018-01-27 — End: 2018-11-24

## 2018-02-06 ENCOUNTER — Ambulatory Visit (INDEPENDENT_AMBULATORY_CARE_PROVIDER_SITE_OTHER): Payer: Medicare Other | Admitting: Ophthalmology

## 2018-02-15 ENCOUNTER — Encounter (HOSPITAL_BASED_OUTPATIENT_CLINIC_OR_DEPARTMENT_OTHER): Payer: Self-pay | Admitting: Neurology

## 2018-02-15 ENCOUNTER — Encounter (INDEPENDENT_AMBULATORY_CARE_PROVIDER_SITE_OTHER): Payer: Self-pay | Admitting: Internal Medicine

## 2018-02-15 ENCOUNTER — Encounter (INDEPENDENT_AMBULATORY_CARE_PROVIDER_SITE_OTHER): Payer: Self-pay | Admitting: Family Practice

## 2018-02-15 NOTE — Telephone Encounter (Signed)
From: [name]  To: Johny Shears, MD  Sent: 02/15/2018 2:17 PM PDT  Subject: 1-Non Urgent Medical Advice    Dear Dr. Candiss Norse.    Two questions,   First I have been unusually depressed, anxious, and or lethargic, can this be a side effect of the rituxan infusions?  Although I have struggled with this off and on, I have usually been able to work around it and thrive. This last month I have been told by 3 important people in my life that something isn't quite right. I feel like I'm just in a gray fog.    Also had a pretty bad cold the past 2 weeks, and I am due for a flu shot, any concerns over this right after infusions.    Just trying to stay on the healthiest path, thanks for any clarification.    Thanks [name]

## 2018-02-15 NOTE — Telephone Encounter (Signed)
From: Debra Bolton  To: Christin Bach, MD  Sent: 02/15/2018 2:21 PM PDT  Subject: 1-Non Urgent Medical Advice    Dear Dr. Rana Snare,    I also sent this to Dr. Candiss Norse and Dr. Rosendo Gros:    Two questions,  First I have been unusually depressed, anxious, and or lethargic, can this be a side effect of the rituxan infusions?  Although I have struggled with this off and on, I have usually been able to work around it and thrive. This last month I have been told by 3 important people in my life that something isn't quite right. I feel like I'm just in a gray fog.    Also had a pretty bad cold the past 2 weeks, and I am due for a flu shot, any concerns over this right after infusions.    Just trying to stay on the healthiest path, thanks for any clarification.   Thanks Debra Bolton

## 2018-02-16 NOTE — Telephone Encounter (Signed)
From: Debra Bolton  To: [place] Kristen Loader, MD  Sent: 02/15/2018 2:24 PM PDT  Subject: 1-Non Urgent Medical Advice    I also sent this to Dr. Candiss Norse and Dr.Edi:    Two questions,  First I have been unusually depressed, anxious, and or lethargic, can this be a side effect of the rituxan infusions?  Although I have struggled with this off and on, I have usually been able to work around it and thrive. This last month I have been told by 3 important people in my life that something isn't quite right. I feel like I'm just in a gray fog.    Also had a pretty bad cold the past 2 weeks, and I am due for a flu shot, any concerns over this right after infusions.    Just trying to stay on the healthiest path, thanks for any clarification.  Thanks Debra Bolton

## 2018-02-23 DIAGNOSIS — M5090 Cervical disc disorder, unspecified, unspecified cervical region: Secondary | ICD-10-CM | POA: Diagnosis not present

## 2018-02-23 DIAGNOSIS — Z79891 Long term (current) use of opiate analgesic: Secondary | ICD-10-CM | POA: Diagnosis not present

## 2018-02-23 DIAGNOSIS — Z8781 Personal history of (healed) traumatic fracture: Secondary | ICD-10-CM | POA: Diagnosis not present

## 2018-02-23 DIAGNOSIS — G5600 Carpal tunnel syndrome, unspecified upper limb: Secondary | ICD-10-CM | POA: Diagnosis not present

## 2018-02-23 DIAGNOSIS — M5136 Other intervertebral disc degeneration, lumbar region: Secondary | ICD-10-CM | POA: Diagnosis not present

## 2018-02-23 DIAGNOSIS — F1120 Opioid dependence, uncomplicated: Secondary | ICD-10-CM | POA: Diagnosis not present

## 2018-02-23 DIAGNOSIS — Z720 Tobacco use: Secondary | ICD-10-CM | POA: Diagnosis not present

## 2018-02-23 DIAGNOSIS — M546 Pain in thoracic spine: Secondary | ICD-10-CM | POA: Diagnosis not present

## 2018-02-23 DIAGNOSIS — G894 Chronic pain syndrome: Secondary | ICD-10-CM | POA: Diagnosis not present

## 2018-03-06 ENCOUNTER — Ambulatory Visit (INDEPENDENT_AMBULATORY_CARE_PROVIDER_SITE_OTHER): Payer: Medicare Other | Admitting: Ophthalmology

## 2018-03-09 ENCOUNTER — Telehealth (INDEPENDENT_AMBULATORY_CARE_PROVIDER_SITE_OTHER): Payer: Self-pay | Admitting: Family Practice

## 2018-03-09 DIAGNOSIS — E039 Hypothyroidism, unspecified: Secondary | ICD-10-CM

## 2018-03-09 DIAGNOSIS — Z0000 Encounter for general adult medical examination without abnormal findings: Principal | ICD-10-CM

## 2018-03-09 NOTE — Telephone Encounter (Signed)
Debra Bolton, CPhT  ([facility])      Hypothyroid Medication Refill Protocol    Last visit: 11/18/2017  F/u results hypothyroidism    Last pertinent visit: 11/18/17  Next f/u appt due:  2-3 mo 02/18/18 f/u breathing  Next scheduled appointment: None w/ PCP    Per OV note on 11/18/17:  Hypothyroidism- she is taking 75 mg of armour and libido is a little better    A/P:  Hypothyroidism, unspecified type E03.9 244.9 TSH, Blood - See Instructions     Please get your labs done in 3 months  I will let you know your results once they are back  Please see me for follow up in 2-3 months    **If no recent dose changes, and last TSH (and T4 for Liothyronine only) normal/within date, no recent notes will be reviewed per Pharmacy Dept protocol**      LABS required:  (Q year TSH)  *If recent dose change: check TSH 2 months after starting new  dose.    Lab Results   Component Value Date    TSH 0.21 (L) 12/27/2017    FREET4 0.86 (L) 08/10/2017     Per mychart 12/28/17        Monitoring required:  (Q year BP, HR, Wt)   *If pt is pregnant, DEFER TO MD*    (BP range: PZWCHENI=77-824  MPNTIRWER=15-40)  Blood Pressure   01/16/18 123/85   01/02/18 137/86   12/27/17 116/76       (HR range: 55-110)   Pulse Readings from Last 3 Encounters:   01/16/18 80   01/02/18 89   12/27/17 91       Wt Readings from Last 3 Encounters:   01/16/18 63.2 kg (139 lb 4.8 oz)   12/27/17 63.2 kg (139 lb 6.4 oz)   11/18/17 63.5 kg (139 lb 15.9 oz)         Hypothyroid Medication Refill Protocol    Last visit: 11/18/2017  F/u results hypothyroidism    Last pertinent visit: 11/18/17  Next f/u appt due:  2-3 mo 02/18/18 f/u breathing  Next scheduled appointment: None w/ PCP      **If no recent dose changes, and last TSH (and T4 for Liothyronine only) normal/within date, no recent notes will be reviewed per Pharmacy Dept protocol**      LABS required:  (Q year TSH)  *If recent dose change: check TSH 2 months after starting new  dose.    Lab Results   Component Value  Date    TSH 0.21 (L) 12/27/2017    FREET4 0.86 (L) 08/10/2017         Monitoring required:  (Q year BP, HR, Wt)   *If pt is pregnant, DEFER TO MD*    (BP range: GQQPYPPJ=09-326  ZTIWPYKDX=83-38)  Blood Pressure   01/16/18 123/85   01/02/18 137/86   12/27/17 116/76       (HR range: 55-110)   Pulse Readings from Last 3 Encounters:   01/16/18 80   01/02/18 89   12/27/17 91       Wt Readings from Last 3 Encounters:   01/16/18 63.2 kg (139 lb 4.8 oz)   12/27/17 63.2 kg (139 lb 6.4 oz)   11/18/17 63.5 kg (139 lb 15.9 oz)

## 2018-03-10 MED ORDER — THYROID 60 MG OR TABS
60.00 mg | ORAL_TABLET | Freq: Every day | ORAL | 0 refills | Status: DC
Start: 2018-03-10 — End: 2018-10-06

## 2018-03-10 MED ORDER — THYROID 15 MG OR TABS
15.00 mg | ORAL_TABLET | ORAL | 0 refills | Status: DC
Start: 2018-03-10 — End: 2018-06-06

## 2018-03-10 NOTE — Telephone Encounter (Signed)
Patient takes 60mg  qd and 15mg  qod- changed sig to reflect.

## 2018-03-10 NOTE — Telephone Encounter (Signed)
MyChart sent.

## 2018-03-10 NOTE — Telephone Encounter (Signed)
Lab Reminder:    Please remind pt to have labs drawn (non-fasting).     Authorized 90 days supply + 0 RF until labs complete.       Thanks,    [facility]   Refill and Prior Auth Clinical Services  Phone:  [phone]  Ext:  [phone]

## 2018-03-13 ENCOUNTER — Ambulatory Visit (INDEPENDENT_AMBULATORY_CARE_PROVIDER_SITE_OTHER): Payer: Medicare Other | Admitting: Ophthalmology

## 2018-03-13 DIAGNOSIS — H53003 Unspecified amblyopia, bilateral: Secondary | ICD-10-CM

## 2018-03-13 DIAGNOSIS — Z135 Encounter for screening for eye and ear disorders: Secondary | ICD-10-CM

## 2018-03-13 DIAGNOSIS — H2513 Age-related nuclear cataract, bilateral: Secondary | ICD-10-CM

## 2018-03-17 ENCOUNTER — Other Ambulatory Visit (INDEPENDENT_AMBULATORY_CARE_PROVIDER_SITE_OTHER): Payer: Self-pay | Admitting: Psychiatric/Mental Health

## 2018-03-17 DIAGNOSIS — R252 Cramp and spasm: Principal | ICD-10-CM

## 2018-03-18 ENCOUNTER — Encounter (INDEPENDENT_AMBULATORY_CARE_PROVIDER_SITE_OTHER): Payer: Self-pay | Admitting: Psychiatric/Mental Health

## 2018-03-20 NOTE — Telephone Encounter (Signed)
From: Debra Bolton  To: Areta Haber, NP  Sent: 03/18/2018 12:33 PM PST  Subject: 1-Non Urgent Medical Advice    Dear [name],     It has been forever since we talked and updated my records and medicines. Can we schedule an appt. ?  I am also currently out of mt tizanidine and sent you a request to call my pharmacy. Please let me know when we can schedule something.    Thanks for your attention, see you soon.  Debra Bolton

## 2018-03-21 MED ORDER — TIZANIDINE HCL 4 MG OR TABS
6.00 mg | ORAL_TABLET | Freq: Every evening | ORAL | 0 refills | Status: DC
Start: 2018-03-21 — End: 2018-04-20

## 2018-03-21 NOTE — Telephone Encounter (Signed)
Refill request from patient for Tizanidine 4mg  #45 1.5tabs po qhs.     Last visit: 03/29/2018  No follow up visit as of today, messaged patient via MyChart to schedule appt.     [phone] [place] [place]

## 2018-03-24 DIAGNOSIS — I6523 Occlusion and stenosis of bilateral carotid arteries: Secondary | ICD-10-CM | POA: Diagnosis not present

## 2018-03-24 DIAGNOSIS — E785 Hyperlipidemia, unspecified: Secondary | ICD-10-CM | POA: Diagnosis not present

## 2018-03-24 DIAGNOSIS — R0789 Other chest pain: Secondary | ICD-10-CM | POA: Diagnosis not present

## 2018-03-24 DIAGNOSIS — I10 Essential (primary) hypertension: Secondary | ICD-10-CM | POA: Diagnosis not present

## 2018-03-24 DIAGNOSIS — Z951 Presence of aortocoronary bypass graft: Secondary | ICD-10-CM | POA: Diagnosis not present

## 2018-03-24 DIAGNOSIS — I2510 Atherosclerotic heart disease of native coronary artery without angina pectoris: Secondary | ICD-10-CM | POA: Diagnosis not present

## 2018-03-24 DIAGNOSIS — R002 Palpitations: Secondary | ICD-10-CM | POA: Diagnosis not present

## 2018-03-27 DIAGNOSIS — Z720 Tobacco use: Secondary | ICD-10-CM | POA: Diagnosis not present

## 2018-03-27 DIAGNOSIS — F1120 Opioid dependence, uncomplicated: Secondary | ICD-10-CM | POA: Diagnosis not present

## 2018-03-27 DIAGNOSIS — G894 Chronic pain syndrome: Secondary | ICD-10-CM | POA: Diagnosis not present

## 2018-03-27 DIAGNOSIS — G5600 Carpal tunnel syndrome, unspecified upper limb: Secondary | ICD-10-CM | POA: Diagnosis not present

## 2018-03-27 DIAGNOSIS — Z8781 Personal history of (healed) traumatic fracture: Secondary | ICD-10-CM | POA: Diagnosis not present

## 2018-03-27 DIAGNOSIS — M5090 Cervical disc disorder, unspecified, unspecified cervical region: Secondary | ICD-10-CM | POA: Diagnosis not present

## 2018-03-27 DIAGNOSIS — M546 Pain in thoracic spine: Secondary | ICD-10-CM | POA: Diagnosis not present

## 2018-03-27 DIAGNOSIS — M5136 Other intervertebral disc degeneration, lumbar region: Secondary | ICD-10-CM | POA: Diagnosis not present

## 2018-03-27 DIAGNOSIS — Z79891 Long term (current) use of opiate analgesic: Secondary | ICD-10-CM | POA: Diagnosis not present

## 2018-03-28 ENCOUNTER — Other Ambulatory Visit: Payer: Self-pay

## 2018-03-28 ENCOUNTER — Ambulatory Visit (INDEPENDENT_AMBULATORY_CARE_PROVIDER_SITE_OTHER): Payer: Medicare Other | Admitting: Internal Medicine

## 2018-03-28 ENCOUNTER — Encounter (INDEPENDENT_AMBULATORY_CARE_PROVIDER_SITE_OTHER): Payer: Self-pay | Admitting: Internal Medicine

## 2018-03-28 ENCOUNTER — Other Ambulatory Visit (INDEPENDENT_AMBULATORY_CARE_PROVIDER_SITE_OTHER): Payer: Medicare Other | Attending: Internal Medicine

## 2018-03-28 VITALS — BP 100/63 | HR 77 | Temp 98.4°F | Ht 64.0 in | Wt 135.0 lb

## 2018-03-28 DIAGNOSIS — G35 Multiple sclerosis: Secondary | ICD-10-CM | POA: Insufficient documentation

## 2018-03-28 DIAGNOSIS — H209 Unspecified iridocyclitis: Secondary | ICD-10-CM | POA: Insufficient documentation

## 2018-03-28 DIAGNOSIS — Z79899 Other long term (current) drug therapy: Secondary | ICD-10-CM

## 2018-03-28 DIAGNOSIS — D8989 Other specified disorders involving the immune mechanism, not elsewhere classified: Principal | ICD-10-CM | POA: Insufficient documentation

## 2018-03-28 DIAGNOSIS — Z0000 Encounter for general adult medical examination without abnormal findings: Secondary | ICD-10-CM | POA: Insufficient documentation

## 2018-03-28 DIAGNOSIS — E039 Hypothyroidism, unspecified: Secondary | ICD-10-CM | POA: Insufficient documentation

## 2018-03-28 DIAGNOSIS — M7551 Bursitis of right shoulder: Secondary | ICD-10-CM

## 2018-03-28 DIAGNOSIS — R1013 Epigastric pain: Secondary | ICD-10-CM

## 2018-03-28 LAB — CBC WITH DIFF, BLOOD
ANC-Automated: 3.5 10*3/uL (ref 1.6–7.0)
Abs Basophils: 0 10*3/uL (ref <0.1)
Abs Eosinophils: 0.2 10*3/uL (ref 0.1–0.5)
Abs Lymphs: 2.4 10*3/uL (ref 0.8–3.1)
Abs Monos: 0.5 10*3/uL (ref 0.2–0.8)
Basophils: 0 %
Eosinophils: 2 %
Hct: 39.6 % (ref 34.0–45.0)
Hgb: 13.1 gm/dL (ref 11.2–15.7)
Lymphocytes: 37 %
MCH: 30.7 pg (ref 26.0–32.0)
MCHC: 33.1 g/dL (ref 32.0–36.0)
MCV: 92.7 um3 (ref 79.0–95.0)
MPV: 9.9 fL (ref 9.4–12.4)
Monocytes: 8 %
Plt Count: 283 10*3/uL (ref 140–370)
RBC: 4.27 10*6/uL (ref 3.90–5.20)
RDW: 11.7 % — ABNORMAL LOW (ref 12.0–14.0)
Segs: 53 %
WBC: 6.7 10*3/uL (ref 4.0–10.0)

## 2018-03-28 LAB — COMPREHENSIVE METABOLIC PANEL, BLOOD
ALT (SGPT): 17 U/L (ref 0–33)
AST (SGOT): 25 U/L (ref 0–32)
Albumin: 4.6 g/dL (ref 3.5–5.2)
Alkaline Phos: 49 U/L (ref 35–140)
Anion Gap: 13 mmol/L (ref 7–15)
BUN: 17 mg/dL (ref 6–20)
Bicarbonate: 26 mmol/L (ref 22–29)
Bilirubin, Tot: 0.41 mg/dL (ref <1.2)
Calcium: 10.1 mg/dL (ref 8.5–10.6)
Chloride: 101 mmol/L (ref 98–107)
Creatinine: 0.77 mg/dL (ref 0.51–0.95)
GFR: >60 mL/min
Glucose: 80 mg/dL (ref 70–99)
Potassium: 4.1 mmol/L (ref 3.5–5.1)
Sodium: 140 mmol/L (ref 136–145)
Total Protein: 7.3 g/dL (ref 6.0–8.0)

## 2018-03-28 LAB — TSH, BLOOD: TSH: 0.26 u[IU]/mL — ABNORMAL LOW (ref 0.27–4.20)

## 2018-03-28 LAB — AMYLASE, BLOOD: Amylase: 61 U/L (ref 28–100)

## 2018-03-28 LAB — SED RATE, BLOOD: Sed Rate: 7 mm/hr (ref 0–30)

## 2018-03-28 LAB — LIPASE, BLOOD: Lipase: 20 U/L (ref 13–60)

## 2018-03-28 LAB — IGE, BLOOD: IGE: 64 [IU]/mL (ref 0–99)

## 2018-03-28 LAB — C-REACTIVE PROTEIN, BLOOD: CRP: 0.04 mg/dL (ref <0.5)

## 2018-03-28 NOTE — Progress Notes (Addendum)
Referring Provider:  Edi, Verl Blalock, MD  [address] / [place]  [place], [place] [address]    Chief Complaint  Chief Complaint   Patient presents with   . Follow Up   1. Multiple Sclerosis, relapsing form  2. Anterior uveitis  3. IgG related lymphadenopathy, chronic sialoadenitis    History of present Illness  57 year old female with history of multiple sclerosis diagnosed 1991, presented with numbness in both legs, seizures of 4 years duration.  Multiple relapses since then.  Previously treated with Copaxone.  Started Natalizumab in 2014 with good response and improvement in pain and numbness in the extremities.  Currently taking Cymbalta and gabapentin which appear to help with pain in her extremities.    History of anterior uveitis for 1 year, treated with topical corticosteroids.  Seen by Ophthalmology at [facility].  Previous labs from [place] from April 2017 revealed normal SSA, SSB, QuantiFERON TB, cell counts, CMP, inflammatory markers.    She is a chronic heavy smoker, about a year ago she had change in voice since cervical lymphadenopathy.  Underwent removal of enlarged tonsil and cervical lymph node to rule out malignancy.  Biopsy was negative for malignancy, however suggested IgG4 related disease with 50 cells per high-power field in lymph node with IgG4 staining.  Workup for systemic disease was done with CT chest abdomen pelvis 08/11/2015 which was negative for lymphadenopathy and for retroperitoneal fibrosis.  No pancreatitis.  Neck ultrasound showed bilateral submandibular gland enlargement with heterogeneous echotexture, likely secondary to IgG4 related disease    Switched from natalizumab to rituximab for multiple sclerosis, in light of history of uveitis and IgG4 related disease, by Neurology.  Received rituximab 500 mg IV 2 doses 2 weeks apart in March 2019 and September 2019    Had issues with depressed mood after rituximab, resolved after a few weeks.  Overall feels better with respect to  energy levels, submandibular pain and swelling.  Currently notes intermittent epigastric pain to the right of midline, usually after meals.  Resolved spontaneously. No change in bowel movements.  No vomiting.  No new neurologic symptoms.     Recurrent right subacromial bursitis status post bursa injection on multiple occasions with good response.  Currently has worsening right shoulder pain, requests repeat steroid injection. Doing yoga regularly, which has helped her pain tremendously.  No other joints with pain swelling or stiffness.    No unexpected appetite or weight changes.  No persistent fever.      Skin: No photosensitivity, rash or ulceration. No skin psoriasis  Hair: No unusual hair loss  Oral: No oral ulceration or sore throat   Eyes:  Per HPI  Ears: No changes in hearing, ear pain or discharge  Abd: No discomfort. No change in bowel habits  Resp: No difficulty breathing, chest pain or chronic cough  Genitourinary: No difficulty or pain with urination  Systemic: No fever, malaise, changes in weight or appetite. No h/o recurrent infections  Neurological:  Per HPI  Extremities: No h/o Raynaud's phenomenon. No other joints with swelling, pain or redness    Review of Systems  As per HPI    Medications and Allergies were reviewed with the patient.       Current Outpatient Medications:   .  albuterol (VENTOLIN HFA) 108 (90 Base) MCG/ACT inhaler, Inhale 2 puffs by mouth every 6 hours as needed for Wheezing., Disp: 3 Inhaler, Rfl: 3  .  Amantadine HCl 100 MG tablet, Take 100  mg by mouth 2 times daily., Disp: 60 tablet, Rfl: 0  .  baclofen (LIORESAL) 10 MG tablet, TAKE 1 TABLET BY MOUTH THREE TIMES A DAY, Disp: 90 tablet, Rfl: 11  .  diazepam (VALIUM) 5 MG tablet, Take 1 tablet (5 mg) by mouth once a week., Disp: , Rfl:   .  DULoxetine (CYMBALTA) 60 MG CR capsule, Take 2 capsules (120 mg) by mouth daily., Disp: 180 capsule, Rfl: 3  .  gabapentin (NEURONTIN) 300 MG capsule, Take 2 capsules (600 mg) by mouth  nightly., Disp: , Rfl:   .  modafinil (PROVIGIL) 200 MG tablet, Take 1 tablet (200 mg) by mouth every morning., Disp: 30 tablet, Rfl: 0  .  nortriptyline (PAMELOR) 25 MG capsule, Take 2 capsules (50 mg) by mouth nightly., Disp: 60 capsule, Rfl: 5  .  prednisoLONE (ORAPRED) 15 MG/5ML solution, Take 15 mg by mouth daily., Disp: , Rfl:   .  prednisoLONE acetate (PRED FORTE) 1 % ophthalmic suspension, Place 1 drop into both eyes 4 times daily., Disp: 1 bottle, Rfl: 3  .  thyroid (ARMOUR THYROID) 15 MG tablet, Take 1 tablet (15 mg) by mouth every other day., Disp: 45 tablet, Rfl: 0  .  thyroid (ARMOUR THYROID) 60 MG tablet, Take 1 tablet (60 mg) by mouth daily., Disp: 90 tablet, Rfl: 0  .  tiZANidine (ZANAFLEX) 4 MG tablet, Take 1.5 tablets (6 mg) by mouth at bedtime., Disp: 45 tablet, Rfl: 0    Allergies  Penicillins and Modafinil    Past Medical History:   Diagnosis Date   . Asthma 1991    with pregnancy   . Hypothyroidism    . Major depressive disorder, single episode    . MS (multiple sclerosis) (CMS-HCC)        Past Surgical History:   Procedure Laterality Date   . leep      for cervical dysplasia   . LYMPH NODE BIOPSY      IgG4 related disease   . TONSILLECTOMY AND ADENOIDECTOMY      only tonsillectomy       Family History   Problem Relation Name Age of Onset   . Other Mother Zara Chess    . Thyroid Mother Zara Chess    . Cancer Father don mc clain         lung   . Cancer Brother     . Cancer Sister Zara Chess         breast   . Diabetes Sister suzanne mcclain    . Stroke Brother Jeremy Johann        Social History     Socioeconomic History   . Marital status: Divorced     Spouse name: Not on file   . Number of children: Not on file   . Years of education: Not on file   . Highest education level: Not on file   Occupational History   . Not on file   Social Needs   . Financial resource strain: Not on file   . Food insecurity:     Worry: Not on file     Inability: Not on file   . Transportation needs:     Medical:  Not on file     Non-medical: Not on file   Tobacco Use   . Smoking status: Former Smoker     Packs/day: 1.00     Years: 26.00     Pack years: 26.00     Types:  Cigarettes     Last attempt to quit: 02/01/2016     Years since quitting: 2.1   . Smokeless tobacco: Never Used   Substance and Sexual Activity   . Alcohol use: Yes     Alcohol/week: 16.7 standard drinks     Types: 4 Glasses of wine per week   . Drug use: Not on file   . Sexual activity: Yes     Partners: Male   Lifestyle   . Physical activity:     Days per week: Not on file     Minutes per session: Not on file   . Stress: Not on file   Relationships   . Social connections:     Talks on phone: Not on file     Gets together: Not on file     Attends religious service: Not on file     Active member of club or organization: Not on file     Attends meetings of clubs or organizations: Not on file     Relationship status: Not on file   . Intimate partner violence:     Fear of current or ex partner: Not on file     Emotionally abused: Not on file     Physically abused: Not on file     Forced sexual activity: Not on file   Other Topics Concern   . Military Service Not Asked   . Blood Transfusions Not Asked   . Caffeine Concern Not Asked   . Occupational Exposure Not Asked   . Hobby Hazards Not Asked   . Sleep Concern Not Asked   . Stress Concern Not Asked   . Weight Concern Not Asked   . Special Diet Not Asked   . Back Care Not Asked   . Exercise Not Asked   . Bike Helmet Not Asked   . Seat Belt Not Asked   . Self-Exams Not Asked   Social History Narrative    Moved from [place], high fiber, she is very conscientious of effect of diet on health.    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    Hasn't had a cigarette in 8 months.    Has smoked 26 years, less than a pack a day.    Currently drinks 2-4 drinks a day on the weekends, total 4-8 per week.    Sexually active with one female partner.    Currently not working, has been on disability for most of the time from 1992  to now.         Physical Exam  Vitals:    03/28/18 1421   BP: 100/63   BP Location: Left arm   BP Patient Position: Sitting   BP cuff size: Regular   Pulse: 77   Temp: 98.4 F (36.9 C)   TempSrc: Oral   Weight: 61.2 kg (135 lb)   Height: 5' 4"  (1.626 m)     Gen: Alert, cooperative  Psych: Appropriate affect  Neck:  Prominent submandibular and parotid glands.  No discrete lymph nodes palpable  Skin: No rheumatologic rashes or ulcers  Eyes: No conjunctival erythema, EOMI. No scleral icterus  ENT: No oral mucositis, no sinus tenderness, no ear discharge  CV: Regular rhythm, normal rate  Resp: Breathing comfortably, no tachypnea  Abd: Soft, mild epigastric tenderness.  Joint exam: A complete joint exam revealed pain or disease state abduction at right shoulder. No active synovitis in the remaining upper or lower  extremity joints. Good range of motion in shoulder and pelvic girdles  Neuro: Oriented to time, place and person         Most recent labs and imaging were reviewed and discussed with the patient.  50 pages of outside records reviewed, outlined in HPI    08/08/2015  IgG4 level 215 upper limit 142m/dl  Total IgG 1050 upper limit 1590    01/12/17  IgG4 level 193  03/29/17:  IgG4 elevated at 205.  Normal IgG    Impression    ICD-10-CM ICD-9-CM   1. 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 Multiple sclerosis (CMS-HCC) G35 340   3. Uveitis of right eye H20.9 364.3   4. High risk medication use Z79.899 V58.69   5. Subacromial bursitis of right shoulder joint M75.51 726.19   6. Epigastric pain R10.13 789.06       Rosaura KFolzis a 57year old female with multiple sclerosis, followed by Neurology.  Previously well controlled on treatment with natalizumab.    Switch to rituximab in March 2019 due to history of IgG4 related disease, with cervical lymphadenopathy, submandibular gland enlargement and recurrent uveitis of right eye.  Chronic nerve pain well controlled with Cymbalta and gabapentin.    IgG4 related  disease on histopathology of enlarged right cervical lymph nodes.  No pancreatitis, no other systemic lymphadenopathy on CT chest abdomen pelvis performed April 2017.    History 2 episodes of anterior uveitis in the last 1 year.  Negative ANA HLA B27, Qft TB    Last rituximab September 2019.  Clinically stable, no worsening lymphadenopathy.  Epigastric pain unclear etiology, possibly originating from gallbladder     PLAN:  - consider repeat rituximab in 6 months from September for IgG4 related disease, MS and recurrent uveitis  - labs as ordered below  - ultrasound abdomen to assess for etiology of epigastric pain, consider GI referral       Orders Placed This Encounter   Procedures   . UKoreaAbdomen Limited   . CBC w/ Diff Lavender   . Comprehensive Metabolic Panel Green Plasma Separator Tube   . C-Reactive Protein, Blood Green Plasma Separator Tube   . Sedimentation Rate (ESR), Blood Lavender   . IgG Subclass, Blood Yellow serum separator tube   . IgE, Blood - See Instructions   . Amylase, Blood Green Plasma Separator Tube   . Lipase, Blood Green Plasma Separator Tube        Requested Prescriptions      No prescriptions requested or ordered in this encounter       Return in about 2 months (around 05/29/2018).     Procedure note:  Right subacromial bursa steroid injection  Risks and benefits of procedure discussed with patient. Written and verbal, witnessed informed consent was obtained. A time out period was observed to identify the correct patient, side and site.   After palpation exam, area was marked for injection and thoroughly cleaned with chloraprep.   Ethyl chloride used for local anesthesia prior to procedure. Under sterile precautions, 40 mg of Depo-Medrol mixed with 2 cc xylocaine injected into the right subacromial bursa. Bandaid applied. Patient tolerated the procedure well, no post-procedure complications. Advised to avoid soaking in water and avoidance of excessive activity involving the joint for 24  hrs.

## 2018-03-29 ENCOUNTER — Encounter (INDEPENDENT_AMBULATORY_CARE_PROVIDER_SITE_OTHER): Payer: Self-pay | Admitting: Family Practice

## 2018-03-29 LAB — TOTAL B CELL (CD19), BLOOD
CD19 B-Cell %: <1 % — ABNORMAL LOW (ref 6–23)
CD19 B-Cell Abs: <26 cells/uL — ABNORMAL LOW (ref 50–350)

## 2018-03-30 DIAGNOSIS — I6523 Occlusion and stenosis of bilateral carotid arteries: Secondary | ICD-10-CM | POA: Diagnosis not present

## 2018-03-30 LAB — IGG SUBCLASS PANEL, BLOOD
IGG Subclass 1: 539 mg/dL (ref 240–1118)
IGG Subclass 2: 360 mg/dL (ref 124–549)
IGG Subclass 3: 16 mg/dL — ABNORMAL LOW (ref 21–134)
IGG Subclass 4: 163 mg/dL — ABNORMAL HIGH (ref 1–123)

## 2018-03-31 NOTE — Telephone Encounter (Signed)
Patient presented to Rheumatology clinic for evaluation on 03/28/18.      Closing encounter.

## 2018-04-04 DIAGNOSIS — I2510 Atherosclerotic heart disease of native coronary artery without angina pectoris: Secondary | ICD-10-CM | POA: Diagnosis not present

## 2018-04-04 DIAGNOSIS — R0789 Other chest pain: Secondary | ICD-10-CM | POA: Diagnosis not present

## 2018-04-07 DIAGNOSIS — F17219 Nicotine dependence, cigarettes, with unspecified nicotine-induced disorders: Secondary | ICD-10-CM | POA: Diagnosis not present

## 2018-04-07 DIAGNOSIS — E782 Mixed hyperlipidemia: Secondary | ICD-10-CM | POA: Diagnosis not present

## 2018-04-07 DIAGNOSIS — I10 Essential (primary) hypertension: Secondary | ICD-10-CM | POA: Diagnosis not present

## 2018-04-07 DIAGNOSIS — I6521 Occlusion and stenosis of right carotid artery: Secondary | ICD-10-CM | POA: Diagnosis not present

## 2018-04-07 DIAGNOSIS — I2510 Atherosclerotic heart disease of native coronary artery without angina pectoris: Secondary | ICD-10-CM | POA: Diagnosis not present

## 2018-04-07 DIAGNOSIS — Z681 Body mass index (BMI) 19 or less, adult: Secondary | ICD-10-CM | POA: Diagnosis not present

## 2018-04-20 ENCOUNTER — Other Ambulatory Visit (INDEPENDENT_AMBULATORY_CARE_PROVIDER_SITE_OTHER): Payer: Self-pay | Admitting: Psychiatric/Mental Health

## 2018-04-20 DIAGNOSIS — R252 Cramp and spasm: Secondary | ICD-10-CM

## 2018-04-20 MED ORDER — TIZANIDINE HCL 4 MG OR TABS
6.00 mg | ORAL_TABLET | Freq: Every evening | ORAL | 0 refills | Status: DC
Start: 2018-04-20 — End: 2018-05-02

## 2018-04-20 NOTE — Telephone Encounter (Signed)
Patient is requesting refill of medication, please see orders for preloaded prescription refill of Xanaflex Take 1.5 tablets (6 mg) by mouth at bedtime.    Last visit with this physician:   2018  Next appointment with this physician:   05/02/2018    [facility] [place] [place]     Telephone Fax   [phone]   Pharmacy Address and Hours     Address Hours   [address]  [place] [address] None Available     Routing to NP [facility] for further review

## 2018-04-27 ENCOUNTER — Ambulatory Visit (HOSPITAL_BASED_OUTPATIENT_CLINIC_OR_DEPARTMENT_OTHER): Payer: Medicare Other | Admitting: Neurology

## 2018-05-01 DIAGNOSIS — Z8781 Personal history of (healed) traumatic fracture: Secondary | ICD-10-CM | POA: Diagnosis not present

## 2018-05-01 DIAGNOSIS — F1120 Opioid dependence, uncomplicated: Secondary | ICD-10-CM | POA: Diagnosis not present

## 2018-05-01 DIAGNOSIS — I2510 Atherosclerotic heart disease of native coronary artery without angina pectoris: Secondary | ICD-10-CM | POA: Diagnosis not present

## 2018-05-01 DIAGNOSIS — Z720 Tobacco use: Secondary | ICD-10-CM | POA: Diagnosis not present

## 2018-05-01 DIAGNOSIS — G5600 Carpal tunnel syndrome, unspecified upper limb: Secondary | ICD-10-CM | POA: Diagnosis not present

## 2018-05-01 DIAGNOSIS — G894 Chronic pain syndrome: Secondary | ICD-10-CM | POA: Diagnosis not present

## 2018-05-01 DIAGNOSIS — M546 Pain in thoracic spine: Secondary | ICD-10-CM | POA: Diagnosis not present

## 2018-05-01 DIAGNOSIS — Z79891 Long term (current) use of opiate analgesic: Secondary | ICD-10-CM | POA: Diagnosis not present

## 2018-05-01 DIAGNOSIS — M5090 Cervical disc disorder, unspecified, unspecified cervical region: Secondary | ICD-10-CM | POA: Diagnosis not present

## 2018-05-02 ENCOUNTER — Ambulatory Visit (INDEPENDENT_AMBULATORY_CARE_PROVIDER_SITE_OTHER): Payer: Medicare Other | Admitting: Psychiatric/Mental Health

## 2018-05-02 ENCOUNTER — Ambulatory Visit
Admission: RE | Admit: 2018-05-02 | Discharge: 2018-05-02 | Disposition: A | Discharge status: Home / Self Care | Payer: Medicare Other | Attending: Diagnostic Radiology | Admitting: Diagnostic Radiology

## 2018-05-02 ENCOUNTER — Encounter (INDEPENDENT_AMBULATORY_CARE_PROVIDER_SITE_OTHER): Payer: Self-pay | Admitting: Psychiatric/Mental Health

## 2018-05-02 DIAGNOSIS — M546 Pain in thoracic spine: Secondary | ICD-10-CM | POA: Diagnosis not present

## 2018-05-02 DIAGNOSIS — M797 Fibromyalgia: Secondary | ICD-10-CM

## 2018-05-02 DIAGNOSIS — R1013 Epigastric pain: Principal | ICD-10-CM | POA: Insufficient documentation

## 2018-05-02 DIAGNOSIS — R252 Cramp and spasm: Secondary | ICD-10-CM

## 2018-05-02 DIAGNOSIS — F322 Major depressive disorder, single episode, severe without psychotic features: Secondary | ICD-10-CM

## 2018-05-02 DIAGNOSIS — D8989 Other specified disorders involving the immune mechanism, not elsewhere classified: Secondary | ICD-10-CM

## 2018-05-02 DIAGNOSIS — F419 Anxiety disorder, unspecified: Principal | ICD-10-CM

## 2018-05-02 MED ORDER — NORTRIPTYLINE HCL 25 MG OR CAPS
50.0000 mg | ORAL_CAPSULE | Freq: Every evening | ORAL | 5 refills | Status: DC
Start: 2018-05-02 — End: 2018-08-31

## 2018-05-02 MED ORDER — BUPROPION HCL 75 MG OR TABS
75.0000 mg | ORAL_TABLET | Freq: Two times a day (BID) | ORAL | 5 refills | Status: DC
Start: 2018-05-02 — End: 2018-09-04

## 2018-05-02 MED ORDER — GABAPENTIN 300 MG OR CAPS
600.0000 mg | ORAL_CAPSULE | Freq: Every evening | ORAL | 5 refills | Status: DC
Start: 2018-05-02 — End: 2018-11-24

## 2018-05-02 MED ORDER — DIAZEPAM 5 MG OR TABS
5.0000 mg | ORAL_TABLET | Freq: Every day | ORAL | 1 refills | Status: DC | PRN
Start: 2018-05-02 — End: 2018-12-25

## 2018-05-02 MED ORDER — TIZANIDINE HCL 4 MG OR TABS
6.0000 mg | ORAL_TABLET | Freq: Every evening | ORAL | 5 refills | Status: DC
Start: 2018-05-02 — End: 2018-11-10
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
## 2018-05-07 DIAGNOSIS — M75100 Unspecified rotator cuff tear or rupture of unspecified shoulder, not specified as traumatic: Secondary | ICD-10-CM | POA: Insufficient documentation

## 2018-05-12 ENCOUNTER — Encounter (INDEPENDENT_AMBULATORY_CARE_PROVIDER_SITE_OTHER): Payer: Self-pay | Admitting: Family Practice

## 2018-05-12 DIAGNOSIS — E039 Hypothyroidism, unspecified: Principal | ICD-10-CM

## 2018-05-12 NOTE — Telephone Encounter (Signed)
From: Debra Bolton  To: Verl Blalock Edi, MD  Sent: 05/12/2018 11:03 AM PST  Subject: SM:OLMBEML    Hi Dr. Rana Snare,    I am taking 60mg  every night and I was taking an additional 15 mg every other day. I was working on the extra amt. hoping it would help my libido. I have changed it to 60 mg. every night, and an additional 15mg  every 3rd day. Do you think that is worth a try? or would you rather I only take 60 at night.  Thanks Debra Bolton  ----- Message -----  From: Christin Bach, MD  Sent: 03/29/2018 9:15 AM PST  To: Debra Bolton  Subject: results  Dear Debra Bolton,  Your TSH is too low, we need to decrease the dose of your armour thyroid, exactly how much are you taking right now?  I hope you are doing well.  Sincerely,  Rina Edi, MD

## 2018-05-12 NOTE — Telephone Encounter (Signed)
Routed to MD. Please advise.

## 2018-05-23 NOTE — Telephone Encounter (Signed)
Routed to MD. Please advise.

## 2018-05-24 NOTE — Telephone Encounter (Signed)
I don't think I have an appointment on Friday, can we try to get her in with me at some point?  Thanks!

## 2018-05-24 NOTE — Addendum Note (Signed)
Addended by: Randa Ngo on: 05/24/2018 02:06 PM     Modules accepted: Orders

## 2018-05-26 ENCOUNTER — Encounter (INDEPENDENT_AMBULATORY_CARE_PROVIDER_SITE_OTHER): Payer: Self-pay | Admitting: Family Medicine

## 2018-05-26 ENCOUNTER — Ambulatory Visit: Payer: Medicare Other | Attending: Neurology | Admitting: Neurology

## 2018-05-26 ENCOUNTER — Ambulatory Visit (INDEPENDENT_AMBULATORY_CARE_PROVIDER_SITE_OTHER): Payer: Medicare Other | Admitting: Family Medicine

## 2018-05-26 ENCOUNTER — Encounter (HOSPITAL_BASED_OUTPATIENT_CLINIC_OR_DEPARTMENT_OTHER): Payer: Self-pay | Admitting: Neurology

## 2018-05-26 ENCOUNTER — Ambulatory Visit (HOSPITAL_BASED_OUTPATIENT_CLINIC_OR_DEPARTMENT_OTHER)
Admission: RE | Admit: 2018-05-26 | Discharge: 2018-05-26 | Disposition: A | Discharge status: Home Health | Payer: Medicare Other | Attending: Family Medicine | Admitting: Family Medicine

## 2018-05-26 VITALS — BP 127/90 | HR 85 | Temp 98.8°F | Resp 16 | Ht 64.0 in | Wt 134.3 lb

## 2018-05-26 VITALS — BP 121/78 | HR 90 | Temp 98.0°F | Resp 16 | Ht 64.0 in | Wt 134.9 lb

## 2018-05-26 DIAGNOSIS — W01198A Fall on same level from slipping, tripping and stumbling with subsequent striking against other object, initial encounter: Secondary | ICD-10-CM

## 2018-05-26 DIAGNOSIS — G35 Multiple sclerosis: Principal | ICD-10-CM | POA: Insufficient documentation

## 2018-05-26 DIAGNOSIS — R030 Elevated blood-pressure reading, without diagnosis of hypertension: Secondary | ICD-10-CM

## 2018-05-26 DIAGNOSIS — M25511 Pain in right shoulder: Secondary | ICD-10-CM

## 2018-05-26 NOTE — Patient Instructions (Addendum)
1. Continue Rituximab treatments every 6 months  2. Trial on amantadine alone  3. Follow-up 6 months    Come join [place] for the next regional [place] MS Exposition, Saturday afternoon July 08, 2018 from 9am to 2 pm at the [employer] in [place]. Feel free to invite others you know with MS and bring family or friends. The event is free.     Dr Rosendo Gros and Dr Berenice Primas will be presenting case studies in the integrative management of relapsing and progressive multiple sclerosis    The exhibition hall will features vendors and resources beneficial to people with MS     All must register at [email or web address] or by calling [phone]    The event will take place at   [place], [place], [address]  [phone]    Directions and a map are available at the [place] web site when you register for the event

## 2018-05-26 NOTE — Progress Notes (Signed)
Family Medicine Clinic Progress Note    CC: Other (right shoulder injury from slip in the bathroom x2 weeks, pt with dx with MS 1987)      Subjective:     Future Appointments   Date Time Provider [place]   05/26/2018  2:40 PM Baron Hamper, MD [facility] [place]   06/12/2018 11:00 AM Mariane Masters [facility] [address] [facility]   06/13/2018  1:40 PM Johny Shears, MD [facility]   10/25/2018 11:00 AM Areta Haber, NP [place] [facility]   11/24/2018  1:30 PM Arelia Sneddon, MD [facility]       Debra Bolton is a 58 year old R handed female with a PMH of multiple sclerosis (diagnosed 1991, multiple relapses, neurologic pain controlled with Cymbalta and Gabapentin) here for evaluation of R shoulder pain. Per chart review, patient has had chronic R shoulder pain documented since 2018, diagnosed as recurrent subacromial bursitis.    1. Right Shoulder Pain  Patient reports that 2 weeks ago, she was in the bathroom, when she lost her balance backwards and hit her right upper extremity against the toilet. She had immediate pain and had difficulty lifting her arm. She denies any dislocation. The pain is graded 6/10 and has not improved significantly since injury. The pain is in the pec region, but also radiates to the entire shoulder. She feels the pain most severely when she lifts her arm, patient cannot lift her arm above her head. +nighttime pain. Taking ibuprofen prn for relief. No bruising or swelling. Some numbness/tingling that radiates down arm, but she occasionally has that sensation due to her underlying MS. Some neck soreness, but shoulder is the predominant symptom.     R subacromial bursitis corticosteroid injection 01/19/2017 by Dr. Winnifred Friar Edi.     Current Outpatient Medications   Medication Sig Dispense Refill   . albuterol (VENTOLIN HFA) 108 (90 Base) MCG/ACT inhaler Inhale 2 puffs by mouth every 6 hours as needed for Wheezing. 3 Inhaler 3   . Amantadine HCl 100 MG tablet Take 100 mg by  mouth 2 times daily. 60 tablet 0   . baclofen (LIORESAL) 10 MG tablet TAKE 1 TABLET BY MOUTH THREE TIMES A DAY 90 tablet 11   . buPROPion (WELLBUTRIN) 75 MG tablet Take 1 tablet (75 mg) by mouth 2 times daily. 60 tablet 5   . diazepam (VALIUM) 5 MG tablet Take 1 tablet (5 mg) by mouth daily as needed for Anxiety. 30 tablet 1   . DULoxetine (CYMBALTA) 60 MG CR capsule Take 2 capsules (120 mg) by mouth daily. 180 capsule 3   . gabapentin (NEURONTIN) 300 MG capsule Take 2 capsules (600 mg) by mouth nightly. 60 capsule 5   . modafinil (PROVIGIL) 200 MG tablet Take 1 tablet (200 mg) by mouth every morning. 30 tablet 0   . nortriptyline (PAMELOR) 25 MG capsule Take 2 capsules (50 mg) by mouth nightly. 60 capsule 5   . prednisoLONE (ORAPRED) 15 MG/5ML solution Take 15 mg by mouth daily.     . prednisoLONE acetate (PRED FORTE) 1 % ophthalmic suspension Place 1 drop into both eyes 4 times daily. 1 bottle 3   . thyroid (ARMOUR THYROID) 15 MG tablet Take 1 tablet (15 mg) by mouth every other day. 45 tablet 0   . thyroid (ARMOUR THYROID) 60 MG tablet Take 1 tablet (60 mg) by mouth daily. 90 tablet 0   . tiZANidine (ZANAFLEX) 4 MG  tablet Take 1.5 tablets (6 mg) by mouth at bedtime. 45 tablet 5     No current facility-administered medications for this visit.     Patient Active Problem List   Diagnosis   . Multiple sclerosis (CMS-HCC)   . IgG4 related disease (CMS-HCC)   . Uveitis of right eye   . 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 Mood disorder (CMS-HCC)   . PTSD (post-traumatic stress disorder)        Allergies   Allergen Reactions   . Penicillins Rash   . Modafinil Diarrhea and Nausea and Vomiting     Family Hx and Social Hx reviewed. See EPIC    ROS:   All other ROS negative as appropriate for chief complaint and clinical scenario     Objective:     Vitals:    05/26/18 1438   BP: 127/90   BP Location: Right arm   BP Patient Position: Sitting   BP cuff size: Regular   Pulse: 85   Resp: 16   Temp: 98.8 F  (37.1 C)   TempSrc: Oral   SpO2: 98%   Weight: 60.9 kg (134 lb 4.2 oz)   Height: 5\' 4"  (1.626 m)     Estimated body mass index is 23.05 kg/m as calculated from the following:    Height as of this encounter: 5\' 4"  (1.626 m).    Weight as of this encounter: 60.9 kg (134 lb 4.2 oz).  BP 127/90 (BP Location: Right arm, BP Patient Position: Sitting, BP cuff size: Regular)   Pulse 85   Temp 98.8 F (37.1 C) (Oral)   Resp 16   Ht 5\' 4"  (1.626 m)   Wt 60.9 kg (134 lb 4.2 oz)   SpO2 98%   BMI 23.05 kg/m     Blood Pressure   05/26/18 127/90   05/26/18 121/78   03/28/18 100/63        Date BMI Recorded 05/26/2018 05/26/2018 03/28/2018 01/16/2018 01/02/2018   BMI 23.05 kg/m2 23.16 kg/m2 23.17 kg/m2 23.91 kg/m2 23.93 kg/m2        Gen:  Alert, oriented, no acute distress  Mental status: normal mood and affect  Head:  NC, atraumatic  Chest:  CTAB, no chest deformity  CV:  RRR, no pathological murmurs to auscultation  MSK  Right SHOULDER:    INSPECTION:  Anterior - axillary fold, sternoclavicular joint and AC joint within normal limits   Lateral - Deltoid nl  Posterior - C-spine with normal lordosis, spine of scapula and inferior angle of scapula    PALPATION:   Superior  No tenderness AC joint  No tenderness clavicle  Lateral  No tenderness acromion, greater tubercle  Anterior  No tenderness bicipital groove/Anterior shoulder  No tenderness proximal humerus  Posterior - C-spine non-ttp. Spine of scapula and inferior scapular angle non-ttp    ROM:   neck with FROM, no midline TTP. Some paraspinal ttp.  Abduction limited to 100 degrees, uses compensatory muscles to abduct further  Full ROM external rotation  IR: L4     STRENGTH TESTING:  4/5 supraspinatous/abduction  4/5 external rotation  5/5 internal rotation  5/5 triceps  5/5 biceps        NEUROVASCULAR:   Sensation intact in the C4-C8 distribution     No vascular compromise noted/cap refill intact    SPECIAL TESTS:    AC Joint Evaluation:  - Negative Cross-Arm Test    Biceps  tendon Evaluation:  - Negative Speeds  Glenohumeral Joint Evaluation:  - Negative Apprehension and Relocation Testt    Impingement Evaluation:  -Negative Neer's  -Positive Hawkins    Rotator Cuff Evaluation:  -Positive Jobe test      Imaging:     MRI Cervical Spine 07/16/2016:  IMPRESSION:  1. Two small non-enhancing T2 hyperintense cord lesions at the level of C2-C3 and at the level of the C3 vertebral body, which are compatible with demyelinating plaques in this patient with provided history of multiple sclerosis. No enhancement to suggest active demyelination.  2. Multilevel cervical spine degenerative changes with moderate narrowing of the spinal canal from C3-C4 to C6-C7 secondary to disc osteophyte complexes.  3. Multilevel moderate to severe neural foraminal stenosis from C2-C3 to C6-C7 with probable compression of the corresponding exiting nerve roots.     XRAY R Shoulder 10/28/16  FINDINGS:  No fracture or malalignment is seen. No significant degenerative changes of the acromioclavicular and glenohumeral joints.  4 mm sclerotic density projects over the proximal humeral diaphysis, nonspecific but may reflect a small bone island or small deposit calcium hydroxyapatite.  Subcentimeter right perihilar nodule, nonspecific but probably reflects a vessel imaged end on.  No evidence of acute fracture      Assessment:     Jalynne Persico is a 58 year old female presenting for right shoulder pain after fall 2 weeks ago. Pain with persistent diffuse shoulder pain without improvement. Patient with limited shoulder abduction and weakness on supraspinatus and ER testing. These findings are concerning for an acute rotator cuff tear. Differential also includes cervical radiculopathy given some radicular symptoms and previous MRI findings. However, patient has experienced these symptoms previously and radicular symptoms could also be due to aggravation of MS caused by fall. Less likely fracture. No evidence of AC sprain or  shoulder dislocation. At this time, patient will need further imaging including an MRI to evaluate for an acute RC tear. Will also start Physical Therapy to improve range of motion.    PLAN:  -Right Shoulder X-ray  -Right Shoulder MRI  -Physical Therapy referral    Patient seen and discussed with Dr. Orbie Hurst, Medical Student      Followup:     Return in about 4 weeks (around 06/23/2018) for R Shoulder.      Patient Instructions:     Patient Instructions   Thank you for coming to your appointment today! Below you can find your post-visit instructions:    1. X-ray today  2. I have ordered an MRI of your right shoulder  3. Physical Therapy referal        If you have any questions or concerns, please do not hesitate to contact our office.    Broghan Pannone [name], MD        DASH Eating Plan  DASH stands for "Dietary Approaches to Stop Hypertension." The DASH eating plan is a healthy eating plan that has been shown to reduce high blood pressure (hypertension). It may also reduce your risk for type 2 diabetes, heart disease, and stroke. The DASH eating plan may also help with weight loss.  What are tips for following this plan?  General guidelines   Avoid eating more than 2,300 mg (milligrams) of salt (sodium) a day. If you have hypertension, you may need to reduce your sodium intake to 1,500 mg a day.   Limit alcohol intake to no more than 1 drink a day for nonpregnant women and 2 drinks a day for men.  One drink equals 12 oz of beer, 5 oz of wine, or 1 oz of hard liquor.   Work with your health care provider to maintain a healthy body weight or to lose weight. Ask what an ideal weight is for you.   Get at least 30 minutes of exercise that causes your heart to beat faster (aerobic exercise) most days of the week. Activities may include walking, swimming, or biking.   Work with your health care provider or diet and nutrition specialist (dietitian) to adjust your eating plan to your individual calorie  needs.  Reading food labels   Check food labels for the amount of sodium per serving. Choose foods with less than 5 percent of the Daily Value of sodium. Generally, foods with less than 300 mg of sodium per serving fit into this eating plan.      To find whole grains, look for the word "whole" as the first word in the ingredient list.  Shopping   Buy products labeled as "low-sodium" or "no salt added."   Buy fresh foods. Avoid canned foods and premade or frozen meals.  Cooking   Avoid adding salt when cooking. Use salt-free seasonings or herbs instead of table salt or sea salt. Check with your health care provider or pharmacist before using salt substitutes.   Do not fry foods. Cook foods using healthy methods such as baking, boiling, grilling, and broiling instead.   Cook with heart-healthy oils, such as olive, canola, soybean, or sunflower oil.  Meal planning       Eat a balanced diet that includes:  ? 5 or more servings of fruits and vegetables each day. At each meal, try to fill half of your plate with fruits and vegetables.  ? Up to 6-8 servings of whole grains each day.  ? Less than 6 oz of lean meat, poultry, or fish each day. A 3-oz serving of meat is about the same size as a deck of cards. One egg equals 1 oz.  ? 2 servings of low-fat dairy each day.  ? A serving of nuts, seeds, or beans 5 times each week.  ? Heart-healthy fats. Healthy fats called Omega-3 fatty acids are found in foods such as flaxseeds and coldwater fish, like sardines, salmon, and mackerel.   Limit how much you eat of the following:  ? Canned or prepackaged foods.  ? Food that is high in trans fat, such as fried foods.  ? Food that is high in saturated fat, such as fatty meat.  ? Sweets, desserts, sugary drinks, and other foods with added sugar.  ? Full-fat dairy products.   Do not salt foods before eating.   Try to eat at least 2 vegetarian meals each week.   Eat more home-cooked food and less restaurant, buffet, and fast  food.   When eating at a restaurant, ask that your food be prepared with less salt or no salt, if possible.  What foods are recommended?  The items listed may not be a complete list. Talk with your dietitian about what dietary choices are best for you.  Grains  Whole-grain or whole-wheat bread. Whole-grain or whole-wheat pasta. Brown rice. [place]ena Morrow. Bulgur. Whole-grain and low-sodium cereals. Pita bread. Low-fat, low-sodium crackers. Whole-wheat flour tortillas.  Vegetables  Fresh or frozen vegetables (raw, steamed, roasted, or grilled). Low-sodium or reduced-sodium tomato and vegetable juice. Low-sodium or reduced-sodium tomato sauce and tomato paste. Low-sodium or reduced-sodium canned vegetables.  Fruits  All fresh, dried, or frozen fruit.  Canned fruit in natural juice (without added sugar).  Meat and other protein foods  Skinless chicken or [place]. Ground chicken or [place]. Pork with fat trimmed off. Fish and seafood. Egg whites. Dried beans, peas, or lentils. Unsalted nuts, nut butters, and seeds. Unsalted canned beans. Lean cuts of beef with fat trimmed off. Low-sodium, lean deli meat.  Dairy  Low-fat (1%) or fat-free (skim) milk. Fat-free, low-fat, or reduced-fat cheeses. Nonfat, low-sodium ricotta or cottage cheese. Low-fat or nonfat yogurt. Low-fat, low-sodium cheese.  Fats and oils  Soft margarine without trans fats. Vegetable oil. Low-fat, reduced-fat, or light mayonnaise and salad dressings (reduced-sodium). Canola, safflower, olive, soybean, and sunflower oils. Avocado.  Seasoning and other foods  Herbs. Spices. Seasoning mixes without salt. Unsalted popcorn and pretzels. Fat-free sweets.  What foods are not recommended?  The items listed may not be a complete list. Talk with your dietitian about what dietary choices are best for you.  Grains  Baked goods made with fat, such as croissants, muffins, or some breads. Dry pasta or rice meal packs.  Vegetables  Creamed or fried vegetables. Vegetables  in a cheese sauce. Regular canned vegetables (not low-sodium or reduced-sodium). Regular canned tomato sauce and paste (not low-sodium or reduced-sodium). Regular tomato and vegetable juice (not low-sodium or reduced-sodium). Angie Fava. Olives.  Fruits  Canned fruit in a light or heavy syrup. Fried fruit. Fruit in cream or butter sauce.  Meat and other protein foods  Fatty cuts of meat. Ribs. Fried meat. Berniece Salines. Sausage. Bologna and other processed lunch meats. Salami. Fatback. Hotdogs. Bratwurst. Salted nuts and seeds. Canned beans with added salt. Canned or smoked fish. Whole eggs or egg yolks. Chicken or [place] with skin.  Dairy  Whole or 2% milk, cream, and half-and-half. Whole or full-fat cream cheese. Whole-fat or sweetened yogurt. Full-fat cheese. Nondairy creamers. Whipped toppings. Processed cheese and cheese spreads.  Fats and oils  Butter. Stick margarine. Lard. Shortening. Ghee. Bacon fat. Tropical oils, such as coconut, palm kernel, or palm oil.  Seasoning and other foods  Salted popcorn and pretzels. Onion salt, garlic salt, seasoned salt, table salt, and sea salt. Worcestershire sauce. Tartar sauce. Barbecue sauce. Teriyaki sauce. Soy sauce, including reduced-sodium. Steak sauce. Canned and packaged gravies. Fish sauce. Oyster sauce. Cocktail sauce. Horseradish that you find on the shelf. Ketchup. Mustard. Meat flavorings and tenderizers. Bouillon cubes. Hot sauce and Tabasco sauce. Premade or packaged marinades. Premade or packaged taco seasonings. Relishes. Regular salad dressings.  Where to find more information:   National Heart, Lung, and [place]: https://wilson-eaton.com/   American Heart Association: www.heart.org  Summary   The DASH eating plan is a healthy eating plan that has been shown to reduce high blood pressure (hypertension). It may also reduce your risk for type 2 diabetes, heart disease, and stroke.   With the DASH eating plan, you should limit salt (sodium) intake to 2,300 mg a  day. If you have hypertension, you may need to reduce your sodium intake to 1,500 mg a day.   When on the DASH eating plan, aim to eat more fresh fruits and vegetables, whole grains, lean proteins, low-fat dairy, and heart-healthy fats.   Work with your health care provider or diet and nutrition specialist (dietitian) to adjust your eating plan to your individual calorie needs.  This information is not intended to replace advice given to you by your health care provider. Make sure you discuss any questions you have with your health care provider.  Barriers to Learning assessed: none. Patient verbalizes understanding of teaching and instructions.

## 2018-05-26 NOTE — Patient Instructions (Addendum)
Thank you for coming to your appointment today! Below you can find your post-visit instructions:    1. X-ray today  2. I have ordered an MRI of your right shoulder  3. Physical Therapy referal        If you have any questions or concerns, please do not hesitate to contact our office.    Debra [name], MD        DASH Eating Plan  DASH stands for "Dietary Approaches to Stop Hypertension." The DASH eating plan is a healthy eating plan that has been shown to reduce high blood pressure (hypertension). It may also reduce your risk for type 2 diabetes, heart disease, and stroke. The DASH eating plan may also help with weight loss.  What are tips for following this plan?  General guidelines   Avoid eating more than 2,300 mg (milligrams) of salt (sodium) a day. If you have hypertension, you may need to reduce your sodium intake to 1,500 mg a day.   Limit alcohol intake to no more than 1 drink a day for nonpregnant women and 2 drinks a day for men. One drink equals 12 oz of beer, 5 oz of wine, or 1 oz of hard liquor.   Work with your health care provider to maintain a healthy body weight or to lose weight. Ask what an ideal weight is for you.   Get at least 30 minutes of exercise that causes your heart to beat faster (aerobic exercise) most days of the week. Activities may include walking, swimming, or biking.   Work with your health care provider or diet and nutrition specialist (dietitian) to adjust your eating plan to your individual calorie needs.  Reading food labels   Check food labels for the amount of sodium per serving. Choose foods with less than 5 percent of the Daily Value of sodium. Generally, foods with less than 300 mg of sodium per serving fit into this eating plan.      To find whole grains, look for the word "whole" as the first word in the ingredient list.  Shopping   Buy products labeled as "low-sodium" or "no salt added."   Buy fresh foods. Avoid canned foods and premade or frozen  meals.  Cooking   Avoid adding salt when cooking. Use salt-free seasonings or herbs instead of table salt or sea salt. Check with your health care provider or pharmacist before using salt substitutes.   Do not fry foods. Cook foods using healthy methods such as baking, boiling, grilling, and broiling instead.   Cook with heart-healthy oils, such as olive, canola, soybean, or sunflower oil.  Meal planning       Eat a balanced diet that includes:  ? 5 or more servings of fruits and vegetables each day. At each meal, try to fill half of your plate with fruits and vegetables.  ? Up to 6-8 servings of whole grains each day.  ? Less than 6 oz of lean meat, poultry, or fish each day. A 3-oz serving of meat is about the same size as a deck of cards. One egg equals 1 oz.  ? 2 servings of low-fat dairy each day.  ? A serving of nuts, seeds, or beans 5 times each week.  ? Heart-healthy fats. Healthy fats called Omega-3 fatty acids are found in foods such as flaxseeds and coldwater fish, like sardines, salmon, and mackerel.   Limit how much you eat of the following:  ? Canned or prepackaged foods.  ?  Food that is high in trans fat, such as fried foods.  ? Food that is high in saturated fat, such as fatty meat.  ? Sweets, desserts, sugary drinks, and other foods with added sugar.  ? Full-fat dairy products.   Do not salt foods before eating.   Try to eat at least 2 vegetarian meals each week.   Eat more home-cooked food and less restaurant, buffet, and fast food.   When eating at a restaurant, ask that your food be prepared with less salt or no salt, if possible.  What foods are recommended?  The items listed may not be a complete list. Talk with your dietitian about what dietary choices are best for you.  Grains  Whole-grain or whole-wheat bread. Whole-grain or whole-wheat pasta. Brown rice. [place] Morrow. Bulgur. Whole-grain and low-sodium cereals. Pita bread. Low-fat, low-sodium crackers. Whole-wheat flour  tortillas.  Vegetables  Fresh or frozen vegetables (raw, steamed, roasted, or grilled). Low-sodium or reduced-sodium tomato and vegetable juice. Low-sodium or reduced-sodium tomato sauce and tomato paste. Low-sodium or reduced-sodium canned vegetables.  Fruits  All fresh, dried, or frozen fruit. Canned fruit in natural juice (without added sugar).  Meat and other protein foods  Skinless chicken or [place]. Ground chicken or [place]. Pork with fat trimmed off. Fish and seafood. Egg whites. Dried beans, peas, or lentils. Unsalted nuts, nut butters, and seeds. Unsalted canned beans. Lean cuts of beef with fat trimmed off. Low-sodium, lean deli meat.  Dairy  Low-fat (1%) or fat-free (skim) milk. Fat-free, low-fat, or reduced-fat cheeses. Nonfat, low-sodium ricotta or cottage cheese. Low-fat or nonfat yogurt. Low-fat, low-sodium cheese.  Fats and oils  Soft margarine without trans fats. Vegetable oil. Low-fat, reduced-fat, or light mayonnaise and salad dressings (reduced-sodium). Canola, safflower, olive, soybean, and sunflower oils. Avocado.  Seasoning and other foods  Herbs. Spices. Seasoning mixes without salt. Unsalted popcorn and pretzels. Fat-free sweets.  What foods are not recommended?  The items listed may not be a complete list. Talk with your dietitian about what dietary choices are best for you.  Grains  Baked goods made with fat, such as croissants, muffins, or some breads. Dry pasta or rice meal packs.  Vegetables  Creamed or fried vegetables. Vegetables in a cheese sauce. Regular canned vegetables (not low-sodium or reduced-sodium). Regular canned tomato sauce and paste (not low-sodium or reduced-sodium). Regular tomato and vegetable juice (not low-sodium or reduced-sodium). Angie Fava. Olives.  Fruits  Canned fruit in a light or heavy syrup. Fried fruit. Fruit in cream or butter sauce.  Meat and other protein foods  Fatty cuts of meat. Ribs. Fried meat. Berniece Salines. Sausage. Bologna and other processed lunch meats.  Salami. Fatback. Hotdogs. Bratwurst. Salted nuts and seeds. Canned beans with added salt. Canned or smoked fish. Whole eggs or egg yolks. Chicken or [place] with skin.  Dairy  Whole or 2% milk, cream, and half-and-half. Whole or full-fat cream cheese. Whole-fat or sweetened yogurt. Full-fat cheese. Nondairy creamers. Whipped toppings. Processed cheese and cheese spreads.  Fats and oils  Butter. Stick margarine. Lard. Shortening. Ghee. Bacon fat. Tropical oils, such as coconut, palm kernel, or palm oil.  Seasoning and other foods  Salted popcorn and pretzels. Onion salt, garlic salt, seasoned salt, table salt, and sea salt. Worcestershire sauce. Tartar sauce. Barbecue sauce. Teriyaki sauce. Soy sauce, including reduced-sodium. Steak sauce. Canned and packaged gravies. Fish sauce. Oyster sauce. Cocktail sauce. Horseradish that you find on the shelf. Ketchup. Mustard. Meat flavorings and tenderizers. Bouillon cubes. Hot sauce and [place]  sauce. Premade or packaged marinades. Premade or packaged taco seasonings. Relishes. Regular salad dressings.  Where to find more information:   National Heart, Lung, and [place]: https://wilson-eaton.com/   American Heart Association: www.heart.org  Summary   The DASH eating plan is a healthy eating plan that has been shown to reduce high blood pressure (hypertension). It may also reduce your risk for type 2 diabetes, heart disease, and stroke.   With the DASH eating plan, you should limit salt (sodium) intake to 2,300 mg a day. If you have hypertension, you may need to reduce your sodium intake to 1,500 mg a day.   When on the DASH eating plan, aim to eat more fresh fruits and vegetables, whole grains, lean proteins, low-fat dairy, and heart-healthy fats.   Work with your health care provider or diet and nutrition specialist (dietitian) to adjust your eating plan to your individual calorie needs.  This information is not intended to replace advice given to you by your health  care provider. Make sure you discuss any questions you have with your health care provider.

## 2018-05-26 NOTE — Progress Notes (Signed)
[facility]    First visit: 12/09/2015     Last visit: 10/19/17 Current visit: 05/26/2018    Consultation Source: seen at [facility], Lavon Paganini and Christy Gentles, [email or web address] [phone]    Principle Neurological Diagnosis:   1. Clinically definite Multiple Sclerosis, relapsing form  2. Anterior uveitis  3. IgG4 related lymphadenopathy    Narrative History for Current Visit: Debra Bolton is a 58 year old woman living in [place] for the past 2 years, but originally from [place], seen for 27 years by Dr. Lavon Paganini at [facility] [place]. 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 We reviewed the history and problem list in detail today which is documented in the following sections.     Concerns today:  1) Rheumatology recommends Rituximab Q6 months and Ophthalmologist recommends a lower dose more frequently for the uveitis.  2) 2 falls recently feels she is tripping and catching her feet (R>L); concerned that her balance is more off lately. 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 Dr Truman Hayward is waiting several months to make sure uveitis resolved before doing cataract surgery    Continues to have mouth ulcers continue to occur intermittently (sore raised white spot without bleeding but painful).     Her problem list was reviewed and updated below:  _____________________________________________________________________    MS Problem List:   **1. Mobility (ND;range 0-6):2  recent falls; difficulty with balance  2. Hand Function (ND;range 0-5):right hand clumsiness  3. Vision (ND; range 0-5): Decreased vision OD>OS  **4. Fatigue ( ND; range 0-5): provigil 200 mg in morning prn makes her nausea and shakey so uses it sparingly; so will use amantadine sometimes instead which does not work as well    5. Cognition ( ND; range 0-5):episodes of word finding difficulties within past several years  6. Bowel/Bladder ( ND; range 0-5): rare bowel incontinence starting 10 years ago, infrequent urinary retention and hesitancy;    7. Sensory ( ND; range 0-5): bilateral leg tingling and pain (right > left), slightly increasing arm tingling (right >>left)  8. Spasticity ( ND; range 0-5): bilateral legs, improved with yoga and now graduating as an Art therapist  **9. Pain ( ND; range 0-5): lower extremity tingling and burning pain; currently on 600 mg qhs of gabapetin and cymbalta 60 mg qhs    MS Performance Scale Score (sum of problem list scores 1-9):  ND  (result ranges from 0 (normal) to 46 (maximal interference of symptoms on life))    10. Affective: (BDI II:  ND) longstanding history of depression  11. Social Support: lives with boyfriend, 3 daughters none present in [place] - in [place], one in [place] and one in [place]. Other symptoms or problems:  None  ______________________________________________________________________    MS History      Initial Symptoms (Date and Description): 1987 (~age 31), after delivery of first daughter, both legs would fall asleep during exercise  1988 grand mal seizure in doctor office  1989 another grand mal seizure in [place] while on vacation, was placed on tegretol for 2 years  1991 diagnosed with MS as she had more frequent episodes of fatigue, depression and numbness     Second Attack (Date and Description):     Subsequent relapses and course:    Relapses in Past 2 years: see above     Progressive Disease (approx onset and description): see  above      Disease Modifying Treatment History: [Documented Needle phobia: No]    1. Betaseron (~ 1 year on therapy)  2. Copaxone ( <1 year) discontinued because it worsened depression  3. Tysabri (~2014 - Sep 11, 2015)  4. Rituximab 500 mg X 2 doses (07/01/17 & 07/19/17), 1000 mg X 2 (01/02/18 & 01/16/18)    ______________________________________________________________________    Review of Systems: Complete 10 point review of symptoms from the patient self report questionnaire was reviewed with the patient today and scanned into the media tab. Any pertinent positives are listed above in the narrative history or problem list.    Allergies   Allergen Reactions   . Penicillins Rash   . Modafinil Diarrhea and Nausea and Vomiting       Current Outpatient Medications   Medication Sig   . albuterol (VENTOLIN HFA) 108 (90 Base) MCG/ACT inhaler Inhale 2 puffs by mouth every 6 hours as needed for Wheezing.   . Amantadine HCl 100 MG tablet Take 100 mg by mouth 2 times daily.   . baclofen (LIORESAL) 10 MG tablet TAKE 1 TABLET BY MOUTH THREE TIMES A DAY   . buPROPion (WELLBUTRIN) 75 MG tablet Take 1 tablet (75 mg) by mouth 2 times daily.   . diazepam (VALIUM) 5 MG tablet Take 1 tablet (5 mg) by mouth daily as needed for Anxiety.   . DULoxetine (CYMBALTA) 60 MG CR capsule Take 2 capsules (120 mg) by mouth daily.   . gabapentin (NEURONTIN) 300 MG capsule Take 2 capsules (600 mg) by mouth nightly.   . modafinil (PROVIGIL) 200 MG tablet Take 1 tablet (200 mg) by mouth every morning.   . nortriptyline (PAMELOR) 25 MG capsule Take 2 capsules (50 mg) by mouth nightly.   . prednisoLONE (ORAPRED) 15 MG/5ML solution Take 15 mg by mouth daily.   . prednisoLONE acetate (PRED FORTE) 1 % ophthalmic suspension Place 1 drop into both eyes 4 times daily.   . thyroid (ARMOUR THYROID) 15 MG tablet Take 1 tablet (15 mg) by mouth every other day.   . thyroid (ARMOUR THYROID) 60 MG tablet Take 1 tablet (60 mg) by mouth daily.   . tiZANidine (ZANAFLEX) 4  MG tablet Take 1.5 tablets (6 mg) by mouth at bedtime.     No current facility-administered medications for this visit.        Past Medical History:   Diagnosis Date   . Asthma 1991    with pregnancy   . Hypothyroidism    . Major depressive disorder, single episode    . MS (multiple sclerosis) (CMS-HCC)      Past Surgical History:   Procedure Laterality Date   . leep      for cervical dysplasia   . LYMPH NODE BIOPSY      IgG4 related disease   . TONSILLECTOMY AND ADENOIDECTOMY      only tonsillectomy       Family Medical History:  Family History   Problem Relation Name Age of Onset   . Other Mother rita mcclain    .   Thyroid Mother rita mcclain    . Cancer Father don mc clain         lung   . Cancer Brother     . Cancer Sister rita mcclain         breast   . Diabetes Sister suzanne mcclain    . Stroke Brother Don mcclain      ______________________________________________________________________    Examination:   BP 121/78 (BP Location: Left arm, BP Patient Position: Sitting, BP cuff size: Regular)   Pulse 90   Temp 98 F (36.7 C) (Oral)   Resp 16   Ht 5' 4" (1.626 m)   Wt 61.2 kg (134 lb 14.7 oz)   SpO2 99%   BMI 23.16 kg/m   General: Examination of the skin, joints and extremities revealed no abnormalities  ;  There were no cervical, ocular or cranial bruits.     Cognitive/Behavioral: Examination of cognition, language and prosody revealed no abnormalities . Behavioral and affect was appropriate.    Cranial Nerves: Near visual acuity wasJ10 OD and J2 OS with PH (previously 20/400 OD and J7 OS without correction).Visual fields were full  to confrontation testing. Lids were normal .  Pupils were 4mm  and reactive with no  RAPD . Fundoscopic exam revealed no definite optic nerve pallor. Eyes were orthotropic with bilateral rotary nystagmus and left end point nystagmus.  Pursuit and saccadic eye movements revealed few beats of lateral nystagmus in both directions. Facial movements revealed no abnormalities.  Testing of facial sensation revealed no abnormalities. Hearing was normal  AD and normal  AS. Bulbar examination revealed no abnormalities .     Motor: Upper extremity strength was as follows (R/L): Shoulder abduction 5/5, Elbow extension 5/5, Wrist extension 5/5, Hand instrinsics 5/5. Rapid alternating movements were graded 1/1+. Spasticity (ash[place]) was rated 1/1. Lower extremity strength was as follows (R/L): Hip Flexors 5/5, Knee Flexors 5/5, Ankle dorsiflexors 5/5; Toe tapping was rating 1/1; Spasticity was rated 1+/1  There was no atrophy. There were no fasiculations.    Cerebellar: Finger to nose testing revealed no abnormalities  in the RUE and no abnormalities  in the LUE. Heel to shin testing revealed no abnormalities  in the RLE and no abnormalities  in the LLE. (see gait for description of midline/axial cerebellum dysfunction)    Sensory: Sensation to pp and temperature was normal. Vibration sensation duration (secs) was (R/L): Upper extremity middle finger ND/ND; Lower extremity big toe   Joint position sensation was normal .      Reflexes: (R/L): Biceps 2+/2+, BR 2+/2+, Triceps 2+/2+, Patella 2+/2+, Ankle 2/2. There was no clonus .     Gait Description: normal gait and station.  Ambulation was performed safely without  assistance. The patient was able to  tandem walk 8 steps without faltering.   ----------------------------------------------------------------------------------------------------------------------  Performance Measures:  9-Hole PEG Test 05/26/2018 06/17/2017   RUE  19.8 21.5   RUE (Best) 17.9 19.5   LUE 19.8 22.7   LUE (Best) 18.5 19.6       25 Ft. Ambulation Time 05/26/2018 06/17/2017   25 ft Ambulation Time - 1st Trial (Seconds) 4.5 4.5   Independently or With Assistance? Independently Independently   25 ft Ambulation Time - 2nd Trial (Seconds) 4.6 4.5   Independently or With Assistance? Independently Independently       No flowsheet data found.    No flowsheet data found.    No flowsheet  data found.    No flowsheet   data found.    ----------------------------------------------------------------------------------------------------------------------    Review of Imaging Studies:  1. MRI brain 08/10/17 reviewed and compared to prior MRI dated 07/16/16: [facility] MS; New T2=3 (small discrete and all in DWM); relatively low burden. No gad lesions. Mild atrophy for age    Review of Labs:  1. CSF reportedly with oligoclonal bands  JC virus 0.24  (09/11/15), 0.20 (01/02/16)    Assessment:   1.  Clinically definite Multiple Sclerosis, relapsing form, Stable clinically. New baseline MRI without activity but New T2 lesions since last study  07/16/16; now on rituximab and tolerating well    Atypical features (red flags) include: seizure at onset  Individual risk factors for disease activity and worsening disability include: None known but no imaging studies available    2.  Neuropathic pain: better on current regimen    3.  Anxiety and depression: better    4. Fatigue: still problematic. Sleeps well and depression well controlled. No obvious medication causes and hypothyroidism well controlled. Provigil causes nausea.Amantadine helps but hasn't been using without provigil    5. 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     6. IgG 4 related lymphadenopathy; isolated to cervical lymph notes; stable symptomatically     After discussing the options available and the potential benefits (pros) and risks (cons), we mutually decided on the following management plan    Plan:  1. Continue Rituximab treatments every 6 months  2. Trial on amantadine alone  3. Follow-up 6 months      Total time spent with the patient today was 30 minutes with over 50% of the time spent on education and counseling regarding their problem list and the assessment and plan

## 2018-05-29 ENCOUNTER — Ambulatory Visit
Admission: RE | Admit: 2018-05-29 | Discharge: 2018-05-29 | Disposition: A | Discharge status: Home / Self Care | Payer: Medicare Other | Attending: Diagnostic Radiology | Admitting: Diagnostic Radiology

## 2018-05-29 ENCOUNTER — Encounter (INDEPENDENT_AMBULATORY_CARE_PROVIDER_SITE_OTHER): Payer: Self-pay | Admitting: Hospital

## 2018-05-29 DIAGNOSIS — Z8781 Personal history of (healed) traumatic fracture: Secondary | ICD-10-CM | POA: Diagnosis not present

## 2018-05-29 DIAGNOSIS — M5090 Cervical disc disorder, unspecified, unspecified cervical region: Secondary | ICD-10-CM | POA: Diagnosis not present

## 2018-05-29 DIAGNOSIS — M546 Pain in thoracic spine: Secondary | ICD-10-CM | POA: Diagnosis not present

## 2018-05-29 DIAGNOSIS — F1120 Opioid dependence, uncomplicated: Secondary | ICD-10-CM | POA: Diagnosis not present

## 2018-05-29 DIAGNOSIS — Z79891 Long term (current) use of opiate analgesic: Secondary | ICD-10-CM | POA: Diagnosis not present

## 2018-05-29 DIAGNOSIS — G894 Chronic pain syndrome: Secondary | ICD-10-CM | POA: Diagnosis not present

## 2018-05-29 DIAGNOSIS — Z720 Tobacco use: Secondary | ICD-10-CM | POA: Diagnosis not present

## 2018-05-29 DIAGNOSIS — G5600 Carpal tunnel syndrome, unspecified upper limb: Secondary | ICD-10-CM | POA: Diagnosis not present

## 2018-05-29 DIAGNOSIS — I2510 Atherosclerotic heart disease of native coronary artery without angina pectoris: Secondary | ICD-10-CM | POA: Diagnosis not present

## 2018-05-29 DIAGNOSIS — W19XXXA Unspecified fall, initial encounter: Secondary | ICD-10-CM | POA: Insufficient documentation

## 2018-05-29 DIAGNOSIS — M62511 Muscle wasting and atrophy, not elsewhere classified, right shoulder: Secondary | ICD-10-CM | POA: Insufficient documentation

## 2018-05-29 DIAGNOSIS — S46011A Strain of muscle(s) and tendon(s) of the rotator cuff of right shoulder, initial encounter: Principal | ICD-10-CM | POA: Insufficient documentation

## 2018-05-29 DIAGNOSIS — M25511 Pain in right shoulder: Secondary | ICD-10-CM

## 2018-05-29 DIAGNOSIS — M25411 Effusion, right shoulder: Secondary | ICD-10-CM | POA: Insufficient documentation

## 2018-05-29 DIAGNOSIS — M7521 Bicipital tendinitis, right shoulder: Secondary | ICD-10-CM | POA: Insufficient documentation

## 2018-05-30 ENCOUNTER — Encounter (INDEPENDENT_AMBULATORY_CARE_PROVIDER_SITE_OTHER): Payer: Self-pay | Admitting: Family Medicine

## 2018-06-04 ENCOUNTER — Encounter (INDEPENDENT_AMBULATORY_CARE_PROVIDER_SITE_OTHER): Payer: Self-pay | Admitting: Family Medicine

## 2018-06-04 ENCOUNTER — Other Ambulatory Visit (INDEPENDENT_AMBULATORY_CARE_PROVIDER_SITE_OTHER): Payer: Self-pay | Admitting: Family Practice

## 2018-06-04 DIAGNOSIS — E039 Hypothyroidism, unspecified: Principal | ICD-10-CM

## 2018-06-04 DIAGNOSIS — M75100 Unspecified rotator cuff tear or rupture of unspecified shoulder, not specified as traumatic: Secondary | ICD-10-CM

## 2018-06-04 NOTE — Progress Notes (Signed)
New PT referral placed given patient desires different location.

## 2018-06-04 NOTE — Telephone Encounter (Signed)
From: Debra Bolton  To: Baron Hamper, MD  Sent: 06/04/2018 1:05 PM PST  Subject: [id number] X-ray    Hi Dr. [name],  Just checking on whether or not I can use the [facility] in [place]?  Thanks  Debra Bolton  ----- Message -----  From: Baron Hamper, MD  Sent: 05/30/2018 8:35 PM PST  To: Debra Bolton  Subject: [id number] X-ray  Hi [name],    I contacted our referral coordinator regarding the [facility] in [place]. I will let you know.    As for your MRI, I have included the main findings below. There is evidence of rotator cuff tearing. I suspect your fall caused some acute on chronic tearing of two of your rotator cuff muscles. This is likely contributing to some of your pain. I would definitely recommend Physical Therapy at this point since many individuals respond very well to PT alone and is the first-line treatment.    We will definitely go into your MRI in more detail at the next visit. Please let me know if you have any questions or concerns.     Dr. [name]      IMPRESSION:  Moderate interstitial tearing of the supraspinatus tendon, with region of focal full-thickness tearing. At least moderate grade articular sided tearing of the subscapularis tendon. Associated mild atrophy of the supraspinatus and subscapularis muscles as described.    Tendinosis of the long head biceps tendon.    Small to moderate glenohumeral joint effusion with small amount of subacromial-subdeltoid bursal fluid. Mild thickening of the coracoacromial ligament.      ----- Message -----   From: Debra Bolton   Sent: 05/30/2018 2:51 PM PST   To: Baron Hamper, MD  Subject: [id number] X-ray    Dear Dr. [name],  I completed the MRI on Monday.  I received a referral for physical therapy at [facility] in [place]. I would prefer to go to the [facility] therapy in [facility], safer area. Is this a problem? The contact person for the referral said to contact you with any issues.  I am also still in a  pretty lot of pain and curious to see if anything was torn... and if I should start physical therapy without knowing what we are working with. I appreciate any input.  Thanks  Debra Bolton  ----- Message -----  From: Baron Hamper, MD  Sent: 05/26/2018 7:01 PM PST  To: Debra Bolton  Subject: Shoulder X-ray  Hello Ms. Bousquet,    Your shoulder x-rays showed no evidence of fracture. While X-rays do not show any muscles or tendons, there is some suggestion of a possible rotator cuff injury that I was worried about. So I think the MRI is appropriate to obtain at this time. I would also like to follow-up with you in 2 weeks. I am in clinic on 2/24 and see you have an appointment scheduled with the eye specialist, so if available, I would suggest making an appointment with myself.    Please let me know if you have any questions or concerns.    Dr. [name]    IMPRESSION:  No acute osseous abnormality of the right shoulder.    Suggestion of mild superior subluxation of the humeral head, which could be secondary to the radiographic technique versus underlying rotator cuff injury

## 2018-06-05 ENCOUNTER — Other Ambulatory Visit: Payer: Self-pay

## 2018-06-06 ENCOUNTER — Encounter (INDEPENDENT_AMBULATORY_CARE_PROVIDER_SITE_OTHER): Payer: Self-pay | Admitting: Hospital

## 2018-06-06 MED ORDER — ARMOUR THYROID 15 MG OR TABS
ORAL_TABLET | ORAL | 0 refills | Status: DC
Start: 2018-06-06 — End: 2018-10-06

## 2018-06-06 NOTE — Telephone Encounter (Signed)
I signed refill can we make sure she gets her TSH checked?  Thanks!

## 2018-06-12 ENCOUNTER — Encounter (INDEPENDENT_AMBULATORY_CARE_PROVIDER_SITE_OTHER): Payer: Self-pay | Admitting: Family Medicine

## 2018-06-12 ENCOUNTER — Ambulatory Visit (INDEPENDENT_AMBULATORY_CARE_PROVIDER_SITE_OTHER): Payer: Medicare Other | Admitting: Family Medicine

## 2018-06-12 ENCOUNTER — Ambulatory Visit (INDEPENDENT_AMBULATORY_CARE_PROVIDER_SITE_OTHER): Payer: Medicare Other | Admitting: Ophthalmology

## 2018-06-12 VITALS — BP 104/67 | HR 83 | Temp 98.1°F | Wt 132.1 lb

## 2018-06-12 DIAGNOSIS — H209 Unspecified iridocyclitis: Principal | ICD-10-CM

## 2018-06-12 DIAGNOSIS — M75101 Unspecified rotator cuff tear or rupture of right shoulder, not specified as traumatic: Secondary | ICD-10-CM

## 2018-06-12 NOTE — Patient Instructions (Addendum)
Thank you for coming to your appointment today! Below you can find your post-visit instructions:    1. Start PT  2. Schedule appointment with Orthopedics      If you have any questions or concerns, please do not hesitate to contact our office.    Debra Bolton [name], MD          Shoulder Pain  What causes pain in my shoulder?   A common cause of shoulder pain is soreness of the tendon (a cord that attaches a muscle to a bone) of the rotator cuff (the part of the shoulder that helps circular motion). Another common cause is soreness of the subacromial bursa (a sac of fluid under the highest part of the shoulder). You might have soreness after activities such as painting, lifting or playing a sport, which require you to lift your arms. Or you may not remember any specific injury.  The main joint in the shoulder is formed by the arm bone and the shoulder blade. The joint socket is shallow, allowing a wide range of motion in the arm. The rotator cuff is made up of 4 muscles that surround the arm bone. This cuff keeps the shoulder steady as the arm moves.  How does the rotator cuff get hurt?  The supraspinatus muscle rests on top of the shoulder. Its tendon travels under the bone on the outside of the shoulder (the acromion). This tendon is the one most often injured because of its position between the bones. As the tendon becomes inflamed (sore and swollen), it can become pinched between the 2 bones. The sac of fluid that cushions the tendon can also be damaged.   How do I know the rotator cuff is hurt?   If the rotator cuff is involved, the pain is usually in the front or outside of the shoulder. This pain is usually worse when you raise your arm or lift something above your head. The pain can be bad enough to keep you from doing even the simplest tasks. Pain at night is common, and it may be bad enough to wake you.   What can I do to help the pain?  Treatment should help your pain and help you restore your shoulder to  normal function. Pain relief strategies include active rest (you can and should move your shoulder, but you shouldn't do strenuous activities like lifting heavy objects or playing tennis). Application of ice, taking nonsteroidal anti-inflammatory medicine such as ibuprofen (two brand names: Advil, Motrin) or naproxen (brand name: Aleve) and, occasionally, an injection of anti-inflammatory steroids can also help.  Normal function can be restored with special exercises. The first step of rehabilitation therapy is simple range-of-motion exercises. By bending over and moving (rotating) your shoulder in large circles, you will help to avoid the serious complication of rotator cuff injury, called a frozen shoulder. These range-of-motion exercises are followed by resistance exercises using rubber tubing or light dumbbells. The final step is resistance training with weight machines or free weights.  What exercises should I do?  The following exercises may help you (see pictures 1, 2 and 3). Check with your doctor to see if you should do other exercises, too.  Picture 1. Range of motion. Stand up and lean over so you're facing the floor. Let your sore arm dangle straight down. Draw circles in the air with your sore arm. Start with small circles, and then draw bigger ones. Repeat these exercises 5 to 10 times during the day. If you  have pain, stop. You can try again later.        Picture 1   Picture 2. Rotator cuff strengthening. Use a piece of rubber tubing for these exercises. Stand next to a closed door with a doorknob. Loop the tubing around the knob. With your hand that is closest to the door, bend your arm at a 90 angle and grab the loop of the tubing. Pull the band across your tummy. At first, do one set of 10 exercises. Try to increase the number of sets as your shoulder pain lessens. These exercises should be done every day.        Picture 2   Picture 3. Upper extremity strengthening. As your pain goes away, try  adding a general upper body weight-lifting program using weight machines or free weights. Lie on your right side with your left arm at your side. With a weight in your left hand and your forearm across your tummy, raise your forearm. Keep your elbow near your side.        Picture 3   What else can I do to help this injury heal?   An aerobic exercise program will help improve the blood flow to the tendon or bursa. This helps reduce soreness. Smokers should quit smoking so more oxygen reaches the injured tendon. This will help the injury heal faster.   Will I need surgery?  Sometimes an injury that lasts a long time will cause the tendon to tear. This type of injury may need surgery. A tear of the rotator cuff is suspected when the pain goes on in spite of a good rehabilitation program or when there is weakness in certain motions of the arm.     [employer]  Before you start  The exercises described below can help you strengthen the muscles in your shoulder (especially the muscles of the rotator cuff--the part that helps circular motion). These exercises should not cause you pain. If you feel any pain, stop exercising. Start again with a lighter weight.  Look at the pictures with each exercise so you can use the correct position. Warm up before adding weights. To warm up, stretch your arms and shoulders, and do pendulum exercises. To do pendulum exercises, bend from the waist, letting your arms hang down. Keep your arm and shoulder muscles relaxed, and move your arms slowly back and forth. Perform the exercises slowly: Lift your arm to a slow count of 3 and lower your arm to a slow count of 6.  Keep repeating each of the following exercises until your arm is tired. Use a light enough weight that you don't get tired until you've done the exercise about 20 to 30 times. Increase the weight a little each week (but never so much that the weight causes  pain). Start with 2 ounces the first week. Move up to 4 ounces the second week, 8 ounces the next week and so on.  Each time you finish doing all 4 exercises, put an ice pack on your shoulder for 20 minutes. It's best to use a plastic bag with ice cubes in it or a bag of frozen peas, not gel packs. If you do all 4 exercises 3 to 5 times a week, your rotator cuff muscles will become stronger, and you'll get back normal strength in your shoulder.  Exercise 1   Start by lying on your  stomach on a table or a bed. Put your left arm out at shoulder level with your elbow bent to 90 and your hand down. Keep your elbow bent, and slowly raise your left hand. Stop when your hand is level with your shoulder. Lower your hand slowly. Repeat the exercise until your arm is tired. Then do the exercise with your right arm.     Exercise 2   Lie on your right side with a rolled-up towel under your right armpit. Stretch your right arm above your head. Keep your left arm at your side with your elbow bent to 90 and the forearm resting against your chest, palm down. Roll your left shoulder out, raising the left forearm until it's level with your shoulder. (Hint: This is like the backhand swing in tennis.) Lower the arm slowly. Repeat the exercise until your arm is tired. Then do the exercise with your right arm.        Exercise 3  Lie on your right side. Keep your left arm along the upper side of your body. Bend your right elbow to 90. Keep the right forearm resting on the table. Now roll your right shoulder in, raising your right forearm up to your chest. (Hint: This is like the forehand swing in tennis.) Lower the forearm slowly. Repeat the exercise until your arm is tired. Then do the exercise with your left arm.        Exercise 4  In a standing position, start with your right arm halfway between the front and side of your body, thumb down. (You may need to raise your left arm for balance.) Raise your right arm until almost level  (about a 45 angle). (Hint: This is like emptying a can.) Don't lift beyond the point of pain. Slowly lower your arm. Repeat the exercise until your arm is tired. Then do the exercise with your left arm.        Special Instructions:           Copyright  [employer] 2008       Copyright  2010 [employer]. All rights reserved. - www.mdconsult.com

## 2018-06-12 NOTE — Assessment & Plan Note (Addendum)
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

## 2018-06-12 NOTE — Progress Notes (Signed)
F[facility]ILY [facility] PROGRESS NOTE    CC:    Chief Complaint   Patient presents with   . Follow up Results       SUBJECTIVE:    Debra Bolton is a 58 year old female who presents for follow-up of right shoulder pain.    1. Right Shoulder Pain  Patient with fall 1 month ago that resulted in weakness and pain of her right shoulder. She reports 50% improvement in her symptoms since last visit. She still continues to experience anterolateral shoulder pain that goes to pec area. Her pain is aggravated by lifting her arm above her head or picking up objects. +nighttime pain. She continues to have tingling of her right upper extremity.     Review of Systems:   GEN: no fevers, chills  NEURO: +weakness  MSK: +shoulder pain  SKIN: no rashes      Patient Active Problem List   Diagnosis   . Multiple sclerosis (CMS-HCC)   . IgG4 related disease (CMS-HCC)   . Uveitis of right eye   . 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 Mood disorder (CMS-HCC)   . PTSD (post-traumatic stress disorder)     Past Medical History:   Diagnosis Date   . Asthma 1991    with pregnancy   . Hypothyroidism    . Major depressive disorder, single episode    . MS (multiple sclerosis) (CMS-HCC)          OBJECTIVE:  BP 104/67 (BP Location: Left arm, BP Patient Position: Sitting, BP cuff size: Regular)   Pulse 83   Temp 98.1 F (36.7 C) (Oral)   Wt 59.9 kg (132 lb 0.9 oz)   SpO2 98%   BMI 22.67 kg/m     Wt Readings from Last 10 Encounters:   06/12/18 59.9 kg (132 lb 0.9 oz)   05/26/18 60.9 kg (134 lb 4.2 oz)   05/26/18 61.2 kg (134 lb 14.7 oz)   03/28/18 61.2 kg (135 lb)   01/16/18 63.2 kg (139 lb 4.8 oz)   12/27/17 63.2 kg (139 lb 6.4 oz)   11/18/17 63.5 kg (139 lb 15.9 oz)   10/19/17 61.7 kg (136 lb)   10/14/17 62.1 kg (136 lb 14.5 oz)   07/19/17 62.8 kg (138 lb 8 oz)        GEN:  WDWN, NAD. Well appearing.   HEENT: NCAT.  EXT:  Warm and well perfused.  MSK:  Right SHOULDER:  INSPECTION:  Anterior - axillary fold, sternoclavicular  joint and AC joint within normal limits   Lateral - Deltoid nl  Posterior - C-spine with normal lordosis, spine of scapula and inferior angle of scapula    PALPATION:   Superior  No tenderness AC joint  No tenderness clavicle  Lateral  No tenderness acromion, greater tubercle  Anterior  No tenderness bicipital groove/Anterior shoulder  No tenderness proximal humerus  Posterior - C-spine non-ttp. Spine of scapula and inferior scapular angle non-ttp    ROM:   neck with FROM, no midline TTP. Some paraspinal ttp.  Abduction limited to 110 degrees  Full ROM external rotation  IR: L3                STRENGTH TESTING:  4/5 supraspinatous/abduction  4/5 external rotation  5/5 internal rotation  5/5 triceps  5/5 biceps        NEUROVASCULAR:    Sensation intact in the C4-C8 distribution     No vascular compromise noted/cap  refill intact    SPECIAL TESTS:  Negative Spurlings    Impingement Evaluation:  -Negative Neer's  -Negative Hawkins    Rotator Cuff Evaluation:  -Positive Jobe test    IMAGING:  MRI Right Shoulder (05/29/18):  IMPRESSION:  Moderate interstitial tearing of the supraspinatus tendon, with region of focal full-thickness tearing. At least moderate grade articular sided tearing of the subscapularis tendon. Associated mild atrophy of the supraspinatus and subscapularis muscles as described.    Tendinosis of the long head biceps tendon.    Small to moderate glenohumeral joint effusion with small amount of subacromial-subdeltoid bursal fluid. Mild thickening of the coracoacromial ligament.    ASSESSMENT & PLAN:    Debra Bolton is a 58 year old female presenting for follow-up of right shoulder pain. MRI with tearing of supraspinatus and subscapularis tendons with associated mild atrophy. Suspect that fall resulted in acute on chronic tearing of rotator cuff tendons. Change in functional status and effusion is consistent with likely acute injury, while it would be too soon to see atrophy, so suspect patient had  chronic tearing as well. She has improved with rest and will be starting PT soon. Given full-thickness tearing, it is reasonable to have her schedule appointment with Orthopedics in 1 month after course of PT to discuss surgical options if not improving. Patient also has some radicular symptoms that could be due to MS vs. Cervical radiculopathy. MRI with severe foraminal stenosis at certain levels that could have been aggravated by fall. Neurovascularly intact on exam today without any significant weakness.    Tear of right rotator cuff, unspecified tear extent, unspecified whether traumatic  -     Orthopedics Clinic  - Start Physical Therapy  - Return precautions      Patient verbalized understanding of teaching and instructions, in agreement with plan of care.      F/U: 6 weeks      Medication Review:  Medications reviewed with patient and medication list reconciled.  Over the counter medications, herbal therapies and supplements reviewed.  Patient's understanding and response to medications assessed.   Barriers to medications assessed and addressed.   Risks, benefits, alternatives to medications reviewed.    Baron Hamper, MD  [facility]  06/12/2018  9:09 AM

## 2018-06-13 ENCOUNTER — Ambulatory Visit (INDEPENDENT_AMBULATORY_CARE_PROVIDER_SITE_OTHER): Payer: Medicare Other | Admitting: Internal Medicine

## 2018-06-13 ENCOUNTER — Encounter (INDEPENDENT_AMBULATORY_CARE_PROVIDER_SITE_OTHER): Payer: Self-pay | Admitting: Internal Medicine

## 2018-06-13 ENCOUNTER — Other Ambulatory Visit (INDEPENDENT_AMBULATORY_CARE_PROVIDER_SITE_OTHER): Payer: Medicare Other | Attending: Internal Medicine

## 2018-06-13 VITALS — BP 123/79 | HR 88 | Temp 98.5°F | Resp 18

## 2018-06-13 DIAGNOSIS — M12811 Other specific arthropathies, not elsewhere classified, right shoulder: Secondary | ICD-10-CM

## 2018-06-13 DIAGNOSIS — G35 Multiple sclerosis: Secondary | ICD-10-CM

## 2018-06-13 DIAGNOSIS — M75101 Unspecified rotator cuff tear or rupture of right shoulder, not specified as traumatic: Secondary | ICD-10-CM

## 2018-06-13 DIAGNOSIS — E039 Hypothyroidism, unspecified: Secondary | ICD-10-CM | POA: Insufficient documentation

## 2018-06-13 DIAGNOSIS — Z79899 Other long term (current) drug therapy: Secondary | ICD-10-CM

## 2018-06-13 DIAGNOSIS — D8989 Other specified disorders involving the immune mechanism, not elsewhere classified: Secondary | ICD-10-CM

## 2018-06-13 DIAGNOSIS — H209 Unspecified iridocyclitis: Secondary | ICD-10-CM

## 2018-06-13 LAB — COMPREHENSIVE METABOLIC PANEL, BLOOD
ALT (SGPT): 40 U/L — ABNORMAL HIGH (ref 0–33)
AST (SGOT): 56 U/L — ABNORMAL HIGH (ref 0–32)
Albumin: 4.7 g/dL (ref 3.5–5.2)
Alkaline Phos: 49 U/L (ref 35–140)
Anion Gap: 14 mmol/L (ref 7–15)
BUN: 20 mg/dL (ref 6–20)
Bicarbonate: 27 mmol/L (ref 22–29)
Bilirubin, Tot: 0.43 mg/dL (ref <1.2)
Calcium: 9.9 mg/dL (ref 8.5–10.6)
Chloride: 100 mmol/L (ref 98–107)
Creatinine: 0.78 mg/dL (ref 0.51–0.95)
GFR: >60 mL/min
Glucose: 82 mg/dL (ref 70–99)
Potassium: 4.8 mmol/L (ref 3.5–5.1)
Sodium: 141 mmol/L (ref 136–145)
Total Protein: 7.6 g/dL (ref 6.0–8.0)

## 2018-06-13 LAB — CBC WITH DIFF, BLOOD
ANC-Automated: 5.6 10*3/uL (ref 1.6–7.0)
Abs Basophils: 0.1 10*3/uL (ref <0.1)
Abs Eosinophils: 0.1 10*3/uL (ref 0.1–0.5)
Abs Lymphs: 1.9 10*3/uL (ref 0.8–3.1)
Abs Monos: 0.7 10*3/uL (ref 0.2–0.8)
Basophils: 1 %
Eosinophils: 2 %
Hct: 39.8 % (ref 34.0–45.0)
Hgb: 13.1 gm/dL (ref 11.2–15.7)
Lymphocytes: 23 %
MCH: 30.5 pg (ref 26.0–32.0)
MCHC: 32.9 g/dL (ref 32.0–36.0)
MCV: 92.8 um3 (ref 79.0–95.0)
MPV: 9.8 fL (ref 9.4–12.4)
Monocytes: 8 %
Plt Count: 290 10*3/uL (ref 140–370)
RBC: 4.29 10*6/uL (ref 3.90–5.20)
RDW: 12.1 % (ref 12.0–14.0)
Segs: 67 %
WBC: 8.3 10*3/uL (ref 4.0–10.0)

## 2018-06-13 LAB — TSH, BLOOD: TSH: 0.47 u[IU]/mL (ref 0.27–4.20)

## 2018-06-13 LAB — C-REACTIVE PROTEIN, BLOOD: CRP: 0.04 mg/dL (ref <0.5)

## 2018-06-13 LAB — IMMUNOGLOBULIN PANEL (IGA,IGG,IGM), BLOOD
IGA: 554 mg/dL — ABNORMAL HIGH (ref 70–400)
IGG: 1036 mg/dL (ref 700–1600)
IGM: 32 mg/dL — ABNORMAL LOW (ref 40–230)

## 2018-06-13 LAB — SED RATE, BLOOD: Sed Rate: 4 mm/hr (ref 0–30)

## 2018-06-13 NOTE — Progress Notes (Signed)
Referring Provider:  Edi, Verl Blalock, MD  [address]  [place], [place] [address]    Chief Complaint  Chief Complaint   Patient presents with   . Recheck   1. Multiple Sclerosis, relapsing form  2. Anterior uveitis  3. IgG related lymphadenopathy, chronic sialoadenitis    History of present Illness  58 year old female with history of multiple sclerosis diagnosed 1991, presented with numbness in both legs, seizures of 4 years duration.  Multiple relapses since then.  Previously treated with Copaxone.  Started Natalizumab in 2014 with good response and improvement in pain and numbness in the extremities.  Currently taking Cymbalta and gabapentin which appear to help with pain in her extremities.    History of anterior uveitis for 1 year, treated with topical corticosteroids.  Seen by Ophthalmology at [facility].  Previous labs from [place] from April 2017 revealed normal SSA, SSB, QuantiFERON TB, cell counts, CMP, inflammatory markers.    She is a chronic heavy smoker, about a year ago she had change in voice since cervical lymphadenopathy.  Underwent removal of enlarged tonsil and cervical lymph node to rule out malignancy.  Biopsy was negative for malignancy, however suggested IgG4 related disease with 50 cells per high-power field in lymph node with IgG4 staining.  Workup for systemic disease was done with CT chest abdomen pelvis 08/11/2015 which was negative for lymphadenopathy and for retroperitoneal fibrosis.  No pancreatitis.  Neck ultrasound showed bilateral submandibular gland enlargement with heterogeneous echotexture, likely secondary to IgG4 related disease    Switched from natalizumab to rituximab for multiple sclerosis, in light of history of uveitis and IgG4 related disease, by Neurology.  Received rituximab 500 mg IV 2 doses 2 weeks apart in March 2019 and September 2019    Tolerated rituximab well.  Overall feels better with respect to energy levels, submandibular pain and swelling.   Abdominal pain has resolved.  Abdominal ultrasound was normal.    Recurrent right subacromial bursitis status post bursa injection on multiple occasions with good response.  Significant tendinopathy on most recent right shoulder MRI, notes following to the right shoulder in January 2020.  Shoulder pain worse since then, undergoing physical therapy with good benefit. Doing yoga regularly, which has helped her pain tremendously.  No other joints with pain swelling or stiffness.    No unexpected appetite or weight changes.  No persistent fever.      Skin: No photosensitivity, rash or ulceration. No skin psoriasis  Hair: No unusual hair loss  Oral: No oral ulceration or sore throat   Eyes:  Per HPI  Ears: No changes in hearing, ear pain or discharge  Abd: No discomfort. No change in bowel habits  Resp: No difficulty breathing, chest pain or chronic cough  Genitourinary: No difficulty or pain with urination  Systemic: No fever, malaise, changes in weight or appetite. No h/o recurrent infections  Neurological:  Per HPI  Extremities: No h/o Raynaud's phenomenon. No other joints with swelling, pain or redness    Review of Systems  As per HPI    Medications and Allergies were reviewed with the patient.       Current Outpatient Medications:   .  albuterol (VENTOLIN HFA) 108 (90 Base) MCG/ACT inhaler, Inhale 2 puffs by mouth every 6 hours as needed for Wheezing., Disp: 3 Inhaler, Rfl: 3  .  Amantadine HCl 100 MG tablet, Take 100 mg by mouth 2 times daily., Disp: 60 tablet, Rfl: 0  .  ARMOUR THYROID 15 MG tablet, TAKE 1 TABLET (15 MG) BY MOUTH EVERY OTHER DAY., Disp: 45 tablet, Rfl: 0  .  baclofen (LIORESAL) 10 MG tablet, TAKE 1 TABLET BY MOUTH THREE TIMES A DAY, Disp: 90 tablet, Rfl: 11  .  buPROPion (WELLBUTRIN) 75 MG tablet, Take 1 tablet (75 mg) by mouth 2 times daily., Disp: 60 tablet, Rfl: 5  .  diazepam (VALIUM) 5 MG tablet, Take 1 tablet (5 mg) by mouth daily as needed for Anxiety., Disp: 30 tablet, Rfl: 1  .   DULoxetine (CYMBALTA) 60 MG CR capsule, Take 2 capsules (120 mg) by mouth daily., Disp: 180 capsule, Rfl: 3  .  gabapentin (NEURONTIN) 300 MG capsule, Take 2 capsules (600 mg) by mouth nightly., Disp: 60 capsule, Rfl: 5  .  modafinil (PROVIGIL) 200 MG tablet, Take 1 tablet (200 mg) by mouth every morning., Disp: 30 tablet, Rfl: 0  .  nortriptyline (PAMELOR) 25 MG capsule, Take 2 capsules (50 mg) by mouth nightly., Disp: 60 capsule, Rfl: 5  .  prednisoLONE (ORAPRED) 15 MG/5ML solution, Take 15 mg by mouth daily., Disp: , Rfl:   .  prednisoLONE acetate (PRED FORTE) 1 % ophthalmic suspension, Place 1 drop into both eyes 4 times daily., Disp: 1 bottle, Rfl: 3  .  thyroid (ARMOUR THYROID) 60 MG tablet, Take 1 tablet (60 mg) by mouth daily., Disp: 90 tablet, Rfl: 0  .  tiZANidine (ZANAFLEX) 4 MG tablet, Take 1.5 tablets (6 mg) by mouth at bedtime., Disp: 45 tablet, Rfl: 5    Allergies  Penicillins and Modafinil    Past Medical History:   Diagnosis Date   . Asthma 1991    with pregnancy   . Hypothyroidism    . Major depressive disorder, single episode    . MS (multiple sclerosis) (CMS-HCC)        Past Surgical History:   Procedure Laterality Date   . leep      for cervical dysplasia   . LYMPH NODE BIOPSY      IgG4 related disease   . TONSILLECTOMY AND ADENOIDECTOMY      only tonsillectomy       Family History   Problem Relation Name Age of Onset   . Other Mother Zara Chess    . Thyroid Mother Zara Chess    . Heart Disease Mother Zara Chess         heart failure   . Cancer Father don mc clain         lung   . Cancer Brother tom mcclain    . Heart Disease Brother tom mcclain         heart attack   . Cancer Sister Zara Chess         breast   . Diabetes Sister suzanne mcclain    . Thyroid Sister suzanne mcclain         car accident   . Stroke Brother [place]    . Cancer Brother Jeremy Johann         prostate cancer   . Cancer Brother john mc clain         copd lungs       Social History     Socioeconomic History   .  Marital status: Divorced     Spouse name: Not on file   . Number of children: Not on file   . Years of education: Not on file   . Highest education level: Not on  file   Occupational History   . Not on file   Social Needs   . Financial resource strain: Not on file   . Food insecurity:     Worry: Not on file     Inability: Not on file   . Transportation needs:     Medical: Not on file     Non-medical: Not on file   Tobacco Use   . Smoking status: Former Smoker     Packs/day: 1.00     Years: 15.00     Pack years: 15.00     Types: Cigarettes     Last attempt to quit: 01/27/2016     Years since quitting: 2.3   . Smokeless tobacco: Never Used   Substance and Sexual Activity   . Alcohol use: Yes     Alcohol/week: 2.0 standard drinks     Types: 2 Glasses of wine per week   . Drug use: Not on file   . Sexual activity: Yes     Partners: Male     Birth control/protection: Rhythm   Lifestyle   . Physical activity:     Days per week: Not on file     Minutes per session: Not on file   . Stress: Not on file   Relationships   . Social connections:     Talks on phone: Not on file     Gets together: Not on file     Attends religious service: Not on file     Active member of club or organization: Not on file     Attends meetings of clubs or organizations: Not on file     Relationship status: Not on file   . Intimate partner violence:     Fear of current or ex partner: Not on file     Emotionally abused: Not on file     Physically abused: Not on file     Forced sexual activity: Not on file   Other Topics Concern   . Military Service No   . Blood Transfusions No   . Caffeine Concern No   . Occupational Exposure No   . Hobby Hazards No   . Sleep Concern No   . Stress Concern No   . Weight Concern No   . Special Diet No   . Back Care No   . Exercise Not Asked   . Bike Helmet Not Asked   . Seat Belt Not Asked   . Self-Exams Not Asked   . Exercises Regularly Yes   . Bike Helmet Use Yes   . Seat Belt Use Yes   . Performs Self-Exams Yes    Social History Narrative    [place], she is very conscientious of effect of diet on health.    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    Hasn't had a cigarette in 8 months.    Has smoked 26 years, less than a pack a day.    Currently drinks 2-4 drinks a day on the weekends, total 4-8 per week.    Sexually active with one female partner.    Currently not working, has been on disability for most of the time from 1992 to now.         Physical Exam  Vitals:    06/13/18 1350   BP: 123/79   BP Location: Left arm   BP Patient Position: Sitting   BP cuff size: Regular  Pulse: 88   Resp: 18   Temp: 98.5 F (36.9 C)   TempSrc: Oral     Gen: Alert, cooperative  Psych: Appropriate affect  Neck:  Prominent submandibular and parotid glands.  No discrete lymph nodes palpable  Skin: No rheumatologic rashes or ulcers  Eyes: No conjunctival erythema, EOMI. No scleral icterus  ENT: No oral mucositis, no sinus tenderness, no ear discharge  CV: Regular rhythm, normal rate  Resp: Breathing comfortably, no tachypnea  Abd: Soft, mild epigastric tenderness.  Joint exam: A complete joint exam revealed pain or disease state abduction at right shoulder. No active synovitis in the remaining upper or lower extremity joints. Good range of motion in shoulder and pelvic girdles  Neuro: Oriented to time, place and person         Most recent labs and imaging were reviewed and discussed with the patient.  50 pages of outside records reviewed, outlined in HPI    08/08/2015  IgG4 level 215 upper limit 118m/dl  Total IgG 1050 upper limit 1590    01/12/17  IgG4 level 193  03/29/17:  IgG4 elevated at 205.  Normal IgG    03/2018 IgG4 163, normal IgG    Impression    ICD-10-CM ICD-9-CM   1. 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 Multiple sclerosis (CMS-HCC) G35 340   3. Uveitis of right eye H20.9 364.3   4. High risk medication use Z79.899 V58.69   5. Right rotator cuff tear arthropathy M75.101 716.81    M12.811         Areebah KDuplessisis a 58year old female with multiple sclerosis, followed by Neurology.  Previously well controlled on treatment with natalizumab.    Switch to rituximab in March 2019 due to history of IgG4 related disease, with cervical lymphadenopathy, submandibular gland enlargement and recurrent uveitis of right eye.  Chronic nerve pain well controlled with Cymbalta and gabapentin.    IgG4 related disease on histopathology of enlarged right cervical lymph nodes.  No pancreatitis, no other systemic lymphadenopathy on CT chest abdomen pelvis performed April 2017.    History 2 episodes of anterior uveitis in 2018, no recurrence.  Negative ANA HLA B27, Qft TB    Last rituximab September 2019.  Clinically stable, no worsening lymphadenopathy    PLAN:  - consider repeat rituximab 1 g single dose in March 2020 for maintenance  - repeat immunoglobulin and B-cell levels today  - continue physical therapy for right rotator cuff tendinopathy    Orders Placed This Encounter   Procedures   . CBC w/ Diff Lavender   . Comprehensive Metabolic Panel Green Plasma Separator Tube   . C-Reactive Protein, Blood Green Plasma Separator Tube   . Sedimentation Rate (ESR), Blood Lavender   . Total B Cells (CD20), Blood Lavender   . IgG Subclass, Blood Yellow serum separator tube   . Ig Panel (IgA, IgG, IgM) - See Instructions        Return in about 3 months (around 09/11/2018).

## 2018-06-14 LAB — TOTAL B CELL (CD19), BLOOD
CD19 B-Cell %: <1 % — ABNORMAL LOW (ref 6–23)
CD19 B-Cell Abs: <21 cells/uL — ABNORMAL LOW (ref 50–350)

## 2018-06-14 NOTE — Telephone Encounter (Signed)
Tsh drawn yesterday Dr. Rana Snare.

## 2018-06-15 ENCOUNTER — Telehealth (INDEPENDENT_AMBULATORY_CARE_PROVIDER_SITE_OTHER): Payer: Self-pay | Admitting: Family Practice

## 2018-06-15 ENCOUNTER — Telehealth (INDEPENDENT_AMBULATORY_CARE_PROVIDER_SITE_OTHER): Payer: Self-pay | Admitting: Internal Medicine

## 2018-06-15 LAB — TOTAL B CELLS (CD20), BLOOD
B Cells (CD20) %: <1 cell/uL — ABNORMAL LOW (ref 6–23)
B Cells (CD20) Absolute: <21 cells/uL — ABNORMAL LOW (ref 50–350)

## 2018-06-15 NOTE — Progress Notes (Signed)
H/o of intermediate uveitis OD managed by systemic treatment for MS/IgG4 disease (Rituxan)    H/o of amblyopia OD: poor vision as child.    Posterior polar cataract OD: congenital?    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     OCT mac w/o edema ou.

## 2018-06-15 NOTE — Telephone Encounter (Signed)
Please let her know her TSH was normal  Thanks!

## 2018-06-15 NOTE — Telephone Encounter (Signed)
Rec'd referral for "Tear of right rotator cuff, unspecified tear extent, unspecified whether traumatic ".  Spoke w/ patient who would like to try PT first which is scheduled 3/6.  She will contact us after a few weeks of PT.

## 2018-06-16 LAB — IGG SUBCLASS PANEL, BLOOD
IGG Subclass 1: 565 mg/dL (ref 240–1118)
IGG Subclass 2: 380 mg/dL (ref 124–549)
IGG Subclass 3: 15 mg/dL — ABNORMAL LOW (ref 21–134)
IGG Subclass 4: 151 mg/dL — ABNORMAL HIGH (ref 1–123)

## 2018-06-20 NOTE — Telephone Encounter (Signed)
Sent patient a Pharmacist, community message with Dr. Aaron Edelman message. No further action required will close encounter.

## 2018-06-21 ENCOUNTER — Encounter (INDEPENDENT_AMBULATORY_CARE_PROVIDER_SITE_OTHER): Payer: Self-pay | Admitting: Internal Medicine

## 2018-06-22 DIAGNOSIS — M5090 Cervical disc disorder, unspecified, unspecified cervical region: Secondary | ICD-10-CM | POA: Diagnosis not present

## 2018-06-22 DIAGNOSIS — I2510 Atherosclerotic heart disease of native coronary artery without angina pectoris: Secondary | ICD-10-CM | POA: Diagnosis not present

## 2018-06-22 DIAGNOSIS — G894 Chronic pain syndrome: Secondary | ICD-10-CM | POA: Diagnosis not present

## 2018-06-22 DIAGNOSIS — Z8781 Personal history of (healed) traumatic fracture: Secondary | ICD-10-CM | POA: Diagnosis not present

## 2018-06-22 DIAGNOSIS — Z79891 Long term (current) use of opiate analgesic: Secondary | ICD-10-CM | POA: Diagnosis not present

## 2018-06-22 DIAGNOSIS — G5600 Carpal tunnel syndrome, unspecified upper limb: Secondary | ICD-10-CM | POA: Diagnosis not present

## 2018-06-22 DIAGNOSIS — Z720 Tobacco use: Secondary | ICD-10-CM | POA: Diagnosis not present

## 2018-06-22 DIAGNOSIS — M546 Pain in thoracic spine: Secondary | ICD-10-CM | POA: Diagnosis not present

## 2018-06-22 DIAGNOSIS — F1120 Opioid dependence, uncomplicated: Secondary | ICD-10-CM | POA: Diagnosis not present

## 2018-06-23 ENCOUNTER — Encounter (INDEPENDENT_AMBULATORY_CARE_PROVIDER_SITE_OTHER): Payer: Self-pay | Admitting: Internal Medicine

## 2018-06-27 ENCOUNTER — Encounter (INDEPENDENT_AMBULATORY_CARE_PROVIDER_SITE_OTHER): Payer: Self-pay | Admitting: Internal Medicine

## 2018-06-27 DIAGNOSIS — D8989 Other specified disorders involving the immune mechanism, not elsewhere classified: Principal | ICD-10-CM

## 2018-06-27 NOTE — Telephone Encounter (Signed)
From: Debra Bolton  To: Johny Shears, MD  Sent: 06/23/2018 5:23 PM PST  Subject: JN:PVFA results    Yes I am. Thanks Debra Bolton  ----- Message -----  From: Johny Shears, MD  Sent: 06/23/2018 9:12 AM PST  To: Debra Bolton  Subject: Test results  Your B cells are still pretty low. Are you okay waiting 2 months before repeating the infusion?    Sincerely  Kathrynn Humble, MD

## 2018-07-01 ENCOUNTER — Encounter (HOSPITAL_BASED_OUTPATIENT_CLINIC_OR_DEPARTMENT_OTHER): Payer: Self-pay | Admitting: Neurology

## 2018-07-05 DIAGNOSIS — M545 Low back pain: Secondary | ICD-10-CM | POA: Diagnosis not present

## 2018-07-05 DIAGNOSIS — M5136 Other intervertebral disc degeneration, lumbar region: Secondary | ICD-10-CM | POA: Diagnosis not present

## 2018-07-07 DIAGNOSIS — Z951 Presence of aortocoronary bypass graft: Secondary | ICD-10-CM | POA: Diagnosis not present

## 2018-07-07 DIAGNOSIS — I2510 Atherosclerotic heart disease of native coronary artery without angina pectoris: Secondary | ICD-10-CM | POA: Diagnosis not present

## 2018-07-07 DIAGNOSIS — I10 Essential (primary) hypertension: Secondary | ICD-10-CM | POA: Diagnosis not present

## 2018-07-07 DIAGNOSIS — E785 Hyperlipidemia, unspecified: Secondary | ICD-10-CM | POA: Diagnosis not present

## 2018-07-07 DIAGNOSIS — I6523 Occlusion and stenosis of bilateral carotid arteries: Secondary | ICD-10-CM | POA: Diagnosis not present

## 2018-07-10 NOTE — Telephone Encounter (Signed)
From: [name]  To: Zoltan Genest Kristen Loader, MD  Sent: 07/01/2018 10:38 AM PDT  Subject: 1-Non Urgent Medical Advice    Dear Dr. Rosendo Gros,   With regards to my B cells and immune system due to the infusions, should I be staying home altogether to avoid this coronavirus issue?   Just want to be proactive but not to paranoid.  Thanks [name]

## 2018-07-20 DIAGNOSIS — G894 Chronic pain syndrome: Secondary | ICD-10-CM | POA: Diagnosis not present

## 2018-07-20 DIAGNOSIS — G5600 Carpal tunnel syndrome, unspecified upper limb: Secondary | ICD-10-CM | POA: Diagnosis not present

## 2018-07-20 DIAGNOSIS — Z79891 Long term (current) use of opiate analgesic: Secondary | ICD-10-CM | POA: Diagnosis not present

## 2018-07-20 DIAGNOSIS — I2510 Atherosclerotic heart disease of native coronary artery without angina pectoris: Secondary | ICD-10-CM | POA: Diagnosis not present

## 2018-07-20 DIAGNOSIS — F1120 Opioid dependence, uncomplicated: Secondary | ICD-10-CM | POA: Diagnosis not present

## 2018-07-20 DIAGNOSIS — Z720 Tobacco use: Secondary | ICD-10-CM | POA: Diagnosis not present

## 2018-07-20 DIAGNOSIS — M546 Pain in thoracic spine: Secondary | ICD-10-CM | POA: Diagnosis not present

## 2018-07-20 DIAGNOSIS — Z8781 Personal history of (healed) traumatic fracture: Secondary | ICD-10-CM | POA: Diagnosis not present

## 2018-07-20 DIAGNOSIS — M5090 Cervical disc disorder, unspecified, unspecified cervical region: Secondary | ICD-10-CM | POA: Diagnosis not present

## 2018-07-24 ENCOUNTER — Encounter (INDEPENDENT_AMBULATORY_CARE_PROVIDER_SITE_OTHER): Payer: Medicare Other | Admitting: Family Medicine

## 2018-07-28 ENCOUNTER — Encounter (INDEPENDENT_AMBULATORY_CARE_PROVIDER_SITE_OTHER): Payer: Self-pay | Admitting: Internal Medicine

## 2018-08-02 ENCOUNTER — Other Ambulatory Visit (INDEPENDENT_AMBULATORY_CARE_PROVIDER_SITE_OTHER): Payer: Medicare Other | Attending: Internal Medicine

## 2018-08-02 DIAGNOSIS — D8989 Other specified disorders involving the immune mechanism, not elsewhere classified: Secondary | ICD-10-CM | POA: Insufficient documentation

## 2018-08-02 LAB — CBC WITH DIFF, BLOOD
ANC-Automated: 3.7 10*3/uL (ref 1.6–7.0)
Abs Basophils: 0 10*3/uL (ref <0.1)
Abs Eosinophils: 0.2 10*3/uL (ref 0.1–0.5)
Abs Lymphs: 2.9 10*3/uL (ref 0.8–3.1)
Abs Monos: 0.7 10*3/uL (ref 0.2–0.8)
Basophils: 0 %
Eosinophils: 2 %
Hct: 39.3 % (ref 34.0–45.0)
Hgb: 12.8 gm/dL (ref 11.2–15.7)
Lymphocytes: 39 %
MCH: 30 pg (ref 26.0–32.0)
MCHC: 32.6 g/dL (ref 32.0–36.0)
MCV: 92.3 um3 (ref 79.0–95.0)
MPV: 9.7 fL (ref 9.4–12.4)
Monocytes: 9 %
Plt Count: 296 10*3/uL (ref 140–370)
RBC: 4.26 10*6/uL (ref 3.90–5.20)
RDW: 11.8 % — ABNORMAL LOW (ref 12.0–14.0)
Segs: 49 %
WBC: 7.4 10*3/uL (ref 4.0–10.0)

## 2018-08-02 LAB — COMPREHENSIVE METABOLIC PANEL, BLOOD
ALT (SGPT): 21 U/L (ref 0–33)
AST (SGOT): 29 U/L (ref 0–32)
Albumin: 4.7 g/dL (ref 3.5–5.2)
Alkaline Phos: 47 U/L (ref 35–140)
Anion Gap: 16 mmol/L — ABNORMAL HIGH (ref 7–15)
BUN: 13 mg/dL (ref 6–20)
Bicarbonate: 26 mmol/L (ref 22–29)
Bilirubin, Tot: 0.32 mg/dL (ref <1.2)
Calcium: 10.1 mg/dL (ref 8.5–10.6)
Chloride: 100 mmol/L (ref 98–107)
Creatinine: 0.78 mg/dL (ref 0.51–0.95)
GFR: >60 mL/min
Glucose: 91 mg/dL (ref 70–99)
Potassium: 4.1 mmol/L (ref 3.5–5.1)
Sodium: 142 mmol/L (ref 136–145)
Total Protein: 7.3 g/dL (ref 6.0–8.0)

## 2018-08-02 LAB — SED RATE, BLOOD: Sed Rate: 3 mm/hr (ref 0–30)

## 2018-08-02 LAB — IGE, BLOOD: IGE: 60 [IU]/mL (ref 0–99)

## 2018-08-02 LAB — C-REACTIVE PROTEIN, BLOOD: CRP: 0.04 mg/dL (ref <0.5)

## 2018-08-03 LAB — TOTAL B CELLS (CD20), BLOOD
B Cells (CD20) %: <1 cell/uL — ABNORMAL LOW (ref 6–23)
B Cells (CD20) Absolute: <28 cells/uL — ABNORMAL LOW (ref 50–350)

## 2018-08-03 LAB — TOTAL B CELL (CD19), BLOOD
CD19 B-Cell %: <1 % — ABNORMAL LOW (ref 6–23)
CD19 B-Cell Abs: <28 cells/uL — ABNORMAL LOW (ref 50–350)

## 2018-08-04 ENCOUNTER — Encounter (INDEPENDENT_AMBULATORY_CARE_PROVIDER_SITE_OTHER): Payer: Self-pay | Admitting: Internal Medicine

## 2018-08-04 LAB — IGG SUBCLASS PANEL, BLOOD
IGG Subclass 1: 494 mg/dL (ref 240–1118)
IGG Subclass 2: 397 mg/dL (ref 124–549)
IGG Subclass 3: 14 mg/dL — ABNORMAL LOW (ref 21–134)
IGG Subclass 4: 132 mg/dL — ABNORMAL HIGH (ref 1–123)

## 2018-08-07 DIAGNOSIS — J449 Chronic obstructive pulmonary disease, unspecified: Secondary | ICD-10-CM | POA: Diagnosis not present

## 2018-08-07 DIAGNOSIS — I10 Essential (primary) hypertension: Secondary | ICD-10-CM | POA: Diagnosis not present

## 2018-08-07 DIAGNOSIS — F17203 Nicotine dependence unspecified, with withdrawal: Secondary | ICD-10-CM | POA: Diagnosis not present

## 2018-08-07 DIAGNOSIS — I2510 Atherosclerotic heart disease of native coronary artery without angina pectoris: Secondary | ICD-10-CM | POA: Diagnosis not present

## 2018-08-07 DIAGNOSIS — E782 Mixed hyperlipidemia: Secondary | ICD-10-CM | POA: Diagnosis not present

## 2018-08-07 DIAGNOSIS — G8929 Other chronic pain: Secondary | ICD-10-CM | POA: Diagnosis not present

## 2018-08-08 ENCOUNTER — Encounter (INDEPENDENT_AMBULATORY_CARE_PROVIDER_SITE_OTHER): Payer: Self-pay | Admitting: Hospital

## 2018-08-24 DIAGNOSIS — F1120 Opioid dependence, uncomplicated: Secondary | ICD-10-CM | POA: Diagnosis not present

## 2018-08-24 DIAGNOSIS — I2510 Atherosclerotic heart disease of native coronary artery without angina pectoris: Secondary | ICD-10-CM | POA: Diagnosis not present

## 2018-08-24 DIAGNOSIS — G894 Chronic pain syndrome: Secondary | ICD-10-CM | POA: Diagnosis not present

## 2018-08-24 DIAGNOSIS — M546 Pain in thoracic spine: Secondary | ICD-10-CM | POA: Diagnosis not present

## 2018-08-24 DIAGNOSIS — Z720 Tobacco use: Secondary | ICD-10-CM | POA: Diagnosis not present

## 2018-08-24 DIAGNOSIS — M5090 Cervical disc disorder, unspecified, unspecified cervical region: Secondary | ICD-10-CM | POA: Diagnosis not present

## 2018-08-24 DIAGNOSIS — Z8781 Personal history of (healed) traumatic fracture: Secondary | ICD-10-CM | POA: Diagnosis not present

## 2018-08-24 DIAGNOSIS — G5600 Carpal tunnel syndrome, unspecified upper limb: Secondary | ICD-10-CM | POA: Diagnosis not present

## 2018-08-24 DIAGNOSIS — Z79891 Long term (current) use of opiate analgesic: Secondary | ICD-10-CM | POA: Diagnosis not present

## 2018-08-31 ENCOUNTER — Other Ambulatory Visit (INDEPENDENT_AMBULATORY_CARE_PROVIDER_SITE_OTHER): Payer: Self-pay | Admitting: Psychiatric/Mental Health

## 2018-08-31 DIAGNOSIS — M797 Fibromyalgia: Secondary | ICD-10-CM

## 2018-08-31 MED ORDER — NORTRIPTYLINE HCL 25 MG OR CAPS
ORAL_CAPSULE | ORAL | 2 refills | Status: DC
Start: 2018-08-31 — End: 2018-11-24

## 2018-09-04 ENCOUNTER — Other Ambulatory Visit (INDEPENDENT_AMBULATORY_CARE_PROVIDER_SITE_OTHER): Payer: Self-pay | Admitting: Psychiatric/Mental Health

## 2018-09-04 DIAGNOSIS — F322 Major depressive disorder, single episode, severe without psychotic features: Secondary | ICD-10-CM

## 2018-09-04 MED ORDER — BUPROPION HCL 75 MG OR TABS
75.0000 mg | ORAL_TABLET | Freq: Two times a day (BID) | ORAL | 1 refills | Status: DC
Start: 2018-09-04 — End: 2019-02-08

## 2018-09-04 NOTE — Telephone Encounter (Signed)
Refill request from pharmacy for 90day supply on Bupropion 75mg  sig: one tab po BID. "[facility] is now requiring a 90day supply."   New order pended.    Last visit: 05/02/2018  Next visit: 10/25/2018    [id number] - [place], [place]

## 2018-09-05 ENCOUNTER — Other Ambulatory Visit (INDEPENDENT_AMBULATORY_CARE_PROVIDER_SITE_OTHER): Payer: Medicare Other | Attending: Internal Medicine

## 2018-09-05 DIAGNOSIS — D8989 Other specified disorders involving the immune mechanism, not elsewhere classified: Secondary | ICD-10-CM | POA: Insufficient documentation

## 2018-09-05 LAB — CBC WITH DIFF, BLOOD
ANC-Automated: 2.9 10*3/uL (ref 1.6–7.0)
Abs Basophils: 0 10*3/uL (ref <0.1)
Abs Eosinophils: 0.2 10*3/uL (ref 0.1–0.5)
Abs Lymphs: 2.5 10*3/uL (ref 0.8–3.1)
Abs Monos: 0.5 10*3/uL (ref 0.2–0.8)
Basophils: 1 %
Eosinophils: 4 %
Hct: 40.5 % (ref 34.0–45.0)
Hgb: 13.3 gm/dL (ref 11.2–15.7)
Lymphocytes: 40 %
MCH: 30.5 pg (ref 26.0–32.0)
MCHC: 32.8 g/dL (ref 32.0–36.0)
MCV: 92.9 um3 (ref 79.0–95.0)
MPV: 10 fL (ref 9.4–12.4)
Monocytes: 8 %
Plt Count: 260 10*3/uL (ref 140–370)
RBC: 4.36 10*6/uL (ref 3.90–5.20)
RDW: 11.7 % — ABNORMAL LOW (ref 12.0–14.0)
Segs: 47 %
WBC: 6.1 10*3/uL (ref 4.0–10.0)

## 2018-09-05 LAB — COMPREHENSIVE METABOLIC PANEL, BLOOD
ALT (SGPT): 20 U/L (ref 0–33)
AST (SGOT): 25 U/L (ref 0–32)
Albumin: 4.6 g/dL (ref 3.5–5.2)
Alkaline Phos: 53 U/L (ref 35–140)
Anion Gap: 14 mmol/L (ref 7–15)
BUN: 15 mg/dL (ref 6–20)
Bicarbonate: 27 mmol/L (ref 22–29)
Bilirubin, Tot: 0.39 mg/dL (ref <1.2)
Calcium: 10 mg/dL (ref 8.5–10.6)
Chloride: 104 mmol/L (ref 98–107)
Creatinine: 0.88 mg/dL (ref 0.51–0.95)
GFR: >60 mL/min
Glucose: 91 mg/dL (ref 70–99)
Potassium: 4.4 mmol/L (ref 3.5–5.1)
Sodium: 145 mmol/L (ref 136–145)
Total Protein: 7.4 g/dL (ref 6.0–8.0)

## 2018-09-05 LAB — C-REACTIVE PROTEIN, BLOOD: CRP: 0.03 mg/dL (ref <0.5)

## 2018-09-05 LAB — IGE, BLOOD: IGE: 68 [IU]/mL (ref 0–99)

## 2018-09-05 LAB — SED RATE, BLOOD: Sed Rate: 7 mm/hr (ref 0–30)

## 2018-09-06 LAB — TOTAL B CELLS (CD20), BLOOD
B Cells (CD20) %: <1 cell/uL — ABNORMAL LOW (ref 6–23)
B Cells (CD20) Absolute: <25 cells/uL — ABNORMAL LOW (ref 50–350)

## 2018-09-06 LAB — TOTAL B CELL (CD19), BLOOD
CD19 B-Cell %: <1 % — ABNORMAL LOW (ref 6–23)
CD19 B-Cell Abs: <25 cells/uL — ABNORMAL LOW (ref 50–350)

## 2018-09-07 ENCOUNTER — Encounter (INDEPENDENT_AMBULATORY_CARE_PROVIDER_SITE_OTHER): Payer: Self-pay | Admitting: Internal Medicine

## 2018-09-07 LAB — IGG SUBCLASS PANEL, BLOOD
IGG Subclass 1: 535 mg/dL (ref 240–1118)
IGG Subclass 2: 383 mg/dL (ref 124–549)
IGG Subclass 3: 14 mg/dL — ABNORMAL LOW (ref 21–134)
IGG Subclass 4: 150 mg/dL — ABNORMAL HIGH (ref 1–123)

## 2018-09-12 ENCOUNTER — Encounter (INDEPENDENT_AMBULATORY_CARE_PROVIDER_SITE_OTHER): Payer: Self-pay | Admitting: Internal Medicine

## 2018-09-12 ENCOUNTER — Telehealth (HOSPITAL_BASED_OUTPATIENT_CLINIC_OR_DEPARTMENT_OTHER): Payer: Self-pay

## 2018-09-12 ENCOUNTER — Telehealth: Payer: Self-pay | Admitting: Hospital

## 2018-09-12 ENCOUNTER — Telehealth (INDEPENDENT_AMBULATORY_CARE_PROVIDER_SITE_OTHER): Payer: Medicare Other | Admitting: Internal Medicine

## 2018-09-12 DIAGNOSIS — M2550 Pain in unspecified joint: Secondary | ICD-10-CM

## 2018-09-12 DIAGNOSIS — G35 Multiple sclerosis: Secondary | ICD-10-CM

## 2018-09-12 DIAGNOSIS — H209 Unspecified iridocyclitis: Secondary | ICD-10-CM

## 2018-09-12 DIAGNOSIS — R1013 Epigastric pain: Secondary | ICD-10-CM

## 2018-09-12 DIAGNOSIS — Z79899 Other long term (current) drug therapy: Secondary | ICD-10-CM

## 2018-09-12 DIAGNOSIS — D8989 Other specified disorders involving the immune mechanism, not elsewhere classified: Secondary | ICD-10-CM

## 2018-09-12 DIAGNOSIS — K1120 Sialoadenitis, unspecified: Secondary | ICD-10-CM

## 2018-09-12 NOTE — Telephone Encounter (Signed)
Returned pt's call, to schedule RITUXAN at our [facility] office. Made an apt for Monday 09/118.

## 2018-09-12 NOTE — Telephone Encounter (Signed)
Patient called to schedule an infusion center appointment.    Request forwarded to Scheduling Coordinators.    Patient's phone: [phone]  She was advised call will be returned by end of day.

## 2018-09-12 NOTE — Progress Notes (Signed)
Due to the COVID-19 pandemic and high risk of community-acquired infection with patient travel, I am proceeding with this telemedicine evaluation at the request of the patient.     Patient Verification & Telemedicine Consent:    I am proceeding with this evaluation at the direct request of the patient.  I have verified this is the correct patient and have obtained verbal consent and written consent from the patient/ surrogate to perform this voluntary telemedicine evaluation (including obtaining history, performing examination and reviewing data provided by the patient).   The patient/ surrogate has the right to refuse this evaluation.  I have explained risks (including potential loss of confidentiality), benefits, alternatives, and the potential need for subsequent face to face care. Patient/ surrogate understands that there is a risk of medical inaccuracies given that our recommendations will be made based on reported data (and we must therefore assume this information is accurate).  Knowing that there is a risk that this information is not reported accurately, and that the telemedicine video, audio, or data feed may be incomplete, the patient agrees to proceed with evaluation and holds [place] harmless knowing these risks. In this evaluation, we will be providing recommendations only. The patient/ surrogate has been notified that other healthcare professionals (including students, residents and Metallurgist) may be involved in this audio-video evaluation.   All laws concerning confidentiality and patient access to medical records and copies of medical records apply to telemedicine.  The patient/ surrogate has received the [place] Notice of Privacy Practices.  I have reviewed this above verification and consent paragraph with the patient/ surrogate.  If the patient is not capacitated to understand the above, and no surrogate is available, since this is not an emergency evaluation, the visit will be rescheduled  until such time that the patient can consent, or the surrogate is available to consent.    Demographics:   Medical Record #: [id number]   Date: Sep 12, 2018   Patient Name: Debra Bolton   DOB: 09/07/1960  Age: 58 year old  Sex: female  Location: Home address on file    Evaluator(s):   Pearly Terwilliger was evaluated by me today.    Clinic Location: [facility]  [address]  [place] [address]    Chief Complaint  Chief Complaint   Patient presents with   . Recheck     follow up    1. Multiple Sclerosis, relapsing form  2. Anterior uveitis  3. IgG related lymphadenopathy, chronic sialoadenitis    History of present Illness  58 year old female with history of multiple sclerosis diagnosed 1991, presented with numbness in both legs, seizures of 4 years duration.  Multiple relapses since then.  Previously treated with Copaxone.  Started Natalizumab in 2014 with good response and improvement in pain and numbness in the extremities.  Currently taking Cymbalta and gabapentin which appear to help with pain in her extremities.    History of anterior uveitis for 1 year, treated with topical corticosteroids.  Seen by Ophthalmology at [facility].  Previous labs from [place] from April 2017 revealed normal SSA, SSB, QuantiFERON TB, cell counts, CMP, inflammatory markers.    She is a chronic heavy smoker, around 2017, developed change in voice with cervical lymphadenopathy.  Underwent removal of enlarged tonsil and cervical lymph node to rule out malignancy.  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  CT chest abdomen pelvis 08/11/2015 which was negative for lymphadenopathy and for retroperitoneal fibrosis.  No pancreatitis.  Neck ultrasound showed bilateral submandibular gland enlargement with heterogeneous echotexture, likely secondary to IgG4 related disease    Switched from natalizumab to  rituximab for multiple sclerosis, in light of history of uveitis and IgG4 related disease, by Neurology.  Received rituximab 500 mg IV 2 doses 2 weeks apart in March 2019 and September 2019    Tolerated rituximab well.  Overall felt better with respect to energy levels, submandibular pain and swelling.  Abdominal pain has resolved.      2 week history of recurrence of arthralgias, fatigue, voice hoarseness, submandibular swelling.  Symptoms are mild, gradually progressive.  Denies fever and weight loss.  Notes mild diarrhea, mid lower back pain, usually postprandial.  Denies epigastric pain    Recurrent right subacromial bursitis status post bursa injection on multiple occasions with good response.  Significant tendinopathy on most recent right shoulder MRI, notes following to the right shoulder in January 2020.  Shoulder pain worse since then, undergoing physical therapy with good benefit. Doing yoga regularly, which has helped her pain tremendously    No unexpected appetite or weight changes.  No persistent fever.      Skin: No photosensitivity, rash or ulceration. No skin psoriasis  Hair: No unusual hair loss  Oral: No oral ulceration or sore throat   Eyes:  Per HPI  Ears: No changes in hearing, ear pain or discharge  Abd: Per HPI  Resp: No difficulty breathing, chest pain or chronic cough  Genitourinary: No difficulty or pain with urination  Systemic: No fever, malaise, changes in weight or appetite. No h/o recurrent infections  Neurological:  Per HPI  Extremities: No h/o Raynaud's phenomenon. No other joints with swelling, pain or redness  Vascular:  No swelling around the ankles  Cardiac:  Denies chest pain and palpitations  Psych: No unusual mood changes    Review of Systems  As per HPI    Medications and Allergies were reviewed with the patient.       Current Outpatient Medications:   .  albuterol (VENTOLIN HFA) 108 (90 Base) MCG/ACT inhaler, Inhale 2 puffs by mouth every 6 hours as needed for Wheezing.,  Disp: 3 Inhaler, Rfl: 3  .  Amantadine HCl 100 MG tablet, Take 100 mg by mouth 2 times daily., Disp: 60 tablet, Rfl: 0  .  ARMOUR THYROID 15 MG tablet, TAKE 1 TABLET (15 MG) BY MOUTH EVERY OTHER DAY., Disp: 45 tablet, Rfl: 0  .  baclofen (LIORESAL) 10 MG tablet, TAKE 1 TABLET BY MOUTH THREE TIMES A DAY, Disp: 90 tablet, Rfl: 11  .  buPROPion (WELLBUTRIN) 75 MG tablet, Take 1 tablet (75 mg) by mouth 2 times daily., Disp: 180 tablet, Rfl: 1  .  diazepam (VALIUM) 5 MG tablet, Take 1 tablet (5 mg) by mouth daily as needed for Anxiety., Disp: 30 tablet, Rfl: 1  .  DULoxetine (CYMBALTA) 60 MG CR capsule, Take 2 capsules (120 mg) by mouth daily., Disp: 180 capsule, Rfl: 3  .  gabapentin (NEURONTIN) 300 MG capsule, Take 2 capsules (600 mg) by mouth nightly., Disp: 60 capsule, Rfl: 5  .  modafinil (PROVIGIL) 200 MG tablet, Take 1 tablet (200 mg) by mouth every morning., Disp: 30 tablet, Rfl: 0  .  nortriptyline (PAMELOR) 25 MG capsule, TAKE 2 CAPSULES BY MOUTH NIGHTLY, Disp: 180 capsule, Rfl: 2  .  prednisoLONE (ORAPRED) 15 MG/5ML solution,  Take 15 mg by mouth daily., Disp: , Rfl:   .  prednisoLONE acetate (PRED FORTE) 1 % ophthalmic suspension, Place 1 drop into both eyes 4 times daily., Disp: 1 bottle, Rfl: 3  .  thyroid (ARMOUR THYROID) 60 MG tablet, Take 1 tablet (60 mg) by mouth daily., Disp: 90 tablet, Rfl: 0  .  tiZANidine (ZANAFLEX) 4 MG tablet, Take 1.5 tablets (6 mg) by mouth at bedtime., Disp: 45 tablet, Rfl: 5    Allergies  Penicillins and Modafinil    Past Medical History:   Diagnosis Date   . Asthma 1991    with pregnancy   . Hypothyroidism    . Major depressive disorder, single episode    . MS (multiple sclerosis) (CMS-HCC)        Past Surgical History:   Procedure Laterality Date   . leep      for cervical dysplasia   . LYMPH NODE BIOPSY      IgG4 related disease   . TONSILLECTOMY AND ADENOIDECTOMY      only tonsillectomy       Family History   Problem Relation Name Age of Onset   . Other Mother Zara Chess     . Thyroid Mother Zara Chess    . Heart Disease Mother Zara Chess         heart failure   . Cancer Father don mc clain         lung   . Cancer Brother tom mcclain    . Heart Disease Brother tom mcclain         heart attack   . Cancer Sister Zara Chess         breast   . Diabetes Sister suzanne mcclain    . Thyroid Sister suzanne mcclain         car accident   . Stroke Brother [place]    . Cancer Brother Jeremy Johann         prostate cancer   . Cancer Brother john mc clain         copd lungs       Social History     Socioeconomic History   . Marital status: Divorced     Spouse name: Not on file   . Number of children: Not on file   . Years of education: Not on file   . Highest education level: Not on file   Occupational History   . Not on file   Social Needs   . Financial resource strain: Not on file   . Food insecurity:     Worry: Not on file     Inability: Not on file   . Transportation needs:     Medical: Not on file     Non-medical: Not on file   Tobacco Use   . Smoking status: Former Smoker     Packs/day: 1.00     Years: 15.00     Pack years: 15.00     Types: Cigarettes     Last attempt to quit: 01/27/2016     Years since quitting: 2.6   . Smokeless tobacco: Never Used   Substance and Sexual Activity   . Alcohol use: Yes     Alcohol/week: 2.0 standard drinks     Types: 2 Glasses of wine per week   . Drug use: Not on file   . Sexual activity: Yes     Partners: Male     Birth control/protection: Rhythm  Lifestyle   . Physical activity:     Days per week: Not on file     Minutes per session: Not on file   . Stress: Not on file   Relationships   . Social connections:     Talks on phone: Not on file     Gets together: Not on file     Attends religious service: Not on file     Active member of club or organization: Not on file     Attends meetings of clubs or organizations: Not on file     Relationship status: Not on file   . Intimate partner violence:     Fear of current or ex partner: Not on file      Emotionally abused: Not on file     Physically abused: Not on file     Forced sexual activity: Not on file   Other Topics Concern   . Military Service No   . Blood Transfusions No   . Caffeine Concern No   . Occupational Exposure No   . Hobby Hazards No   . Sleep Concern No   . Stress Concern No   . Weight Concern No   . Special Diet No   . Back Care No   . Exercise Not Asked   . Bike Helmet Not Asked   . Seat Belt Not Asked   . Self-Exams Not Asked   . Exercises Regularly Yes   . Bike Helmet Use Yes   . Seat Belt Use Yes   . Performs Self-Exams Yes   Social History Narrative    Moved from [place] is [place], high fiber, she is very conscientious of effect of diet on health.    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    Hasn't had a cigarette in 8 months.    Has smoked 26 years, less than a pack a day.    Currently drinks 2-4 drinks a day on the weekends, total 4-8 per week.    Sexually active with one female partner.    Currently not working, has been on disability for most of the time from 1992 to now.         Physical Exam  There were no vitals filed for this visit.  Gen: Alert, cooperative  Psych: Appropriate affect, pleasant  Neck: No visibly enlarged lymph nodes  Skin: No rheumatologic rashes or ulcers  Eyes: EOMI. No scleral icterus  ENT: No oral mucositis  Resp: Breathing comfortably, no tachypnea  Cardiac: Well perfused extremities, no cyanosis  Joint exam:  No visible joint swelling  Normal fist formation  Good range of motion in shoulder and pelvic girdles and remaining joints  Neuro: Oriented to time, place and person         Most recent labs and imaging were reviewed and discussed with the patient.  50 pages of outside records reviewed, outlined in HPI    08/08/2015  IgG4 level 215 upper limit 112m/dl  Total IgG 1050 upper limit 1590    01/12/17  IgG4 level 193  03/29/17:  IgG4 elevated at 205.  Normal IgG    03/2018 IgG4 163, normal IgG     Impression    ICD-10-CM ICD-9-CM   1. IgG4 related disease (CMS-HCC)  D89.89 279.8   2. Multiple sclerosis (CMS-HCC) G35 340   3. Uveitis of right eye H20.9 364.3   4. High risk medication use Z79.899 V58.69   5. Epigastric pain R10.13  789.06   6. Sialadenitis K11.20 527.2   7. Arthralgia, unspecified joint M25.50 719.40       Benelli Crill is a 58 year old female with multiple sclerosis, followed by Neurology.  Previously well controlled on treatment with natalizumab.    Switched to rituximab in March 2019 due to history of IgG4 related disease, with cervical lymphadenopathy, submandibular gland enlargement and recurrent uveitis of right eye.  Chronic nerve pain well controlled with Cymbalta and gabapentin.    IgG4 related disease on histopathology of enlarged right cervical lymph nodes.  No pancreatitis, no other systemic lymphadenopathy on CT chest abdomen pelvis performed April 2017.    History 2 episodes of anterior uveitis in 2018, no recurrence.  Negative ANA HLA B27, Qft TB    Last rituximab September 2019.  Recurrence of submandibular swelling, voice hoarseness, diarrhea in the last few weeks, likely secondary to IgG4 related disease    PLAN:  - repeat rituximab 1 g single dose  - repeat labs prior to infusion  - continue physical therapy for right rotator cuff tendinopathy    Orders Placed This Encounter   Procedures   . Infusion Center Service Request   . Sedimentation Rate (ESR), Blood Lavender   . C-Reactive Protein, Blood Green Plasma Separator Tube   . Amylase, Blood Green Plasma Separator Tube   . Lipase, Blood Green Plasma Separator Tube        Return in about 3 months (around 12/13/2018).       I spent 40 min for the entire visit including reviewing patient's chart, discussing clinical concerns, completing web-based clinical assessment, reviewing labs/imaging, making clinical decisions, discussing findings and plan with the patient and completing documenation.  Complicated case with multiple diseases, on high-risk medication, managed by multiple specialties.  All of  the patient's questions were answered to their satisfaction.

## 2018-09-12 NOTE — Telephone Encounter (Signed)
other

## 2018-09-14 ENCOUNTER — Telehealth (HOSPITAL_BASED_OUTPATIENT_CLINIC_OR_DEPARTMENT_OTHER): Payer: Self-pay

## 2018-09-14 NOTE — Telephone Encounter (Signed)
Called to reschedule RITUXAN from 11am to 10:15am. Patient's husband states that would be okay but patient will call back to confirm. They may be interested in a 1:15pm.

## 2018-09-18 ENCOUNTER — Ambulatory Visit
Admission: RE | Admit: 2018-09-18 | Discharge: 2018-09-19 | Disposition: A | Discharge status: Home / Self Care | Payer: Medicare Other | Attending: Internal Medicine | Admitting: Internal Medicine

## 2018-09-18 VITALS — BP 116/74 | HR 74 | Temp 97.9°F | Resp 16 | Ht 64.0 in | Wt 133.9 lb

## 2018-09-18 DIAGNOSIS — H209 Unspecified iridocyclitis: Secondary | ICD-10-CM | POA: Insufficient documentation

## 2018-09-18 DIAGNOSIS — G35 Multiple sclerosis: Secondary | ICD-10-CM | POA: Insufficient documentation

## 2018-09-18 DIAGNOSIS — D8989 Other specified disorders involving the immune mechanism, not elsewhere classified: Secondary | ICD-10-CM | POA: Insufficient documentation

## 2018-09-18 LAB — COMPREHENSIVE METABOLIC PANEL, BLOOD
ALT (SGPT): 22 U/L (ref 0–33)
AST (SGOT): 28 U/L (ref 0–32)
Albumin: 4.6 g/dL (ref 3.5–5.2)
Alkaline Phos: 54 U/L (ref 35–140)
Anion Gap: 15 mmol/L (ref 7–15)
BUN: 17 mg/dL (ref 6–20)
Bicarbonate: 26 mmol/L (ref 22–29)
Bilirubin, Tot: 0.47 mg/dL (ref <1.2)
Calcium: 10 mg/dL (ref 8.5–10.6)
Chloride: 97 mmol/L — ABNORMAL LOW (ref 98–107)
Creatinine: 0.91 mg/dL (ref 0.51–0.95)
GFR: >60 mL/min
Glucose: 75 mg/dL (ref 70–99)
Potassium: 4 mmol/L (ref 3.5–5.1)
Sodium: 138 mmol/L (ref 136–145)
Total Protein: 7.6 g/dL (ref 6.0–8.0)

## 2018-09-18 LAB — CBC WITH DIFF, BLOOD
ANC-Automated: 2.3 10*3/uL (ref 1.6–7.0)
ANC-Instrument: 2.3 10*3/uL (ref 1.6–7.0)
Abs Basophils: 0 10*3/uL (ref <0.1)
Abs Eosinophils: 0.3 10*3/uL (ref 0.1–0.5)
Abs Lymphs: 2.7 10*3/uL (ref 0.8–3.1)
Abs Monos: 0.7 10*3/uL (ref 0.2–0.8)
Basophils: 0 %
Eosinophils: 4 %
Hct: 39.2 % (ref 34.0–45.0)
Hgb: 12.8 gm/dL (ref 11.2–15.7)
Lymphocytes: 46 %
MCH: 30.8 pg (ref 26.0–32.0)
MCHC: 32.7 g/dL (ref 32.0–36.0)
MCV: 94.2 um3 (ref 79.0–95.0)
MPV: 9.5 fL (ref 9.4–12.4)
Monocytes: 12 %
Plt Count: 257 10*3/uL (ref 140–370)
RBC: 4.16 10*6/uL (ref 3.90–5.20)
RDW: 11.5 % — ABNORMAL LOW (ref 12.0–14.0)
Segs: 38 %
WBC: 6 10*3/uL (ref 4.0–10.0)

## 2018-09-18 LAB — C-REACTIVE PROTEIN, BLOOD: CRP: 0.05 mg/dL (ref <0.5)

## 2018-09-18 LAB — SED RATE, BLOOD: Sed Rate: 7 mm/hr (ref 0–30)

## 2018-09-18 LAB — LIPASE, BLOOD: Lipase: 24 U/L (ref 13–60)

## 2018-09-18 LAB — AMYLASE, BLOOD: Amylase: 63 U/L (ref 28–100)

## 2018-09-18 MED ORDER — SODIUM CHLORIDE 0.9 % IV SOLN
INTRAVENOUS | Status: AC
Start: 2018-09-18 — End: 2018-09-18
  Administered 2018-09-18: 10:00:00 via INTRAVENOUS

## 2018-09-18 MED ORDER — DIPHENHYDRAMINE HCL 50 MG/ML IJ SOLN
25.0000 mg | Freq: Once | INTRAMUSCULAR | Status: AC
Start: 2018-09-18 — End: 2018-09-18
  Administered 2018-09-18: 10:00:00 25 mg via INTRAVENOUS
  Filled 2018-09-18: qty 1

## 2018-09-18 MED ORDER — ACETAMINOPHEN 325 MG PO TABS
650.0000 mg | ORAL_TABLET | Freq: Once | ORAL | Status: AC
Start: 2018-09-18 — End: 2018-09-18
  Administered 2018-09-18: 650 mg via ORAL
  Filled 2018-09-18: qty 2

## 2018-09-18 MED ORDER — SODIUM CHLORIDE 0.9 % IV SOLN
1000.0000 mg | Freq: Once | INTRAVENOUS | Status: AC
Start: 2018-09-18 — End: 2018-09-18
  Administered 2018-09-18: 11:00:00 1000 mg via INTRAVENOUS
  Filled 2018-09-18: qty 100

## 2018-09-18 MED ORDER — METHYLPREDNISOLONE SODIUM SUCC 125 MG IJ SOLR CUSTOM
100.0000 mg | Freq: Once | INTRAMUSCULAR | Status: AC
Start: 2018-09-18 — End: 2018-09-18
  Administered 2018-09-18 (×2): 100 mg via INTRAVENOUS
  Filled 2018-09-18: qty 125

## 2018-09-18 NOTE — Interdisciplinary (Signed)
Non-Chemotherapy Infusion Nursing Note - Debra Bolton is a 58 year old female who presents for infusion of Rituximab 1000 mg.    There were no vitals filed for this visit.  Pain Score:    There is no height or weight on file to calculate BSA.  There is no height or weight on file to calculate BMI.    Pre-treatment nursing assessment:  No problems identified upon assessment.  Pt alert and oriented. Denies pain at this time. Denies n/v.  Denies fever/chills/sob.     24 gauge peripheral IV inserted to patient's right AC.  Positive blood return noted and labs drawn peripherally.  Flushed with NS and NS infusing.    Medications   sodium chloride 0.9% infusion ( IntraVENOUS Stopped 09/18/18 1435)   acetaminophen (TYLENOL) tablet 650 mg (650 mg Oral Given 09/18/18 1024)   diphenhydrAMINE (BENADRYL) injection 25 mg (25 mg IntraVENOUS Given 09/18/18 1025)   methylPREDNISolone sodium succinate (SOLU-MEDROL) injection 100 mg (100 mg IntraVENOUS Given 09/18/18 1025)   riTUXimab (RITUXAN) 1,000 mg in sodium chloride 0.9 % 1,000 mL infusion (0 mg IntraVENOUS Completed 09/18/18 1430)     [name] tolerated treatment well.    Post blood return: Brisk  Post-Flush: NS and Discontinued IV    Patient Education  Learner: Patient  Barriers to learning: No Barriers  Readiness to learn: Acceptance  Method: Explanation    Treatment Education: Information/teaching given to patient including: signs and symptoms of infection, bleeding, adverse reaction(s), symptom control, and when to notify MD.    [name]: Instructed patient to call for assistance.    Pain Education: Patient instructed to contact nurse if pain should develop or if their current pain therapy becomes ineffective.    Response: Verbalizes understanding    Discharge Plan  Discharge instructions given to patient.  Future appointments given and reviewed with treatment plan.  Discharge Mode: Ambulatory  Discharge Time: 1440  Accompanied  by: Self  Discharged To: Home

## 2018-09-20 LAB — IGG SUBCLASS PANEL, BLOOD
IGG Subclass 1: 511 mg/dL (ref 240–1118)
IGG Subclass 2: 383 mg/dL (ref 124–549)
IGG Subclass 3: 16 mg/dL — ABNORMAL LOW (ref 21–134)
IGG Subclass 4: 139 mg/dL — ABNORMAL HIGH (ref 1–123)

## 2018-09-21 DIAGNOSIS — F1120 Opioid dependence, uncomplicated: Secondary | ICD-10-CM | POA: Diagnosis not present

## 2018-09-21 DIAGNOSIS — Z720 Tobacco use: Secondary | ICD-10-CM | POA: Diagnosis not present

## 2018-09-21 DIAGNOSIS — M546 Pain in thoracic spine: Secondary | ICD-10-CM | POA: Diagnosis not present

## 2018-09-21 DIAGNOSIS — G5600 Carpal tunnel syndrome, unspecified upper limb: Secondary | ICD-10-CM | POA: Diagnosis not present

## 2018-09-21 DIAGNOSIS — Z79891 Long term (current) use of opiate analgesic: Secondary | ICD-10-CM | POA: Diagnosis not present

## 2018-09-21 DIAGNOSIS — I2510 Atherosclerotic heart disease of native coronary artery without angina pectoris: Secondary | ICD-10-CM | POA: Diagnosis not present

## 2018-09-21 DIAGNOSIS — M5090 Cervical disc disorder, unspecified, unspecified cervical region: Secondary | ICD-10-CM | POA: Diagnosis not present

## 2018-09-21 DIAGNOSIS — G894 Chronic pain syndrome: Secondary | ICD-10-CM | POA: Diagnosis not present

## 2018-09-21 DIAGNOSIS — Z8781 Personal history of (healed) traumatic fracture: Secondary | ICD-10-CM | POA: Diagnosis not present

## 2018-10-03 DIAGNOSIS — I6523 Occlusion and stenosis of bilateral carotid arteries: Secondary | ICD-10-CM | POA: Diagnosis not present

## 2018-10-03 DIAGNOSIS — F17210 Nicotine dependence, cigarettes, uncomplicated: Secondary | ICD-10-CM | POA: Diagnosis not present

## 2018-10-03 DIAGNOSIS — I2510 Atherosclerotic heart disease of native coronary artery without angina pectoris: Secondary | ICD-10-CM | POA: Diagnosis not present

## 2018-10-03 DIAGNOSIS — E785 Hyperlipidemia, unspecified: Secondary | ICD-10-CM | POA: Diagnosis not present

## 2018-10-03 DIAGNOSIS — Z951 Presence of aortocoronary bypass graft: Secondary | ICD-10-CM | POA: Diagnosis not present

## 2018-10-03 DIAGNOSIS — I10 Essential (primary) hypertension: Secondary | ICD-10-CM | POA: Diagnosis not present

## 2018-10-03 DIAGNOSIS — F17200 Nicotine dependence, unspecified, uncomplicated: Secondary | ICD-10-CM | POA: Insufficient documentation

## 2018-10-06 ENCOUNTER — Ambulatory Visit (INDEPENDENT_AMBULATORY_CARE_PROVIDER_SITE_OTHER): Payer: Self-pay | Admitting: Family Practice

## 2018-10-06 ENCOUNTER — Encounter (HOSPITAL_BASED_OUTPATIENT_CLINIC_OR_DEPARTMENT_OTHER): Payer: Self-pay | Admitting: Neurology

## 2018-10-06 ENCOUNTER — Other Ambulatory Visit (INDEPENDENT_AMBULATORY_CARE_PROVIDER_SITE_OTHER): Payer: Self-pay | Admitting: Family Practice

## 2018-10-06 DIAGNOSIS — E039 Hypothyroidism, unspecified: Secondary | ICD-10-CM

## 2018-10-06 NOTE — Telephone Encounter (Signed)
From: Debra Bolton  To: Leana Gamer, MD  Sent: 10/06/2018 1:26 PM PDT  Subject: 1-Non Urgent Medical Advice    Dear Dr. Reece Levy,    I already sent a message to my neurologist, Dr. Rosendo Gros, about this issue.    I have had severe vertigo since last night. I fell over in the hallway from not knowing what was up or down. It seems to relax a little when I am in bed but any movement makes it worse. I know that it can be a symptom of MS, so I am not quite sure what to do. Do I need to be seen or just rest? It's actually quite scary, but I don't want to over react.  Thank you fro helping me with this.  Debra Bolton

## 2018-10-06 NOTE — Telephone Encounter (Signed)
PROVIDER ACTION REQUESTED: NO, FYI Only    Pt sent a mychart message.  Follow up call done.  Pt c/o severe dizziness for 12 hours.  Pt woke up last night/early morning to go to the bathroom and started feeling dizzy.  It was so debilitating that she had to sit and slide down to the floor.  She denies passing out or hitting her head.  She have had vertigo's in the past but not this severe.  Pt assessed herself and denies tongue deviation, dysphagia, one sided weakness.    Pt was advised to proceed to the ER.  Her boyfriend will bring her to the closest ER where they live since [place] is a hour drive from them.    CLINIC/RN/LVN ACTION REQUEST: NO, FYI Only    NT RN ACTION: Patient advised to be evaluated in ED, and have another person drive them to hospital.  RN placed ED referral in Epic.   COVID TRIAGE PROTOCOL: NEGATIVE     Reason for Call: Dizziness     Disposition: Go to ED Now       Appt scheduled: No    Reason for Disposition  . SEVERE dizziness (e.g., unable to stand, requires support to walk, feels like passing out now)    Additional Information  . Negative: Severe difficulty breathing (e.g., struggling for each breath, speaks in single words)  . Negative: [1] Difficulty breathing or swallowing AND [2] started suddenly after medicine, an allergic food or bee sting  . Negative: Shock suspected (e.g., cold/pale/clammy skin, too weak to stand, low BP, rapid pulse)  . Negative: Difficult to awaken or acting confused (e.g., disoriented, slurred speech)  . Negative: [1] Weakness (i.e., paralysis, loss of muscle strength) of the face, arm or leg on one side of the body AND [2] sudden onset AND [3] present now  . Negative: [1] Numbness (i.e., loss of sensation) of the face, arm or leg on one side of the body AND [2] sudden onset AND [3] present now  . Negative: [1] Loss of speech or garbled speech AND [2] sudden onset AND [3] present now  . Negative: Overdose (accidental or intentional) of medications  . Negative: [1]  Fainted > 15 minutes ago AND [2] still feels too weak or dizzy to stand  . Negative: Heart beating < 50 beats per minute OR > 140 beats per minute  . Negative: Sounds like a life-threatening emergency to the triager  . Negative: Chest pain  . Negative: Rectal bleeding, bloody stool, or tarry-black stool  . Negative: [1] Vomiting AND [2] contains red blood or black ("coffee ground") material  . Negative: Vomiting is main symptom  . Negative: Diarrhea is main symptom  . Negative: Headache is main symptom  . Negative: Patient states that he/she is having an anxiety/panic attack  . Negative: Dizziness from low blood sugar (i.e., < 60 mg/dl or 3.5 mmol/l)  . Negative: Dizziness is described as a spinning sensation (i.e., vertigo)  . Negative: Heat exhaustion suspected (i.e., dehydration from heat exposure)  . Negative: Difficulty breathing    Answer Assessment - Initial Assessment Questions  1. DESCRIPTION: "Describe your dizziness."      Dizziness     2. LIGHTHEADED: "Do you feel lightheaded?" (e.g., somewhat faint, woozy, weak upon standing)      Vertigo     3. VERTIGO: "Do you feel like either you or the room is spinning or tilting?" (i.e. vertigo)      Cant move and sled to  the floor     4. SEVERITY: "How bad is it?"  "Do you feel like you are going to faint?" "Can you stand and walk?"    - MILD - walking normally    - MODERATE - interferes with normal activities (e.g., work, school)     - SEVERE - unable to stand, requires support to walk, feels like passing out now.       Severe     5. ONSET:  "When did the dizziness begin?"      On going     6. AGGRAVATING FACTORS: "Does anything make it worse?" (e.g., standing, change in head position)      Going to the bathroom     7. HEART RATE: "Can you tell me your heart rate?" "How many beats in 15 seconds?"  (Note: not all patients can do this)        No     8. CAUSE: "What do you think is causing the dizziness?"      Not sure if its due to MS     9. RECURRENT SYMPTOM:  "Have you had dizziness before?" If so, ask: "When was the last time?" "What happened that time?"      Yes, but not this severe    10. OTHER SYMPTOMS: "Do you have any other symptoms?" (e.g., fever, chest pain, vomiting, diarrhea, bleeding)        Nausea     11. PREGNANCY: "Is there any chance you are pregnant?" "When was your last menstrual period?"        No    Protocols used: DIZZINESS [facility]

## 2018-10-07 ENCOUNTER — Encounter (INDEPENDENT_AMBULATORY_CARE_PROVIDER_SITE_OTHER): Payer: Self-pay | Admitting: Family Practice

## 2018-10-07 ENCOUNTER — Encounter (HOSPITAL_BASED_OUTPATIENT_CLINIC_OR_DEPARTMENT_OTHER): Payer: Self-pay | Admitting: Neurology

## 2018-10-07 NOTE — Telephone Encounter (Signed)
Caryl Bis, CPhT  (Rx Refill and [facility])      Hypothyroid Medication Refill Protocol    Last visit: 06/12/2018  Follow-up results    Next f/u appt due:  6 weeks (around 07/24/2018) for Right Shoulder.   Next scheduled appointment: Visit date not found        **If no recent dose changes, and last TSH (and T4 for Liothyronine only) normal/within date, no recent notes will be reviewed per Pharmacy Dept protocol**      LABS required:  (Q year TSH)  *If recent dose change: check TSH 2 months after starting new  dose.    Lab Results   Component Value Date    TSH 0.47 06/13/2018    FREET4 0.86 (L) 08/10/2017         Monitoring required:  (Q year BP, HR, Wt)   *If pt is pregnant, DEFER TO MD*    (BP range: ZMCEYEMV=36-122  ESLPNPYYF=11-02)  Blood Pressure   09/18/18 116/74   06/13/18 123/79   06/12/18 104/67       (HR range: 55-110)   Pulse Readings from Last 3 Encounters:   09/18/18 74   06/13/18 88   06/12/18 83       Wt Readings from Last 3 Encounters:   09/18/18 60.7 kg (133 lb 14.4 oz)   06/12/18 59.9 kg (132 lb 0.9 oz)   05/26/18 60.9 kg (134 lb 4.2 oz)     Hypothyroid Medication Refill Protocol    **If no recent dose changes, and last TSH (and T4 for Liothyronine only) normal/within date, no recent notes will be reviewed per Pharmacy Dept protocol**      LABS required:  (Q year TSH)  *If recent dose change: check TSH 2 months after starting new  dose.    Lab Results   Component Value Date    TSH 0.47 06/13/2018    FREET4 0.86 (L) 08/10/2017         Monitoring required:  (Q year BP, HR, Wt)   *If pt is pregnant, DEFER TO MD*    (BP range: TRZNBVAP=01-410  VUDTHYHOO=87-57)  Blood Pressure   09/18/18 116/74   06/13/18 123/79   06/12/18 104/67       (HR range: 55-110)   Pulse Readings from Last 3 Encounters:   09/18/18 74   06/13/18 88   06/12/18 83       Wt Readings from Last 3 Encounters:   09/18/18 60.7 kg (133 lb 14.4 oz)   06/12/18 59.9 kg (132 lb 0.9 oz)   05/26/18 60.9 kg (134 lb 4.2 oz)

## 2018-10-08 ENCOUNTER — Other Ambulatory Visit (INDEPENDENT_AMBULATORY_CARE_PROVIDER_SITE_OTHER): Payer: Self-pay | Admitting: Family Practice

## 2018-10-08 DIAGNOSIS — E039 Hypothyroidism, unspecified: Secondary | ICD-10-CM

## 2018-10-08 MED ORDER — THYROID 60 MG OR TABS
60.0000 mg | ORAL_TABLET | Freq: Every day | ORAL | 2 refills | Status: DC
Start: 2018-10-08 — End: 2019-05-09

## 2018-10-08 MED ORDER — THYROID 15 MG OR TABS
15.0000 mg | ORAL_TABLET | ORAL | 2 refills | Status: DC
Start: 2018-10-08 — End: 2019-05-09

## 2018-10-11 NOTE — Telephone Encounter (Signed)
Provided patient with an appointment for 06/29@1040  with Dr. Rana Snare. No further action required will close encounter.

## 2018-10-13 ENCOUNTER — Other Ambulatory Visit: Payer: Self-pay

## 2018-10-16 ENCOUNTER — Encounter (INDEPENDENT_AMBULATORY_CARE_PROVIDER_SITE_OTHER): Payer: Self-pay | Admitting: Family Practice

## 2018-10-16 ENCOUNTER — Ambulatory Visit (INDEPENDENT_AMBULATORY_CARE_PROVIDER_SITE_OTHER): Payer: Medicare Other | Admitting: Family Practice

## 2018-10-16 VITALS — BP 117/82 | HR 68 | Temp 98.5°F | Resp 16 | Ht 64.0 in | Wt 133.6 lb

## 2018-10-16 DIAGNOSIS — G5600 Carpal tunnel syndrome, unspecified upper limb: Secondary | ICD-10-CM | POA: Diagnosis not present

## 2018-10-16 DIAGNOSIS — M5090 Cervical disc disorder, unspecified, unspecified cervical region: Secondary | ICD-10-CM | POA: Diagnosis not present

## 2018-10-16 DIAGNOSIS — Z79891 Long term (current) use of opiate analgesic: Secondary | ICD-10-CM | POA: Diagnosis not present

## 2018-10-16 DIAGNOSIS — Z8781 Personal history of (healed) traumatic fracture: Secondary | ICD-10-CM | POA: Diagnosis not present

## 2018-10-16 DIAGNOSIS — G894 Chronic pain syndrome: Secondary | ICD-10-CM | POA: Diagnosis not present

## 2018-10-16 DIAGNOSIS — M546 Pain in thoracic spine: Secondary | ICD-10-CM | POA: Diagnosis not present

## 2018-10-16 DIAGNOSIS — F1120 Opioid dependence, uncomplicated: Secondary | ICD-10-CM | POA: Diagnosis not present

## 2018-10-16 DIAGNOSIS — I2510 Atherosclerotic heart disease of native coronary artery without angina pectoris: Secondary | ICD-10-CM | POA: Diagnosis not present

## 2018-10-16 DIAGNOSIS — Z720 Tobacco use: Secondary | ICD-10-CM | POA: Diagnosis not present

## 2018-10-16 DIAGNOSIS — Z1389 Encounter for screening for other disorder: Secondary | ICD-10-CM

## 2018-10-16 DIAGNOSIS — Z789 Other specified health status: Secondary | ICD-10-CM

## 2018-10-16 DIAGNOSIS — Z1339 Encounter for screening examination for other mental health and behavioral disorders: Secondary | ICD-10-CM

## 2018-10-16 DIAGNOSIS — G35 Multiple sclerosis: Secondary | ICD-10-CM

## 2018-10-16 DIAGNOSIS — R42 Dizziness and giddiness: Secondary | ICD-10-CM

## 2018-10-16 MED ORDER — MECLIZINE HCL 25 MG OR TABS
25.0000 mg | ORAL_TABLET | Freq: Three times a day (TID) | ORAL | 0 refills | Status: DC | PRN
Start: 2018-10-16 — End: 2018-11-10

## 2018-10-16 NOTE — Telephone Encounter (Signed)
From: Debra Bolton  To: Revere Kristen Loader, MD  Sent: 10/06/2018 1:19 PM PDT  Subject: 1-Non Urgent Medical Advice    Dear Dr. Rosendo Gros,   I have had severe vertigo, since last night around midnight. I fell in the hallway due to the extreme confusion of which way was up or down. So my question, is this the MS? Do I need to be seen? Is this an emergency or do I just need to rest and treat this as an MS symptom rearing its ugly head. I just don't want to over or under react.    Thanks for letting me know,  Debra Bolton

## 2018-10-16 NOTE — Progress Notes (Signed)
SUBJECTIVE:  Debra Bolton is a 58 year old female here for vertigo    She has history of MS diagnosed in 1991, seeing neurology, currently on rituximab treatments and amantadine    She states she started to develop vertigo, went to urgent care on 10/06/18, states she woke up in the middle of night and fell in the hallway due to vertigo felt like everything was spinning and she could not do anything; at urgent care told she had extreme vertigo, felt nauseous with vertigo and started scopolamine patches, seems to help symptoms  She states she sometimes gets headaches, pressure on the side of her head, sometimes gets stabbing headache in the back of her head    She denies having photophobia with headache  Right now not having any symptoms of vertigo during visit  When she moves she feels the vertigo, moving her head  She tried to do Epley maneuvers at home, did not help  No fevers    No ringing in her ears  No decreased hearing  No recent URI symptoms prior, she has been home since the middle of March    She has not been doing yoga since she has been getting dizzy, yoga was helping with her mood, moving her head in different positions makes her more dizzy; was doing yoga with her daughters remotely via video; one daughter lives in [place] and the other lives in [place]  She is feeling discouraged, was starting to develop routine but dizziness has disrupted her routine    States in past has experience motion sickness while in car, states the vertigo she feels is different    No chest pain  No syncope  No SOB    Overall states her symptoms have improved over the last 10 days but is not gone    She is concerned that the vertigo could be worsening MS, has reached out to her neurologist            Review of Systems -   See HPI  HISTORY:  Patient Active Problem List   Diagnosis   . Multiple sclerosis (CMS-HCC)   . IgG4 related disease (CMS-HCC)   . Uveitis of right eye   . Posterior subcapsular age-related cataract,  right eye   . Mood disorder (CMS-HCC)   . PTSD (post-traumatic stress disorder)   . Adequate exercise, at or above goal     Past Medical History:   Diagnosis Date   . Asthma 1991    with pregnancy   . Hypothyroidism    . Major depressive disorder, single episode    . MS (multiple sclerosis) (CMS-HCC)      Past Surgical History:   Procedure Laterality Date   . leep      for cervical dysplasia   . LYMPH NODE BIOPSY      IgG4 related disease   . TONSILLECTOMY AND ADENOIDECTOMY      only tonsillectomy       Current Outpatient Medications on File Prior to Visit   Medication Sig Dispense Refill   . albuterol (VENTOLIN HFA) 108 (90 Base) MCG/ACT inhaler Inhale 2 puffs by mouth every 6 hours as needed for Wheezing. 3 Inhaler 3   . Amantadine HCl 100 MG tablet Take 100 mg by mouth 2 times daily. 60 tablet 0   . baclofen (LIORESAL) 10 MG tablet TAKE 1 TABLET BY MOUTH THREE TIMES A DAY 90 tablet 11   . buPROPion (WELLBUTRIN) 75 MG tablet Take 1  tablet (75 mg) by mouth 2 times daily. 180 tablet 1   . diazepam (VALIUM) 5 MG tablet Take 1 tablet (5 mg) by mouth daily as needed for Anxiety. 30 tablet 1   . DULoxetine (CYMBALTA) 60 MG CR capsule Take 2 capsules (120 mg) by mouth daily. 180 capsule 3   . gabapentin (NEURONTIN) 300 MG capsule Take 2 capsules (600 mg) by mouth nightly. 60 capsule 5   . modafinil (PROVIGIL) 200 MG tablet Take 1 tablet (200 mg) by mouth every morning. 30 tablet 0   . nortriptyline (PAMELOR) 25 MG capsule TAKE 2 CAPSULES BY MOUTH NIGHTLY 180 capsule 2   . prednisoLONE (ORAPRED) 15 MG/5ML solution Take 15 mg by mouth daily.     . prednisoLONE acetate (PRED FORTE) 1 % ophthalmic suspension Place 1 drop into both eyes 4 times daily. 1 bottle 3   . thyroid (ARMOUR THYROID) 15 MG tablet Take 1 tablet (15 mg) by mouth every other day. 45 tablet 2   . thyroid (ARMOUR THYROID) 60 MG tablet Take 1 tablet (60 mg) by mouth daily. 90 tablet 2   . tiZANidine (ZANAFLEX) 4 MG tablet Take 1.5 tablets (6 mg) by mouth at  bedtime. 45 tablet 5     No current facility-administered medications on file prior to visit.      Allergies   Allergen Reactions   . Penicillins Rash   . Modafinil Diarrhea and Nausea and Vomiting     Family History   Problem Relation Name Age of Onset   . Other Mother Zara Chess    . Thyroid Mother Zara Chess    . Heart Disease Mother Zara Chess         heart failure   . Cancer Father don mc clain         lung   . Cancer Brother tom mcclain    . Heart Disease Brother tom mcclain         heart attack   . Cancer Sister Zara Chess         breast   . Diabetes Sister suzanne mcclain    . Thyroid Sister suzanne mcclain         car accident   . Stroke Brother [place]    . Cancer Brother Jeremy Johann         prostate cancer   . Cancer Brother john mc clain         copd lungs     Social History     Socioeconomic History   . Marital status: Divorced     Spouse name: Not on file   . Number of children: Not on file   . Years of education: Not on file   . Highest education level: Not on file   Occupational History   . Not on file   Social Needs   . Financial resource strain: Not on file   . Food insecurity:     Worry: Not on file     Inability: Not on file   . Transportation needs:     Medical: Not on file     Non-medical: Not on file   Tobacco Use   . Smoking status: Former Smoker     Packs/day: 1.00     Years: 15.00     Pack years: 15.00     Types: Cigarettes     Last attempt to quit: 01/27/2016     Years since quitting: 2.7   . Smokeless  tobacco: Never Used   Substance and Sexual Activity   . Alcohol use: Yes     Alcohol/week: 2.0 standard drinks     Types: 2 Glasses of wine per week   . Drug use: Not on file   . Sexual activity: Yes     Partners: Male     Birth control/protection: Rhythm   Lifestyle   . Physical activity:     Days per week: Not on file     Minutes per session: Not on file   . Stress: Not on file   Relationships   . Social connections:     Talks on phone: Not on file     Gets together: Not on  file     Attends religious service: Not on file     Active member of club or organization: Not on file     Attends meetings of clubs or organizations: Not on file     Relationship status: Not on file   . Intimate partner violence:     Fear of current or ex partner: Not on file     Emotionally abused: Not on file     Physically abused: Not on file     Forced sexual activity: Not on file   Other Topics Concern   . Military Service No   . Blood Transfusions No   . Caffeine Concern No   . Occupational Exposure No   . Hobby Hazards No   . Sleep Concern No   . Stress Concern No   . Weight Concern No   . Special Diet No   . Back Care No   . Exercise Not Asked   . Bike Helmet Not Asked   . Seat Belt Not Asked   . Self-Exams Not Asked   . Exercises Regularly Yes   . Bike Helmet Use Yes   . Seat Belt Use Yes   . Performs Self-Exams Yes   Social History Narrative    Moved from [place] is great, high fiber, she is very conscientious of effect of diet on health.    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    Hasn't had a cigarette in 8 months.    Has smoked 26 years, less than a pack a day.    Currently drinks 2-4 drinks a day on the weekends, total 4-8 per week.    Sexually active with one female partner.    Currently not working, has been on disability for most of the time from 1992 to now.       OBJECTIVE:    BP 117/82 (BP Location: Left arm, BP Patient Position: Sitting, BP cuff size: Regular)   Pulse 68   Temp 98.5 F (36.9 C) (Oral)   Resp 16   Ht 5\' 4"  (1.626 m)   Wt 60.6 kg (133 lb 9.6 oz)   SpO2 97%   BMI 22.93 kg/m     Body mass index is 22.93 kg/m.  Wt Readings from Last 5 Encounters:   10/16/18 60.6 kg (133 lb 9.6 oz)   09/18/18 60.7 kg (133 lb 14.4 oz)   06/12/18 59.9 kg (132 lb 0.9 oz)   05/26/18 60.9 kg (134 lb 4.2 oz)   05/26/18 61.2 kg (134 lb 14.7 oz)     Blood Pressure   10/16/18 117/82   09/18/18 116/74   06/13/18 123/79   06/12/18 104/67   05/26/18 127/90       General: WDWN, NAD,  answers  appropriately  Eyes: anicteric, PERRL, EOMI, horizontal nystagmus noted  Heart: RRR, no MRG  Lungs: normal effort, CTAB, no wheezes, rhonchi, crackles  Neuro:finger to nose normal, no pronator drift noted some tremulousness of hands outstretched, 5/5 strength bilateral upper and lower extremities, romberg negative  Psych; alert and oriented x e, appropriate affect      Studies:  08/11/2017 4:28 PM - Electronic Interface To Epic, Radiant Results     Narrative & Impression     EXAM DESCRIPTION:  MRI BRAIN WO/W CONTRAST on 08/10/2017    CLINICAL HISTORY:  Multiple sclerosis. Clinically stable.    TECHNIQUE:  MRI imaging of the brain was performed on a 1.5 Tesla GE magnet system with sequences including: Axial diffusion, T2, FLAIR, SWAN with minimum intensity reproductions; coronal T2. Sagittal FSPGR, CUBE FLAIR with axial and coronal reformats.    There was administration of 7 mL Gadavist intravenous contrast    COMPARISON:  MRI brain 07/16/2016, 06/21/2015    FINDINGS:  Redemonstration of multiple foci of subcentimeter T2/FLAIR white matter hyperintensities involving the periventricular and juxtacortical white matter of the supratentorial. Additional similar subcentimeter foci are seen in the bilateral paracentral and right dorsolateral aspect of the pons.    No new lesions. No restricted diffusion or enhancing lesions.    No acute or subacute infarct. No MR evidence of subacute or chronic intracranial hemorrhage.    The flow voids corresponding to the major skullbase arteries and dural venous sinuses are preserved. The visible paranasal sinuses are well aerated. The mastoid air cells are clear.    No focal lesions of the calvarium, skullbase or regional soft tissues.    CONCURRENT SUPERVISION:  I have reviewed the images and agree with the [place] interpretation.       I have reviewed the images and agree with the resident interpretation.           Preliminary created by: Andre Lefort   Signed by: Rogelio Seen 08/11/2017 16:25:56  IMPRESSION:  IMPRESSION:  1. Compared to MRI brain 07/16/2016, no significant change of multifocal subcentimeter T2/FLAIR white matter hyperintensities scattered throughout the supratentorial white matter, and to a lesser extent the pontine white matter, compatible with given history of multiple sclerosis. No new lesions. No restricted diffusion or enhancing lesions to suggest active demyelination.    2. No acute intracranial findings.         ASSESMENT AND PLAN:       ICD-10-CM ICD-9-CM    1. Vertigo R42 780.4 MRI Brain WO/W Contrast   Likely has BPPV, discussed vestibular therapy may be helpful with vertigo, can trial meclizine if helps with vertigo, will also get MRI of brain given her history of MS; follow up if no improvement   Basic Metabolic Panel, Blood Green Plasma Separator Tube      [facility] Clinic      meclizine (ANTIVERT) 25 MG tablet   2. Increased physical activity Z78.9 V49.89    3. Screened negative for significant alcohol use Z13.39 V79.1    4. Screened negative for drug use Z13.89 V82.9    5. Multiple sclerosis (CMS-HCC) G35 340 MRI Brain WO/W Contrast      Basic Metabolic Panel, Blood Green Plasma Separator Tube       F/U: Return if symptoms worsen or fail to improve.     Medication Management:  Medications reviewed with patient and medication list reconciled.  Over the counter medications, herbal therapies and supplements reviewed.  Patient's understanding and response  to medications assessed.    Barriers to medications assessed and addressed.  Risks, benefits, alternatives to medications reviewed.    Randa Ngo, MD  Department of Family Medicine and [place], [place] [facility]  HS Assistant Clinical Professor, [employer]

## 2018-10-16 NOTE — Patient Instructions (Signed)
Please get the MRI, I ordered a blood test you have to get a few days before the MRI  Please seen ENT as well  Try the meclizine as needed when you feel the vertigo  If you feel worse/have the worst episode of vertigo please go to the ER

## 2018-10-16 NOTE — Interdisciplinary (Signed)
Pre-visit chart review and huddle completed with staff and physician.    Outstanding labs, imaging and consults reviewed and identified.    Health maintanence issues identified and addressed:    Health Maintenance   Topic Date Due   . Shingles Vaccine (1 of 2) 12/16/2008   . Medicare Annual Wellness Visit  03/15/2016   . PHQ9 Depression Monitoring doc flowsheet  02/13/2018   . BREAST CANCER SCREENING  11/19/2018   . INFLUENZA VACCINE  11/18/2018   . Colorectal Cancer Screening (Combined)  01/16/2022   . CERVICAL CANCER SCREENING  01/19/2022   . Tetanus (2 - Td) 01/20/2027   . Hepatitis C Screening  Completed   . Polio Vaccine  Aged Out   . HPV Vaccine <= 26 Yrs  Aged Out   . Meningococcal MCV4 Vaccine  Aged Out

## 2018-10-16 NOTE — Telephone Encounter (Signed)
Responded in separate encounter; patient seen at [facility] and in family medicine for vertigo.

## 2018-10-19 ENCOUNTER — Other Ambulatory Visit (HOSPITAL_BASED_OUTPATIENT_CLINIC_OR_DEPARTMENT_OTHER): Payer: Medicare Other

## 2018-10-19 ENCOUNTER — Ambulatory Visit
Admission: RE | Admit: 2018-10-19 | Discharge: 2018-10-19 | Disposition: A | Discharge status: Home / Self Care | Payer: Medicare Other | Attending: Family Practice | Admitting: Family Practice

## 2018-10-19 DIAGNOSIS — R42 Dizziness and giddiness: Secondary | ICD-10-CM

## 2018-10-19 DIAGNOSIS — G35 Multiple sclerosis: Secondary | ICD-10-CM | POA: Insufficient documentation

## 2018-10-19 DIAGNOSIS — D8989 Other specified disorders involving the immune mechanism, not elsewhere classified: Secondary | ICD-10-CM

## 2018-10-19 LAB — CBC WITH DIFF, BLOOD
ANC-Automated: 2.3 10*3/uL (ref 1.6–7.0)
Abs Basophils: 0 10*3/uL (ref <0.1)
Abs Eosinophils: 0.3 10*3/uL (ref 0.1–0.5)
Abs Lymphs: 3.1 10*3/uL (ref 0.8–3.1)
Abs Monos: 0.8 10*3/uL (ref 0.2–0.8)
Basophils: 1 %
Eosinophils: 4 %
Hct: 40.5 % (ref 34.0–45.0)
Hgb: 13 gm/dL (ref 11.2–15.7)
Lymphocytes: 48 %
MCH: 29.7 pg (ref 26.0–32.0)
MCHC: 32.1 g/dL (ref 32.0–36.0)
MCV: 92.7 um3 (ref 79.0–95.0)
MPV: 9.9 fL (ref 9.4–12.4)
Monocytes: 12 %
Plt Count: 287 10*3/uL (ref 140–370)
RBC: 4.37 10*6/uL (ref 3.90–5.20)
RDW: 11.7 % — ABNORMAL LOW (ref 12.0–14.0)
Segs: 36 %
WBC: 6.6 10*3/uL (ref 4.0–10.0)

## 2018-10-19 LAB — COMPREHENSIVE METABOLIC PANEL, BLOOD
ALT (SGPT): 19 U/L (ref 0–33)
AST (SGOT): 24 U/L (ref 0–32)
Albumin: 4.5 g/dL (ref 3.5–5.2)
Alkaline Phos: 52 U/L (ref 35–140)
Anion Gap: 10 mmol/L (ref 7–15)
BUN: 10 mg/dL (ref 6–20)
Bicarbonate: 30 mmol/L — ABNORMAL HIGH (ref 22–29)
Bilirubin, Tot: 0.32 mg/dL (ref <1.2)
Calcium: 10.4 mg/dL (ref 8.5–10.6)
Chloride: 101 mmol/L (ref 98–107)
Creatinine: 0.83 mg/dL (ref 0.51–0.95)
GFR: >60 mL/min
Glucose: 68 mg/dL — ABNORMAL LOW (ref 70–99)
Potassium: 3.7 mmol/L (ref 3.5–5.1)
Sodium: 141 mmol/L (ref 136–145)
Total Protein: 7.3 g/dL (ref 6.0–8.0)

## 2018-10-19 LAB — TOTAL B CELL (CD19), BLOOD
CD19 B-Cell %: <1 % — ABNORMAL LOW (ref 6–23)
CD19 B-Cell Abs: <32 cells/uL — ABNORMAL LOW (ref 50–350)

## 2018-10-19 LAB — SED RATE, BLOOD: Sed Rate: 7 mm/hr (ref 0–30)

## 2018-10-19 LAB — IGE, BLOOD: IGE: 63 [IU]/mL (ref 0–99)

## 2018-10-19 LAB — C-REACTIVE PROTEIN, BLOOD: CRP: 0.04 mg/dL (ref <0.5)

## 2018-10-19 MED ORDER — GADOBUTROL 1 MMOL/ML IV SOLN
6.0000 mL | Freq: Once | INTRAVENOUS | Status: AC
Start: 2018-10-19 — End: 2018-10-19
  Administered 2018-10-19: 13:00:00 6 mL via INTRAVENOUS
  Filled 2018-10-19: qty 6

## 2018-10-19 MED ORDER — GADOBUTROL 1 MMOL/ML IV SOLN
INTRAVENOUS | Status: AC
Start: 2018-10-19 — End: 2018-10-19
  Filled 2018-10-19: qty 7.5

## 2018-10-20 ENCOUNTER — Encounter (INDEPENDENT_AMBULATORY_CARE_PROVIDER_SITE_OTHER): Payer: Self-pay | Admitting: Family Practice

## 2018-10-21 LAB — TOTAL B CELLS (CD20), BLOOD
B Cells (CD20) %: <1 cell/uL — ABNORMAL LOW (ref 6–23)
B Cells (CD20) Absolute: <32 cells/uL — ABNORMAL LOW (ref 50–350)

## 2018-10-21 LAB — IGG SUBCLASS PANEL, BLOOD
IGG Subclass 1: 536 mg/dL (ref 240–1118)
IGG Subclass 2: 389 mg/dL (ref 124–549)
IGG Subclass 3: 17 mg/dL — ABNORMAL LOW (ref 21–134)
IGG Subclass 4: 150 mg/dL — ABNORMAL HIGH (ref 1–123)

## 2018-10-25 ENCOUNTER — Encounter (INDEPENDENT_AMBULATORY_CARE_PROVIDER_SITE_OTHER): Payer: Medicare Other | Admitting: Psychiatric/Mental Health

## 2018-11-03 ENCOUNTER — Ambulatory Visit (HOSPITAL_BASED_OUTPATIENT_CLINIC_OR_DEPARTMENT_OTHER): Payer: Medicare Other

## 2018-11-08 ENCOUNTER — Encounter (HOSPITAL_BASED_OUTPATIENT_CLINIC_OR_DEPARTMENT_OTHER): Payer: Self-pay | Admitting: Neurology

## 2018-11-10 ENCOUNTER — Encounter (INDEPENDENT_AMBULATORY_CARE_PROVIDER_SITE_OTHER): Payer: Self-pay | Admitting: Family Practice

## 2018-11-10 ENCOUNTER — Ambulatory Visit (INDEPENDENT_AMBULATORY_CARE_PROVIDER_SITE_OTHER): Payer: Medicare Other | Admitting: Family Practice

## 2018-11-10 VITALS — BP 134/84 | HR 91 | Temp 98.1°F | Resp 22 | Ht 64.0 in | Wt 132.9 lb

## 2018-11-10 DIAGNOSIS — R42 Dizziness and giddiness: Secondary | ICD-10-CM

## 2018-11-10 DIAGNOSIS — G35 Multiple sclerosis: Secondary | ICD-10-CM

## 2018-11-10 DIAGNOSIS — Z1239 Encounter for other screening for malignant neoplasm of breast: Secondary | ICD-10-CM

## 2018-11-10 DIAGNOSIS — E039 Hypothyroidism, unspecified: Secondary | ICD-10-CM

## 2018-11-10 DIAGNOSIS — R252 Cramp and spasm: Secondary | ICD-10-CM

## 2018-11-10 MED ORDER — TIZANIDINE HCL 4 MG OR TABS
6.0000 mg | ORAL_TABLET | Freq: Every evening | ORAL | 0 refills | Status: DC
Start: 2018-11-10 — End: 2018-11-24

## 2018-11-10 MED ORDER — MECLIZINE HCL 25 MG OR TABS
25.0000 mg | ORAL_TABLET | Freq: Three times a day (TID) | ORAL | 0 refills | Status: DC | PRN
Start: 2018-11-10 — End: 2018-11-24

## 2018-11-10 NOTE — Patient Instructions (Signed)
Please call the neurologist office today    Please follow up with me in 1-2 months

## 2018-11-10 NOTE — Progress Notes (Signed)
SUBJECTIVE:  Rosy Estabrook is a 58 year old female here for vertigo follow up    Vertigo- improving, not having as often, improved on meclizine  She is doing yoga at home, able to do some yoga  She is concerned it is related to her MS    MS- she has been trying to reach out to neurology, wants to know if medication she is on is appropriate, might be going to [place] for daughter's wedding (driving) and concerned about exposure to Covid 19  She had MRI of brain    She will get mammogram after September    Hypothyroidism- feeling ok, does feel like her sex drive is pretty low  In future may want to see women's health about HRT to see if helps with libido    Her anxiety is managemeble      Review of Systems -   See HPI    HISTORY:  Patient Active Problem List   Diagnosis   . Multiple sclerosis (CMS-HCC)   . IgG4 related disease (CMS-HCC)   . Uveitis of right eye   . 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 Mood disorder (CMS-HCC)   . PTSD (post-traumatic stress disorder)   . Adequate exercise, at or above goal   . Rotator cuff tear     Past Medical History:   Diagnosis Date   . Asthma 1991    with pregnancy   . Hypothyroidism    . Major depressive disorder, single episode    . MS (multiple sclerosis) (CMS-HCC)      Past Surgical History:   Procedure Laterality Date   . leep      for cervical dysplasia   . LYMPH NODE BIOPSY      IgG4 related disease   . TONSILLECTOMY AND ADENOIDECTOMY      only tonsillectomy       Current Outpatient Medications on File Prior to Visit   Medication Sig Dispense Refill   . albuterol (VENTOLIN HFA) 108 (90 Base) MCG/ACT inhaler Inhale 2 puffs by mouth every 6 hours as needed for Wheezing. 3 Inhaler 3   . Amantadine HCl 100 MG tablet Take 100 mg by mouth 2 times daily. 60 tablet 0   . baclofen (LIORESAL) 10 MG tablet TAKE 1 TABLET BY MOUTH THREE TIMES A DAY 90 tablet 11   . buPROPion (WELLBUTRIN) 75 MG tablet Take 1 tablet (75 mg) by mouth 2 times daily. 180 tablet 1   .  diazepam (VALIUM) 5 MG tablet Take 1 tablet (5 mg) by mouth daily as needed for Anxiety. 30 tablet 1   . DULoxetine (CYMBALTA) 60 MG CR capsule Take 2 capsules (120 mg) by mouth daily. 180 capsule 3   . gabapentin (NEURONTIN) 300 MG capsule Take 2 capsules (600 mg) by mouth nightly. 60 capsule 5   . [DISCONTINUED] meclizine (ANTIVERT) 25 MG tablet Take 1 tablet (25 mg) by mouth every 8 hours as needed for Dizziness. 30 tablet 0   . modafinil (PROVIGIL) 200 MG tablet Take 1 tablet (200 mg) by mouth every morning. 30 tablet 0   . nortriptyline (PAMELOR) 25 MG capsule TAKE 2 CAPSULES BY MOUTH NIGHTLY 180 capsule 2   . prednisoLONE (ORAPRED) 15 MG/5ML solution Take 15 mg by mouth daily.     . prednisoLONE acetate (PRED FORTE) 1 % ophthalmic suspension Place 1 drop into both eyes 4 times daily. 1 bottle 3   . thyroid (ARMOUR THYROID) 15 MG tablet Take 1  tablet (15 mg) by mouth every other day. 45 tablet 2   . thyroid (ARMOUR THYROID) 60 MG tablet Take 1 tablet (60 mg) by mouth daily. 90 tablet 2   . [DISCONTINUED] tiZANidine (ZANAFLEX) 4 MG tablet Take 1.5 tablets (6 mg) by mouth at bedtime. 45 tablet 5     No current facility-administered medications on file prior to visit.      Allergies   Allergen Reactions   . Penicillins Rash   . Modafinil Diarrhea and Nausea and Vomiting     Family History   Problem Relation Name Age of Onset   . Other Mother Zara Chess    . Thyroid Mother Zara Chess    . Heart Disease Mother Zara Chess         heart failure   . Cancer Father don mc clain         lung   . Cancer Brother tom mcclain    . Heart Disease Brother tom mcclain         heart attack   . Cancer Sister Zara Chess         breast   . Diabetes Sister suzanne mcclain    . Thyroid Sister suzanne mcclain         car accident   . Stroke Brother [place]    . Cancer Brother Jeremy Johann         prostate cancer   . Cancer Brother john mc clain         copd lungs     Social History     Socioeconomic History   . Marital  status: Divorced     Spouse name: Not on file   . Number of children: Not on file   . Years of education: Not on file   . Highest education level: Not on file   Occupational History   . Not on file   Social Needs   . Financial resource strain: Not on file   . Food insecurity:     Worry: Not on file     Inability: Not on file   . Transportation needs:     Medical: Not on file     Non-medical: Not on file   Tobacco Use   . Smoking status: Former Smoker     Packs/day: 1.00     Years: 15.00     Pack years: 15.00     Types: Cigarettes     Last attempt to quit: 01/27/2016     Years since quitting: 2.7   . Smokeless tobacco: Never Used   Substance and Sexual Activity   . Alcohol use: Yes     Alcohol/week: 2.0 standard drinks     Types: 2 Glasses of wine per week   . Drug use: Not on file   . Sexual activity: Yes     Partners: Male     Birth control/protection: Rhythm   Lifestyle   . Physical activity:     Days per week: Not on file     Minutes per session: Not on file   . Stress: Not on file   Relationships   . Social connections:     Talks on phone: Not on file     Gets together: Not on file     Attends religious service: Not on file     Active member of club or organization: Not on file     Attends meetings of clubs or organizations: Not on file  Relationship status: Not on file   . Intimate partner violence:     Fear of current or ex partner: Not on file     Emotionally abused: Not on file     Physically abused: Not on file     Forced sexual activity: Not on file   Other Topics Concern   . Military Service No   . Blood Transfusions No   . Caffeine Concern No   . Occupational Exposure No   . Hobby Hazards No   . Sleep Concern No   . Stress Concern No   . Weight Concern No   . Special Diet No   . Back Care No   . Exercise Not Asked   . Bike Helmet Not Asked   . Seat Belt Not Asked   . Self-Exams Not Asked   . Exercises Regularly Yes   . Bike Helmet Use Yes   . Seat Belt Use Yes   . Performs Self-Exams Yes   Social  History Narrative    Moved from [place] is great, high fiber, she is very conscientious of effect of diet on health.    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    Hasn't had a cigarette in 8 months.    Has smoked 26 years, less than a pack a day.    Currently drinks 2-4 drinks a day on the weekends, total 4-8 per week.    Sexually active with one female partner.    Currently not working, has been on disability for most of the time from 1992 to now.       OBJECTIVE:    BP 134/84   Pulse 91   Temp 98.1 F (36.7 C) (Temporal)   Resp 22   Ht 5\' 4"  (1.626 m)   Wt 60.3 kg (132 lb 15 oz)   SpO2 98%   BMI 22.82 kg/m     Body mass index is 22.82 kg/m.  Wt Readings from Last 5 Encounters:   11/10/18 60.3 kg (132 lb 15 oz)   10/16/18 60.6 kg (133 lb 9.6 oz)   09/18/18 60.7 kg (133 lb 14.4 oz)   06/12/18 59.9 kg (132 lb 0.9 oz)   05/26/18 60.9 kg (134 lb 4.2 oz)     Blood Pressure   11/10/18 134/84   10/16/18 117/82   09/18/18 116/74   06/13/18 123/79   06/12/18 104/67       General: WDWN, NAD, answers appropriately  Eyes: anicteric, EOMI  Respiratory: able to speak in full sentences, no accessory muscle use noted  Skin: good skin turgor  Psych: alert and oriented x 3, appropriate affect    Studies:    10/19/2018 3:04 PM - Electronic Interface To Epic, Radiant Results     Narrative & Impression     EXAM DESCRIPTION:  MRI BRAIN WO/W CONTRAST    CLINICAL HISTORY:  58 year old female with vertigo. History of multiple sclerosis, relapsing form.    TECHNIQUE:  MR imaging was performed on a  1.5 Tesla superconducting magnet using T2, FLAIR, T1, 3D SWAN, and diffusion weighted pulse sequences. Following intravenous injection of 6 mL of Gadavist, additional T1-weighted images were obtained in axial and coronal planes.    COMPARISON:  MRI brain 08/10/2017    FINDINGS:  There are several T2/FLAIR hyperintense lesions in the periventricular and juxtacortical white matter as well as in the central midbrain, similar to the prior MRI  of 08/10/2017, compatible with demyelinating plaques. No  definite evidence of new lesions.    There is mild diffuse parenchymal volume loss. There is no midline shift, herniation, or hydrocephalus.    No restricted diffusion.  No definite evidence of intracranial hemorrhage. No abnormal susceptibility.    No abnormal intracranial enhancement.    The major vascular flow voids are normal.    Paranasal sinuses are clear. Mastoid air cells are essentially clear.    Calvarium and skull base are unremarkable.             Signed by: Loyal Jacobson 10/19/2018 15:02:02  IMPRESSION:  IMPRESSION:  1. No acute intracranial findings. No abnormal intracranial enhancement.    2. Several T2/FLAIR hyperintense lesions periventricular and juxtacortical white matter as well as in the central midbrain, similar to the prior MRI of 08/10/2017, compatible with demyelinating plaques in this patient with multiple sclerosis. No definite evidence of new lesions. No enhancing lesions to suggest active demyelination.     Results for SHEZA, STRICKLAND (MRN [id number]) as of 11/11/2018 12:26   Ref. Range 03/28/2018 15:25 06/13/2018 14:26   TSH Latest Ref Range: 0.27 - 4.20 uIU/mL 0.26 (L) 0.47     ASSESMENT AND PLAN: 58 yo female with PMH of multiple sclerosis followed by neurology, history of IgG4 related disease, hypothyroidism here for follow up vertigo. Her vertigo is improving, she had MRI that appeared similar to prior however she will reach out to neurologist to evaluate if vertigo is related to her MS; will also check labs for her hypothyroidism as she still notes decreased libido.      ICD-10-CM ICD-9-CM    1. Hypothyroidism, unspecified type E03.9 244.9 TSH, Blood - See Instructions      Free Thyroxine, Blood Green Plasma Separator Tube   2. Spasticity R25.2 781.0 tiZANidine (ZANAFLEX) 4 MG tablet   3. Vertigo R42 780.4 meclizine (ANTIVERT) 25 MG tablet   4. Screening for breast cancer Z12.39 V76.10 Screening Mammogram With Digital  Breast Tomosynthesis - Bilateral   5. Multiple sclerosis (CMS-HCC) G35 340        F/U: Return in about 6 weeks (around 12/22/2018).     Medication Management:  Medications reviewed with patient and medication list reconciled.  Over the counter medications, herbal therapies and supplements reviewed.  Patient's understanding and response to medications assessed.    Barriers to medications assessed and addressed.  Risks, benefits, alternatives to medications reviewed.    Randa Ngo, MD  Department of Family Medicine and [place], [place] [facility]  HS Assistant Clinical Professor, [employer]

## 2018-11-16 NOTE — Telephone Encounter (Signed)
Pt is calling to follow up regarding previous MyChart message dated for 7/22. Turnaround time provided.     Please advise.   Ph. [phone]

## 2018-11-20 DIAGNOSIS — G894 Chronic pain syndrome: Secondary | ICD-10-CM | POA: Diagnosis not present

## 2018-11-20 DIAGNOSIS — Z1389 Encounter for screening for other disorder: Secondary | ICD-10-CM | POA: Diagnosis not present

## 2018-11-20 DIAGNOSIS — M5136 Other intervertebral disc degeneration, lumbar region: Secondary | ICD-10-CM | POA: Diagnosis not present

## 2018-11-20 DIAGNOSIS — M546 Pain in thoracic spine: Secondary | ICD-10-CM | POA: Diagnosis not present

## 2018-11-20 DIAGNOSIS — Z8781 Personal history of (healed) traumatic fracture: Secondary | ICD-10-CM | POA: Diagnosis not present

## 2018-11-22 ENCOUNTER — Encounter (HOSPITAL_BASED_OUTPATIENT_CLINIC_OR_DEPARTMENT_OTHER): Payer: Self-pay | Admitting: Hospital

## 2018-11-24 ENCOUNTER — Ambulatory Visit: Payer: Medicare Other | Attending: Neurology | Admitting: Neurology

## 2018-11-24 ENCOUNTER — Encounter (INDEPENDENT_AMBULATORY_CARE_PROVIDER_SITE_OTHER): Payer: Self-pay | Admitting: Family Practice

## 2018-11-24 ENCOUNTER — Telehealth (HOSPITAL_BASED_OUTPATIENT_CLINIC_OR_DEPARTMENT_OTHER): Payer: Self-pay | Admitting: Neurology

## 2018-11-24 DIAGNOSIS — G894 Chronic pain syndrome: Secondary | ICD-10-CM | POA: Insufficient documentation

## 2018-11-24 DIAGNOSIS — R42 Dizziness and giddiness: Secondary | ICD-10-CM

## 2018-11-24 DIAGNOSIS — G35 Multiple sclerosis: Secondary | ICD-10-CM | POA: Insufficient documentation

## 2018-11-24 DIAGNOSIS — M797 Fibromyalgia: Secondary | ICD-10-CM | POA: Insufficient documentation

## 2018-11-24 DIAGNOSIS — F339 Major depressive disorder, recurrent, unspecified: Secondary | ICD-10-CM | POA: Insufficient documentation

## 2018-11-24 DIAGNOSIS — R252 Cramp and spasm: Secondary | ICD-10-CM | POA: Insufficient documentation

## 2018-11-24 MED ORDER — DULOXETINE HCL 60 MG OR CPEP
120.0000 mg | ORAL_CAPSULE | Freq: Every day | ORAL | 3 refills | Status: DC
Start: 2018-11-24 — End: 2019-06-01

## 2018-11-24 MED ORDER — MECLIZINE HCL 25 MG OR TABS
25.0000 mg | ORAL_TABLET | Freq: Three times a day (TID) | ORAL | 0 refills | Status: DC | PRN
Start: 2018-11-24 — End: 2020-05-12

## 2018-11-24 MED ORDER — BACLOFEN 10 MG OR TABS
10.0000 mg | ORAL_TABLET | Freq: Three times a day (TID) | ORAL | 11 refills | Status: DC
Start: 2018-11-24 — End: 2020-04-02

## 2018-11-24 MED ORDER — TIZANIDINE HCL 4 MG OR TABS
6.0000 mg | ORAL_TABLET | Freq: Every evening | ORAL | 0 refills | Status: DC
Start: 2018-11-24 — End: 2018-12-06

## 2018-11-24 MED ORDER — GABAPENTIN 300 MG OR CAPS
600.0000 mg | ORAL_CAPSULE | Freq: Every evening | ORAL | 5 refills | Status: DC
Start: 2018-11-24 — End: 2019-06-01

## 2018-11-24 MED ORDER — NORTRIPTYLINE HCL 25 MG OR CAPS
50.0000 mg | ORAL_CAPSULE | Freq: Every evening | ORAL | 3 refills | Status: DC
Start: 2018-11-24 — End: 2019-06-01

## 2018-11-24 NOTE — Patient Instructions (Signed)
1. Try using diazepam 2.5 mg (1/2 tab) up to three times a day for severe vertigo  2. Get Scopalamine patches for trip to [place]  3. Follow-up 6 months

## 2018-11-24 NOTE — Progress Notes (Signed)
---------------------(data below generated by Audie Stayer Kristen Loader, MD)--------------------    Patient Verification & Telemedicine Consent:    I am proceeding with this evaluation at the direct request of the patient.  I have verified this is the correct patient and have obtained verbal consent and written consent from the patient/ surrogate to perform this voluntary telemedicine evaluation (including obtaining history, performing examination and reviewing data provided by the patient).   The patient/ surrogate has the right to refuse this evaluation.  I have explained risks (including potential loss of confidentiality), benefits, alternatives, and the potential need for subsequent face to face care. Patient/ surrogate understands that there is a risk of medical inaccuracies given that our recommendations will be made based on reported data (and we must therefore assume this information is accurate).  Knowing that there is a risk that this information is not reported accurately, and that the telemedicine video, audio, or data feed may be incomplete, the patient agrees to proceed with evaluation and holds [place] harmless knowing these risks. In this evaluation, we will be providing recommendations only.  The ultimate decision to follow, or not follow, these recommendations will be left to the bedside treating/ requesting practitioner.  The patient/ surrogate has been notified that other healthcare professionals (including students, residents and Metallurgist) may be involved in this audio-video evaluation.   All laws concerning confidentiality and patient access to medical records and copies of medical records apply to telemedicine.  The patient/ surrogate has received the [place] Notice of Privacy Practices.  I have reviewed this above verification and consent paragraph with the patient/ surrogate.  If the patient is not capacitated to understand the above, and no surrogate is available, since this is not an  emergency evaluation, the visit will be rescheduled until such time that the patient can consent, or the surrogate is available to consent.    Demographics:   Medical Record #: [id number]   Date: November 24, 2018   Patient Name: Debra Bolton   DOB: 1961/01/21  Age: 58 year old  Sex: female  Location: Home address on file      Evaluator(s):   Debra Bolton was evaluated by me today.    Clinic Location:  [facility]  [place] [address]    Justification for Telehealth Service:  Due to the COVID-19 Pandemic and a federally declared state of public health emergency, this service is being conducted via video visit.     [employer] [facility]    First visit: 12/09/2015     Last visit: 05/26/2018 Current visit: 11/24/2018      Principle Neurological Diagnosis:   1. Clinically definite Multiple Sclerosis, relapsing form  2. Anterior uveitis  3. IgG4 related lymphadenopathy  4. Vestibular neuronitis    Identifying information: Debra Bolton is a 58 year old woman living in [place] for the past 2 years, but originally from [place], seen for 27 years by Dr. Lavon Paganini at [facility] [place]. 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 We reviewed the history and problem list in detail today which is documented in the following sections.     Her problem list was reviewed and updated below:  _____________________________________________________________________    MS Problem List:   **1. Mobility (ND;range 0-6):2 recent falls;  difficulty with balance  2. Hand Function (ND;range 0-5):right hand clumsiness  3. Vision (ND; range 0-5): Decreased vision OD>OS  **4. Fatigue ( ND; range 0-5): provigil 200 mg in morning prn makes her nausea and shakey so uses it sparingly; so will use amantadine sometimes  instead which does not work as well    5. Cognition ( ND; range 0-5):episodes of word finding difficulties within past several years  6. Bowel/Bladder ( ND; range 0-5): rare bowel incontinence starting 10 years ago, infrequent urinary retention and hesitancy;    7. Sensory ( ND; range 0-5): bilateral leg tingling and pain (right > left), slightly increasing arm tingling (right >>left)  8. Spasticity ( ND; range 0-5): bilateral legs, improved with yoga and now graduating as an Art therapist  **9. Pain ( ND; range 0-5): lower extremity tingling and burning pain; currently on 600 mg qhs of gabapetin and cymbalta 60 mg qhs    MS Performance Scale Score (sum of problem list scores 1-9):  ND  (result ranges from 0 (normal) to 46 (maximal interference of symptoms on life))    10. Affective: (BDI II:  ND) longstanding history of depression  11. Social Support: lives with boyfriend, 3 daughters none present in [place] - in [place], one in [place] and one in [place]. Other symptoms or problems:  vestibular neuronitis with residual positional vertigo  ______________________________________________________________________    MS History      Initial Symptoms (Date and Description): 1987 (~age 51), after delivery of first daughter, both legs would fall asleep during exercise  1988 grand mal seizure in doctor office  1989 another grand mal seizure in [place] while on vacation, was placed on tegretol for 2 years  1991 diagnosed with MS as she had more frequent episodes of fatigue, depression and numbness     Progressive Disease (approx onset and description): see above    Cumulative [place] Visits (since 05/26/2018)    -Visit 05/26/2018: Reports 2 falls recently feels she is tripping and catching her feet (R>L); concerned that her balance is more off lately. Feels her legs are "jittery" or have subtle tremors/shakes; sees black spots in the periphery of R eye constantly; She tolerated the RItuximab well (last infusion on  01/16/2018); Initially noticed improvement in hoarse voice (related to swollen cervical LNs from IgG4 syndrome) but now returning;Dr Truman Hayward felt uveitis OD better on 09/09/17 but she feels inflammation is returning. Dr Truman Hayward is waiting several months to make sure uveitis resolved before doing cataract surgery; Continues to have mouth ulcers continue to occur intermittently (sore raised white spot without bleeding but painful).     -Telephone visit 11/24/2018: Acute onset severe vertigo 10/05/2018 when she got up to go the bathroom. Persisted with nausea and ataxia ; bedridden for 2 days before vertigo became intermittent and positional; still occurring but 80 % improved. MRI brain 10/19/2018 with no new enlarging or active lesions. Brainstem looks normal  Reports amantadine helps fatigue but takes prn because she get too jittery with regular usage  Discussed risks of rituximab with COVID 19. Somewhat of a moot point since she received an infusion last on 09/18/2018. Traveling by car to [place] for daughter's small outdoor wedding in September. Discussed precautions        Disease Modifying Treatment History: [Documented Needle phobia: No]    1. Betaseron (~ 1 year on therapy)  2. Copaxone ( <1 year) discontinued because it worsened depression  3. Tysabri (~5625 - Sep 11, 2015)  4. Rituximab 500 mg X 2 doses (07/01/17 & 07/19/17), 1000 mg X 2 (01/02/18 & 01/16/18), 1000 mg X 1 (09/19/2018)    ______________________________________________________________________    Review of Systems: Complete 10 point review of symptoms from the patient self report questionnaire was reviewed with the patient today and scanned into the media tab. Any pertinent positives are listed above in the narrative history or problem list.    Allergies   Allergen Reactions   . Penicillins Rash   . Modafinil Diarrhea and Nausea and Vomiting       Current Outpatient Medications   Medication Sig   . albuterol (VENTOLIN HFA) 108 (90 Base) MCG/ACT inhaler Inhale 2 puffs  by mouth every 6 hours as needed for Wheezing.   . Amantadine HCl 100 MG tablet Take 100 mg by mouth 2 times daily.   . baclofen (LIORESAL) 10 MG tablet TAKE 1 TABLET BY MOUTH THREE TIMES A DAY   . buPROPion (WELLBUTRIN) 75 MG tablet Take 1 tablet (75 mg) by mouth 2 times daily.   . diazepam (VALIUM) 5 MG tablet Take 1 tablet (5 mg) by mouth daily as needed for Anxiety.   . DULoxetine (CYMBALTA) 60 MG CR capsule Take 2 capsules (120 mg) by mouth daily.   Marland Kitchen gabapentin (NEURONTIN) 300 MG capsule Take 2 capsules (600 mg) by mouth nightly.   . meclizine (ANTIVERT) 25 MG tablet Take 1 tablet (25 mg) by mouth every 8 hours as needed for Dizziness.   . modafinil (PROVIGIL) 200 MG tablet Take 1 tablet (200 mg) by mouth every morning.   . nortriptyline (PAMELOR) 25 MG capsule TAKE 2 CAPSULES BY MOUTH NIGHTLY   . prednisoLONE (ORAPRED) 15 MG/5ML solution Take 15 mg by mouth daily.   . prednisoLONE acetate (PRED FORTE) 1 % ophthalmic suspension Place 1 drop into both eyes 4 times daily.   Marland Kitchen thyroid (ARMOUR THYROID) 15 MG tablet Take 1 tablet (15 mg) by mouth every other day.   . thyroid (ARMOUR THYROID) 60 MG tablet Take 1 tablet (60 mg) by mouth daily.   Marland Kitchen tiZANidine (ZANAFLEX) 4 MG tablet Take 1.5 tablets (6 mg) by mouth at bedtime.     No current facility-administered medications for this visit.        Past Medical History:   Diagnosis Date   . Asthma 1991    with pregnancy   . Hypothyroidism    . Major depressive disorder, single episode    . MS (multiple sclerosis) (CMS-HCC)      Past Surgical History:   Procedure Laterality Date   . leep      for cervical dysplasia   . LYMPH NODE BIOPSY      IgG4 related disease   . TONSILLECTOMY AND ADENOIDECTOMY      only tonsillectomy       Family Medical History:  Family History   Problem Relation Name Age of Onset   . Other Mother Zara Chess    . Thyroid Mother Zara Chess    . Heart Disease Mother Zara Chess         heart failure   . Cancer Father don mc clain         lung   .  Cancer Brother tom mcclain    . Heart Disease Brother tom mcclain         heart attack   . Cancer Sister Zara Chess         breast   . Diabetes Sister suzanne  mcclain    . Thyroid Sister suzanne mcclain         car accident   . Stroke Brother [place]    . Cancer Brother Jeremy Johann         prostate cancer   . Cancer Brother john mc clain         copd lungs     ______________________________________________________________________    Examination (no exam today):   There were no vitals taken for this visit.  General: Examination of the skin, joints and extremities revealed no abnormalities  ;  There were no cervical, ocular or cranial bruits.     Cognitive/Behavioral: Examination of cognition, language and prosody revealed no abnormalities . Behavioral and affect was appropriate.    Cranial Nerves: Near visual acuity wasJ10 OD and J2 OS with PH (previously 20/400 OD and J7 OS without correction).Visual fields were full  to confrontation testing. Lids were normal .  Pupils were 17m  and reactive with no  RAPD . Fundoscopic exam revealed no definite optic nerve pallor. Eyes were orthotropic with bilateral rotary nystagmus and left end point nystagmus.  Pursuit and saccadic eye movements revealed few beats of lateral nystagmus in both directions. Facial movements revealed no abnormalities. Testing of facial sensation revealed no abnormalities. Hearing was normal  AD and normal  AS. Bulbar examination revealed no abnormalities .     Motor: Upper extremity strength was as follows (R/L): Shoulder abduction 5/5, Elbow extension 5/5, Wrist extension 5/5, Hand instrinsics 5/5. Rapid alternating movements were graded 1/1+. Spasticity ([place]) was rated 1/1. Lower extremity strength was as follows (R/L): Hip Flexors 5/5, Knee Flexors 5/5, Ankle dorsiflexors 5/5; Toe tapping was rating 1/1; Spasticity was rated 1+/1  There was no atrophy. There were no fasiculations.    Cerebellar: Finger to nose testing revealed no  abnormalities  in the RUE and no abnormalities  in the LUE. Heel to shin testing revealed no abnormalities  in the RLE and no abnormalities  in the LLE. (see gait for description of midline/axial cerebellum dysfunction)    Sensory: Sensation to pp and temperature was normal. Vibration sensation duration (secs) was (R/L): Upper extremity middle finger ND/ND; Lower extremity big toe   Joint position sensation was normal .      Reflexes: (R/L): Biceps 2+/2+, BR 2+/2+, Triceps 2+/2+, Patella 2+/2+, Ankle 2/2. There was no clonus .     Gait Description: normal gait and station.  Ambulation was performed safely without  assistance. The patient was able to  tandem walk 8 steps without faltering.   ----------------------------------------------------------------------------------------------------------------------  Performance Measures:  9-Hole PEG Test 05/26/2018 06/17/2017   RUE  19.8 21.5   RUE (Best) 17.9 19.5   LUE 19.8 22.7   LUE (Best) 18.5 19.6       25 Ft. Ambulation Time 05/26/2018 06/17/2017   25 ft Ambulation Time - 1st Trial (Seconds) 4.5 4.5   Independently or With Assistance? Independently Independently   25 ft Ambulation Time - 2nd Trial (Seconds) 4.6 4.5   Independently or With Assistance? Independently Independently       No flowsheet data found.    No flowsheet data found.    No flowsheet data found.    No flowsheet data found.    ----------------------------------------------------------------------------------------------------------------------    Review of Imaging Studies:  1. MRI brain 08/10/17 reviewed and compared to prior MRI dated 07/16/16: [facility]assic MS; New T2=3 (small discrete and all in DWM); relatively low burden. No gad lesions. Mild atrophy for  age    Review of Labs:  1. CSF reportedly with oligoclonal bands  JC virus 0.24  (09/11/15), 0.20 (01/02/16)    Assessment:   1.  Clinically definite Multiple Sclerosis, relapsing form, Stable clinically. MRI 08/10/2017 without activity but New T2 lesions since  study dated 07/16/16; now on rituximab and tolerating well; repeat MRI 10/19/2018 (done because of vertigo) with no evidence of disease activity    Atypical features (red flags) include: seizure at onset  Individual risk factors for disease activity and worsening disability include: None known but no imaging studies available    2.  Neuropathic pain: better on current regimen    3.  Anxiety and depression: better    4. Fatigue: . Sleeps well and depression well controlled. No obvious medication causes and hypothyroidism well controlled. Provigil causes nausea.Amantadine helps     5. 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     6. IgG 4 related lymphadenopathy; isolated to cervical lymph notes; stable symptomatically    7. Recent vestibular neuronitis (started 10/05/2018) now 80 % improved but still with positional vertigo although more difficult to provoke     After discussing the options available and the potential benefits (pros) and risks (cons), we mutually decided on the following management plan    Plan:  1. Try using diazepam 2.5 mg (1/2 tab) up to three times a day for severe vertigo  2. Get Scopalamine patches for trip to [place]  3. Continue Rituximab but may be able to extend interval between infusions  4. Follow-up 6 months     Time spent reviewing chart and outside records, communications and imaging studies in preparation for visit: 30 minutes  FTF  visit start time : 1:00  FTF visit completion time : 1:27  Post visit time to complete orders,documentation and communication with providers:10 minutes  Total Time:67 minutes

## 2018-11-24 NOTE — Telephone Encounter (Signed)
I placed a call to the patient post my chart video visit / telephone consult appointment.    I reviewed with them their AVS, reviewed their MD orders, and confirmed their preferred pharmacy.    I scheduled their future appointment as needed, and attached the MD orders to the visit.    . Telephone visits - I have mailed the the AVS.    . My chart Video Visits - informed patient that they can find their AVS under their visit in my chart.

## 2018-11-24 NOTE — Telephone Encounter (Signed)
I placed a call 30 minutes prior to the patients telemedicine appointment.  LVM as a courtesy reminder of telemed appointment and call back number.   Unable to proceed with questions.

## 2018-11-27 NOTE — Telephone Encounter (Signed)
From: Debra Bolton  To: Christin Bach, MD  Sent: 11/24/2018 6:31 PM PDT  Subject: 1-Non Urgent Medical Advice    Dear Dr. Rana Snare,      I talked to the neurologist about vertigo, he thinks it is vestibular neurotis (sp.). Dr. Rosendo Gros suggested I may need to get the scopolamine ear patches to help with the vertigo while driving across country for my daughters wedding. Can you call that medication in for me? He also just suggested being careful and keeping my distance.     I also sent paperwork to your office that needs to be completed to continue [employer] disability payments, they require this yearly. I also sent a copy of the paperwork we filled out last year, and where to send and or fax the information.     I will take care of the mammogram and blood tests when I return, thank you very much for soothing my fears and taking care of my issues. I appreciate you!    Until then stay safe and well  Debra Bolton

## 2018-11-29 MED ORDER — SCOPOLAMINE 1 MG/3DAYS TD PT72
1.0000 | MEDICATED_PATCH | TRANSDERMAL | 0 refills | Status: DC
Start: 2018-11-29 — End: 2019-02-08

## 2018-12-05 ENCOUNTER — Other Ambulatory Visit (INDEPENDENT_AMBULATORY_CARE_PROVIDER_SITE_OTHER): Payer: Self-pay | Admitting: Family Practice

## 2018-12-05 DIAGNOSIS — R252 Cramp and spasm: Secondary | ICD-10-CM

## 2018-12-05 NOTE — Telephone Encounter (Signed)
tiZANidine  is not currently included in the Pharmacy Refill Clinic protocols. Re-routing to the responsible staff for processing.  Thank you!

## 2018-12-06 MED ORDER — TIZANIDINE HCL 4 MG OR TABS
6.00 mg | ORAL_TABLET | Freq: Every evening | ORAL | 0 refills | Status: DC
Start: 2018-12-06 — End: 2018-12-26

## 2018-12-06 NOTE — Telephone Encounter (Signed)
Refill signed.

## 2018-12-06 NOTE — Telephone Encounter (Signed)
Last Prescribed: 11/24/18      Quantity: 45      Refills: 0    Medication(s) requested: tiZANidine (ZANAFLEX) 4 MG tablet      Patient is requesting refill of medication, please see orders for preloaded prescription refill.    Last visit with this MD:    11/10/2018  Next appointment with this MD:   01/08/2019    Last office visit:     11/10/2018  Next office visit:                01/08/2019

## 2018-12-12 ENCOUNTER — Telehealth (INDEPENDENT_AMBULATORY_CARE_PROVIDER_SITE_OTHER): Payer: Self-pay | Admitting: Internal Medicine

## 2018-12-12 DIAGNOSIS — Z681 Body mass index (BMI) 19 or less, adult: Secondary | ICD-10-CM | POA: Diagnosis not present

## 2018-12-12 DIAGNOSIS — E782 Mixed hyperlipidemia: Secondary | ICD-10-CM | POA: Diagnosis not present

## 2018-12-12 DIAGNOSIS — F17218 Nicotine dependence, cigarettes, with other nicotine-induced disorders: Secondary | ICD-10-CM | POA: Diagnosis not present

## 2018-12-12 DIAGNOSIS — Z1159 Encounter for screening for other viral diseases: Secondary | ICD-10-CM | POA: Diagnosis not present

## 2018-12-12 DIAGNOSIS — I2510 Atherosclerotic heart disease of native coronary artery without angina pectoris: Secondary | ICD-10-CM | POA: Diagnosis not present

## 2018-12-12 DIAGNOSIS — J449 Chronic obstructive pulmonary disease, unspecified: Secondary | ICD-10-CM | POA: Diagnosis not present

## 2018-12-12 DIAGNOSIS — E440 Moderate protein-calorie malnutrition: Secondary | ICD-10-CM | POA: Diagnosis not present

## 2018-12-12 DIAGNOSIS — G8929 Other chronic pain: Secondary | ICD-10-CM | POA: Diagnosis not present

## 2018-12-12 DIAGNOSIS — I10 Essential (primary) hypertension: Secondary | ICD-10-CM | POA: Diagnosis not present

## 2018-12-12 NOTE — Telephone Encounter (Signed)
Called patient left message to reschedule [facility] appt to 9/11.

## 2018-12-14 ENCOUNTER — Encounter (INDEPENDENT_AMBULATORY_CARE_PROVIDER_SITE_OTHER): Payer: Self-pay | Admitting: Hospital

## 2018-12-18 DIAGNOSIS — Z1389 Encounter for screening for other disorder: Secondary | ICD-10-CM | POA: Diagnosis not present

## 2018-12-18 DIAGNOSIS — M546 Pain in thoracic spine: Secondary | ICD-10-CM | POA: Diagnosis not present

## 2018-12-18 DIAGNOSIS — G894 Chronic pain syndrome: Secondary | ICD-10-CM | POA: Diagnosis not present

## 2018-12-18 DIAGNOSIS — Z8781 Personal history of (healed) traumatic fracture: Secondary | ICD-10-CM | POA: Diagnosis not present

## 2018-12-18 DIAGNOSIS — M5136 Other intervertebral disc degeneration, lumbar region: Secondary | ICD-10-CM | POA: Diagnosis not present

## 2018-12-25 ENCOUNTER — Other Ambulatory Visit (INDEPENDENT_AMBULATORY_CARE_PROVIDER_SITE_OTHER): Payer: Self-pay | Admitting: Psychiatric/Mental Health

## 2018-12-25 ENCOUNTER — Other Ambulatory Visit (HOSPITAL_BASED_OUTPATIENT_CLINIC_OR_DEPARTMENT_OTHER): Payer: Self-pay | Admitting: Neurology

## 2018-12-25 DIAGNOSIS — F419 Anxiety disorder, unspecified: Secondary | ICD-10-CM

## 2018-12-25 DIAGNOSIS — R252 Cramp and spasm: Secondary | ICD-10-CM

## 2018-12-26 ENCOUNTER — Telehealth (INDEPENDENT_AMBULATORY_CARE_PROVIDER_SITE_OTHER): Payer: Medicare Other | Admitting: Internal Medicine

## 2018-12-26 MED ORDER — TIZANIDINE HCL 4 MG OR TABS
6.00 mg | ORAL_TABLET | Freq: Every evening | ORAL | 3 refills | Status: DC
Start: 2018-12-26 — End: 2019-01-05

## 2018-12-26 MED ORDER — DIAZEPAM 5 MG OR TABS
5.0000 mg | ORAL_TABLET | Freq: Every day | ORAL | 0 refills | Status: DC | PRN
Start: 2018-12-26 — End: 2021-03-09

## 2018-12-26 NOTE — Telephone Encounter (Signed)
Patient of Creig Hines, NP     Patient is requesting a refill on her     diazepam (VALIUM) 5 MG tablet   Sig: Take 1 tablet (5 mg) by mouth daily as needed for Anxiety.    Last visit: 05/02/2018  Next visit: 0  I left a voicemail for patient to contact us to schedule an appointment.     [facility] [place]  P: [phone]  F: [phone]  Address    [address]   [place] [address]        Forwarding to covering provider.       Thank you,     Brand Males, MA

## 2018-12-29 ENCOUNTER — Telehealth (INDEPENDENT_AMBULATORY_CARE_PROVIDER_SITE_OTHER): Payer: Medicare Other | Admitting: Internal Medicine

## 2019-01-02 ENCOUNTER — Ambulatory Visit (INDEPENDENT_AMBULATORY_CARE_PROVIDER_SITE_OTHER): Payer: Medicare Other | Admitting: Audiology

## 2019-01-03 ENCOUNTER — Encounter (INDEPENDENT_AMBULATORY_CARE_PROVIDER_SITE_OTHER): Payer: Self-pay | Admitting: Hospital

## 2019-01-04 ENCOUNTER — Ambulatory Visit (INDEPENDENT_AMBULATORY_CARE_PROVIDER_SITE_OTHER): Payer: Medicare Other | Admitting: Physician Assistant

## 2019-01-05 ENCOUNTER — Other Ambulatory Visit (HOSPITAL_BASED_OUTPATIENT_CLINIC_OR_DEPARTMENT_OTHER): Payer: Self-pay | Admitting: Neurology

## 2019-01-05 DIAGNOSIS — R252 Cramp and spasm: Secondary | ICD-10-CM

## 2019-01-05 NOTE — Telephone Encounter (Signed)
Received a 90 day supply request for tiZANidine (ZANAFLEX) 4 MG tablet from [facility]. Fax forwarded to [facility]

## 2019-01-07 MED ORDER — TIZANIDINE HCL 4 MG OR TABS
6.0000 mg | ORAL_TABLET | Freq: Every evening | ORAL | 3 refills | Status: DC
Start: 2019-01-07 — End: 2019-02-08

## 2019-01-08 ENCOUNTER — Encounter (INDEPENDENT_AMBULATORY_CARE_PROVIDER_SITE_OTHER): Payer: Medicare Other | Admitting: Family Practice

## 2019-01-08 DIAGNOSIS — Z951 Presence of aortocoronary bypass graft: Secondary | ICD-10-CM | POA: Diagnosis not present

## 2019-01-08 DIAGNOSIS — I2510 Atherosclerotic heart disease of native coronary artery without angina pectoris: Secondary | ICD-10-CM | POA: Diagnosis not present

## 2019-01-08 DIAGNOSIS — E785 Hyperlipidemia, unspecified: Secondary | ICD-10-CM | POA: Diagnosis not present

## 2019-01-08 DIAGNOSIS — I10 Essential (primary) hypertension: Secondary | ICD-10-CM | POA: Diagnosis not present

## 2019-01-08 DIAGNOSIS — I6523 Occlusion and stenosis of bilateral carotid arteries: Secondary | ICD-10-CM | POA: Diagnosis not present

## 2019-01-15 DIAGNOSIS — Z8781 Personal history of (healed) traumatic fracture: Secondary | ICD-10-CM | POA: Diagnosis not present

## 2019-01-15 DIAGNOSIS — M546 Pain in thoracic spine: Secondary | ICD-10-CM | POA: Diagnosis not present

## 2019-01-15 DIAGNOSIS — M5136 Other intervertebral disc degeneration, lumbar region: Secondary | ICD-10-CM | POA: Diagnosis not present

## 2019-01-15 DIAGNOSIS — G894 Chronic pain syndrome: Secondary | ICD-10-CM | POA: Diagnosis not present

## 2019-01-15 DIAGNOSIS — Z1389 Encounter for screening for other disorder: Secondary | ICD-10-CM | POA: Diagnosis not present

## 2019-01-31 ENCOUNTER — Telehealth (INDEPENDENT_AMBULATORY_CARE_PROVIDER_SITE_OTHER): Payer: Self-pay | Admitting: Psychiatric/Mental Health

## 2019-01-31 ENCOUNTER — Encounter (INDEPENDENT_AMBULATORY_CARE_PROVIDER_SITE_OTHER): Payer: Self-pay | Admitting: Family Practice

## 2019-01-31 ENCOUNTER — Encounter (INDEPENDENT_AMBULATORY_CARE_PROVIDER_SITE_OTHER): Payer: Self-pay | Admitting: Internal Medicine

## 2019-01-31 NOTE — Telephone Encounter (Addendum)
If patient calls to schedule, reschedule or confirm their appointment for 02/08/19    1. Do you have or have you had in the last 24 hours: :   Fever: no   New cough (not chronic) no   Shortness of breath: no  *Refer patient to PCP if YES for any flu like symptoms    2. Have you knowingly been exposed to anyone having any, some or all of the symptoms listed above?  no    3. Have you traveled outside of the [place] in the last 14 days? no    o If yes to any of these, schedule and or reschedule existing appointment 21 + days out and established patients route to:  1. [facility]: P GIF ADMINISTRATION + Provider  2. [place]: P [facility] FRONT DESK + Provider  New Patients: Document and do not route.      If no to all questions, document and close encounter.  Do not route.        Did you inform patient of no visitor policy Yes

## 2019-01-31 NOTE — Telephone Encounter (Signed)
From: Debra Bolton  To: Christin Bach, MD  Sent: 01/31/2019 12:51 PM PDT  Subject: 1-Non Urgent Medical Advice    Dear Dr. Rana Snare,    I made it through the wedding and road trip to [place] without getting sick. I also canceled the ENT appt. because I ended up staying in [place] longer.  Dr. Rosendo Gros believed the vertigo issue was vestibular neuritis, and the Epley would not help, and it has cleared up.  I am in [place] for a mammogram and office visit with a NP to update meds. I will also get labs done for thyroid...  Let me know if we need an appointment, and I'd be happy to touch base.  As always thanks for helping me stay well  Debra Bolton

## 2019-02-01 NOTE — Telephone Encounter (Signed)
Called patient, no answer. Left message to call clinic to schedule an appointment with Dr. Candiss Norse

## 2019-02-07 ENCOUNTER — Other Ambulatory Visit: Payer: Self-pay

## 2019-02-08 ENCOUNTER — Other Ambulatory Visit (INDEPENDENT_AMBULATORY_CARE_PROVIDER_SITE_OTHER): Payer: Medicare Other | Attending: Family Practice

## 2019-02-08 ENCOUNTER — Ambulatory Visit
Admission: RE | Admit: 2019-02-08 | Discharge: 2019-02-08 | Disposition: A | Discharge status: Home / Self Care | Payer: Medicare Other | Attending: Diagnostic Radiology | Admitting: Diagnostic Radiology

## 2019-02-08 ENCOUNTER — Ambulatory Visit (INDEPENDENT_AMBULATORY_CARE_PROVIDER_SITE_OTHER): Payer: Medicare Other | Admitting: Psychiatric/Mental Health

## 2019-02-08 DIAGNOSIS — E039 Hypothyroidism, unspecified: Secondary | ICD-10-CM | POA: Insufficient documentation

## 2019-02-08 DIAGNOSIS — Z1239 Encounter for other screening for malignant neoplasm of breast: Secondary | ICD-10-CM

## 2019-02-08 DIAGNOSIS — F4310 Post-traumatic stress disorder, unspecified: Secondary | ICD-10-CM

## 2019-02-08 DIAGNOSIS — R928 Other abnormal and inconclusive findings on diagnostic imaging of breast: Secondary | ICD-10-CM | POA: Insufficient documentation

## 2019-02-08 DIAGNOSIS — Z803 Family history of malignant neoplasm of breast: Secondary | ICD-10-CM | POA: Insufficient documentation

## 2019-02-08 DIAGNOSIS — Z1231 Encounter for screening mammogram for malignant neoplasm of breast: Secondary | ICD-10-CM

## 2019-02-08 DIAGNOSIS — F515 Nightmare disorder: Secondary | ICD-10-CM

## 2019-02-08 DIAGNOSIS — F339 Major depressive disorder, recurrent, unspecified: Secondary | ICD-10-CM

## 2019-02-08 LAB — FREE THYROXINE, BLOOD: Free T4: 0.86 ng/dL — ABNORMAL LOW (ref 0.93–1.70)

## 2019-02-08 LAB — TSH, BLOOD: TSH: 0.51 u[IU]/mL (ref 0.27–4.20)

## 2019-02-08 MED ORDER — TIZANIDINE HCL 4 MG OR TABS
6.0000 mg | ORAL_TABLET | Freq: Every evening | ORAL | 3 refills | Status: DC
Start: 2019-02-08 — End: 2020-04-02
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
## 2019-02-09 ENCOUNTER — Encounter (HOSPITAL_BASED_OUTPATIENT_CLINIC_OR_DEPARTMENT_OTHER): Payer: Self-pay | Admitting: Family Practice

## 2019-02-09 ENCOUNTER — Encounter (INDEPENDENT_AMBULATORY_CARE_PROVIDER_SITE_OTHER): Payer: Self-pay | Admitting: Family Practice

## 2019-02-09 DIAGNOSIS — R928 Other abnormal and inconclusive findings on diagnostic imaging of breast: Secondary | ICD-10-CM

## 2019-02-12 ENCOUNTER — Encounter (HOSPITAL_BASED_OUTPATIENT_CLINIC_OR_DEPARTMENT_OTHER): Payer: Self-pay | Admitting: Hospital

## 2019-02-19 ENCOUNTER — Other Ambulatory Visit: Payer: Self-pay

## 2019-02-19 ENCOUNTER — Ambulatory Visit (HOSPITAL_BASED_OUTPATIENT_CLINIC_OR_DEPARTMENT_OTHER): Payer: Self-pay

## 2019-02-19 DIAGNOSIS — Z8781 Personal history of (healed) traumatic fracture: Secondary | ICD-10-CM | POA: Diagnosis not present

## 2019-02-19 DIAGNOSIS — G894 Chronic pain syndrome: Secondary | ICD-10-CM | POA: Diagnosis not present

## 2019-02-19 DIAGNOSIS — Z1389 Encounter for screening for other disorder: Secondary | ICD-10-CM | POA: Diagnosis not present

## 2019-02-19 DIAGNOSIS — M5136 Other intervertebral disc degeneration, lumbar region: Secondary | ICD-10-CM | POA: Diagnosis not present

## 2019-02-19 DIAGNOSIS — M546 Pain in thoracic spine: Secondary | ICD-10-CM | POA: Diagnosis not present

## 2019-02-20 ENCOUNTER — Inpatient Hospital Stay (INDEPENDENT_AMBULATORY_CARE_PROVIDER_SITE_OTHER)
Admit: 2019-02-20 | Discharge: 2019-02-20 | Disposition: A | Discharge status: Home Health | Payer: Medicare Other | Attending: Family Medicine | Admitting: Family Medicine

## 2019-02-20 ENCOUNTER — Encounter (INDEPENDENT_AMBULATORY_CARE_PROVIDER_SITE_OTHER): Payer: Self-pay | Admitting: Family Medicine

## 2019-02-20 ENCOUNTER — Other Ambulatory Visit (HOSPITAL_BASED_OUTPATIENT_CLINIC_OR_DEPARTMENT_OTHER): Payer: Self-pay | Admitting: Family Medicine

## 2019-02-20 DIAGNOSIS — Z9882 Breast implant status: Secondary | ICD-10-CM

## 2019-02-20 DIAGNOSIS — R928 Other abnormal and inconclusive findings on diagnostic imaging of breast: Secondary | ICD-10-CM

## 2019-02-20 DIAGNOSIS — N6041 Mammary duct ectasia of right breast: Secondary | ICD-10-CM

## 2019-03-12 ENCOUNTER — Ambulatory Visit (INDEPENDENT_AMBULATORY_CARE_PROVIDER_SITE_OTHER): Payer: Medicare Other | Admitting: Ophthalmology

## 2019-03-13 ENCOUNTER — Encounter (INDEPENDENT_AMBULATORY_CARE_PROVIDER_SITE_OTHER): Payer: Self-pay | Admitting: Internal Medicine

## 2019-03-13 ENCOUNTER — Telehealth (INDEPENDENT_AMBULATORY_CARE_PROVIDER_SITE_OTHER): Payer: Medicare Other | Admitting: Internal Medicine

## 2019-03-13 DIAGNOSIS — K1120 Sialoadenitis, unspecified: Secondary | ICD-10-CM

## 2019-03-13 DIAGNOSIS — H209 Unspecified iridocyclitis: Secondary | ICD-10-CM

## 2019-03-13 DIAGNOSIS — R490 Dysphonia: Secondary | ICD-10-CM

## 2019-03-13 DIAGNOSIS — Z79899 Other long term (current) drug therapy: Secondary | ICD-10-CM

## 2019-03-13 DIAGNOSIS — R590 Localized enlarged lymph nodes: Secondary | ICD-10-CM

## 2019-03-13 DIAGNOSIS — G35 Multiple sclerosis: Secondary | ICD-10-CM

## 2019-03-13 DIAGNOSIS — D8989 Other specified disorders involving the immune mechanism, not elsewhere classified: Secondary | ICD-10-CM

## 2019-03-13 NOTE — Progress Notes (Signed)
Due to the COVID-19 pandemic and high risk of community-acquired infection with patient travel, I am proceeding with this telemedicine evaluation at the request of the patient.     Patient Verification & Telemedicine Consent:    I am proceeding with this evaluation at the direct request of the patient.  I have verified this is the correct patient and have obtained verbal consent and written consent from the patient/ surrogate to perform this voluntary telemedicine evaluation (including obtaining history, performing examination and reviewing data provided by the patient).   The patient/ surrogate has the right to refuse this evaluation.  I have explained risks (including potential loss of confidentiality), benefits, alternatives, and the potential need for subsequent face to face care. Patient/ surrogate understands that there is a risk of medical inaccuracies given that our recommendations will be made based on reported data (and we must therefore assume this information is accurate).  Knowing that there is a risk that this information is not reported accurately, and that the telemedicine video, audio, or data feed may be incomplete, the patient agrees to proceed with evaluation and holds [place] harmless knowing these risks. In this evaluation, we will be providing recommendations only. The patient/ surrogate has been notified that other healthcare professionals (including students, residents and Metallurgist) may be involved in this audio-video evaluation.   All laws concerning confidentiality and patient access to medical records and copies of medical records apply to telemedicine.  The patient/ surrogate has received the [place] Notice of Privacy Practices.  I have reviewed this above verification and consent paragraph with the patient/ surrogate.  If the patient is not capacitated to understand the above, and no surrogate is available, since this is not an emergency evaluation, the visit will be rescheduled  until such time that the patient can consent, or the surrogate is available to consent.    Demographics:   Medical Record #: [id number]   Date: March 13, 2019   Patient Name: Debra Bolton   DOB: December 27, 1960  Age: 58 year old  Sex: female  Location: Home address on file    Evaluator(s):   Siah Pickney was evaluated by me today.    Clinic Location: [facility]  [address]  [place] [address]    Chief Complaint  Chief Complaint   Patient presents with   . Other   1. Multiple Sclerosis, relapsing form  2. Anterior uveitis  3. IgG related lymphadenopathy, chronic sialoadenitis    History of present Illness  58 year old female with history of multiple sclerosis diagnosed 1991, presented with numbness in both legs, seizures of 4 years duration.  Multiple relapses since then.  Previously treated with Copaxone.  Started Natalizumab in 2014 with good response and improvement in pain and numbness in the extremities.  Currently taking Cymbalta and gabapentin which appear to help with pain in her extremities.    History of anterior uveitis for 1 year, treated with topical corticosteroids.  Seen by Ophthalmology at [facility]  Previous labs from [place] from April 2017 revealed normal SSA, SSB, QuantiFERON TB, cell counts, CMP, inflammatory markers.    She is a chronic heavy smoker, around 2017, developed change in voice with cervical lymphadenopathy.  Underwent removal of enlarged tonsil and cervical lymph node to rule out malignancy.  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  CT chest abdomen pelvis 08/11/2015  which was negative for lymphadenopathy and for retroperitoneal fibrosis.  No pancreatitis.  Neck ultrasound showed bilateral submandibular gland enlargement with heterogeneous echotexture, likely secondary to IgG4 related disease    Switched from natalizumab to rituximab for  multiple sclerosis, in light of history of uveitis and IgG4 related disease, by Neurology.  Received rituximab 500 mg IV 2 doses 2 weeks apart in March 2019 and September 2019  1 g IV single dose June 2020    Remains on rituximab every 6 months. Tolerating rituximab well.  Overall felt better with respect to energy levels, submandibular pain and swelling after the infusion.  Abdominal pain has resolved.      2 week history of recurrence of arthralgias, fatigue, voice hoarseness, submandibular swelling.  Symptoms are fairly mild at this time.  Due to concern for immune suppression in the setting of coronavirus pandemic, prefers to avoid further infusion for now.  mild, gradually progressive.  Denies fever and weight loss.  Notes mild diarrhea, mid lower back pain, usually postprandial.  Denies epigastric pain    Recurrent right subacromial bursitis status post bursa injection on multiple occasions with good response.  Significant tendinopathy on most recent right shoulder MRI.  Shoulder pain stable since last visit    No unexpected appetite or weight changes.  No persistent fever.      Skin: No photosensitivity, rash or ulceration. No skin psoriasis  Hair: No unusual hair loss  Oral: No oral ulceration or sore throat   Eyes:  Per HPI  Ears: No changes in hearing, ear pain or discharge  Abd: Per HPI  Resp: No difficulty breathing, chest pain or chronic cough  Genitourinary: No difficulty or pain with urination  Systemic: No fever, malaise, changes in weight or appetite. No h/o recurrent infections  Neurological:  Per HPI  Extremities: No h/o Raynaud's phenomenon. No other joints with swelling, pain or redness  Vascular:  No swelling around the ankles  Cardiac:  Denies chest pain and palpitations  Psych: No unusual mood changes    Review of Systems  As per HPI    Medications and Allergies were reviewed with the patient.       Current Outpatient Medications:   .  albuterol (VENTOLIN HFA) 108 (90 Base) MCG/ACT inhaler,  Inhale 2 puffs by mouth every 6 hours as needed for Wheezing., Disp: 3 Inhaler, Rfl: 3  .  Amantadine HCl 100 MG tablet, Take 100 mg by mouth 2 times daily., Disp: 60 tablet, Rfl: 0  .  baclofen (LIORESAL) 10 MG tablet, Take 1 tablet (10 mg) by mouth 3 times daily., Disp: 90 tablet, Rfl: 11  .  diazepam (VALIUM) 5 MG tablet, Take 1 tablet (5 mg) by mouth daily as needed for Anxiety., Disp: 7 tablet, Rfl: 0  .  DULoxetine (CYMBALTA) 60 MG CR capsule, Take 2 capsules (120 mg) by mouth daily., Disp: 180 capsule, Rfl: 3  .  gabapentin (NEURONTIN) 300 MG capsule, Take 2 capsules (600 mg) by mouth nightly., Disp: 60 capsule, Rfl: 5  .  meclizine (ANTIVERT) 25 MG tablet, Take 1 tablet (25 mg) by mouth every 8 hours as needed for Dizziness., Disp: 30 tablet, Rfl: 0  .  nortriptyline (PAMELOR) 25 MG capsule, Take 2 capsules (50 mg) by mouth nightly., Disp: 180 capsule, Rfl: 3  .  thyroid (ARMOUR THYROID) 15 MG tablet, Take 1 tablet (15 mg) by mouth every other day., Disp: 45 tablet, Rfl: 2  .  thyroid (ARMOUR THYROID) 60 MG tablet,  Take 1 tablet (60 mg) by mouth daily., Disp: 90 tablet, Rfl: 2  .  tiZANidine (ZANAFLEX) 4 MG tablet, Take 1.5 tablets (6 mg) by mouth at bedtime., Disp: 135 tablet, Rfl: 3    Allergies  Penicillins and Modafinil    Past Medical History:   Diagnosis Date   . Asthma 1991    with pregnancy   . Hypothyroidism    . Major depressive disorder, single episode    . MS (multiple sclerosis) (CMS-HCC)        Past Surgical History:   Procedure Laterality Date   . leep      for cervical dysplasia   . LYMPH NODE BIOPSY      IgG4 related disease   . TONSILLECTOMY AND ADENOIDECTOMY      only tonsillectomy       Family History   Problem Relation Name Age of Onset   . Other Mother Zara Chess    . Thyroid Mother Zara Chess    . Heart Disease Mother Zara Chess         heart failure   . Cancer Father don mc clain         lung   . Cancer Brother tom mcclain    . Heart Disease Brother tom mcclain         heart  attack   . Cancer Sister Zara Chess         breast   . Diabetes Sister suzanne mcclain    . Thyroid Sister suzanne mcclain         car accident   . Stroke Brother [place]    . Cancer Brother Jeremy Johann         prostate cancer   . Cancer Brother john mc clain         copd lungs       Social History     Socioeconomic History   . Marital status: Divorced     Spouse name: Not on file   . Number of children: Not on file   . Years of education: Not on file   . Highest education level: Not on file   Occupational History   . Not on file   Social Needs   . Financial resource strain: Not on file   . Food insecurity     Worry: Not on file     Inability: Not on file   . Transportation needs     Medical: Not on file     Non-medical: Not on file   Tobacco Use   . Smoking status: Former Smoker     Packs/day: 1.00     Years: 15.00     Pack years: 15.00     Types: Cigarettes     Quit date: 01/27/2016     Years since quitting: 3.1   . Smokeless tobacco: Never Used   Substance and Sexual Activity   . Alcohol use: Yes     Alcohol/week: 2.0 standard drinks     Types: 2 Glasses of wine per week   . Drug use: Not on file   . Sexual activity: Yes     Partners: Male     Birth control/protection: Rhythm   Lifestyle   . Physical activity     Days per week: Not on file     Minutes per session: Not on file   . Stress: Not on file   Relationships   . Social connections  Talks on phone: Not on file     Gets together: Not on file     Attends religious service: Not on file     Active member of club or organization: Not on file     Attends meetings of clubs or organizations: Not on file     Relationship status: Not on file   . Intimate partner violence     Fear of current or ex partner: Not on file     Emotionally abused: Not on file     Physically abused: Not on file     Forced sexual activity: Not on file   Other Topics Concern   . Military Service No   . Blood Transfusions No   . Caffeine Concern No   . Occupational Exposure No   .  Hobby Hazards No   . Sleep Concern No   . Stress Concern No   . Weight Concern No   . Special Diet No   . Back Care No   . Exercise Not Asked   . Bike Helmet Not Asked   . Seat Belt Not Asked   . Self-Exams Not Asked   . Exercises Regularly Yes   . Bike Helmet Use Yes   . Seat Belt Use Yes   . Performs Self-Exams Yes   Social History Narrative    Moved from [place] is great, high fiber, she is very conscientious of effect of diet on health.    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    Hasn't had a cigarette in 8 months.    Has smoked 26 years, less than a pack a day.    Currently drinks 2-4 drinks a day on the weekends, total 4-8 per week.    Sexually active with one female partner.    Currently not working, has been on disability for most of the time from 1992 to now.         Physical Exam  There were no vitals filed for this visit.  Gen: Alert, cooperative  Psych: Appropriate affect, pleasant  Neck: No visibly enlarged lymph nodes  Skin: No rheumatologic rashes or ulcers  Eyes: EOMI. No scleral icterus  ENT: No oral mucositis  Resp: Breathing comfortably, no tachypnea  Cardiac: Well perfused extremities, no cyanosis  Joint exam:  No visible joint swelling  Normal fist formation  Good range of motion in shoulder and pelvic girdles and remaining joints  Neuro: Oriented to time, place and person         Most recent labs and imaging were reviewed and discussed with the patient.  50 pages of outside records reviewed, outlined in HPI    08/08/2015  IgG4 level 215 upper limit 129m/dl  Total IgG 1050 upper limit 1590    01/12/17  IgG4 level 193  03/29/17:  IgG4 elevated at 205.  Normal IgG    03/2018 IgG4 163, normal IgG     Most recent labs reviewed with the patient    Impression    ICD-10-CM ICD-9-CM   1. IgG4 related disease (CMS-HCC)  D89.89 279.8   2. Multiple sclerosis (CMS-HCC)  G35 340   3. High risk medication use  Z79.899 V58.69   4. Sialadenitis  K11.20 527.2   5. Lymphadenopathy, cervical  R59.0 785.6   6. Uveitis  of right eye  H20.9 364.3   7. Voice hoarseness  R49.0 784.42       DNovice Vrbais a 58year old female  with multiple sclerosis, followed by Neurology.  Previously well controlled on treatment with natalizumab.    Switched to rituximab in March 2019 due to history of IgG4 related disease, with cervical lymphadenopathy, submandibular gland enlargement and recurrent uveitis of right eye.  Chronic nerve pain well controlled with Cymbalta and gabapentin.    IgG4 related disease on histopathology of enlarged right cervical lymph nodes.  No pancreatitis, no other systemic lymphadenopathy on CT chest abdomen pelvis performed April 2017.    History 2 episodes of anterior uveitis in 2018, no recurrence.  Negative ANA HLA B27, Qft TB    Last rituximab June 2020.  Recurrence of submandibular swelling, voice hoarseness, diarrhea in the last few weeks, likely secondary to IgG4 related disease    PLAN:   repeat immunoglobulin levels   If IgG4 rising or worsening symptoms, consider repeat rituximab 1 g single dose   continue physical therapy for right rotator cuff tendinopathy    Orders Placed This Encounter   Procedures   . CBC w/ Diff Lavender   . Comprehensive Metabolic Panel Green Plasma Separator Tube   . C-Reactive Protein, Blood Green Plasma Separator Tube   . Sedimentation Rate (ESR), Blood Lavender   . IgG Subclass, Blood Yellow serum separator tube   . IgG, Blood - See Instructions        Return in about 2 months (around 05/13/2019).       I spent 40 min for the entire visit including reviewing patient's chart, discussing clinical concerns, completing web-based clinical assessment, reviewing labs/imaging, making clinical decisions, discussing findings and plan with the patient and completing documenation.  Complicated case with multiple diseases, on high-risk medication, managed by multiple specialties.  All of the patient's questions were answered to their satisfaction.

## 2019-03-23 DIAGNOSIS — M5136 Other intervertebral disc degeneration, lumbar region: Secondary | ICD-10-CM | POA: Diagnosis not present

## 2019-03-23 DIAGNOSIS — Z1389 Encounter for screening for other disorder: Secondary | ICD-10-CM | POA: Diagnosis not present

## 2019-03-23 DIAGNOSIS — Z8781 Personal history of (healed) traumatic fracture: Secondary | ICD-10-CM | POA: Diagnosis not present

## 2019-03-23 DIAGNOSIS — G894 Chronic pain syndrome: Secondary | ICD-10-CM | POA: Diagnosis not present

## 2019-03-23 DIAGNOSIS — M546 Pain in thoracic spine: Secondary | ICD-10-CM | POA: Diagnosis not present

## 2019-04-11 ENCOUNTER — Telehealth (HOSPITAL_BASED_OUTPATIENT_CLINIC_OR_DEPARTMENT_OTHER): Payer: Self-pay | Admitting: Neurology

## 2019-04-11 NOTE — Telephone Encounter (Signed)
Pt requesting call back to discuss COVID vaccine, as it relates to being a MS pt.      Kindly requests call back:  P) [phone] Home   Ok to lvm

## 2019-04-16 ENCOUNTER — Encounter (INDEPENDENT_AMBULATORY_CARE_PROVIDER_SITE_OTHER): Payer: Self-pay | Admitting: Neurology

## 2019-04-16 NOTE — Telephone Encounter (Signed)
I have sent the following MyChart message to the patient:    Dear Debra Bolton,  We have received your call regarding the SARS-CoV-2 vaccine. We recommend the vaccine to our MS patients and I would recommend that you get it once it becomes available to you. A common reaction to the vaccine is fever so we would recommend that you take scheduled tylenol 650mg  every 6 hours for the first 1-2 days following the vaccination.   Best wishes,  Hulda Humphrey, MD  Neuroimmunology / Multiple Sclerosis Fellow, PGY5  04/16/2019 8:08 PM

## 2019-04-17 DIAGNOSIS — F5101 Primary insomnia: Secondary | ICD-10-CM | POA: Diagnosis not present

## 2019-04-17 DIAGNOSIS — F17219 Nicotine dependence, cigarettes, with unspecified nicotine-induced disorders: Secondary | ICD-10-CM | POA: Diagnosis not present

## 2019-04-17 DIAGNOSIS — E440 Moderate protein-calorie malnutrition: Secondary | ICD-10-CM | POA: Diagnosis not present

## 2019-04-17 DIAGNOSIS — Z1159 Encounter for screening for other viral diseases: Secondary | ICD-10-CM | POA: Diagnosis not present

## 2019-04-17 DIAGNOSIS — J449 Chronic obstructive pulmonary disease, unspecified: Secondary | ICD-10-CM | POA: Diagnosis not present

## 2019-04-17 DIAGNOSIS — I2510 Atherosclerotic heart disease of native coronary artery without angina pectoris: Secondary | ICD-10-CM | POA: Diagnosis not present

## 2019-04-17 DIAGNOSIS — F321 Major depressive disorder, single episode, moderate: Secondary | ICD-10-CM | POA: Diagnosis not present

## 2019-04-17 DIAGNOSIS — Z681 Body mass index (BMI) 19 or less, adult: Secondary | ICD-10-CM | POA: Diagnosis not present

## 2019-04-17 DIAGNOSIS — E782 Mixed hyperlipidemia: Secondary | ICD-10-CM | POA: Diagnosis not present

## 2019-04-17 DIAGNOSIS — I10 Essential (primary) hypertension: Secondary | ICD-10-CM | POA: Diagnosis not present

## 2019-04-18 ENCOUNTER — Encounter (INDEPENDENT_AMBULATORY_CARE_PROVIDER_SITE_OTHER): Payer: Self-pay | Admitting: Family Practice

## 2019-04-18 NOTE — Telephone Encounter (Signed)
From: [name]  To: Christin Bach, MD  Sent: 04/18/2019 11:54 AM PST  Subject: 1-Non Urgent Medical Advice    Hi Dr. Rana Snare hope you are well.     I am contacting you to find out about the vaccine, how I get set up to receive it, and if perhaps because of the MS, I can get it sooner then most. I have been isolating like others, almost completely. I would so love to get out of this isolation. I just don't know how this all works.  Thanks for helping me with this!   [name]

## 2019-04-19 DIAGNOSIS — G894 Chronic pain syndrome: Secondary | ICD-10-CM | POA: Diagnosis not present

## 2019-04-19 DIAGNOSIS — Z8781 Personal history of (healed) traumatic fracture: Secondary | ICD-10-CM | POA: Diagnosis not present

## 2019-04-19 DIAGNOSIS — Z1389 Encounter for screening for other disorder: Secondary | ICD-10-CM | POA: Diagnosis not present

## 2019-04-19 DIAGNOSIS — M5136 Other intervertebral disc degeneration, lumbar region: Secondary | ICD-10-CM | POA: Diagnosis not present

## 2019-04-19 DIAGNOSIS — M546 Pain in thoracic spine: Secondary | ICD-10-CM | POA: Diagnosis not present

## 2019-04-28 ENCOUNTER — Encounter: Payer: Self-pay | Admitting: Hospital

## 2019-05-01 DIAGNOSIS — H43813 Vitreous degeneration, bilateral: Secondary | ICD-10-CM | POA: Diagnosis not present

## 2019-05-01 DIAGNOSIS — H524 Presbyopia: Secondary | ICD-10-CM | POA: Diagnosis not present

## 2019-05-01 DIAGNOSIS — H52223 Regular astigmatism, bilateral: Secondary | ICD-10-CM | POA: Diagnosis not present

## 2019-05-01 DIAGNOSIS — H25813 Combined forms of age-related cataract, bilateral: Secondary | ICD-10-CM | POA: Diagnosis not present

## 2019-05-01 DIAGNOSIS — H5203 Hypermetropia, bilateral: Secondary | ICD-10-CM | POA: Diagnosis not present

## 2019-05-03 ENCOUNTER — Encounter: Payer: Self-pay | Admitting: Hospital

## 2019-05-07 ENCOUNTER — Other Ambulatory Visit (INDEPENDENT_AMBULATORY_CARE_PROVIDER_SITE_OTHER): Payer: Self-pay | Admitting: Family Practice

## 2019-05-07 DIAGNOSIS — E039 Hypothyroidism, unspecified: Secondary | ICD-10-CM

## 2019-05-08 ENCOUNTER — Other Ambulatory Visit (INDEPENDENT_AMBULATORY_CARE_PROVIDER_SITE_OTHER): Payer: Medicare Other | Attending: Internal Medicine

## 2019-05-08 ENCOUNTER — Other Ambulatory Visit (HOSPITAL_BASED_OUTPATIENT_CLINIC_OR_DEPARTMENT_OTHER): Payer: Medicare Other

## 2019-05-08 DIAGNOSIS — D8989 Other specified disorders involving the immune mechanism, not elsewhere classified: Secondary | ICD-10-CM | POA: Insufficient documentation

## 2019-05-08 LAB — IGG, BLOOD: IGG: 975 mg/dL (ref 700–1600)

## 2019-05-08 LAB — CBC WITH DIFF, BLOOD
ANC-Automated: 2.6 10*3/uL (ref 1.6–7.0)
Abs Basophils: 0 10*3/uL
Abs Eosinophils: 0.3 10*3/uL (ref 0.0–0.5)
Abs Lymphs: 1.9 10*3/uL (ref 0.8–3.1)
Abs Monos: 0.5 10*3/uL (ref 0.2–0.8)
Basophils: 1 %
Eosinophils: 5 %
Hct: 37.8 % (ref 34.0–45.0)
Hgb: 13 gm/dL (ref 11.2–15.7)
Lymphocytes: 36 %
MCH: 31 pg (ref 26.0–32.0)
MCHC: 34.4 g/dL (ref 32.0–36.0)
MCV: 90.2 um3 (ref 79.0–95.0)
MPV: 9.8 fL (ref 9.4–12.4)
Monocytes: 10 %
Plt Count: 245 10*3/uL (ref 140–370)
RBC: 4.19 10*6/uL (ref 3.90–5.20)
RDW: 11.8 % — ABNORMAL LOW (ref 12.0–14.0)
Segs: 48 %
WBC: 5.3 10*3/uL (ref 4.0–10.0)

## 2019-05-08 LAB — COMPREHENSIVE METABOLIC PANEL, BLOOD
ALT (SGPT): 17 U/L (ref 0–33)
AST (SGOT): 19 U/L (ref 0–32)
Albumin: 4.6 g/dL (ref 3.5–5.2)
Alkaline Phos: 57 U/L (ref 35–140)
Anion Gap: 11 mmol/L (ref 7–15)
BUN: 13 mg/dL (ref 6–20)
Bicarbonate: 27 mmol/L (ref 22–29)
Bilirubin, Tot: 0.31 mg/dL (ref <1.2)
Calcium: 10 mg/dL (ref 8.5–10.6)
Chloride: 104 mmol/L (ref 98–107)
Creatinine: 0.88 mg/dL (ref 0.51–0.95)
GFR: >60 mL/min
Glucose: 88 mg/dL (ref 70–99)
Potassium: 4.2 mmol/L (ref 3.5–5.1)
Sodium: 142 mmol/L (ref 136–145)
Total Protein: 7.3 g/dL (ref 6.0–8.0)

## 2019-05-08 LAB — C-REACTIVE PROTEIN, BLOOD: CRP: <0.03 mg/dL (ref <0.5)

## 2019-05-08 LAB — SED RATE, BLOOD: Sed Rate: 7 mm/hr (ref 0–30)

## 2019-05-09 MED ORDER — THYROID 60 MG OR TABS
60.00 mg | ORAL_TABLET | Freq: Every day | ORAL | 2 refills | Status: AC
Start: 2019-05-09 — End: ?

## 2019-05-09 MED ORDER — THYROID 15 MG OR TABS
15.00 mg | ORAL_TABLET | ORAL | 2 refills | Status: AC
Start: 2019-05-09 — End: ?

## 2019-05-09 NOTE — Telephone Encounter (Signed)
Debra Bolton, CPhT  ([facility])      Hypothyroid Medication Refill Protocol    Last visit: 11/10/2018 Vertigo  Next f/u appt due: Return in about 6 weeks (around 12/22/2018).   Next scheduled appointment: Visit date not found    Per OV notes from 11/10/2018:  S: Hypothyroidism- feeling ok, does feel like her sex drive is pretty low  In future may want to see women's health about HRT to see if helps with libido  A/P: Hypothyroidism, unspecified type  TSH, Blood - See Instructions  Free Thyroxine, Blood Green Plasma Separator Tube      **If no recent dose changes, and last TSH (and T4 for Liothyronine only) normal/within date, no recent notes will be reviewed per Pharmacy Dept protocol**      LABS required:  (Q year TSH)  *If recent dose change: check TSH 2 months after starting new  dose.    Lab Results   Component Value Date    TSH 0.51 02/08/2019    FREET4 0.86 (L) 02/08/2019       Monitoring required:  (Q year BP, HR, Wt)   *If pt is pregnant, DEFER TO MD*    (BP range: 0000000  [id number])  Blood Pressure   11/10/18 134/84   10/16/18 117/82   09/18/18 116/74       (HR range: 55-110)   Pulse Readings from Last 3 Encounters:   11/10/18 91   10/16/18 68   09/18/18 74       Wt Readings from Last 3 Encounters:   11/10/18 60.3 kg (132 lb 15 oz)   10/16/18 60.6 kg (133 lb 9.6 oz)   09/18/18 60.7 kg (133 lb 14.4 oz)             Hypothyroid Medication Refill Protocol      **If no recent dose changes, and last TSH (and T4 for Liothyronine only) normal/within date, no recent notes will be reviewed per Pharmacy Dept protocol**      LABS required:  (Q year TSH)  *If recent dose change: check TSH 2 months after starting new  dose.    Lab Results   Component Value Date    TSH 0.51 02/08/2019    FREET4 0.86 (L) 02/08/2019       Monitoring required:  (Q year BP, HR, Wt)   *If pt is pregnant, DEFER TO MD*    (BP range: 0000000  [id number])  Blood Pressure   11/10/18 134/84   10/16/18  117/82   09/18/18 116/74       (HR range: 55-110)   Pulse Readings from Last 3 Encounters:   11/10/18 91   10/16/18 68   09/18/18 74       Wt Readings from Last 3 Encounters:   11/10/18 60.3 kg (132 lb 15 oz)   10/16/18 60.6 kg (133 lb 9.6 oz)   09/18/18 60.7 kg (133 lb 14.4 oz)

## 2019-05-10 DIAGNOSIS — I6523 Occlusion and stenosis of bilateral carotid arteries: Secondary | ICD-10-CM | POA: Diagnosis not present

## 2019-05-10 DIAGNOSIS — I10 Essential (primary) hypertension: Secondary | ICD-10-CM | POA: Diagnosis not present

## 2019-05-10 DIAGNOSIS — E785 Hyperlipidemia, unspecified: Secondary | ICD-10-CM | POA: Diagnosis not present

## 2019-05-10 DIAGNOSIS — F17200 Nicotine dependence, unspecified, uncomplicated: Secondary | ICD-10-CM | POA: Diagnosis not present

## 2019-05-10 LAB — IGG SUBCLASS PANEL, BLOOD
IGG Subclass 1: 504 mg/dL (ref 240–1118)
IGG Subclass 2: 355 mg/dL (ref 124–549)
IGG Subclass 3: 15 mg/dL — ABNORMAL LOW (ref 21–134)
IGG Subclass 4: 135 mg/dL — ABNORMAL HIGH (ref 1–123)

## 2019-05-14 ENCOUNTER — Ambulatory Visit (INDEPENDENT_AMBULATORY_CARE_PROVIDER_SITE_OTHER): Payer: Self-pay | Admitting: Family Practice

## 2019-05-14 ENCOUNTER — Encounter (INDEPENDENT_AMBULATORY_CARE_PROVIDER_SITE_OTHER): Payer: Self-pay | Admitting: Internal Medicine

## 2019-05-14 ENCOUNTER — Telehealth (INDEPENDENT_AMBULATORY_CARE_PROVIDER_SITE_OTHER): Payer: Medicare Other | Admitting: Internal Medicine

## 2019-05-14 DIAGNOSIS — H209 Unspecified iridocyclitis: Secondary | ICD-10-CM

## 2019-05-14 DIAGNOSIS — K1120 Sialoadenitis, unspecified: Secondary | ICD-10-CM

## 2019-05-14 DIAGNOSIS — R590 Localized enlarged lymph nodes: Secondary | ICD-10-CM

## 2019-05-14 DIAGNOSIS — R1013 Epigastric pain: Secondary | ICD-10-CM

## 2019-05-14 DIAGNOSIS — Z79899 Other long term (current) drug therapy: Secondary | ICD-10-CM

## 2019-05-14 DIAGNOSIS — D8989 Other specified disorders involving the immune mechanism, not elsewhere classified: Secondary | ICD-10-CM

## 2019-05-14 DIAGNOSIS — G35 Multiple sclerosis: Secondary | ICD-10-CM

## 2019-05-14 NOTE — Progress Notes (Signed)
Due to the COVID-19 pandemic and high risk of community-acquired infection with patient travel, I am proceeding with this telemedicine evaluation at the request of the patient.     Patient Verification & Telemedicine Consent:    I am proceeding with this evaluation at the direct request of the patient.  I have verified this is the correct patient and have obtained verbal consent and written consent from the patient/ surrogate to perform this voluntary telemedicine evaluation (including obtaining history, performing examination and reviewing data provided by the patient).   The patient/ surrogate has the right to refuse this evaluation.  I have explained risks (including potential loss of confidentiality), benefits, alternatives, and the potential need for subsequent face to face care. Patient/ surrogate understands that there is a risk of medical inaccuracies given that our recommendations will be made based on reported data (and we must therefore assume this information is accurate).  Knowing that there is a risk that this information is not reported accurately, and that the telemedicine video, audio, or data feed may be incomplete, the patient agrees to proceed with evaluation and holds [place] harmless knowing these risks. In this evaluation, we will be providing recommendations only. The patient/ surrogate has been notified that other healthcare professionals (including students, residents and Metallurgist) may be involved in this audio-video evaluation.   All laws concerning confidentiality and patient access to medical records and copies of medical records apply to telemedicine.  The patient/ surrogate has received the [place] Notice of Privacy Practices.  I have reviewed this above verification and consent paragraph with the patient/ surrogate.  If the patient is not capacitated to understand the above, and no surrogate is available, since this is not an emergency evaluation, the visit will be rescheduled  until such time that the patient can consent, or the surrogate is available to consent.    Demographics:   Medical Record #: [id number]   Date: May 14, 2019   Patient Name: Debra Bolton   DOB: Nov 12, 1960  Age: 59 year old  Sex: female  Location: Home address on file    Evaluator(s):   Gentri Couvillon was evaluated by me today.    Clinic Location: [facility]  [address]  [place] [address]    Chief Complaint  Chief Complaint   Patient presents with   . Follow Up   1. Multiple Sclerosis, relapsing form  2. Anterior uveitis  3. IgG related lymphadenopathy, chronic sialoadenitis    History of present Illness  59 year old female with history of multiple sclerosis diagnosed 1991, presented with numbness in both legs, seizures of 4 years duration.  Multiple relapses since then.  Previously treated with Copaxone.  Started Natalizumab in 2014 with good response and improvement in pain and numbness in the extremities.  Currently taking Cymbalta and gabapentin which appear to help with pain in her extremities.    History of anterior uveitis for 1 year, treated with topical corticosteroids.  Seen by Ophthalmology at [facility].  Previous labs from [place] from April 2017 revealed normal SSA, SSB, QuantiFERON TB, cell counts, CMP, inflammatory markers.    She is a chronic heavy smoker, around 2017, developed change in voice with cervical lymphadenopathy.  Underwent removal of enlarged tonsil and cervical lymph node to rule out malignancy.  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  CT chest abdomen pelvis  08/11/2015 which was negative for lymphadenopathy and for retroperitoneal fibrosis.  No pancreatitis.  Neck ultrasound showed bilateral submandibular gland enlargement with heterogeneous echotexture, likely secondary to IgG4 related disease    Switched from natalizumab to rituximab for  multiple sclerosis, in light of history of uveitis and IgG4 related disease, by Neurology.  Received rituximab 500 mg IV 2 doses 2 weeks apart in March 2019 and September 2019  1 g IV single dose June 2020    Remains on rituximab every 6 months. Tolerating rituximab well.  Overall felt better with respect to energy levels, submandibular pain and swelling after the infusion.  Abdominal pain has resolved.      2 month history of recurrence of arthralgias, fatigue, voice hoarseness, submandibular swelling.  Symptoms are fairly mild at this time.  No significant progression since last visit. Due to concern for immune suppression in the setting of coronavirus pandemic, prefers to avoid further infusion for now.  Denies fever and weight loss.  Notes mild diarrhea, mid lower back pain, usually postprandial.  Mild, intermittent epigastric pain    Recurrent right subacromial bursitis status post bursa injection on multiple occasions with good response.  Significant tendinopathy on most recent right shoulder MRI.  Shoulder pain stable since last visit    No unexpected appetite or weight changes.  No persistent fever.      Skin: No photosensitivity, rash or ulceration. No skin psoriasis  Hair: No unusual hair loss  Oral: No oral ulceration or sore throat   Eyes:  Per HPI  Ears: No changes in hearing, ear pain or discharge  Abd: Per HPI  Resp: No difficulty breathing, chest pain or chronic cough  Genitourinary: No difficulty or pain with urination  Systemic: No fever, malaise, changes in weight or appetite. No h/o recurrent infections  Neurological:  Per HPI  Extremities: No h/o Raynaud's phenomenon. No other joints with swelling, pain or redness  Vascular:  No swelling around the ankles  Cardiac:  Denies chest pain and palpitations  Psych: No unusual mood changes    Review of Systems  As per HPI    Medications and Allergies were reviewed with the patient.       Current Outpatient Medications:   .  albuterol (VENTOLIN HFA) 108  (90 Base) MCG/ACT inhaler, Inhale 2 puffs by mouth every 6 hours as needed for Wheezing., Disp: 3 Inhaler, Rfl: 3  .  Amantadine HCl 100 MG tablet, Take 100 mg by mouth 2 times daily., Disp: 60 tablet, Rfl: 0  .  baclofen (LIORESAL) 10 MG tablet, Take 1 tablet (10 mg) by mouth 3 times daily., Disp: 90 tablet, Rfl: 11  .  diazepam (VALIUM) 5 MG tablet, Take 1 tablet (5 mg) by mouth daily as needed for Anxiety., Disp: 7 tablet, Rfl: 0  .  DULoxetine (CYMBALTA) 60 MG CR capsule, Take 2 capsules (120 mg) by mouth daily., Disp: 180 capsule, Rfl: 3  .  gabapentin (NEURONTIN) 300 MG capsule, Take 2 capsules (600 mg) by mouth nightly., Disp: 60 capsule, Rfl: 5  .  meclizine (ANTIVERT) 25 MG tablet, Take 1 tablet (25 mg) by mouth every 8 hours as needed for Dizziness., Disp: 30 tablet, Rfl: 0  .  nortriptyline (PAMELOR) 25 MG capsule, Take 2 capsules (50 mg) by mouth nightly., Disp: 180 capsule, Rfl: 3  .  thyroid (ARMOUR THYROID) 15 MG tablet, Take 1 tablet (15 mg) by mouth every other day., Disp: 45 tablet, Rfl: 2  .  thyroid (ARMOUR  THYROID) 60 MG tablet, Take 1 tablet (60 mg) by mouth daily., Disp: 90 tablet, Rfl: 2  .  tiZANidine (ZANAFLEX) 4 MG tablet, Take 1.5 tablets (6 mg) by mouth at bedtime., Disp: 135 tablet, Rfl: 3    Allergies  Penicillins and Modafinil    Past Medical History:   Diagnosis Date   . Asthma 1991    with pregnancy   . Hypothyroidism    . Major depressive disorder, single episode    . MS (multiple sclerosis) (CMS-HCC)        Past Surgical History:   Procedure Laterality Date   . leep      for cervical dysplasia   . LYMPH NODE BIOPSY      IgG4 related disease   . TONSILLECTOMY AND ADENOIDECTOMY      only tonsillectomy       Family History   Problem Relation Name Age of Onset   . Other Mother Zara Chess    . Thyroid Mother Zara Chess    . Heart Disease Mother Zara Chess         heart failure   . Cancer Father don mc clain         lung   . Cancer Brother tom mcclain    . Heart Disease Brother  tom mcclain         heart attack   . Cancer Sister Zara Chess         breast   . Diabetes Sister suzanne mcclain    . Thyroid Sister suzanne mcclain         car accident   . Stroke Brother [place]    . Cancer Brother Jeremy Johann         prostate cancer   . Cancer Brother john mc clain         copd lungs       Social History     Socioeconomic History   . Marital status: Divorced     Spouse name: Not on file   . Number of children: Not on file   . Years of education: Not on file   . Highest education level: Not on file   Occupational History   . Not on file   Social Needs   . Financial resource strain: Not on file   . Food insecurity     Worry: Not on file     Inability: Not on file   . Transportation needs     Medical: Not on file     Non-medical: Not on file   Tobacco Use   . Smoking status: Former Smoker     Packs/day: 1.00     Years: 15.00     Pack years: 15.00     Types: Cigarettes     Quit date: 01/27/2016     Years since quitting: 3.2   . Smokeless tobacco: Never Used   Substance and Sexual Activity   . Alcohol use: Yes     Alcohol/week: 2.0 standard drinks     Types: 2 Glasses of wine per week   . Drug use: Not on file   . Sexual activity: Yes     Partners: Male     Birth control/protection: Rhythm   Lifestyle   . Physical activity     Days per week: Not on file     Minutes per session: Not on file   . Stress: Not on file   Relationships   . Social connections  Talks on phone: Not on file     Gets together: Not on file     Attends religious service: Not on file     Active member of club or organization: Not on file     Attends meetings of clubs or organizations: Not on file     Relationship status: Not on file   . Intimate partner violence     Fear of current or ex partner: Not on file     Emotionally abused: Not on file     Physically abused: Not on file     Forced sexual activity: Not on file   Other Topics Concern   . Military Service No   . Blood Transfusions No   . Caffeine Concern No   .  Occupational Exposure No   . Hobby Hazards No   . Sleep Concern No   . Stress Concern No   . Weight Concern No   . Special Diet No   . Back Care No   . Exercise Not Asked   . Bike Helmet Not Asked   . Seat Belt Not Asked   . Self-Exams Not Asked   . Exercises Regularly Yes   . Bike Helmet Use Yes   . Seat Belt Use Yes   . Performs Self-Exams Yes   Social History Narrative    Moved from [place] is great, high fiber, she is very conscientious of effect of diet on health.    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    Hasn't had a cigarette in 8 months.    Has smoked 26 years, less than a pack a day.    Currently drinks 2-4 drinks a day on the weekends, total 4-8 per week.    Sexually active with one female partner.    Currently not working, has been on disability for most of the time from 1992 to now.         Physical Exam  There were no vitals filed for this visit.  Gen: Alert, cooperative  Psych: Appropriate affect, pleasant  Neck: No visibly enlarged lymph nodes  Skin: No rheumatologic rashes or ulcers  Eyes: EOMI. No scleral icterus  ENT: No oral mucositis  Resp: Breathing comfortably, no tachypnea  Cardiac: Well perfused extremities, no cyanosis  Joint exam:  No visible joint swelling  Normal fist formation  Good range of motion in shoulder and pelvic girdles and remaining joints  Neuro: Oriented to time, place and person         Most recent labs and imaging were reviewed and discussed with the patient.  50 pages of outside records reviewed, outlined in HPI    08/08/2015  IgG4 level 215 upper limit 175m/dl  Total IgG 1050 upper limit 1590    01/12/17  IgG4 level 193  03/29/17:  IgG4 elevated at 205.  Normal IgG    03/2018 IgG4 163, normal IgG     Most recent labs reviewed with the patient  IgG4 135    Impression    ICD-10-CM ICD-9-CM   1. IgG4 related disease (CMS-HCC)  D89.89 279.8   2. Multiple sclerosis (CMS-HCC)  G35 340   3. High risk medication use  Z79.899 V58.69   4. Sialadenitis  K11.20 527.2   5.  Lymphadenopathy, cervical  R59.0 785.6   6. Uveitis of right eye  H20.9 364.3   7. Epigastric pain  R10.13 789.06       Taiylor KScadutois a 59  year old female with multiple sclerosis, followed by Neurology.  Previously well controlled on treatment with natalizumab.    Switched to rituximab in March 2019 due to history of IgG4 related disease, with cervical lymphadenopathy, submandibular gland enlargement and recurrent uveitis of right eye.  Chronic nerve pain well controlled with Cymbalta and gabapentin.    IgG4 related disease on histopathology of enlarged right cervical lymph nodes.  No pancreatitis, no other systemic lymphadenopathy on CT chest abdomen pelvis performed April 2017.    History 2 episodes of anterior uveitis in 2018, no recurrence.  Negative ANA HLA B27, Qft TB    Last rituximab June 2020. Persistently elevated but overall improved IgG4 levels, minimal symptomatology at this time, continue to monitor, consider repeat infusion 3 months    PLAN:   repeat immunoglobulin levels every month   If IgG4 rising or worsening symptoms, consider repeat rituximab 1 g single dose   continue physical therapy for right rotator cuff tendinopathy    Orders Placed This Encounter   Procedures   . IgG Subclass, Blood Yellow serum separator tube   . IgG, Blood - See Instructions   . Sedimentation Rate (ESR), Blood Lavender   . C-Reactive Protein, Blood Green Plasma Separator Tube   . CBC w/ Diff Lavender   . CMP        Return in about 10 weeks (around 07/23/2019).       I spent 40 min for the entire visit including reviewing patient's chart, discussing clinical concerns, completing web-based clinical assessment, reviewing labs/imaging, making clinical decisions, discussing findings and plan with the patient and completing documenation.  Complicated case with multiple diseases, on high-risk medication, managed by multiple specialties.  All of the patient's questions were answered to their satisfaction.

## 2019-05-14 NOTE — Telephone Encounter (Signed)
Forwarded to nurse triage

## 2019-05-14 NOTE — Telephone Encounter (Signed)
PROVIDER ACTION REQUESTED: NO, FYI Only  Action item needed:  no  Appt scheduled:   Future Appointments   Date Time Provider [place]   05/23/2019 10:40 AM Edi, Verl Blalock, MD [facility]   06/01/2019  1:00 PM Arelia Sneddon, MD [facility]   07/23/2019  2:00 PM Johny Shears, MD [facility]   07/25/2019  9:40 AM Edi, Verl Blalock, MD [facility]       Chief Complaint     Dizziness x few weeks, lightheaded. Has to get up slowly.   Left sided middle,  x several weeks, intermittent. Urine problems- states she has retention issues d/t MS, states last few months urine has been darker with a stronger odor.      CLINIC/RN/LVN ACTION REQUEST: NO, FYI Only  Action item needed: NO, FYI Only    Acute appt scheduled and pt given strict ED precautions and reviewed all applicable Care Advice per protocol.     2020 FLU VACCINE: NO   COVID TRIAGE PROTOCOL: NEGATIVE       Reason for Call: Dizziness and Abdominal Pain    Disposition: See PCP Within 2 Weeks     COVID-19 Assessment and Triage  Updated 01/26/2019    Please note:  High risk category no longer required for testing.  Please continue to document which category they are in for risk stratification of results.    Use as part of cough, fever, URI or shortness of breath triage:  Do you have a fever OR a new cough or shortness of breath (SOB) no  or  Other symptoms seen in COVID 19 patients include NEW headache, chills with or without repeated shaking, chest tightness, anosmia (loss of smell), ageuisa (loss of taste), sore throat, diarrhea, severe, muscle pain, unexplained fatigue or syncope (fainting), nausea, vomiting, congestion or runny nose. no      Does patient belong to one of the following categories (not required for testing)?  [] High risk includes age>65, active smoker, chronic lung disease, chronic heart/live/kidney disease, diabetes, immunosuppression, active cancer   [] Any resident of or provider working in a senior living facility, including  skilled nursing facilities or assisted living facilities.  [] Contact to known COVID-19 cases.  [] Health care worker or first responder or frontline worker like delivery or grocery  [] Persons who care for the elderly.  [] Persons experiencing homelessness.  [] Travel in the last 14 days?    [] Close contact with anyone in above above categories?   Pregnant?    No    If NO to symptoms then triage as usual.    IF YES to symptoms evaluate for disposition as per below:    Emergency Department  . Patient has difficulty breathing, sounds very sick or weak to triager, has underlying respiratory or cardiac disease (e.g., asthma, COPD, heart failure)  . If YES, ask patient to be seen in nearest [facility] emergency department  . Ask the patient to request a mask as soon as they enter the building and remind them to wash their hands or use alcohol gel.  . Please alert the location by calling that the patient will be arriving and give them a time estimate for their arrival. Please place ED referral also as per usual.    Urgent Care:   . Patients who have high risk for co-morbidity will need evaluation by a provider.  High risk factors include: age over 59, active smoker, chronic lung / heart / liver / renal disease,  immunosuppressed status, or active cancer.  If YES, refer patient to Urgent Care.  . Patient with reported high fever (100.4 F if measured).  If YES, refer patient Urgent Care.  . Ask the patient to call urgent care staff on arrival, and request a mask as soon as they enter the building and remind them to wash their hands or use alcohol gel.  . Please place [facility] referral also as per usual.  Only need to alert the location by calling that the patient will be arriving and give them a time estimate for their arrival if [facility] is full.       Schedule drive up screening (For Primary care triage nurse only, for specialty areas, refer patient to primary care provider)  . Indicated for patients with low risk of clinical  deterioration and do not have any high risk factors for severe illness.  . Schedule patient to:  [place], appt = 2281, provider =  [place] [id number]  [place], appt = 2281, provider = [place] [id number]  [place], appt = 2281, provider = [place] [id number]  [place], appt = 2281, provider = [place] [id number]  [place], appt = 2281, provider = [place] [id number]  . Pend COVID order panel and route to provider for approval    Reason for Disposition  . Dizziness is a chronic symptom (recurrent or ongoing AND present > 4 weeks)  . Abdominal pain is a chronic symptom (recurrent or ongoing AND present > 4 weeks)    Additional Information  . Negative: Severe difficulty breathing (e.g., struggling for each breath, speaks in single words)  . Negative: [1] Difficulty breathing or swallowing AND [2] started suddenly after medicine, an allergic food or bee sting  . Negative: Shock suspected (e.g., cold/pale/clammy skin, too weak to stand, low BP, rapid pulse)  . Negative: Difficult to awaken or acting confused (e.g., disoriented, slurred speech)  . Negative: [1] Weakness (i.e., paralysis, loss of muscle strength) of the face, arm or leg on one side of the body AND [2] sudden onset AND [3] present now  . Negative: [1] Numbness (i.e., loss of sensation) of the face, arm or leg on one side of the body AND [2] sudden onset AND [3] present now  . Negative: [1] Loss of speech or garbled speech AND [2] sudden onset AND [3] present now  . Negative: Overdose (accidental or intentional) of medications  . Negative: [1] Fainted > 15 minutes ago AND [2] still feels too weak or dizzy to stand  . Negative: Heart beating < 50 beats per minute OR > 140 beats per minute  . Negative: Sounds like a life-threatening emergency to the triager  . Negative: Chest pain  . Negative: Rectal bleeding, bloody stool, or tarry-black stool  . Negative: [1]  Vomiting AND [2] contains red blood or black ("coffee ground") material  . Negative: Vomiting is main symptom  . Negative: Diarrhea is main symptom  . Negative: Headache is main symptom  . Negative: Patient states that he/she is having an anxiety/panic attack  . Negative: Dizziness from low blood sugar (i.e., < 60 mg/dl or 3.5 mmol/l)  . Negative: Dizziness is described as a spinning sensation (i.e., vertigo)  . Negative: Heat exhaustion suspected (i.e., dehydration from heat exposure)  . Negative: Difficulty breathing  . Negative: [1] Dizziness caused by heat exposure, sudden standing, or poor fluid intake AND [2] no improvement after 2  hours of rest and fluids  . Negative: [1] Fever > 103 F (39.4 C) AND [2] not able to get the fever down using Fever Care Advice  . Negative: [1] Fever > 101 F (38.3 C) AND [2] age > 34  . Negative: [1] Fever > 100.0 F (37.8 C) AND [2] bedridden (e.g., nursing home patient, CVA, chronic illness, recovering from surgery)  . Negative: [1] Fever > 100.0 F (37.8 C) AND [2] diabetes mellitus or weak immune system (e.g., HIV positive, cancer chemo, splenectomy, organ transplant, chronic steroids)  . Negative: SEVERE dizziness (e.g., unable to stand, requires support to walk, feels like passing out now)  . Negative: Extra heart beats OR irregular heart beating  (i.e., "palpitations")  . Negative: [1] Drinking very little AND [2] dehydration suspected (e.g., no urine > 12 hours, very dry mouth, very lightheaded)  . Negative: Patient sounds very sick or weak to the triager  . Negative: [1] MODERATE dizziness (e.g., interferes with normal activities) AND [2] has been evaluated by physician for this  . Negative: [1] MILD dizziness (e.g., walking normally) AND [2] has NOT been evaluated by physician for this  (Exception: dizziness caused by heat exposure, sudden standing, or poor fluid intake)  . Negative: Alcohol or drug abuse, known or suspected  . Negative: [1] MODERATE dizziness (e.g.,  interferes with normal activities) AND [2] has NOT been evaluated by physician for this  (Exception: dizziness caused by heat exposure, sudden standing, or poor fluid intake)  . Negative: Fever present > 3 days (72 hours)  . Negative: Taking a medicine that could cause dizziness (e.g., blood pressure medications, diuretics)  . Negative: Shock suspected (e.g., cold/pale/clammy skin, too weak to stand, low BP, rapid pulse)  . Negative: Difficult to awaken or acting confused (e.g., disoriented, slurred speech)  . Negative: Passed out (i.e., lost consciousness, collapsed and was not responding)  . Negative: Sounds like a life-threatening emergency to the triager  . Negative: Chest pain  . Negative: Pain is mainly in upper abdomen  (if needed ask: "is it mainly above the belly button?")  . Negative: Followed an abdomen (stomach) injury  . Negative: [1] Abdominal pain AND [2] pregnant < 20 weeks  . Negative: [1] Abdominal pain AND [2] pregnant > 20 weeks  . Negative: [1] Abdominal pain AND [2] postpartum (from 0 to 6 weeks after delivery)  . Negative: [1] SEVERE pain (e.g., excruciating) AND [2] present > 1 hour  . Negative: [1] SEVERE pain AND [2] age > 43  . Negative: [1] Vomiting AND [2] contains red blood or black ("coffee ground") material  (Exception: few red streaks in vomit that only happened once)  . Negative: Blood in bowel movements   (Exception: blood on surface of BM with constipation)  . Negative: Black or tarry bowel movements  (Exception: chronic-unchanged  black-grey bowel movements AND is taking iron pills or Pepto-bismol)  . Negative: Patient sounds very sick or weak to the triager  . Negative: [1] MODERATE pain (e.g., interferes with normal activities) AND [2] pain comes and goes (cramps) AND [3] present > 24 hours  (Exception: pain with Vomiting or Diarrhea - see that Guideline)  . Negative: [1] MILD pain (e.g., does not interfere with normal activities) AND [2] pain comes and goes (cramps) AND [3]  present > 48 hours  . Negative: Age > 60 years  . Negative: Pregnancy suspected (e.g., missed last menstrual period)  . Negative: Unusual vaginal discharge (e.g., bad smelling, yellow, green, or  foamy-white)  . Negative: Blood in urine (red, pink, or tea-colored)  . Negative: [1] SEVERE pain AND [2] present < 1 hour    Answer Assessment - Initial Assessment Questions  1. DESCRIPTION: "Describe your dizziness."      Dizziness x few weeks, lightheaded. Has to get up slowly.     2. LIGHTHEADED: "Do you feel lightheaded?" (e.g., somewhat faint, woozy, weak upon standing)      Yes   3. VERTIGO: "Do you feel like either you or the room is spinning or tilting?" (i.e. vertigo)      Had vertigo a few months ago, these sx are different.  4. SEVERITY: "How bad is it?"  "Do you feel like you are going to faint?" "Can you stand and walk?"    - MILD - walking normally    - MODERATE - interferes with normal activities (e.g., work, school)     - SEVERE - unable to stand, requires support to walk, feels like passing out now.       mild  5. ONSET:  "When did the dizziness begin?"      A few weeks  6. AGGRAVATING FACTORS: "Does anything make it worse?" (e.g., standing, change in head position)      With movement  7. HEART RATE: "Can you tell me your heart rate?" "How many beats in 15 seconds?"  (Note: not all patients can do this)        no  8. CAUSE: "What do you think is causing the dizziness?"      unk  9. RECURRENT SYMPTOM: "Have you had dizziness before?" If so, ask: "When was the last time?" "What happened that time?"      no  10. OTHER SYMPTOMS: feels like she has had 2 panic attacks in the last few months, will feel pulse, it will "feel like a flutter in my throat" Denies: fever, chest pain, vomiting, diarrhea, bleeding    Answer Assessment - Initial Assessment Questions  1. LOCATION: "Where does it hurt?"       Left sided middle,   2. RADIATION: "Does the pain shoot anywhere else?" (e.g., chest, back)      A little to  back  3. ONSET: "When did the pain begin?" (e.g., minutes, hours or days ago)       A few weeks  4. SUDDEN: "Gradual or sudden onset?"      gradual  5. PATTERN "Does the pain come and go, or is it constant?"     - If constant: "Is it getting better, staying the same, or worsening?"       (Note: Constant means the pain never goes away completely; most serious pain is constant and it progresses)      - If intermittent: "How long does it last?" "Do you have pain now?"      (Note: Intermittent means the pain goes away completely between bouts)      Intermittent,   6. SEVERITY: "How bad is the pain?"  (e.g., Scale 1-10; mild, moderate, or severe)    - MILD (1-3): doesn't interfere with normal activities, abdomen soft and not tender to touch     - MODERATE (4-7): interferes with normal activities or awakens from sleep, tender to touch     - SEVERE (8-10): excruciating pain, doubled over, unable to do any normal activities       mild  7. RECURRENT SYMPTOM: "Have you ever had this type of abdominal pain before?" If so, ask: "  When was the last time?" and "What happened that time?"       no  8. CAUSE: "What do you think is causing the abdominal pain?"      unk  9. RELIEVING/AGGRAVATING FACTORS: "What makes it better or worse?" (e.g., movement, antacids, bowel movement)      no  10. OTHER SYMPTOMS: sometimes nausea. Urine problems- states she has retention issues d/t MS, states last few months urine has been darker with a stronger odor. Denies:  vomiting, diarrhea, constipation, dysuria, hematuria, flank pain.    Protocols used: DIZZINESS - LIGHTHEADEDNESS-A-AH, ABDOMINAL PAIN - FEMALE-A-AH

## 2019-05-14 NOTE — Telephone Encounter (Signed)
From: Debra Bolton  To: Christin Bach, MD  Sent: 05/14/2019 2:05 PM PST  Subject: 1-Non Urgent Medical Advice    Dear Dr. Rana Snare,   I tried to make an appt for early Feb. (looks like first available was April)  I have been having some issues, dizziness, stomach aches, darker then normal urine, and an odd odor. I had a blood test for Rheumatology on the 19th. So some labs are current.  I just thought it was worth letting you know what is going on, in case it is cause for concern.  Please let me know if I need to be seen sooner.  Thanks  Debra Bolton

## 2019-05-17 DIAGNOSIS — Z1389 Encounter for screening for other disorder: Secondary | ICD-10-CM | POA: Diagnosis not present

## 2019-05-17 DIAGNOSIS — G894 Chronic pain syndrome: Secondary | ICD-10-CM | POA: Diagnosis not present

## 2019-05-17 DIAGNOSIS — M546 Pain in thoracic spine: Secondary | ICD-10-CM | POA: Diagnosis not present

## 2019-05-17 DIAGNOSIS — Z8781 Personal history of (healed) traumatic fracture: Secondary | ICD-10-CM | POA: Diagnosis not present

## 2019-05-17 DIAGNOSIS — M5136 Other intervertebral disc degeneration, lumbar region: Secondary | ICD-10-CM | POA: Diagnosis not present

## 2019-05-23 ENCOUNTER — Encounter (INDEPENDENT_AMBULATORY_CARE_PROVIDER_SITE_OTHER): Payer: Self-pay | Admitting: Family Practice

## 2019-05-23 ENCOUNTER — Other Ambulatory Visit: Payer: Medicare Other | Attending: Family Practice

## 2019-05-23 ENCOUNTER — Ambulatory Visit (INDEPENDENT_AMBULATORY_CARE_PROVIDER_SITE_OTHER): Payer: Medicare Other | Admitting: Family Practice

## 2019-05-23 ENCOUNTER — Other Ambulatory Visit (INDEPENDENT_AMBULATORY_CARE_PROVIDER_SITE_OTHER): Payer: Self-pay | Admitting: Family Practice

## 2019-05-23 VITALS — BP 128/79 | HR 82 | Temp 97.8°F | Resp 19 | Ht 64.0 in | Wt 135.0 lb

## 2019-05-23 DIAGNOSIS — Z8249 Family history of ischemic heart disease and other diseases of the circulatory system: Secondary | ICD-10-CM

## 2019-05-23 DIAGNOSIS — R3989 Other symptoms and signs involving the genitourinary system: Secondary | ICD-10-CM | POA: Insufficient documentation

## 2019-05-23 DIAGNOSIS — R399 Unspecified symptoms and signs involving the genitourinary system: Secondary | ICD-10-CM

## 2019-05-23 DIAGNOSIS — R002 Palpitations: Secondary | ICD-10-CM

## 2019-05-23 DIAGNOSIS — K036 Deposits [accretions] on teeth: Secondary | ICD-10-CM | POA: Insufficient documentation

## 2019-05-23 DIAGNOSIS — K921 Melena: Secondary | ICD-10-CM

## 2019-05-23 DIAGNOSIS — R079 Chest pain, unspecified: Secondary | ICD-10-CM

## 2019-05-23 DIAGNOSIS — R42 Dizziness and giddiness: Secondary | ICD-10-CM

## 2019-05-23 DIAGNOSIS — K029 Dental caries, unspecified: Secondary | ICD-10-CM | POA: Insufficient documentation

## 2019-05-23 DIAGNOSIS — R109 Unspecified abdominal pain: Secondary | ICD-10-CM | POA: Insufficient documentation

## 2019-05-23 DIAGNOSIS — R55 Syncope and collapse: Secondary | ICD-10-CM

## 2019-05-23 LAB — CBC WITH DIFF, BLOOD
ANC-Automated: 3.2 10*3/uL (ref 1.6–7.0)
Abs Basophils: 0 10*3/uL
Abs Eosinophils: 0.2 10*3/uL (ref 0.0–0.5)
Abs Lymphs: 2.4 10*3/uL (ref 0.8–3.1)
Abs Monos: 0.5 10*3/uL (ref 0.2–0.8)
Basophils: 1 %
Eosinophils: 4 %
Hct: 37.5 % (ref 34.0–45.0)
Hgb: 12.2 gm/dL (ref 11.2–15.7)
Lymphocytes: 37 %
MCH: 29.5 pg (ref 26.0–32.0)
MCHC: 32.5 g/dL (ref 32.0–36.0)
MCV: 90.6 um3 (ref 79.0–95.0)
MPV: 9.9 fL (ref 9.4–12.4)
Monocytes: 8 %
Plt Count: 259 10*3/uL (ref 140–370)
RBC: 4.14 10*6/uL (ref 3.90–5.20)
RDW: 11.5 % — ABNORMAL LOW (ref 12.0–14.0)
Segs: 50 %
WBC: 6.5 10*3/uL (ref 4.0–10.0)

## 2019-05-23 LAB — COMPREHENSIVE METABOLIC PANEL, BLOOD
ALT (SGPT): 16 U/L (ref 0–33)
AST (SGOT): 22 U/L (ref 0–32)
Albumin: 4.5 g/dL (ref 3.5–5.2)
Alkaline Phos: 58 U/L (ref 35–140)
Anion Gap: 13 mmol/L (ref 7–15)
BUN: 11 mg/dL (ref 6–20)
Bicarbonate: 25 mmol/L (ref 22–29)
Bilirubin, Tot: 0.29 mg/dL (ref <1.2)
Calcium: 9.4 mg/dL (ref 8.5–10.6)
Chloride: 101 mmol/L (ref 98–107)
Creatinine: 0.85 mg/dL (ref 0.51–0.95)
GFR: >60 mL/min
Glucose: 66 mg/dL — ABNORMAL LOW (ref 70–99)
Potassium: 3.9 mmol/L (ref 3.5–5.1)
Sodium: 139 mmol/L (ref 136–145)
Total Protein: 7.1 g/dL (ref 6.0–8.0)

## 2019-05-23 LAB — UA, CHEM ONLY POCT
Bilirubin: NEGATIVE
Blood: NEGATIVE
Glucose: NEGATIVE
Ketones: NEGATIVE
Nitrite: NEGATIVE
Protein: NEGATIVE
Specific Gravity: 1.015 (ref 1.002–1.030)
Urobilinogen: 0.2 (ref 0.2–1.0)
pH: 5.5 (ref 5.0–8.0)

## 2019-05-23 LAB — URINALYSIS
Bilirubin: NEGATIVE
Blood: NEGATIVE
Glucose: NEGATIVE
Ketones: NEGATIVE
Leuk Esterase: NEGATIVE Leu/uL
Nitrite: NEGATIVE
Protein: NEGATIVE
Specific Gravity: 1.008 (ref 1.002–1.030)
Urobilinogen: NEGATIVE
pH: 6.5 (ref 5.0–8.0)

## 2019-05-23 LAB — CPK-CREATINE PHOSPHOKINASE, BLOOD: CPK: 216 U/L — ABNORMAL HIGH (ref 0–175)

## 2019-05-23 LAB — TSH, BLOOD: TSH: 0.79 u[IU]/mL (ref 0.27–4.20)

## 2019-05-23 MED ORDER — NITROFURANTOIN MONOHYD MACRO 100 MG OR CAPS
100.0000 mg | ORAL_CAPSULE | Freq: Two times a day (BID) | ORAL | 0 refills | Status: DC
Start: 2019-05-23 — End: 2019-06-01

## 2019-05-23 NOTE — Patient Instructions (Addendum)
Please see cardiology  Please get your colonoscopy  Please get your labs done  Please get your stool test done for Hpylori  If your labs do not show a cause for your abdominal pain then I will place a referral to GI  If you have an episode where you pass out please go to the ER  Take care!

## 2019-05-23 NOTE — Interdisciplinary (Signed)
Only doing orders for Dr. Rana Snare as per patient request  Blood drawn from right arm with 21 gauge needle. 2 tubes taken.   Patient identity authenticated by Kenton Kingfisher.

## 2019-05-23 NOTE — Progress Notes (Signed)
SUBJECTIVE:  Debra Bolton is a 59 year old female here for abdominal pain, dizziness      Urinary changes- has noted that her urine is darker and has odor  No pain with urination  Has some issues with bladder due to MS, spasticity, sees urology at [place]  No fever  Has some tea colored urine- started over last month and half  Not a lot of muscle pain    Chest fluttering- feels like heart is racing, not sure if related to anxiety or panic attacks  No recent changes, mostly staying at home  Has been trying to be healthy, doing yoga, stopped alcohol use    She is drinking a lot of water      Bright red blood noted after bowel movement twice, noticed when wiping, does have history of hemorrhoids  When she wipes there would be blood after having bowel movement just happened yesterday  Colonoscopy done about 8 years ago had at [facility] she might have issue with anesthesia, had issues with waking up under anesthesia  No pain with bowel movements  She eats a lot of fiber  Has about 5 bowel movements her day  No straining      No change in supplements    She has been doing yoga five days per week    Abdominal pain-   She has bad taste in her mouth and it seems like saliva comes up, no acid coming up, no reflux  She has a little bit of bloating  No vomiting  Pain located left lower abdomen, seems to be after eating, eats about 5 times per day, will feel nauseous and her stomach will hurt every night, no fever, no diarrhea, pain in abdomen feels like "queasy in abdomen"        Presyncopal symptoms- when she stands up feels like she is going to pass out, no syncope, will get nauseous with it    Chest pain- pain left side of her chest and that is when she gets a flutter in chest  She has her fitbit on, her heart rate does not change with it    Family history heart disease- brother had MI and died in 71's  No chest pain when she is exercising  No palpitations when she is exercising      Review of Systems -    See HPI    HISTORY:  Patient Active Problem List   Diagnosis   . Multiple sclerosis (CMS-HCC)   . IgG4 related disease (CMS-HCC)   . Uveitis of right eye   . 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 Mood disorder (CMS-HCC)   . PTSD (post-traumatic stress disorder)   . Adequate exercise, at or above goal   . Rotator cuff tear   . Dental caries, unspecified   . Deposits (accretions) on teeth     Past Medical History:   Diagnosis Date   . Asthma 1991    with pregnancy   . Hypothyroidism    . Major depressive disorder, single episode    . MS (multiple sclerosis) (CMS-HCC)      Past Surgical History:   Procedure Laterality Date   . leep      for cervical dysplasia   . LYMPH NODE BIOPSY      IgG4 related disease   . TONSILLECTOMY AND ADENOIDECTOMY      only tonsillectomy       Current Outpatient Medications on File  Prior to Visit   Medication Sig Dispense Refill   . albuterol (VENTOLIN HFA) 108 (90 Base) MCG/ACT inhaler Inhale 2 puffs by mouth every 6 hours as needed for Wheezing. 3 Inhaler 3   . Amantadine HCl 100 MG tablet Take 100 mg by mouth 2 times daily. 60 tablet 0   . baclofen (LIORESAL) 10 MG tablet Take 1 tablet (10 mg) by mouth 3 times daily. 90 tablet 11   . diazepam (VALIUM) 5 MG tablet Take 1 tablet (5 mg) by mouth daily as needed for Anxiety. 7 tablet 0   . DULoxetine (CYMBALTA) 60 MG CR capsule Take 2 capsules (120 mg) by mouth daily. 180 capsule 3   . gabapentin (NEURONTIN) 300 MG capsule Take 2 capsules (600 mg) by mouth nightly. 60 capsule 5   . meclizine (ANTIVERT) 25 MG tablet Take 1 tablet (25 mg) by mouth every 8 hours as needed for Dizziness. 30 tablet 0   . nortriptyline (PAMELOR) 25 MG capsule Take 2 capsules (50 mg) by mouth nightly. 180 capsule 3   . thyroid (ARMOUR THYROID) 15 MG tablet Take 1 tablet (15 mg) by mouth every other day. 45 tablet 2   . thyroid (ARMOUR THYROID) 60 MG tablet Take 1 tablet (60 mg) by mouth daily. 90 tablet 2   . tiZANidine (ZANAFLEX) 4 MG tablet  Take 1.5 tablets (6 mg) by mouth at bedtime. 135 tablet 3     No current facility-administered medications on file prior to visit.      Allergies   Allergen Reactions   . Penicillins Rash   . Modafinil Diarrhea and Nausea and Vomiting     Family History   Problem Relation Name Age of Onset   . Other Mother Zara Chess    . Thyroid Mother Zara Chess    . Heart Disease Mother Zara Chess         heart failure   . Cancer Father don mc clain         lung   . Cancer Brother tom mcclain    . Heart Disease Brother tom mcclain         heart attack   . Cancer Sister Zara Chess         breast   . Diabetes Sister suzanne mcclain    . Thyroid Sister suzanne mcclain         car accident   . Stroke Brother [place]    . Cancer Brother Jeremy Johann         prostate cancer   . Cancer Brother john mc clain         copd lungs     Social History     Socioeconomic History   . Marital status: Divorced     Spouse name: Not on file   . Number of children: Not on file   . Years of education: Not on file   . Highest education level: Not on file   Occupational History   . Not on file   Social Needs   . Financial resource strain: Not on file   . Food insecurity     Worry: Not on file     Inability: Not on file   . Transportation needs     Medical: Not on file     Non-medical: Not on file   Tobacco Use   . Smoking status: Former Smoker     Packs/day: 1.00     Years: 15.00  Pack years: 15.00     Types: Cigarettes     Quit date: 01/27/2016     Years since quitting: 3.3   . Smokeless tobacco: Never Used   Substance and Sexual Activity   . Alcohol use: Yes     Alcohol/week: 2.0 standard drinks     Types: 2 Glasses of wine per week   . Drug use: Not on file   . Sexual activity: Yes     Partners: Male     Birth control/protection: Rhythm   Lifestyle   . Physical activity     Days per week: Not on file     Minutes per session: Not on file   . Stress: Not on file   Relationships   . Social Product manager on phone: Not on file      Gets together: Not on file     Attends religious service: Not on file     Active member of club or organization: Not on file     Attends meetings of clubs or organizations: Not on file     Relationship status: Not on file   . Intimate partner violence     Fear of current or ex partner: Not on file     Emotionally abused: Not on file     Physically abused: Not on file     Forced sexual activity: Not on file   Other Topics Concern   . Military Service No   . Blood Transfusions No   . Caffeine Concern No   . Occupational Exposure No   . Hobby Hazards No   . Sleep Concern No   . Stress Concern No   . Weight Concern No   . Special Diet No   . Back Care No   . Exercise Not Asked   . Bike Helmet Not Asked   . Seat Belt Not Asked   . Self-Exams Not Asked   . Exercises Regularly Yes   . Bike Helmet Use Yes   . Seat Belt Use Yes   . Performs Self-Exams Yes   Social History Narrative    Moved from [place] is great, high fiber, she is very conscientious of effect of diet on health.    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    Hasn't had a cigarette in 8 months.    Has smoked 26 years, less than a pack a day.    Currently drinks 2-4 drinks a day on the weekends, total 4-8 per week.    Sexually active with one female partner.    Currently not working, has been on disability for most of the time from 1992 to now.       OBJECTIVE:    BP 128/79   Pulse 82   Temp 97.8 F (36.6 C) (Oral)   Resp 19   Ht 5\' 4"  (1.626 m)   Wt 61.3 kg (135 lb 0.5 oz)   SpO2 99%   BMI 23.18 kg/m     Body mass index is 23.18 kg/m.  Wt Readings from Last 5 Encounters:   05/23/19 61.3 kg (135 lb 0.5 oz)   11/10/18 60.3 kg (132 lb 15 oz)   10/16/18 60.6 kg (133 lb 9.6 oz)   09/18/18 60.7 kg (133 lb 14.4 oz)   06/12/18 59.9 kg (132 lb 0.9 oz)     Blood Pressure   05/23/19 128/79   11/10/18 134/84   10/16/18 117/82  09/18/18 116/74   06/13/18 123/79       General: WDWN, NAD, answers appropriately  Heart: RRR, no MRG  Lungs: normal effort, CTAB, no wheezes,  rhonchi, crackles  Abd: Bs+, soft, slight tenderness to palpation suprapubic area and right lower quadrant with no rebound or guarding noted, no masses, no organomegaly.   Back: no CVA tenderness bilaterally  Psych: alert and oriented x 3, appropriate affect    Studies:  Results for DARLYNN, VELIZ (MRN [id number]) as of 05/23/2019 15:40   Ref. Range 05/23/2019 10:32   Color Latest Ref Range: Yellow  Yellow   Appearance Latest Ref Range: Clear  Clear   Specific Gravity Latest Ref Range: 1.002 - 1.030  1.015   pH Latest Ref Range: 5.0 - 8.0  5.5   Protein Latest Ref Range: Negative  Negative   Glucose Latest Ref Range: Negative  Negative   Ketones Latest Ref Range: Negative  Negative   Leuk Esterase Latest Ref Range: Negative  1+ (A)   Nitrite Latest Ref Range: Negative  Negative   Bilirubin Latest Ref Range: Negative  Negative   Blood Latest Ref Range: Negative  Negative   Urobilinogen Latest Ref Range: 0.2 - 1.0  0.2      EKG NSR, normal axis, northam rhythem, nsr, no st changes    ASSESMENT AND PLAN:       ICD-10-CM ICD-9-CM    1. Urinary symptom or sign  R39.9 788.99 UA, Chem Only (POCT)   UA in office with leuks, will start on macrobid for UTI and f/u urine ctx results, she is going to follow up with Urology, may have some neurogenic bladder symptoms from MS    Urinalysis      Urine Culture - See Instructions      nitrofurantoin monohydrate (MACROBID) 100 MG capsule   2. Chest pain, unspecified type - no chest pain with exercise, EKG in office reassuring, will refer to cardiology given presyncopal feelings, we reviewed ER precautions R07.9 786.50 ECG, In [facility]   3. Palpitation - EKG normal in office, will get zio patch test, refer to cardiology and will check labs R00.2 785.1 ECG, In Clinic      Cardiology Clinic      TSH, Blood - See Instructions      CBC w/ Diff Lavender      Event Monitor   4. Abdominal pain, unspecified abdominal location - will check Hpylori antigen, and check CMP, possible  UTI may be contributing to pain R10.9 789.00 Comprehensive Metabolic Panel (Expected today and later)      H. Pylori Antigen   5. Family history of heart attack  Z82.49 V17.3 Cardiology Clinic   6. Symptom of blood in stool  K92.1 578.1 GI Endoscopy Procedure Service Request (Non-GI Use Only)   7. Urine discoloration  R39.89 791.9 CPK, Blood Green Plasma Separator Tube   8. Postural dizziness with presyncope  R42 780.4 Event Monitor    R55 780.2    I spent 30 minutes face-to-face with the patient today, not including separately reportable services/procedures.  Greater than 50% spent in discussion of work up for chest fluttering, urinary symptoms, presyncope, abdominal pain and review of possible diagnosis and testing needed, additionally review of EKG done in office.  Patient questions were answered satisfactorily.  Patient expressed understanding of the above    F/U: Return in about 4 weeks (around 06/20/2019) for 4-6 weeks f/u symptoms.     Medication  Management:  Medications reviewed with patient and medication list reconciled.  Over the counter medications, herbal therapies and supplements reviewed.  Patient's understanding and response to medications assessed.    Barriers to medications assessed and addressed.  Risks, benefits, alternatives to medications reviewed.    Randa Ngo, MD  Department of Family Medicine and [place], [facility]  HS Assistant Clinical Professor, [employer]

## 2019-05-24 ENCOUNTER — Telehealth (HOSPITAL_COMMUNITY): Payer: Self-pay

## 2019-05-24 NOTE — Telephone Encounter (Signed)
WQ      Left voicemail for patient to schedule from referral - Deferred    If patient calls back, schedule new cardiology appt

## 2019-05-25 ENCOUNTER — Other Ambulatory Visit: Payer: Self-pay

## 2019-05-25 LAB — URINE CULTURE

## 2019-05-30 ENCOUNTER — Encounter (INDEPENDENT_AMBULATORY_CARE_PROVIDER_SITE_OTHER): Payer: Self-pay | Admitting: Family Practice

## 2019-05-30 DIAGNOSIS — R748 Abnormal levels of other serum enzymes: Secondary | ICD-10-CM

## 2019-05-30 DIAGNOSIS — R7309 Other abnormal glucose: Secondary | ICD-10-CM

## 2019-05-31 ENCOUNTER — Encounter (INDEPENDENT_AMBULATORY_CARE_PROVIDER_SITE_OTHER): Payer: Medicare Other | Admitting: Interventional Cardiology

## 2019-05-31 DIAGNOSIS — G894 Chronic pain syndrome: Secondary | ICD-10-CM | POA: Diagnosis not present

## 2019-05-31 DIAGNOSIS — M5136 Other intervertebral disc degeneration, lumbar region: Secondary | ICD-10-CM | POA: Diagnosis not present

## 2019-05-31 DIAGNOSIS — M546 Pain in thoracic spine: Secondary | ICD-10-CM | POA: Diagnosis not present

## 2019-05-31 DIAGNOSIS — Z1389 Encounter for screening for other disorder: Secondary | ICD-10-CM | POA: Diagnosis not present

## 2019-05-31 DIAGNOSIS — Z8781 Personal history of (healed) traumatic fracture: Secondary | ICD-10-CM | POA: Diagnosis not present

## 2019-06-01 ENCOUNTER — Telehealth (HOSPITAL_BASED_OUTPATIENT_CLINIC_OR_DEPARTMENT_OTHER): Payer: Self-pay | Admitting: Neurology

## 2019-06-01 ENCOUNTER — Ambulatory Visit: Payer: Medicare Other | Attending: Neurology | Admitting: Neurology

## 2019-06-01 ENCOUNTER — Encounter (HOSPITAL_BASED_OUTPATIENT_CLINIC_OR_DEPARTMENT_OTHER): Payer: Self-pay | Admitting: Neurology

## 2019-06-01 DIAGNOSIS — G894 Chronic pain syndrome: Secondary | ICD-10-CM

## 2019-06-01 DIAGNOSIS — F339 Major depressive disorder, recurrent, unspecified: Secondary | ICD-10-CM | POA: Insufficient documentation

## 2019-06-01 DIAGNOSIS — D8989 Other specified disorders involving the immune mechanism, not elsewhere classified: Secondary | ICD-10-CM | POA: Insufficient documentation

## 2019-06-01 DIAGNOSIS — M797 Fibromyalgia: Secondary | ICD-10-CM | POA: Insufficient documentation

## 2019-06-01 DIAGNOSIS — G35 Multiple sclerosis: Secondary | ICD-10-CM | POA: Insufficient documentation

## 2019-06-01 MED ORDER — NORTRIPTYLINE HCL 25 MG OR CAPS
50.0000 mg | ORAL_CAPSULE | Freq: Every evening | ORAL | 3 refills | Status: DC
Start: 2019-06-01 — End: 2020-04-02

## 2019-06-01 MED ORDER — DULOXETINE HCL 60 MG OR CPEP
120.0000 mg | ORAL_CAPSULE | Freq: Every day | ORAL | 3 refills | Status: DC
Start: 2019-06-01 — End: 2020-04-02

## 2019-06-01 MED ORDER — GABAPENTIN 300 MG OR CAPS
600.0000 mg | ORAL_CAPSULE | Freq: Every evening | ORAL | 5 refills | Status: DC
Start: 2019-06-01 — End: 2020-04-02

## 2019-06-01 NOTE — Telephone Encounter (Signed)
I placed a call 30 minutes prior to the patients telemedicine appointment.  Confirmed patient has eChecked in, and is ready for their visit with the provider.  Appointment arrived.     Patient would like to be called at this number   [phone]

## 2019-06-01 NOTE — Patient Instructions (Signed)
-   lab work ([facility] [employer] and naive panel (send out to [facility] using test code [phone]), immunoglobulin panel) at next blood draw, once resulted we will discuss with Dr. Candiss Norse time point of next Rituximab infusion  - avoid alcohol to not impact your balance given correlation to falls  - please let us know if imbalance worsens, in that case we can try physical therapy  - please let us know if urinary hesitancy worsens or you get another UTI  - follow-up in 6 months (in-person if pandemic permits)      Please contact us via [place] or by phone if you experience any worsening of your symptoms or for any other concerns or questions that come up in the meantime.    Please find below contact information for our Neuroimmunology team:    Please also be aware, since we are with patients in clinic all day, response times may be affected.    For scheduling, changing, or cancelling appointments, please call [phone] or [phone]    For immune medication questions, please contact or Pharmacist, Dr. Bosie Clos via [place] or [phone]    For new symptoms, clinical questions, or other nursing related concerns, please contact:       Prudencio Pair, RN for Dr. Berenice Primas via MyChart or [phone]       Sheran Lawless, RN for Dr. Rosendo Gros via [place] or [phone]    If unable to reach our team directly you may call the Call Center to get a message to our team at [phone]     Medical records and imaging can be dropped off at one of our clinic locations:  [place], [address], [address], [place], [place] [address]  [place] - [phone] [place], [place], [place], [place] [address]    The Department of Neurology's fax number for medical records is [phone]  For Dr. Berenice Primas, medical records can be faxed to [phone]  For Dr. Rosendo Gros, medical records can be faxed to  [phone]    Below I have included useful phone numbers:    MRI/Imaging scheduling: [phone]  [place] PT/OT: [phone]   Urology: [phone]  [place]: [phone]  MyChart Service Desk: [phone]      For blood work: You can get this done at one of our clinic locations. Alternatively, visit [email or web address] for a full list of  lab locations and hours. Please note that some locations require appointments and cannot accommodate walk-in patients. Please call [phone] to schedule appointments.    Resources for support groups and patient education materials can be found at  [email or web address]     MS Disease Modifying Therapies (DMT)  [email or web address]    Wellness in [place]  [email or web address]    To speak with one of our Research Coordinators with questions pertaining to research studies, please contact [place] or [employer] at [phone]

## 2019-06-01 NOTE — Progress Notes (Signed)
---------------------(data below generated by Revere Kristen Loader, MD, Hulda Humphrey, MD)--------------------    Patient Verification & Telemedicine Consent:    I am proceeding with this evaluation at the direct request of the patient.  I have verified this is the correct patient and have obtained verbal consent and written consent from the patient/ surrogate to perform this voluntary telemedicine evaluation (including obtaining history, performing examination and reviewing data provided by the patient).   The patient/ surrogate has the right to refuse this evaluation.  I have explained risks (including potential loss of confidentiality), benefits, alternatives, and the potential need for subsequent face to face care. Patient/ surrogate understands that there is a risk of medical inaccuracies given that our recommendations will be made based on reported data (and we must therefore assume this information is accurate).  Knowing that there is a risk that this information is not reported accurately, and that the telemedicine video, audio, or data feed may be incomplete, the patient agrees to proceed with evaluation and holds [place] harmless knowing these risks. In this evaluation, we will be providing recommendations only.  The ultimate decision to follow, or not follow, these recommendations will be left to the bedside treating/ requesting practitioner.  The patient/ surrogate has been notified that other healthcare professionals (including students, residents and Metallurgist) may be involved in this audio-video evaluation.   All laws concerning confidentiality and patient access to medical records and copies of medical records apply to telemedicine.  The patient/ surrogate has received the [place] Notice of Privacy Practices.  I have reviewed this above verification and consent paragraph with the patient/ surrogate.  If the patient is not capacitated to understand the above, and no surrogate is available, since this  is not an emergency evaluation, the visit will be rescheduled until such time that the patient can consent, or the surrogate is available to consent.    Demographics:   Medical Record #: [id number]   Date: June 01, 2019   Patient Name: Debra Bolton   DOB: 1960-08-23  Age: 59 year old  Sex: female  Location: Home address on file      Evaluator(s):   Zen Stigall was evaluated by me today.    Clinic Location:  [facility]  [place] [address]    Justification for Telehealth Service:  Due to the COVID-19 Pandemic and a federally declared state of public health emergency, this service is being conducted via video visit.     [facility]    First visit: 12/09/2015     Last visit: 11/24/2018 Current visit: 06/01/2019      Principle Neurological Diagnosis:   1. Clinically definite Multiple Sclerosis, relapsing form  2. Anterior uveitis  3. IgG4 related lymphadenopathy  4. Vestibular neuronitis    Identifying information: Debra Bolton is a 59 year old woman living in [place] for the past 2 years, but originally from [place], seen for 27 years by Dr. Lavon Paganini at [facility] [place]. 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 We reviewed the history and problem list in detail today which is documented in the following sections.     Her problem list was reviewed and updated below:  _____________________________________________________________________    MS Problem List:   **1. Mobility (ND;range  0-6):2 notices more falls related to imbalance, continues to be unrestricted in her walking, continues to hike. Teaches yoga classes on zoom.  2. Hand Function (ND;range 0-5):right hand clumsiness  3. Vision (ND; range 0-5): Decreased vision OD>OS  **4. Fatigue ( ND; range 0-5):  provigil 200 mg in morning prn makes her nausea and shakey so uses it sparingly; switched to amantadine which she is taking prn  5. Cognition ( ND; range 0-5):episodes of word finding difficulties within past several years  6. Bowel/Bladder ( ND; range 0-5): rare bowel incontinence starting 10 years ago, infrequent urinary retention and hesitancy, has noticed worsening problems with voiding, UTI in Feb 2021;    7. Sensory ( ND; range 0-5): bilateral leg tingling and pain (right > left), slightly increasing arm tingling (right >>left)  8. Spasticity ( ND; range 0-5): bilateral legs, improved with yoga and now graduating as an Art therapist  **9. Pain ( ND; range 0-5): lower extremity tingling and burning pain; usually very manageable but worsened during UTI, currently on 600 mg qhs of gabapetin, cymbalta 120 mg qhs, nortriptyline 55m qhs    MS Performance Scale Score (sum of problem list scores 1-9):  ND  (result ranges from 0 (normal) to 46 (maximal interference of symptoms on life))    10. Affective: (BDI II:  ND) longstanding history of depression  11. Social Support: lives with boyfriend, 3 daughters [place] Other symptoms or problems:  vestibular neuronitis with residual positional vertigo  ______________________________________________________________________    MS History      Initial Symptoms (Date and Description): 1987 (~age 48), after delivery of first daughter, both legs would fall asleep during exercise  1988 grand mal seizure in doctor office  1989 [place], was placed on tegretol for 2 years  1991 diagnosed with MS as she had more frequent episodes of fatigue, depression and numbness     Progressive Disease (approx onset and description): see above    Cumulative [place]its (since 05/26/2018)    -Visit 05/26/2018: Reports 2 falls recently feels she is tripping and catching her  feet (R>L); concerned that her balance is more off lately. Feels her legs are "jittery" or have subtle tremors/shakes; sees black spots in the periphery of R eye constantly; She tolerated the RItuximab well (last infusion on 01/16/2018); Initially noticed improvement in hoarse voice (related to swollen cervical LNs from IgG4 syndrome) but now returning;Dr LTruman Haywardfelt uveitis OD better on 09/09/17 but she feels inflammation is returning. Dr LTruman Haywardis waiting several months to make sure uveitis resolved before doing cataract surgery; Continues to have mouth ulcers continue to occur intermittently (sore raised white spot without bleeding but painful).     -Telephone visit 11/24/2018: Acute onset severe vertigo 10/05/2018 when she got up to go the bathroom. Persisted with nausea and ataxia ; bedridden for 2 days before vertigo became intermittent and positional; still occurring but 80 % improved. MRI brain 10/19/2018 with no new enlarging or active lesions. Brainstem looks normal  Reports amantadine helps fatigue but takes prn because she get too jittery with regular usage  Discussed risks of rituximab with COVID 19. Somewhat of a moot point since she received an infusion last on 09/18/2018. [place] Discussed precautions    06/01/2019 telephone follow-up: In the last year she has fallen  5 times including a recent fall: She went to the bathroom and fell forward reaching to the toilet paper. L head purple, CT Head / Face without fractures or ICH. She has noticed an association with alcohol use and will try to avoid this. 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 Recent UTI and endorses urinary hesitancy but not interested in starting medication yet.        Disease Modifying Treatment History: [Documented Needle phobia: No]    1. Betaseron (~ 1 year on therapy)  2. Copaxone ( <1 year) discontinued because  it worsened depression  3. Tysabri (~0347 - Sep 11, 2015)  4. Rituximab 500 mg X 2 doses (07/01/17 & 07/19/17), 1000 mg X 2 (01/02/18 & 01/16/18), 1000 mg X 1 (09/19/2018)    ______________________________________________________________________    Review of Systems: Complete 10 point review of symptoms from the patient self report questionnaire was reviewed with the patient today and scanned into the media tab. Any pertinent positives are listed above in the narrative history or problem list.    Allergies   Allergen Reactions   . Penicillins Rash   . Modafinil Diarrhea and Nausea and Vomiting       Current Outpatient Medications   Medication Sig   . albuterol (VENTOLIN HFA) 108 (90 Base) MCG/ACT inhaler Inhale 2 puffs by mouth every 6 hours as needed for Wheezing.   . Amantadine HCl 100 MG tablet Take 100 mg by mouth 2 times daily.   . baclofen (LIORESAL) 10 MG tablet Take 1 tablet (10 mg) by mouth 3 times daily.   . diazepam (VALIUM) 5 MG tablet Take 1 tablet (5 mg) by mouth daily as needed for Anxiety.   . DULoxetine (CYMBALTA) 60 MG CR capsule Take 2 capsules (120 mg) by mouth daily.   Marland Kitchen gabapentin (NEURONTIN) 300 MG capsule Take 2 capsules (600 mg) by mouth nightly.   . meclizine (ANTIVERT) 25 MG tablet Take 1 tablet (25 mg) by mouth every 8 hours as needed for Dizziness.   . nitrofurantoin monohydrate (MACROBID) 100 MG capsule Take 1 capsule (100 mg) by mouth 2 times daily.   . nortriptyline (PAMELOR) 25 MG capsule Take 2 capsules (50 mg) by mouth nightly.   . thyroid (ARMOUR THYROID) 15 MG tablet Take 1 tablet (15 mg) by mouth every other day.   . thyroid (ARMOUR THYROID) 60 MG tablet Take 1 tablet (60 mg) by mouth daily.   Marland Kitchen tiZANidine (ZANAFLEX) 4 MG tablet Take 1.5 tablets (6 mg) by mouth at bedtime.     No current facility-administered medications for this visit.        Past Medical History:   Diagnosis Date   . Asthma 1991    with pregnancy   . Hypothyroidism    . Major depressive disorder, single episode    .  MS (multiple sclerosis) (CMS-HCC)      Past Surgical History:   Procedure Laterality Date   . leep      for cervical dysplasia   . LYMPH NODE BIOPSY      IgG4 related disease   . TONSILLECTOMY AND ADENOIDECTOMY      only tonsillectomy       Family Medical History:  Family History   Problem Relation Name Age of Onset   . Other Mother Zara Chess    . Thyroid Mother Zara Chess    . Heart Disease Mother Zara Chess  heart failure   . Cancer Father don mc clain         lung   . Cancer Brother tom mcclain    . Heart Disease Brother tom mcclain         heart attack   . Cancer Sister Zara Chess         breast   . Diabetes Sister suzanne mcclain    . Thyroid Sister suzanne mcclain         car accident   . Stroke Brother [place]    . Cancer Brother Jeremy Johann         prostate cancer   . Cancer Brother john mc clain         copd lungs     ______________________________________________________________________    Examination (telephone encounter - no exam today; most recent exam findings as below):   There were no vitals taken for this visit.  General: Examination of the skin, joints and extremities revealed no abnormalities  ;  There were no cervical, ocular or cranial bruits.     Cognitive/Behavioral: Examination of cognition, language and prosody revealed no abnormalities . Behavioral and affect was appropriate.    Cranial Nerves: Near visual acuity wasJ10 OD and J2 OS with PH (previously 20/400 OD and J7 OS without correction).Visual fields were full  to confrontation testing. Lids were normal .  Pupils were 27m  and reactive with no  RAPD . Fundoscopic exam revealed no definite optic nerve pallor. Eyes were orthotropic with bilateral rotary nystagmus and left end point nystagmus.  Pursuit and saccadic eye movements revealed few beats of lateral nystagmus in both directions. Facial movements revealed no abnormalities. Testing of facial sensation revealed no abnormalities. Hearing was normal  AD and normal   AS. Bulbar examination revealed no abnormalities .     Motor: Upper extremity strength was as follows (R/L): Shoulder abduction 5/5, Elbow extension 5/5, Wrist extension 5/5, Hand instrinsics 5/5. Rapid alternating movements were graded 1/1+. Spasticity ([place]) was rated 1/1. Lower extremity strength was as follows (R/L): Hip Flexors 5/5, Knee Flexors 5/5, Ankle dorsiflexors 5/5; Toe tapping was rating 1/1; Spasticity was rated 1+/1  There was no atrophy. There were no fasiculations.    Cerebellar: Finger to nose testing revealed no abnormalities  in the RUE and no abnormalities  in the LUE. Heel to shin testing revealed no abnormalities  in the RLE and no abnormalities  in the LLE. (see gait for description of midline/axial cerebellum dysfunction)    Sensory: Sensation to pp and temperature was normal. Vibration sensation duration (secs) was (R/L): Upper extremity middle finger ND/ND; Lower extremity big toe   Joint position sensation was normal .      Reflexes: (R/L): Biceps 2+/2+, BR 2+/2+, Triceps 2+/2+, Patella 2+/2+, Ankle 2/2. There was no clonus .     Gait Description: normal gait and station.  Ambulation was performed safely without  assistance. The patient was able to  tandem walk 8 steps without faltering.   ----------------------------------------------------------------------------------------------------------------------  Performance Measures:  9-Hole PEG Test 05/26/2018 06/17/2017   RUE  19.8 21.5   RUE (Best) 17.9 19.5   LUE 19.8 22.7   LUE (Best) 18.5 19.6       25 Ft. Ambulation Time 05/26/2018 06/17/2017   25 ft Ambulation Time - 1st Trial (Seconds) 4.5 4.5   Independently or With Assistance? Independently Independently   25 ft Ambulation Time - 2nd Trial (Seconds) 4.6 4.5   Independently or With Assistance?  Independently Independently       No flowsheet data found.    No flowsheet data found.    No flowsheet data found.    No flowsheet data  found.    ----------------------------------------------------------------------------------------------------------------------    Review of Imaging Studies:  1. MRI brain 08/10/17 reviewed and compared to prior MRI dated 07/16/16: CLassic MS; New T2=3 (small discrete and all in DWM); relatively low burden. No gad lesions. Mild atrophy for age  4. MRI Brain 10/19/18 reviewed: no radiological evidence of disease activity    Review of Labs:  1. CSF reportedly with oligoclonal bands  JC virus 0.24  (09/11/15), 0.20 (01/02/16)    Rheumatology Plan 05/14/19:   repeat immunoglobulin levels every month   If IgG4 rising or worsening symptoms, consider repeat rituximab 1 g single dose   continue physical therapy for right rotator cuff tendinopathy    Assessment:   1.  Clinically definite Multiple Sclerosis, relapsing form, Stable clinically. MRI 08/10/2017 without activity but New T2 lesions since study dated 07/16/16; now on rituximab and tolerating well and most recent MRI 10/19/2018 (done because of vertigo) with no evidence of disease activity.    Atypical features (red flags) include: seizure at onset  Individual risk factors for disease activity and worsening disability include: IgG4 related disease    2.  Neuropathic pain: controlled on current regimen    3.  Anxiety and depression: better    4. Fatigue: Sleeps well and depression well controlled. No obvious medication causes and hypothyroidism well controlled. Provigil causes nausea.Amantadine helps     5. 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     6. IgG 4 related lymphadenopathy; isolated to cervical lymph notes; stable symptomatically    7. vestibular neuronitis (started 10/05/2018) now 80 % improved but still with positional vertigo although more difficult to provoke    8. Urinary hesitancy: will monitor, currently not  interested in starting medication    9. Infrequent falls: Will avoid alcohol, will reach out if imbalance worsens, in that case we will consider PT     After discussing the options available and the potential benefits (pros) and risks (cons), we mutually decided on the following management plan    Plan:  - lab work (B cell memory and naive panel (send out to [facility] using test code [id number]), immunoglobulin panel) at next blood draw, once resulted we will discuss with Dr. Candiss Norse time point of next Rituximab infusion  - avoid alcohol to not impact your balance given correlation to falls  - please let us know if imbalance worsens, in that case we can try physical therapy  - please let us know if urinary hesitancy worsens or you get another UTI  - follow-up in 6 months (in-person if pandemic permits)    Orders Placed This Encounter   Procedures   . Ig Panel (IgA, IgG, IgM) - See Instructions   . Lab Misc Test B cell memory and naive panel (send out to [facility] using test code [phone])   . Follow Up in This Department

## 2019-06-01 NOTE — Progress Notes (Addendum)
Patient  Discussed with Dr Sanjuan Dame on 06/01/2019. I personally reviewed and edited all elements of Dr Rempe's note and agree with the assessment and plan     59 year old WF with Clinically definite Multiple Sclerosis, relapsing form, history of anterior uveitis and  IgG4 related lymphadenopathy (Followed by Dr Candiss Norse)    5 falls in past year which she attributes to her drinking. Working on both drinking cessation and balance    IgG4 levels slight high (135)    Will discuss timing of next rituximab infusion with Dr Candiss Norse. We usually retreat based on CD27+ CM B cell counts but she may want to treat earlier because of mildly elevated IgG4.     Time spent reviewing chart, outside records, communications and imaging studies in preparation for visit, face to face time, counseling, completing orders, and documenting encounter : 30 minutes    Addendum 07/30/2019 (Based on conversation with Bosie Clos in staff messages):  Rosela has now seen Dr. 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 Mildly active OD and mildly symptomatic for new floaters and blurriness, last Rituxan dose June 2020, pending repeat Rituxan two weeks after second COVID dose 07/25/19. Start PF qid now OD with weekly taper."     Given this finding we will treat 2 weeks after second vaccine     Per Dr. Rosendo Gros: In the future once immunized we should treat every 6 months if Ig levels okay or consider another treatment      She has ongoing workup for syncope/dizziness with cardiology- will have echo 07/31/19 and follow up 08/09/19. Will schedule infusion for after this.      She is also being scheduled for colonoscopy for workup of rectal bleeding.      Spoke to [place] - She will receive second vaccine today. She agrees to rituximab treatment around 4/26. Can delay this if workups or other providers feel that is necessary.      She has a trip 4/30- 5/9 to [place]. We  mutually decided that treatment prior to her trip would be beneficial so long as she is planning to practice social distancing, masking, and good precautions during her trip.

## 2019-06-03 ENCOUNTER — Encounter (INDEPENDENT_AMBULATORY_CARE_PROVIDER_SITE_OTHER): Payer: Self-pay | Admitting: Family Practice

## 2019-06-04 ENCOUNTER — Other Ambulatory Visit: Payer: Self-pay

## 2019-06-05 ENCOUNTER — Encounter (HOSPITAL_BASED_OUTPATIENT_CLINIC_OR_DEPARTMENT_OTHER): Payer: Self-pay | Admitting: Neurology

## 2019-06-05 DIAGNOSIS — Z1231 Encounter for screening mammogram for malignant neoplasm of breast: Secondary | ICD-10-CM | POA: Diagnosis not present

## 2019-06-05 NOTE — Telephone Encounter (Signed)
From: [name]  To: Christin Bach, MD  Sent: 06/03/2019 7:12 PM PST  Subject: 1-Non Urgent Medical Advice    I had another fall in the bathroom on Monday, and a friend took me to [place] on Thursday. They did a cat scan on my face and head. I have a cardiology appt. Thursday. The bump on my right forehead seems to swell and enlarge off and on. Any further suggestions? Just trying to keep you in this awful loop. Let me know if I am doing all the right things Thanks  Debra Bolton [phone]

## 2019-06-07 ENCOUNTER — Encounter (INDEPENDENT_AMBULATORY_CARE_PROVIDER_SITE_OTHER): Payer: Medicare Other | Admitting: Interventional Cardiology

## 2019-06-10 DIAGNOSIS — R079 Chest pain, unspecified: Secondary | ICD-10-CM

## 2019-06-10 LAB — ECG 12-LEAD
ATRIAL RATE: 73 {beats}/min
ECG INTERPRETATION: NORMAL
P AXIS: 30 degrees
PR INTERVAL: 156 ms
QRS INTERVAL/DURATION: 86 ms
QT: 400 ms
QTc (Bazett): 440 ms
R AXIS: 67 degrees
T AXIS: 31 degrees
VENTRICULAR RATE: 73 {beats}/min

## 2019-06-11 ENCOUNTER — Ambulatory Visit (INDEPENDENT_AMBULATORY_CARE_PROVIDER_SITE_OTHER): Payer: Medicare Other | Admitting: Family Practice

## 2019-06-11 ENCOUNTER — Encounter: Payer: Self-pay | Admitting: Legal Medicine

## 2019-06-14 ENCOUNTER — Encounter (INDEPENDENT_AMBULATORY_CARE_PROVIDER_SITE_OTHER): Payer: Self-pay | Admitting: Interventional Cardiology

## 2019-06-14 ENCOUNTER — Ambulatory Visit (INDEPENDENT_AMBULATORY_CARE_PROVIDER_SITE_OTHER): Payer: Medicare Other | Admitting: Interventional Cardiology

## 2019-06-14 VITALS — BP 111/77 | HR 90 | Temp 98.3°F | Resp 16 | Ht 64.0 in | Wt 132.0 lb

## 2019-06-14 DIAGNOSIS — R42 Dizziness and giddiness: Secondary | ICD-10-CM

## 2019-06-14 NOTE — Patient Instructions (Signed)
Recommend compression stocking to below knee (low or intermediate pressure level)

## 2019-06-14 NOTE — Interdisciplinary (Signed)
Per Dr. Amedeo Plenty, orthostatic VS performed:    Lying 120/78, 90    Sitting 112/75, HR 90    Standing 111/75, HR 91

## 2019-06-14 NOTE — Progress Notes (Signed)
CARDIOLOGY CLINIC INITIAL VISIT NOTE    DATE of VISIT: 06/14/2019  REASON FOR VISIT: Dizziness and chest pain  REFERRING PROVIDER:  Edi, Rina MD     HPI: Debra Bolton is a 59 year old female with h/o of MS, IgG4 related disease, PTSD, depression, anxiety and insomnia who is referred to [place] for dizziness evaluation. Patient reports 6 falls for the last 9 months some of them were in restroom when she feels dizzy. One time after reaching out to toilet tissue paper she stumped forward and hit her head. She is been almost daily feeling dizzy when standing up. She felt on rare occasions heart racing feeling but does not correlate with dizziness. She does have known vertigo but this is different. She denies dizziness when exercising or sitting down, another pre-syncope event she reports is when she was getting out of bathtub.  She also reports very rare right chest bone pain non exertional.  She exercise well and practice Yoga on zoom multiple times per week. She also run/walk on treadmill almost 5/week each for 45 min without issues,     ROS: As per HPI, otherwise the remaining 12-point  ROS was reviewed and is negative.    PMH:  Patient Active Problem List    Diagnosis Date Noted   . Dental caries, unspecified 05/23/2019   . Deposits (accretions) on teeth 05/23/2019   . Adequate exercise, at or above goal 10/16/2018   . Rotator cuff tear 05/07/2018   . Mood disorder (CMS-HCC) 03/29/2017   . PTSD (post-traumatic stress disorder) 03/29/2017   . 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    Posterior polar cataract OD: congenital?     . Posterior subcapsular age-related cataract, right eye 02/04/2017   . IgG4 related disease (CMS-HCC) 01/19/2017   . Multiple sclerosis (CMS-HCC) 12/10/2015     Past Medical History:   Diagnosis Date   . Asthma 1991    with pregnancy   . Hypothyroidism    . Major  depressive disorder, single episode    . MS (multiple sclerosis) (CMS-HCC)        SH:  Social History     Tobacco Use   . Smoking status: Former Smoker     Packs/day: 1.00     Years: 15.00     Pack years: 15.00     Types: Cigarettes     Quit date: 01/27/2016     Years since quitting: 3.3   . Smokeless tobacco: Never Used   Substance Use Topics   . Alcohol use: Yes     Alcohol/week: 2.0 standard drinks     Types: 2 Glasses of wine per week     Frequency: 2-4 times a month     Drinks per session: 1 or 2     Binge frequency: Never   . Drug use: Never     Smoking for total of 20 years , one pack per day. Quit 10 years ago.     FH:  family history includes Cancer in her brother, brother, brother, father, and sister; Diabetes in her sister; Heart Disease in her brother and mother; Other in her mother; Stroke in her brother; Thyroid in her mother and sister.     Brother MI in 54's   Another brother stroke - 36     Meds:  Outpatient Medications  Marked as Taking for the 06/14/19 encounter (Office Visit) with Mackey Birchwood, MD   Medication Sig Dispense Refill   . albuterol (VENTOLIN HFA) 108 (90 Base) MCG/ACT inhaler Inhale 2 puffs by mouth every 6 hours as needed for Wheezing. 3 Inhaler 3   . Amantadine HCl 100 MG tablet Take 100 mg by mouth 2 times daily. Takes PRN 60 tablet 0   . baclofen (LIORESAL) 10 MG tablet Take 1 tablet (10 mg) by mouth 3 times daily. (Patient taking differently: Take 10 mg by mouth nightly. ) 90 tablet 11   . diazepam (VALIUM) 5 MG tablet Take 1 tablet (5 mg) by mouth daily as needed for Anxiety. 7 tablet 0   . DULoxetine (CYMBALTA) 60 MG CR capsule Take 2 capsules (120 mg) by mouth daily. 180 capsule 3   . gabapentin (NEURONTIN) 300 MG capsule Take 2 capsules (600 mg) by mouth nightly. 60 capsule 5   . meclizine (ANTIVERT) 25 MG tablet Take 1 tablet (25 mg) by mouth every 8 hours as needed for Dizziness. 30 tablet 0   . nortriptyline (PAMELOR) 25 MG capsule Take 2 capsules (50 mg) by  mouth nightly. 180 capsule 3   . thyroid (ARMOUR THYROID) 15 MG tablet Take 1 tablet (15 mg) by mouth every other day. 45 tablet 2   . thyroid (ARMOUR THYROID) 60 MG tablet Take 1 tablet (60 mg) by mouth daily. 90 tablet 2   . tiZANidine (ZANAFLEX) 4 MG tablet Take 1.5 tablets (6 mg) by mouth at bedtime. 135 tablet 3       ALLERGIES:   Allergies   Allergen Reactions   . Penicillins Rash   . Modafinil Diarrhea and Nausea and Vomiting         PHYSICAL EXAM:  Vitals:    06/14/19 1431   BP: 111/77   BP Location: Left arm   BP Patient Position: Sitting   BP cuff size: Regular   Pulse: 90   Resp: 16   Temp: 98.3 F (36.8 C)   TempSrc: Temporal   SpO2: 96%   Weight: 59.9 kg (132 lb)   Height: 5\' 4"  (1.626 m)     General: A&O x3, No apparent distress.  HEENT: PERRLA, EOMI, OP clear no evidence of thrush or exudate.   Neck: Supple, No JVD, no carotid bruit bilaterally, no cervical lymphadenopathy  Pul: Symmetric expansion of chest, CTAB, no wheezes, rhonci, or crackles  CV: Regular rate and rhythm, normal S1S2. No murmurs, rubs or gallops.   Abd: Soft, non-tender, normal apparent bowel sounds in all four quadrants. No rebound or guarding.  Ext: Warm and well perfused bilateral lower extremities. No clubbing, cyanosis, or edema.  + PT and DP pulses bilaterally  Neuro: CN II-XII grossly intact. No focal deficits are appreciated. No motor or sensory deficits noted.  Skin: no rashes, lesions, erythema, or ecchymosis.    Lab Results   Component Value Date    CHOL 231 11/18/2017    HDL 98 11/18/2017    LDLCALC 116 11/18/2017    TRIG 86 11/18/2017     Lab Results   Component Value Date    NA 139 05/23/2019    K 3.9 05/23/2019    CL 101 05/23/2019    BICARB 25 05/23/2019    BUN 11 05/23/2019    CREAT 0.85 05/23/2019    GLU 66 (L) 05/23/2019    [place] 9.4 05/23/2019     Lab Results   Component Value Date  WBC 6.5 05/23/2019    RBC 4.14 05/23/2019    HGB 12.2 05/23/2019    HCT 37.5 05/23/2019    MCV 90.6 05/23/2019    MCHC 32.5  05/23/2019    RDW 11.5 (L) 05/23/2019    PLT 259 05/23/2019    MPV 9.9 05/23/2019         STUDIES:  ECG today (personally interpreted): NSR     Assessment and Plan:    58 year old female with h/o of MS, IgG4 related disease, 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 Syncope appears to be consistent with common faint/vasovagal but due to harm/injury will perform basic cardiac work up. Her postural symptoms could be due to her progressive MS. Her orthostatics today were not significant. Also possible medications side effects given polypharmacy but unable to find culprit as no changes recently on her regimen.   -Agree with event monitor (ordered already)   -Echocardiogram   -Carotid duplex   -Lifestyle modifications, hydration, increase salt intake   -Below knee Compression stocking - low to intermediate level when active   -6 weeks follow up to discuss results     **Family history of premature CAD. She is asymptomatic. Very rare chest pain appears to be non cardiac and not typical. Patient has good functional activity without cp. Hx of previous smoking 20 pack year.    -Will consider risk stratifications studies next visit. LDL 116 in 2019   -Patient not interested in calcium score CT scan given it is going to be out of pocket.     **Multiple sclerosis and IGg4 disease:   -follow up with neurology and rheumatology in [place]     RTC in 6 weeks     Seleta Rhymes, MD, [facility], [facility]  Assistant Professor of Medicine   Interventional Cardiology    [place] [facility] health   Pager [phone]

## 2019-06-15 ENCOUNTER — Encounter (INDEPENDENT_AMBULATORY_CARE_PROVIDER_SITE_OTHER): Payer: Self-pay | Admitting: Hospital

## 2019-06-15 LAB — ECG 12-LEAD
ATRIAL RATE: 87 {beats}/min
ECG INTERPRETATION: NORMAL
P AXIS: 46 degrees
PR INTERVAL: 158 ms
QRS INTERVAL/DURATION: 88 ms
QT: 344 ms
QTc (Bazett): 413 ms
R AXIS: 68 degrees
T AXIS: 28 degrees
VENTRICULAR RATE: 87 {beats}/min

## 2019-06-18 ENCOUNTER — Telehealth (INDEPENDENT_AMBULATORY_CARE_PROVIDER_SITE_OTHER): Payer: Self-pay | Admitting: Internal Medicine

## 2019-06-18 ENCOUNTER — Other Ambulatory Visit (INDEPENDENT_AMBULATORY_CARE_PROVIDER_SITE_OTHER): Payer: Medicare Other

## 2019-06-18 ENCOUNTER — Encounter (INDEPENDENT_AMBULATORY_CARE_PROVIDER_SITE_OTHER): Payer: Self-pay | Admitting: Family Practice

## 2019-06-18 ENCOUNTER — Ambulatory Visit (INDEPENDENT_AMBULATORY_CARE_PROVIDER_SITE_OTHER): Payer: Medicare Other | Admitting: Family Practice

## 2019-06-18 ENCOUNTER — Other Ambulatory Visit: Payer: Medicare Other | Attending: Family Practice

## 2019-06-18 VITALS — BP 127/83 | HR 74 | Temp 97.7°F | Resp 18 | Ht 64.0 in | Wt 132.1 lb

## 2019-06-18 DIAGNOSIS — G35 Multiple sclerosis: Secondary | ICD-10-CM | POA: Insufficient documentation

## 2019-06-18 DIAGNOSIS — D8989 Other specified disorders involving the immune mechanism, not elsewhere classified: Secondary | ICD-10-CM

## 2019-06-18 DIAGNOSIS — R233 Spontaneous ecchymoses: Secondary | ICD-10-CM

## 2019-06-18 DIAGNOSIS — R296 Repeated falls: Secondary | ICD-10-CM

## 2019-06-18 DIAGNOSIS — T148XXA Other injury of unspecified body region, initial encounter: Secondary | ICD-10-CM | POA: Insufficient documentation

## 2019-06-18 DIAGNOSIS — R748 Abnormal levels of other serum enzymes: Secondary | ICD-10-CM

## 2019-06-18 DIAGNOSIS — R109 Unspecified abdominal pain: Secondary | ICD-10-CM | POA: Insufficient documentation

## 2019-06-18 DIAGNOSIS — R7309 Other abnormal glucose: Secondary | ICD-10-CM

## 2019-06-18 DIAGNOSIS — E162 Hypoglycemia, unspecified: Secondary | ICD-10-CM

## 2019-06-18 DIAGNOSIS — R238 Other skin changes: Secondary | ICD-10-CM

## 2019-06-18 LAB — COMPREHENSIVE METABOLIC PANEL, BLOOD
ALT (SGPT): 15 U/L (ref 0–33)
AST (SGOT): 19 U/L (ref 0–32)
Albumin: 4.5 g/dL (ref 3.5–5.2)
Alkaline Phos: 56 U/L (ref 35–140)
Anion Gap: 9 mmol/L (ref 7–15)
BUN: 13 mg/dL (ref 6–20)
Bicarbonate: 27 mmol/L (ref 22–29)
Bilirubin, Tot: 0.37 mg/dL (ref <1.2)
Calcium: 10 mg/dL (ref 8.5–10.6)
Chloride: 103 mmol/L (ref 98–107)
Creatinine: 0.88 mg/dL (ref 0.51–0.95)
GFR: >60 mL/min
Glucose: 89 mg/dL (ref 70–99)
Potassium: 3.9 mmol/L (ref 3.5–5.1)
Sodium: 139 mmol/L (ref 136–145)
Total Protein: 7.4 g/dL (ref 6.0–8.0)

## 2019-06-18 LAB — CBC WITH DIFF, BLOOD
ANC-Automated: 2.4 10*3/uL (ref 1.6–7.0)
Abs Basophils: 0 10*3/uL
Abs Eosinophils: 0.2 10*3/uL (ref 0.0–0.5)
Abs Lymphs: 2.3 10*3/uL (ref 0.8–3.1)
Abs Monos: 0.4 10*3/uL (ref 0.2–0.8)
Basophils: 1 %
Eosinophils: 3 %
Hct: 38.8 % (ref 34.0–45.0)
Hgb: 12.5 gm/dL (ref 11.2–15.7)
Lymphocytes: 43 %
MCH: 29.8 pg (ref 26.0–32.0)
MCHC: 32.2 g/dL (ref 32.0–36.0)
MCV: 92.6 um3 (ref 79.0–95.0)
MPV: 9.7 fL (ref 9.4–12.4)
Monocytes: 7 %
Plt Count: 280 10*3/uL (ref 140–370)
RBC: 4.19 10*6/uL (ref 3.90–5.20)
RDW: 12.1 % (ref 12.0–14.0)
Segs: 45 %
WBC: 5.3 10*3/uL (ref 4.0–10.0)

## 2019-06-18 LAB — PROTHROMBIN TIME, BLOOD
INR: 1
PT,Patient: 10.7 s (ref 9.7–12.5)

## 2019-06-18 LAB — SED RATE, BLOOD: Sed Rate: 7 mm/hr (ref 0–30)

## 2019-06-18 LAB — APTT, BLOOD: PTT: 33 s (ref 25–34)

## 2019-06-18 LAB — C-REACTIVE PROTEIN, BLOOD: CRP: 0.06 mg/dL (ref <0.5)

## 2019-06-18 LAB — IMMUNOGLOBULIN PANEL (IGA,IGG,IGM), BLOOD
IGA: 485 mg/dL — ABNORMAL HIGH (ref 70–400)
IGG: 960 mg/dL (ref 700–1600)
IGM: 30 mg/dL — ABNORMAL LOW (ref 40–230)

## 2019-06-18 LAB — GLYCOSYLATED HGB(A1C), BLOOD: Glyco Hgb (A1C): 5.3 % (ref 4.8–5.8)

## 2019-06-18 LAB — CPK-CREATINE PHOSPHOKINASE, BLOOD: CPK: 209 U/L — ABNORMAL HIGH (ref 0–175)

## 2019-06-18 NOTE — Patient Instructions (Signed)
Please get your labs done  Please get PT  Please follow up in 2 months  Let me know if anything changes

## 2019-06-18 NOTE — Telephone Encounter (Signed)
[place] for 07/23/19 has been cancelled due to MD being out of office. Left message for pt to call back to reschedule.

## 2019-06-18 NOTE — Interdisciplinary (Signed)
stool

## 2019-06-18 NOTE — Telephone Encounter (Signed)
From: Debra Bolton  To: [name], [place]  Sent: 06/15/2019 1:07 PM PST  Subject: [id number]    see you at 10:20 monday morning  thank you      ----- Message -----   From:Cher Franzoni Doris Cheadle, LVN   Sent:06/11/2019 3:09 PM PST   GJ:[id number] [name]   Subject:RE:Appointment    Good afternoon [name],    I went ahead and scheduled you a in clinic visit on 06/18/2019@1040  with an early 15 minute check in time of 1020am. Please let me know if that works?      ----- Message -----   From:Debra Bolton   Sent:06/09/2019 7:22 AM PST   [id number] Doris Cheadle, LVN   Subject:RE:Appointment    I am free with a ride Feb. 26th-March 5th, If any of those days work? Otherwise we can do a telephone visit anytime at [phone].  Thanks for your help.      ----- Message -----   From:Debra Bolton Doris Cheadle, LVN   Sent:06/08/2019 4:43 PM PST   GJ:[id number] [name]   Subject:RE:Appointment    Unfortunately she does not have any availablity those days. I can change the appointment to a video appointment if you like?      ----- Message -----   From:Debra Bolton   Sent:06/08/2019 4:24 PM PST   [id number] Doris Cheadle, LVN   Subject:RE:Appointment    This doesn't work for me as I am not driving the distance right now. Any chance we could do March 4th or 5th?  I have someone who can take me on those days>  Debra Bolton      ----- Message -----   From:[name] Doris Cheadle, LVN   Sent:06/08/2019 10:15 AM PST   GJ:[id number] [name]   Subject:Appointment    Good morning [name],    I had scheduled you a in office visit to see Dr. Rana Snare on Monday 06/11/2019@1040am  with a check in time of 15 minutes prior. Please let me know if this works for you as this is the earliest appointment with Dr. Rana Snare.

## 2019-06-18 NOTE — Progress Notes (Signed)
SUBJECTIVE:  Debra Bolton is a 59 year old female here for follow up fall        Falls- has seen neurology and cardiology   Cardiology ordered ECHO and carotid duplex    When she fell she didn't feel dizzy, she was reaching for toilet paper and fell onto her head and hit her arm on the toilet paper stand  She didn't faint until she hit her head then blacked out  She went to ER at [facility] had CT scan of head negative for ICH  She was not straining on the toilet, just urinate    After she had vertigo she was told had neuritis was doing yoga slow reteaching her body     When she does her yoga no vertigo with it, not feeling like she is going to fall    She has not fallen since fall on 2/7        Easy bruising- thought she had more bruising then expected after last fall  She was taking aspirin due to her family history of stroke  She had bruising on arm that was quite extensive and by eye  No bleeding gums  No nose bleeds  No bleeding after brushing teeth    Has noted blood in stool, blood in toilet and around stool when wipes  She has appointment with GI to get colonoscopy    Issues with urination-  She is still having some trouble sometimes urinating  She talked with urologist, told medication might help empty her urine better, might lower blood pressure      They are going to put bars in the bathroom to help prevent falls    Has fallen 5 times but not associated with drinking  She has stopped drinking alcohol        Review of Systems -   See HPI    HISTORY:  Patient Active Problem List   Diagnosis   . Multiple sclerosis (CMS-HCC)   . IgG4 related disease (CMS-HCC)   . Uveitis of right eye   . 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 Mood disorder (CMS-HCC)   . PTSD (post-traumatic stress disorder)   . Adequate exercise, at or above goal   . Rotator cuff tear   . Dental caries, unspecified   . Deposits (accretions) on teeth     Past Medical History:   Diagnosis Date   . Asthma 1991    with pregnancy    . Hypothyroidism    . Major depressive disorder, single episode    . MS (multiple sclerosis) (CMS-HCC)      Past Surgical History:   Procedure Laterality Date   . leep      for cervical dysplasia   . LYMPH NODE BIOPSY      IgG4 related disease   . TONSILLECTOMY AND ADENOIDECTOMY      only tonsillectomy       Current Outpatient Medications on File Prior to Visit   Medication Sig Dispense Refill   . albuterol (VENTOLIN HFA) 108 (90 Base) MCG/ACT inhaler Inhale 2 puffs by mouth every 6 hours as needed for Wheezing. 3 Inhaler 3   . Amantadine HCl 100 MG tablet Take 100 mg by mouth 2 times daily. Takes PRN 60 tablet 0   . baclofen (LIORESAL) 10 MG tablet Take 1 tablet (10 mg) by mouth 3 times daily. (Patient taking differently: Take 10 mg by mouth nightly. ) 90 tablet 11   . diazepam (VALIUM)  5 MG tablet Take 1 tablet (5 mg) by mouth daily as needed for Anxiety. 7 tablet 0   . DULoxetine (CYMBALTA) 60 MG CR capsule Take 2 capsules (120 mg) by mouth daily. 180 capsule 3   . gabapentin (NEURONTIN) 300 MG capsule Take 2 capsules (600 mg) by mouth nightly. 60 capsule 5   . meclizine (ANTIVERT) 25 MG tablet Take 1 tablet (25 mg) by mouth every 8 hours as needed for Dizziness. 30 tablet 0   . nortriptyline (PAMELOR) 25 MG capsule Take 2 capsules (50 mg) by mouth nightly. 180 capsule 3   . thyroid (ARMOUR THYROID) 15 MG tablet Take 1 tablet (15 mg) by mouth every other day. 45 tablet 2   . thyroid (ARMOUR THYROID) 60 MG tablet Take 1 tablet (60 mg) by mouth daily. 90 tablet 2   . tiZANidine (ZANAFLEX) 4 MG tablet Take 1.5 tablets (6 mg) by mouth at bedtime. 135 tablet 3     No current facility-administered medications on file prior to visit.      Allergies   Allergen Reactions   . Penicillins Rash   . Modafinil Diarrhea and Nausea and Vomiting     Family History   Problem Relation Name Age of Onset   . Other Mother Zara Chess    . Thyroid Mother Zara Chess    . Heart Disease Mother Zara Chess         heart failure   .  Cancer Father don mc clain         lung   . Cancer Brother tom mcclain    . Heart Disease Brother tom mcclain         heart attack   . Cancer Sister Zara Chess         breast   . Diabetes Sister suzanne mcclain    . Thyroid Sister suzanne mcclain         car accident   . Stroke Brother [place]    . Cancer Brother Jeremy Johann         prostate cancer   . Cancer Brother john mc clain         copd lungs     Social History     Socioeconomic History   . Marital status: Divorced     Spouse name: Not on file   . Number of children: Not on file   . Years of education: Not on file   . Highest education level: Not on file   Occupational History   . Not on file   Social Needs   . Financial resource strain: Not on file   . Food insecurity     Worry: Not on file     Inability: Not on file   . Transportation needs     Medical: Not on file     Non-medical: Not on file   Tobacco Use   . Smoking status: Former Smoker     Packs/day: 1.00     Years: 15.00     Pack years: 15.00     Types: Cigarettes     Quit date: 01/27/2016     Years since quitting: 3.3   . Smokeless tobacco: Never Used   Substance and Sexual Activity   . Alcohol use: Yes     Alcohol/week: 2.0 standard drinks     Types: 2 Glasses of wine per week     Frequency: 2-4 times a month     Drinks per session:  1 or 2     Binge frequency: Never   . Drug use: Never   . Sexual activity: Yes     Partners: Male     Birth control/protection: Rhythm   Lifestyle   . Physical activity     Days per week: Not on file     Minutes per session: Not on file   . Stress: Not on file   Relationships   . Social Product manager on phone: Not on file     Gets together: Not on file     Attends religious service: Not on file     Active member of club or organization: Not on file     Attends meetings of clubs or organizations: Not on file     Relationship status: Not on file   . Intimate partner violence     Fear of current or ex partner: Not on file     Emotionally abused: Not on file      Physically abused: Not on file     Forced sexual activity: Not on file   Other Topics Concern   . Military Service No   . Blood Transfusions No   . Caffeine Concern No   . Occupational Exposure No   . Hobby Hazards No   . Sleep Concern No   . Stress Concern No   . Weight Concern No   . Special Diet No   . Back Care No   . Exercise Not Asked   . Bike Helmet Not Asked   . Seat Belt Not Asked   . Self-Exams Not Asked   . Exercises Regularly Yes   . Bike Helmet Use Yes   . Seat Belt Use Yes   . Performs Self-Exams Yes   Social History Narrative    Moved from [place] is great, high fiber, she is very conscientious of effect of diet on health.    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    Hasn't had a cigarette in 8 months.    Has smoked 26 years, less than a pack a day.    Currently drinks 2-4 drinks a day on the weekends, total 4-8 per week.    Sexually active with one female partner.    Currently not working, has been on disability for most of the time from 1992 to now.       OBJECTIVE:    BP 127/83 (BP Location: Left arm, BP Patient Position: Sitting, BP cuff size: Regular)   Pulse 74   Temp 97.7 F (36.5 C) (Temporal)   Resp 18   Ht 5\' 4"  (1.626 m)   Wt 59.9 kg (132 lb 0.9 oz)   SpO2 100%   Breastfeeding No   BMI 22.67 kg/m     Body mass index is 22.67 kg/m.  Wt Readings from Last 5 Encounters:   06/18/19 59.9 kg (132 lb 0.9 oz)   06/14/19 59.9 kg (132 lb)   05/23/19 61.3 kg (135 lb 0.5 oz)   11/10/18 60.3 kg (132 lb 15 oz)   10/16/18 60.6 kg (133 lb 9.6 oz)     Blood Pressure   06/18/19 127/83   06/14/19 111/77   05/23/19 128/79   11/10/18 134/84   10/16/18 117/82       General: WDWN, NAD, answers appropriately  Eyes: anicteric, PERRL, EOMI  Neuro: finger to nose normal, gait normal, heel to toe gait noted slight imbalance, romberg negative  Psych: alert and oriented x 3, appropriate affect  Skin: healing bruise noted on left arm and healing bruise by left eye    CT scan from [place]: Reason:  left eye swelling and bruising                  COMPARISON: No previous study available.                                  TECHNIQUE: Study performed per protocol.                                  CT Dose:                                                                As required by [facility] law, the CTDIvol and DLP radiation doses      associated with this CT study are listed below. This represents the     estimated dose to a standard lucite phantom resulting from the          technique used for this study, but is not the dose to this specific     patient.                                                                Type / CTDIvol / DLP / Phantom                                          Orbit region / 20.35 / 351.03 / H                                       Total Exam DLP: 351.03                                                  CTDIvol = mGy             DLP = mGy-cm                                  Phantom: B=Body32, H=Head16                                                 FINDINGS:  Bony structures are intact without evidence of fracture or bone         destruction.                                                            The paranasal sinuses and bilateral mastoid air cells are normally      aerated.                                                                The bilateral orbital contents appear normal.                           Left frontal scalp mild swelling and focal hematoma seen.                   IMPRESSION:                                                               1. No fractures.                                                        2. Left frontal scalp mild swelling and focal hematoma.     ASSESMENT AND PLAN:       ICD-10-CM ICD-9-CM    1. Frequent falls - saw cardiology for work up, going to get ECHO and monitoring and carotid duplex, has seen neurology, will refer to PT to help with balance  R29.6 V15.88 Comprehensive  Metabolic Panel (Expected today and later)      Glycosylated Hgb(A1C), Blood Lavender      Consult to [facility] - Internal   2. Elevated CPK - recheck  R74.8 790.5 CPK, Blood Green Plasma Separator Tube   3. Easy bruising  R23.8 782.9 Prothrombin Time, Blood Blue   Likely due to being on aspirin when she fell, will get labs, discussed stop aspirin at this time   aPTT, Blood Blue      CBC w/ Diff Lavender   4. Other abnormal glucose   R73.09 790.29 Glycosylated Hgb(A1C), Blood Lavender   5. Bruise   T14.8XXA 924.9 Prothrombin Time, Blood Blue      aPTT, Blood Blue     Pre-visit planning reviewing the last office visit, labs, imaging and care everywhere when applicable was 2 minutes  Intra-visit was 25 minutes and included updating the relevant history, performing a physical exam as appropriate, creating a treatment plan and used shared decision making with the patient.   Post-visit was 9 minutes that encompassed note completion, placing of orders, updating patient instructions and coordination of care.    Total duration of encounter spent in  the above activities, excluding separately reportable services/procedures: 36 minutes  F/U: Return in about 1 month (around 07/19/2019) for f/u falls.     Medication Management:  Medications reviewed with patient and medication list reconciled.  Over the counter medications, herbal therapies and supplements reviewed.  Patient's understanding and response to medications assessed.    Barriers to medications assessed and addressed.  Risks, benefits, alternatives to medications reviewed.    Randa Ngo, MD  Department of Family Medicine and [place], [place] of Medicine  HS Assistant Clinical Professor, [employer] of Medicine

## 2019-06-19 ENCOUNTER — Encounter (INDEPENDENT_AMBULATORY_CARE_PROVIDER_SITE_OTHER): Payer: Self-pay | Admitting: Family Practice

## 2019-06-19 ENCOUNTER — Encounter (INDEPENDENT_AMBULATORY_CARE_PROVIDER_SITE_OTHER): Payer: Self-pay | Admitting: Hospital

## 2019-06-19 ENCOUNTER — Other Ambulatory Visit: Payer: Self-pay | Admitting: Legal Medicine

## 2019-06-19 LAB — H. PYLORI ANTIGEN: H. Pylori Antigen: NOT DETECTED

## 2019-06-20 ENCOUNTER — Encounter (INDEPENDENT_AMBULATORY_CARE_PROVIDER_SITE_OTHER): Payer: Self-pay | Admitting: Family Practice

## 2019-06-20 ENCOUNTER — Other Ambulatory Visit: Payer: Self-pay

## 2019-06-20 ENCOUNTER — Encounter (HOSPITAL_COMMUNITY): Payer: Self-pay

## 2019-06-20 DIAGNOSIS — R296 Repeated falls: Secondary | ICD-10-CM

## 2019-06-20 LAB — IGG SUBCLASS PANEL, BLOOD
IGG Subclass 1: 466 mg/dL (ref 240–1118)
IGG Subclass 2: 258 mg/dL (ref 124–549)
IGG Subclass 3: 13 mg/dL — ABNORMAL LOW (ref 21–134)
IGG Subclass 4: 123 mg/dL (ref 1–123)

## 2019-06-20 NOTE — Telephone Encounter (Signed)
From: [name]  To: Christin Bach, MD  Sent: 06/19/2019 5:24 PM PST  Subject: 1-Non Urgent Medical Advice    Dear Dr. Rana Snare,  Just a few things, I had all the blood tests done, but could not give a urine sample, just didn't work, but I will as soon as I am by a [facility] [place] lab again.  Also I heard [place]. is opening up co-vid shots for high risk patients. Am I included in that class and how do I go about getting into that group.   Thanks for your help Debra Bolton

## 2019-06-21 NOTE — Telephone Encounter (Signed)
From: [name]  To: Christin Bach, MD  Sent: 06/20/2019 12:32 PM PST  Subject: 1-Non Urgent Medical Advice    sorry to bother you again, but can I go to physical therapy near me? When I hurt my shoulder I went to PT in [facility]. Can I do the PT here again?  Thanks [name]

## 2019-06-22 ENCOUNTER — Other Ambulatory Visit (INDEPENDENT_AMBULATORY_CARE_PROVIDER_SITE_OTHER): Payer: Medicare Other

## 2019-06-22 ENCOUNTER — Other Ambulatory Visit: Payer: Self-pay

## 2019-06-22 ENCOUNTER — Ambulatory Visit
Admission: RE | Admit: 2019-06-22 | Discharge: 2019-06-22 | Disposition: A | Discharge status: Home / Self Care | Payer: Medicare Other | Attending: Interventional Cardiology | Admitting: Interventional Cardiology

## 2019-06-22 ENCOUNTER — Telehealth (HOSPITAL_COMMUNITY): Payer: Self-pay | Admitting: Family Practice

## 2019-06-22 DIAGNOSIS — R42 Dizziness and giddiness: Secondary | ICD-10-CM | POA: Insufficient documentation

## 2019-06-22 LAB — URINALYSIS
Bilirubin: NEGATIVE
Blood: NEGATIVE
Glucose: NEGATIVE
Ketones: NEGATIVE
Leuk Esterase: NEGATIVE Leu/uL
Nitrite: NEGATIVE
Protein: NEGATIVE
Specific Gravity: 1.007 (ref 1.002–1.030)
Urobilinogen: NEGATIVE
pH: 7 (ref 5.0–8.0)

## 2019-06-22 LAB — LAB MISC TEST

## 2019-06-22 NOTE — Telephone Encounter (Signed)
Cardionet (Biotel) Event Monitor has been ordered. For more information on delivery status please contact [phone].

## 2019-06-25 ENCOUNTER — Encounter (INDEPENDENT_AMBULATORY_CARE_PROVIDER_SITE_OTHER): Payer: Self-pay

## 2019-06-25 ENCOUNTER — Encounter (INDEPENDENT_AMBULATORY_CARE_PROVIDER_SITE_OTHER): Payer: Self-pay | Admitting: Gastroenterology

## 2019-06-25 ENCOUNTER — Telehealth (INDEPENDENT_AMBULATORY_CARE_PROVIDER_SITE_OTHER): Payer: Medicare Other | Admitting: Gastroenterology

## 2019-06-25 ENCOUNTER — Telehealth (INDEPENDENT_AMBULATORY_CARE_PROVIDER_SITE_OTHER): Payer: Self-pay | Admitting: Gastroenterology

## 2019-06-25 VITALS — Ht 64.0 in | Wt 130.0 lb

## 2019-06-25 DIAGNOSIS — K625 Hemorrhage of anus and rectum: Secondary | ICD-10-CM | POA: Insufficient documentation

## 2019-06-25 MED ORDER — SUPREP BOWEL PREP KIT 17.5-3.13-1.6 GM/177ML PO SOLN
1.0000 | Freq: Once | ORAL | 0 refills | Status: AC
Start: 2019-06-25 — End: 2019-06-25

## 2019-06-25 NOTE — Progress Notes (Signed)
Carotid duplex is normal

## 2019-06-25 NOTE — Telephone Encounter (Signed)
Please let her know I placed referral thanks!

## 2019-06-25 NOTE — Telephone Encounter (Signed)
Please ask the patient questions 1-3 prior to scheduling a procedure and COVID test. Once all 3 questions are answered, review the (Agent) Scheduling Recommendations provided in questions 4-6.     1. Have you been diagnosed with COVID in the last 6 weeks?     NO      2. Has someone in your household been diagnosed with COVID in the past 2 weeks?    No      3. Do you currently have any COVID symptoms (headache, sore throat, cough, diarrhea, fever, etc)?    No      (Agent) Scheduling Recommendations:    4. If patient answered “No” to all of the above - Schedule patient for procedure and COVID test following the normal protocol (72-96 hours prior to procedure)      5. If the answers above did not prompt you to route a telephone encounter to [facility], schedule the patient for the procedure and COVID test as recommended.      6. If the answer in question 1 prompted you to not schedule and send this encounter to [facility], fill out a-c and route. DO NOT schedule the patient until recommendations are provided by RN/MD. **Please advise the patient their request will be been sent to the clinical team and they can expect a response within the next 3-5 business days.  a. Date of COVID DX:   b. Symptoms:  c. Was patient hospitalized? If so, provide date of hospitalization:

## 2019-06-25 NOTE — Addendum Note (Signed)
Addended by: Renelda Loma on: 06/25/2019 01:36 PM     Modules accepted: Level of Service

## 2019-06-25 NOTE — Progress Notes (Signed)
Rew Video Visit      ---------------------(data below generated by Pamala Hurry, MD)--------------------    Patient Verification & Telemedicine Consent:    I am proceeding with this evaluation at the direct request of the patient.  I have verified this is the correct patient and have obtained verbal consent and written consent from the patient/ surrogate to perform this voluntary telemedicine evaluation (including obtaining history, performing examination and reviewing data provided by the patient).   The patient/ surrogate has the right to refuse this evaluation.  I have explained risks (including potential loss of confidentiality), benefits, alternatives, and the potential need for subsequent face to face care. Patient/ surrogate understands that there is a risk of medical inaccuracies given that our recommendations will be made based on reported data (and we must therefore assume this information is accurate).  Knowing that there is a risk that this information is not reported accurately, and that the telemedicine video, audio, or data feed may be incomplete, the patient agrees to proceed with evaluation and holds [place] harmless knowing these risks. In this evaluation, we will be providing recommendations only. The patient/ surrogate has been notified that other healthcare professionals (including students, residents and Metallurgist) may be involved in this audio-video evaluation.   All laws concerning confidentiality and patient access to medical records and copies of medical records apply to telemedicine.  The patient/ surrogate has received the [place] Notice of Privacy Practices.  I have reviewed this above verification and consent paragraph with the patient/ surrogate.  If the patient is not capacitated to understand the above, and no surrogate is available, since this is not an emergency evaluation, the visit will be rescheduled until such time that the patient can consent,  or the surrogate is available to consent.    Demographics:   Medical Record #: [id number]   Date: June 25, 2019   Patient Name: Debra Bolton   DOB: [birth year]  Age: 59 year old  Sex: female  Location: Home address on file    Evaluator(s):   Debra Bolton was evaluated by me today.    Clinic Location: [facility]  [address]  [place] [address]    Telemedicine Visit     Debra Bolton is a 59 year old female who is attending a virtual based visit.    Patient consent was acquired.    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     Had rectal bleeding in the past 2 months with BRBPR on toilet paper.  Recently had bleeding on paper, in bowl and around stool. Has 3 formed BMs per day.  Previously had hemorrhoids with pregnancy.  Denies weight loss, abdominal pain.  Has intermittent mild abd discomfort.      Colonoscopy at [facility] 7 years ago.  Told she needed MAC at that procedure. Increased sphincter tone.        No fam history of colon cancer    Review of Systems:  All other ros was performed and neg except as stated in HPI    Past Medical History:  Patient Active Problem List   Diagnosis   . Multiple sclerosis (CMS-HCC)   . IgG4 related disease (CMS-HCC)   . Uveitis of right eye   . 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 Mood disorder (CMS-HCC)   . PTSD (post-traumatic stress  disorder)   . Adequate exercise, at or above goal   . Rotator cuff tear   . Dental caries, unspecified   . Deposits (accretions) on teeth     Medications were reviewed and updated on the Active Medication List   Current Outpatient Medications on File Prior to Visit   Medication Sig Dispense Refill   . albuterol (VENTOLIN HFA) 108 (90 Base) MCG/ACT inhaler Inhale 2 puffs by mouth every 6 hours as needed for Wheezing. 3 Inhaler 3   . Amantadine HCl 100 MG tablet Take 100 mg by mouth 2 times daily. Takes  PRN 60 tablet 0   . baclofen (LIORESAL) 10 MG tablet Take 1 tablet (10 mg) by mouth 3 times daily. (Patient taking differently: Take 10 mg by mouth nightly. ) 90 tablet 11   . diazepam (VALIUM) 5 MG tablet Take 1 tablet (5 mg) by mouth daily as needed for Anxiety. 7 tablet 0   . DULoxetine (CYMBALTA) 60 MG CR capsule Take 2 capsules (120 mg) by mouth daily. 180 capsule 3   . gabapentin (NEURONTIN) 300 MG capsule Take 2 capsules (600 mg) by mouth nightly. 60 capsule 5   . meclizine (ANTIVERT) 25 MG tablet Take 1 tablet (25 mg) by mouth every 8 hours as needed for Dizziness. 30 tablet 0   . nortriptyline (PAMELOR) 25 MG capsule Take 2 capsules (50 mg) by mouth nightly. 180 capsule 3   . thyroid (ARMOUR THYROID) 15 MG tablet Take 1 tablet (15 mg) by mouth every other day. 45 tablet 2   . thyroid (ARMOUR THYROID) 60 MG tablet Take 1 tablet (60 mg) by mouth daily. 90 tablet 2   . tiZANidine (ZANAFLEX) 4 MG tablet Take 1.5 tablets (6 mg) by mouth at bedtime. 135 tablet 3     No current facility-administered medications on file prior to visit.        Psychosocial:  Social History     Tobacco Use   . Smoking status: Former Smoker     Packs/day: 1.00     Years: 15.00     Pack years: 15.00     Types: Cigarettes     Quit date: 01/27/2016     Years since quitting: 3.4   . Smokeless tobacco: Never Used   Substance Use Topics   . Alcohol use: Yes     Alcohol/week: 2.0 standard drinks     Types: 2 Glasses of wine per week     Frequency: 2-4 times a month     Drinks per session: 1 or 2     Binge frequency: Never   . Drug use: Never        Allergies   Allergen Reactions   . Penicillins Rash   . Modafinil Diarrhea and Nausea and Vomiting       The Family History:  Family History   Problem Relation Name Age of Onset   . Other Mother Zara Chess    . Thyroid Mother Zara Chess    . Heart Disease Mother Zara Chess         heart failure   . Cancer Father don mc clain         lung   . Cancer Brother tom mcclain    . Heart Disease  Brother tom mcclain         heart attack   . Cancer Sister Zara Chess         breast   . Diabetes Sister suzanne mcclain    .  Thyroid Sister suzanne mcclain         car accident   . Stroke Brother [place]    . Cancer Brother Jeremy Johann         prostate cancer   . Cancer Brother Jenny Reichmann mc clain         copd lungs       Studies  Na 139 (03/01) CL 103 (03/01) BUN 13 (03/01) GLU   89 (03/01)   K 3.9 (03/01) CO2 27 (03/01) Cr 0.88 (03/01)        WBC 5.3 (03/01) HGB 12.5 (03/01) PLT 280 (03/01)    HCT 38.8 (03/01)        PT 10.7 (03/01) PTT 33 (03/01)   INR 1.0 (03/01)       Imaging:   None    ASSESSMENT AND PLAN  Debra Bolton is a 59 year old female who was provided virtual visit with video visit care delivery.      59 yo woman with history of MS, Igg4 related disease on rituxan who presents for evaluation of rectal bleeding over the past 2 months. Likely anorectal outlet bleeding from hemorrhoids. Last colonoscopy 7 years ago.  Was told to repeat screening in 10 years.  Will need MAC due to poor tolerance of procedure at that time.         The plan was carefully reviewed verbally with the patient and also affirmed that the patient understood next steps and follow up plan.    RETURN TO CLINIC INSTRUCTIONS  Return to clinic prn

## 2019-06-25 NOTE — Patient Instructions (Addendum)
Call [phone] to schedule colonoscopy with MAC anesthesia

## 2019-06-26 ENCOUNTER — Telehealth (HOSPITAL_BASED_OUTPATIENT_CLINIC_OR_DEPARTMENT_OTHER): Payer: Self-pay

## 2019-06-26 ENCOUNTER — Encounter (HOSPITAL_BASED_OUTPATIENT_CLINIC_OR_DEPARTMENT_OTHER): Payer: Self-pay | Admitting: Hospital

## 2019-06-26 NOTE — Telephone Encounter (Signed)
I have attempted to reach the patient and schedule a therapy appointment. I was able to leave a message and provide our main line [phone], so the patient may call us back to schedule at their earliest convenience.    Thank you,  Rehabilitation Services.

## 2019-06-28 DIAGNOSIS — M546 Pain in thoracic spine: Secondary | ICD-10-CM | POA: Diagnosis not present

## 2019-06-28 DIAGNOSIS — Z1389 Encounter for screening for other disorder: Secondary | ICD-10-CM | POA: Diagnosis not present

## 2019-06-28 DIAGNOSIS — M5136 Other intervertebral disc degeneration, lumbar region: Secondary | ICD-10-CM | POA: Diagnosis not present

## 2019-06-28 DIAGNOSIS — Z8781 Personal history of (healed) traumatic fracture: Secondary | ICD-10-CM | POA: Diagnosis not present

## 2019-06-28 DIAGNOSIS — G894 Chronic pain syndrome: Secondary | ICD-10-CM | POA: Diagnosis not present

## 2019-06-29 ENCOUNTER — Encounter (INDEPENDENT_AMBULATORY_CARE_PROVIDER_SITE_OTHER): Payer: Self-pay | Admitting: Hospital

## 2019-07-01 ENCOUNTER — Encounter (INDEPENDENT_AMBULATORY_CARE_PROVIDER_SITE_OTHER): Payer: Self-pay | Admitting: Family Practice

## 2019-07-02 NOTE — Telephone Encounter (Signed)
From: [name]  To: Christin Bach, MD  Sent: 07/01/2019 8:26 PM PDT  Subject: 1-Non Urgent Medical Advice    please tell me we can make a vaccine appt! Any time just set me up   Thank you [name]

## 2019-07-03 ENCOUNTER — Ambulatory Visit (INDEPENDENT_AMBULATORY_CARE_PROVIDER_SITE_OTHER): Payer: Medicare Other

## 2019-07-03 ENCOUNTER — Telehealth (HOSPITAL_BASED_OUTPATIENT_CLINIC_OR_DEPARTMENT_OTHER): Payer: Self-pay | Admitting: Student in an Organized Health Care Education/Training Program

## 2019-07-03 DIAGNOSIS — D72810 Lymphocytopenia: Secondary | ICD-10-CM

## 2019-07-03 DIAGNOSIS — D8989 Other specified disorders involving the immune mechanism, not elsewhere classified: Secondary | ICD-10-CM

## 2019-07-03 DIAGNOSIS — G35 Multiple sclerosis: Secondary | ICD-10-CM

## 2019-07-03 NOTE — Telephone Encounter (Signed)
Debra Bolton is seen by Dr. Rosendo Gros and Dr. Candiss Norse for     1. Multiple Sclerosis, relapsing form   2. Anterior uveitis   3. IgG related lymphadenopathy, chronic sialoadenitis     She is s/p Rituximab 500 mg X 2 doses (07/01/17 & 07/19/17), 1000 mg X 2 (01/02/18 & 01/16/18), 1000 mg X 1 (09/19/2018)     Labs recently completed show the following:  CD27+ CM B cells: 4 cells/uL  CD19: 51 cells/uL    Agreed upon plan is to await SARS-CoV2 vaccination and higher CD27 B cells prior to next dose of rituximab.    Spoke to [place]    She is largely asymptomatic at this time.     She is scheduled for first vaccination on 07/04/19, second on April 7th    She will repeat B cells at minimum 2 weeks after second vaccine and we can decide on redosing at that time.     Labs ordered.     She states understanding of plan

## 2019-07-05 ENCOUNTER — Encounter (INDEPENDENT_AMBULATORY_CARE_PROVIDER_SITE_OTHER): Payer: Self-pay

## 2019-07-05 ENCOUNTER — Encounter (INDEPENDENT_AMBULATORY_CARE_PROVIDER_SITE_OTHER): Payer: Self-pay | Admitting: Hospital

## 2019-07-07 ENCOUNTER — Other Ambulatory Visit: Payer: Self-pay

## 2019-07-09 ENCOUNTER — Ambulatory Visit (INDEPENDENT_AMBULATORY_CARE_PROVIDER_SITE_OTHER): Payer: Medicare Other | Admitting: Ophthalmology

## 2019-07-09 ENCOUNTER — Other Ambulatory Visit (INDEPENDENT_AMBULATORY_CARE_PROVIDER_SITE_OTHER): Payer: Medicare Other | Attending: Neurology

## 2019-07-09 ENCOUNTER — Other Ambulatory Visit (INDEPENDENT_AMBULATORY_CARE_PROVIDER_SITE_OTHER): Payer: Medicare Other

## 2019-07-09 DIAGNOSIS — H25041 Posterior subcapsular polar age-related cataract, right eye: Secondary | ICD-10-CM

## 2019-07-09 DIAGNOSIS — H209 Unspecified iridocyclitis: Secondary | ICD-10-CM

## 2019-07-09 DIAGNOSIS — G35 Multiple sclerosis: Secondary | ICD-10-CM | POA: Insufficient documentation

## 2019-07-09 DIAGNOSIS — D72810 Lymphocytopenia: Secondary | ICD-10-CM | POA: Insufficient documentation

## 2019-07-09 DIAGNOSIS — D8989 Other specified disorders involving the immune mechanism, not elsewhere classified: Secondary | ICD-10-CM | POA: Insufficient documentation

## 2019-07-09 LAB — CBC WITH DIFF, BLOOD
ANC-Automated: 2.8 10*3/uL (ref 1.6–7.0)
Abs Basophils: 0 10*3/uL
Abs Eosinophils: 0.3 10*3/uL (ref 0.0–0.5)
Abs Lymphs: 2.7 10*3/uL (ref 0.8–3.1)
Abs Monos: 0.6 10*3/uL (ref 0.2–0.8)
Basophils: 1 %
Eosinophils: 5 %
Hct: 41.6 % (ref 34.0–45.0)
Hgb: 13.4 gm/dL (ref 11.2–15.7)
Lymphocytes: 42 %
MCH: 29.9 pg (ref 26.0–32.0)
MCHC: 32.2 g/dL (ref 32.0–36.0)
MCV: 92.9 um3 (ref 79.0–95.0)
MPV: 9.9 fL (ref 9.4–12.4)
Monocytes: 10 %
Plt Count: 265 10*3/uL (ref 140–370)
RBC: 4.48 10*6/uL (ref 3.90–5.20)
RDW: 11.9 % — ABNORMAL LOW (ref 12.0–14.0)
Segs: 43 %
WBC: 6.4 10*3/uL (ref 4.0–10.0)

## 2019-07-09 LAB — IMMUNOGLOBULIN PANEL (IGA,IGG,IGM), BLOOD
IGA: 509 mg/dL — ABNORMAL HIGH (ref 70–400)
IGG: 994 mg/dL (ref 700–1600)
IGM: 31 mg/dL — ABNORMAL LOW (ref 40–230)

## 2019-07-09 MED ORDER — PREDNISOLONE ACETATE 1 % OP SUSP
1.0000 [drp] | Freq: Four times a day (QID) | OPHTHALMIC | 2 refills | Status: DC
Start: 2019-07-09 — End: 2020-05-12

## 2019-07-09 NOTE — Assessment & Plan Note (Addendum)
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 Mildly active OD and mildly symptomatic for new floaters and blurriness, last Rituxan dose June 2020, pending repeat Rituxan two weeks after second COVID dose 07/25/19. Start PF qid now OD with weekly taper.    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 High risk cataract surgery with uncertain visual potential, stable, rec observation unless visibly progresses. OCT mac w/o edema ou.

## 2019-07-10 NOTE — Progress Notes (Signed)
H/o of intermediate uveitis OD managed by systemic treatment for MS/IgG4 disease (Rituxan)    H/o of amblyopia OD: poor vision as child.    Posterior polar cataract OD: congenital?    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 Mildly active OD and mildly symptomatic for new floaters and blurriness, last Rituxan dose June 2020, pending repeat Rituxan two weeks after second COVID dose 07/25/19. Start PF qid now OD with weekly taper.    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 High risk cataract surgery with uncertain visual potential, stable, rec observation unless visibly progresses. OCT mac w/o edema ou.

## 2019-07-11 LAB — B CELL SUBSET ANALYSIS
Activated CD21low 38-: 1 cells/uL — ABNORMAL LOW (ref 3–26)
Activated CD21low CD38- %: 1.7 % of CD19 (ref 1.2–9.0)
CD19+ B Cells %: 2.5 % Lymphs — ABNORMAL LOW (ref 6.4–22.0)
CD19+ B Cells: 66 cells/uL — ABNORMAL LOW (ref 110–450)
CD20+ %: 96.7 % of CD19 (ref 96.0–100.0)
CD20+: 64 cells/uL — ABNORMAL LOW (ref 110–450)
Class-switched CD27+IgD-IgM- %: 3.7 % of CD19 — ABNORMAL LOW (ref 5.1–22.0)
Class-switched CD27+IgD-IgM-: 2 cells/uL — ABNORMAL LOW (ref 11–61)
Non Switched CD27+IgD+IgM+ %: 21.3 % of CD19 — ABNORMAL HIGH (ref 2.4–15.0)
Non switched CD27+IgD+IgM: 14 cells/uL (ref 5–46)
Plasmablasts CD38+IgM-: 1 cells/uL (ref 1–8)
Plasmablasts Cd38+IgM- %: 1.2 % of CD19 (ref 0.4–4.1)
Total Memory CD27+ %: 27.6 % of CD19 (ref 10.0–33.0)
Total Memory CD27+: 18 cells/uL — ABNORMAL LOW (ref 23–110)
Transitional CD38+IgM+ %: 23.2 % of CD19 — ABNORMAL HIGH (ref 0.7–5.9)
Transitional CD38+IgM+: 15 cells/uL (ref 1–17)

## 2019-07-11 LAB — IMMUNOGLOBULIN G SUBCLASS 4: IGG Subclass 4: 126 mg/dL — ABNORMAL HIGH (ref 1–123)

## 2019-07-13 ENCOUNTER — Telehealth (INDEPENDENT_AMBULATORY_CARE_PROVIDER_SITE_OTHER): Payer: Self-pay | Admitting: Gastroenterology

## 2019-07-13 NOTE — Telephone Encounter (Signed)
Patient called yesterday to reschedule her procedure after 08/27/2019. The scheduler was unable to move the patient to [place] at the time and advised the patient they would receive a call back.     Left a voicemail message for the patient to call back for further assistance with scheduling her Colonoscopy procedure w/MAC to [facility]. When the patient calls back, please assist with scheduling as triaged. Thank you.

## 2019-07-19 ENCOUNTER — Encounter (INDEPENDENT_AMBULATORY_CARE_PROVIDER_SITE_OTHER): Payer: Medicare Other | Admitting: Interventional Cardiology

## 2019-07-23 ENCOUNTER — Telehealth (INDEPENDENT_AMBULATORY_CARE_PROVIDER_SITE_OTHER): Payer: Medicare Other | Admitting: Internal Medicine

## 2019-07-25 ENCOUNTER — Encounter (INDEPENDENT_AMBULATORY_CARE_PROVIDER_SITE_OTHER): Payer: Self-pay | Admitting: Family Practice

## 2019-07-25 ENCOUNTER — Ambulatory Visit (INDEPENDENT_AMBULATORY_CARE_PROVIDER_SITE_OTHER): Payer: Medicare Other | Admitting: Family Practice

## 2019-07-25 NOTE — Telephone Encounter (Signed)
From: Debra Bolton  To: Christin Bach, MD  Sent: 07/25/2019 2:56 PM PDT  Subject: 2-Procedural Question    Dear Dr.Edi,   I had my first co-vid shot Therapist, music) on March 17th. My second shot was set for today, unfortunately they had issues and I was sent home and told to reschedule. [facility].  I had set up even having someone to drive me...because I did get sick a bit with the first one . I am quite overwhelmed with all of it. I have uveitis in my right eye and cannot get a rutuxin infusion for at least 2 weeks after.  My card 1st dose said Pfizer   [phone]    Any help would be appreciated. Thanks Debra Bolton  .

## 2019-07-26 ENCOUNTER — Other Ambulatory Visit (INDEPENDENT_AMBULATORY_CARE_PROVIDER_SITE_OTHER): Payer: Medicare Other

## 2019-07-27 ENCOUNTER — Telehealth (HOSPITAL_BASED_OUTPATIENT_CLINIC_OR_DEPARTMENT_OTHER): Payer: Self-pay | Admitting: Student in an Organized Health Care Education/Training Program

## 2019-07-27 DIAGNOSIS — D8989 Other specified disorders involving the immune mechanism, not elsewhere classified: Secondary | ICD-10-CM

## 2019-07-27 DIAGNOSIS — G35 Multiple sclerosis: Secondary | ICD-10-CM

## 2019-07-27 DIAGNOSIS — H209 Unspecified iridocyclitis: Secondary | ICD-10-CM

## 2019-07-27 NOTE — Progress Notes (Signed)
See Ophthalmology note 3/22/21and, based on that, our plan is now that we should treat every 6 months if Ig levels okay or consider another treatment.

## 2019-07-27 NOTE — Telephone Encounter (Signed)
Prior plan 3/16 was as follows:  Debra Bolton is seen by Dr. Rosendo Gros and Dr. Candiss Norse for     1. Multiple Sclerosis, relapsing form   2. Anterior uveitis   3. IgG related lymphadenopathy, chronic sialoadenitis     She is s/p Rituximab 500 mg X 2 doses (07/01/17 & 07/19/17), 1000 mg X 2 (01/02/18 & 01/16/18), 1000 mg X 1 (09/19/2018)     Labs recently completed show the following:  CD27+ CM B cells: 4 cells/uL  CD19: 51 cells/uL    Agreed upon plan is to await SARS-CoV2 vaccination and higher CD27 B cells prior to next dose of rituximab.    Spoke to Debra Bolton    She is largely asymptomatic at this time.     She is scheduled for first vaccination on 07/04/19, second on April 7th (t was not able to get)    She will repeat B cells at minimum 2 weeks after second vaccine and we can decide on redosing at that time.     Labs ordered.     She states understanding of plan    Debra Bolton has now seen Dr. 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 Mildly active OD and mildly symptomatic for new floaters and blurriness, last Rituxan dose June 2020, pending repeat Rituxan two weeks after second COVID dose 07/25/19. Start PF qid now OD with weekly taper."    Given this finding we will treat 2 weeks after second vaccine    Per Dr. Rosendo Gros: In the future once immunized we should treat every 6 months if Ig levels okay or consider another treatment     She has ongoing workup for syncope/dizziness with cardiology- will have echo 07/31/19 and follow up 08/09/19. Will schedule infusion for after this.    She is also being scheduled for colonoscopy for workup of rectal bleeding.     Spoke to Debra Bolton - She will receive second vaccine today. She agrees to rituximab treatment around 4/26. Can delay this if workups or other providers feel that is necessary.     She has a trip 4/30- 5/9 to [place]. We mutually decided that treatment prior to her trip would be beneficial so long as she  is planning to practice social distancing, masking, and good precautions during her trip.     Treatment plan entered, not yet signed.    ICSR entered and signed per protocol.

## 2019-07-28 ENCOUNTER — Encounter (INDEPENDENT_AMBULATORY_CARE_PROVIDER_SITE_OTHER): Payer: Self-pay | Admitting: Family Practice

## 2019-07-30 ENCOUNTER — Telehealth (HOSPITAL_BASED_OUTPATIENT_CLINIC_OR_DEPARTMENT_OTHER): Payer: Self-pay | Admitting: Neurology

## 2019-07-30 ENCOUNTER — Other Ambulatory Visit: Payer: Self-pay

## 2019-07-30 NOTE — Telephone Encounter (Signed)
From: Debra Bolton  To: Verl Blalock Edi, MD  Sent: 07/28/2019 11:24 AM PDT  Subject: 1-Non Urgent Medical Advice    I got my second shot for covid yesterday at 12, I am fairly miserable, running a fever and unable to sleep with body aches and headache, I started extra strength tylenol last nite and have been taking it every 8 hours. My last dose was a couple hours ago. My fever is already getting worse, 102.  Can I take anything else? to sleep, and or is advil better? Nyquil?  JUst trying to stay ahead of it. Thanks Debra Bolton

## 2019-07-30 NOTE — Telephone Encounter (Signed)
The infusion center has received your request for: New patient treatment for RITUXAN  Per your request, the available appointment is at: [facility] Infusion center:  Date: 08/10/2019 @11 :00 AM FOR LABS AND INFUSION TO [place] has been Confirmed.    If this appointment is not appropriate please notify me,Sandra Elon Jester    Thank you

## 2019-07-31 ENCOUNTER — Ambulatory Visit (INDEPENDENT_AMBULATORY_CARE_PROVIDER_SITE_OTHER): Payer: Medicare Other

## 2019-07-31 DIAGNOSIS — R42 Dizziness and giddiness: Secondary | ICD-10-CM

## 2019-07-31 LAB — 2D ECHO WITH IMAGE ENHANCEMENT AGENT IF NECESSARY
IVC Diameter: 1.18 cm
LA Volume Index: 26.6 ml/m²
LV Ejection Fraction: 77 %
PA Pressure: 38 mmHg

## 2019-08-01 NOTE — Telephone Encounter (Signed)
Patient was rescheduled to 09/07/2019 with Dr. Truddie Coco at [facility]. Thank you.       Please ask the patient questions 1-3 prior to scheduling a procedure and COVID test. Once all 3 questions are answered, review the (Agent) Scheduling Recommendations provided in questions 4-6.     1. Have you been diagnosed with COVID in the last 6 weeks?     NO      2. Has someone in your household been diagnosed with COVID in the past 2 weeks?    No      3. Do you currently have any COVID symptoms (headache, sore throat, cough, diarrhea, fever, etc)?    No      (Agent) Scheduling Recommendations:    4. If patient answered No to all of the above - Schedule patient for procedure and COVID test following the normal protocol (72-96 hours prior to procedure)      5. If the answers above did not prompt you to route a telephone encounter to [place], schedule the patient for the procedure and COVID test as recommended.      6. If the answer in question 1 prompted you to not schedule and send this encounter to [place], fill out a-c and route. DO NOT schedule the patient until recommendations are provided by RN/MD. **Please advise the patient their request will be been sent to the clinical team and they can expect a response within the next 3-5 business days.  a. Date of COVID DX:   b. Symptoms:  c. Was patient hospitalized? If so, provide date of hospitalization:

## 2019-08-02 DIAGNOSIS — Z1389 Encounter for screening for other disorder: Secondary | ICD-10-CM | POA: Diagnosis not present

## 2019-08-02 DIAGNOSIS — M5136 Other intervertebral disc degeneration, lumbar region: Secondary | ICD-10-CM | POA: Diagnosis not present

## 2019-08-02 DIAGNOSIS — G894 Chronic pain syndrome: Secondary | ICD-10-CM | POA: Diagnosis not present

## 2019-08-02 DIAGNOSIS — M546 Pain in thoracic spine: Secondary | ICD-10-CM | POA: Diagnosis not present

## 2019-08-02 DIAGNOSIS — Z8781 Personal history of (healed) traumatic fracture: Secondary | ICD-10-CM | POA: Diagnosis not present

## 2019-08-03 ENCOUNTER — Telehealth (INDEPENDENT_AMBULATORY_CARE_PROVIDER_SITE_OTHER): Payer: Medicare Other

## 2019-08-06 ENCOUNTER — Encounter (INDEPENDENT_AMBULATORY_CARE_PROVIDER_SITE_OTHER): Payer: Self-pay | Admitting: Internal Medicine

## 2019-08-06 ENCOUNTER — Telehealth (INDEPENDENT_AMBULATORY_CARE_PROVIDER_SITE_OTHER): Payer: Medicare Other | Admitting: Internal Medicine

## 2019-08-06 DIAGNOSIS — M5416 Radiculopathy, lumbar region: Secondary | ICD-10-CM | POA: Diagnosis not present

## 2019-08-06 DIAGNOSIS — M48061 Spinal stenosis, lumbar region without neurogenic claudication: Secondary | ICD-10-CM | POA: Diagnosis not present

## 2019-08-06 DIAGNOSIS — M545 Low back pain: Secondary | ICD-10-CM | POA: Diagnosis not present

## 2019-08-06 DIAGNOSIS — K1120 Sialoadenitis, unspecified: Secondary | ICD-10-CM

## 2019-08-06 DIAGNOSIS — M2550 Pain in unspecified joint: Secondary | ICD-10-CM

## 2019-08-06 DIAGNOSIS — G35 Multiple sclerosis: Secondary | ICD-10-CM

## 2019-08-06 DIAGNOSIS — R1013 Epigastric pain: Secondary | ICD-10-CM

## 2019-08-06 DIAGNOSIS — H209 Unspecified iridocyclitis: Secondary | ICD-10-CM

## 2019-08-06 DIAGNOSIS — D8989 Other specified disorders involving the immune mechanism, not elsewhere classified: Secondary | ICD-10-CM

## 2019-08-06 DIAGNOSIS — M7551 Bursitis of right shoulder: Secondary | ICD-10-CM

## 2019-08-06 DIAGNOSIS — Z79899 Other long term (current) drug therapy: Secondary | ICD-10-CM

## 2019-08-06 DIAGNOSIS — R590 Localized enlarged lymph nodes: Secondary | ICD-10-CM

## 2019-08-06 NOTE — Progress Notes (Signed)
Due to the COVID-19 pandemic and high risk of community-acquired infection with patient travel, I am proceeding with this telemedicine evaluation at the request of the patient.     Patient Verification & Telemedicine Consent:    I am proceeding with this evaluation at the direct request of the patient.  I have verified this is the correct patient and have obtained verbal consent and written consent from the patient/ surrogate to perform this voluntary telemedicine evaluation (including obtaining history, performing examination and reviewing data provided by the patient).   The patient/ surrogate has the right to refuse this evaluation.  I have explained risks (including potential loss of confidentiality), benefits, alternatives, and the potential need for subsequent face to face care. Patient/ surrogate understands that there is a risk of medical inaccuracies given that our recommendations will be made based on reported data (and we must therefore assume this information is accurate).  Knowing that there is a risk that this information is not reported accurately, and that the telemedicine video, audio, or data feed may be incomplete, the patient agrees to proceed with evaluation and holds [place] harmless knowing these risks. In this evaluation, we will be providing recommendations only. The patient/ surrogate has been notified that other healthcare professionals (including students, residents and Metallurgist) may be involved in this audio-video evaluation.   All laws concerning confidentiality and patient access to medical records and copies of medical records apply to telemedicine.  The patient/ surrogate has received the [place] Notice of Privacy Practices.  I have reviewed this above verification and consent paragraph with the patient/ surrogate.  If the patient is not capacitated to understand the above, and no surrogate is available, since this is not an emergency evaluation, the visit will be rescheduled  until such time that the patient can consent, or the surrogate is available to consent.    Demographics:   Medical Record #: [id number]   Date: August 06, 2019   Patient Name: Debra Bolton   DOB: May 09, 1960  Age: 59 year old  Sex: female  Location: Home address on file    Evaluator(s):   Keasha Crihfield was evaluated by me today.    Clinic Location: [facility]  [address]  [place] [address]    Chief Complaint  Chief Complaint   Patient presents with    Follow Up   1. Multiple Sclerosis, relapsing form  2. Anterior uveitis  3. IgG related lymphadenopathy, chronic sialoadenitis    History of present Illness  59 year old female with history of multiple sclerosis diagnosed 1991, presented with numbness in both legs, seizures of 4 years duration.  Multiple relapses since then.  Previously treated with Copaxone.  Started Natalizumab in 2014 with good response and improvement in pain and numbness in the extremities.  Currently taking Cymbalta and gabapentin which appear to help with pain in her extremities.    History of anterior uveitis in 2018, treated with topical corticosteroids.  Seen by Ophthalmology at [facility].     She is a chronic heavy smoker, around 2017, developed change in voice with cervical lymphadenopathy.  Underwent removal of enlarged tonsil and cervical lymph node to rule out malignancy.  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  CT chest abdomen pelvis 08/11/2015 which was negative for lymphadenopathy and for retroperitoneal fibrosis.  No pancreatitis.  Neck ultrasound showed bilateral submandibular  gland enlargement with heterogeneous echotexture, likely secondary to IgG4 related disease    Switched from natalizumab to rituximab for multiple sclerosis, in light of history of uveitis and IgG4 related disease, by Neurology.  Received rituximab 500 mg IV 2 doses 2  weeks apart in March 2019 and September 2019  1 g IV single dose June 2020    Remains on rituximab every 6 months, however delayed last dose d/t pandemic . Tolerating rituximab well.  Overall felt better with respect to energy levels, submandibular pain and swelling after the infusion.  Abdominal pain has resolved.     Recently had recurrence of uveitis in 06/2019, treated with pred forte.    Mild arthralgias, fatigue, voice hoarseness, submandibular swelling.  No significant progression since last visit.   Denies fever and weight loss.  Notes mild diarrhea, mid lower back pain, usually postprandial.  Mild, intermittent epigastric pain    Recurrent right subacromial bursitis status post bursa injection on multiple occasions with good response.  Significant tendinopathy on most recent right shoulder MRI.  Shoulder pain stable since last visit    No unexpected appetite or weight changes.  No persistent fever.      Review of Systems  As per HPI    Medications and Allergies were reviewed with the patient.       Current Outpatient Medications:     albuterol (VENTOLIN HFA) 108 (90 Base) MCG/ACT inhaler, Inhale 2 puffs by mouth every 6 hours as needed for Wheezing., Disp: 3 Inhaler, Rfl: 3    Amantadine HCl 100 MG tablet, Take 100 mg by mouth 2 times daily. Takes PRN, Disp: 60 tablet, Rfl: 0    baclofen (LIORESAL) 10 MG tablet, Take 1 tablet (10 mg) by mouth 3 times daily. (Patient taking differently: Take 10 mg by mouth nightly. ), Disp: 90 tablet, Rfl: 11    diazepam (VALIUM) 5 MG tablet, Take 1 tablet (5 mg) by mouth daily as needed for Anxiety., Disp: 7 tablet, Rfl: 0    DULoxetine (CYMBALTA) 60 MG CR capsule, Take 2 capsules (120 mg) by mouth daily., Disp: 180 capsule, Rfl: 3    gabapentin (NEURONTIN) 300 MG capsule, Take 2 capsules (600 mg) by mouth nightly., Disp: 60 capsule, Rfl: 5    meclizine (ANTIVERT) 25 MG tablet, Take 1 tablet (25 mg) by mouth every 8 hours as needed for Dizziness., Disp: 30 tablet,  Rfl: 0    nortriptyline (PAMELOR) 25 MG capsule, Take 2 capsules (50 mg) by mouth nightly., Disp: 180 capsule, Rfl: 3    prednisoLONE acetate (PRED FORTE) 1 % ophthalmic suspension, Place 1 drop into right eye 4 times daily. For two weeks then tid for one week then bid for one week then daily for one week then stop., Disp: 1 bottle, Rfl: 2    thyroid (ARMOUR THYROID) 15 MG tablet, Take 1 tablet (15 mg) by mouth every other day., Disp: 45 tablet, Rfl: 2    thyroid (ARMOUR THYROID) 60 MG tablet, Take 1 tablet (60 mg) by mouth daily., Disp: 90 tablet, Rfl: 2    tiZANidine (ZANAFLEX) 4 MG tablet, Take 1.5 tablets (6 mg) by mouth at bedtime., Disp: 135 tablet, Rfl: 3    Allergies  Penicillins and Modafinil    Past Medical History:   Diagnosis Date    Asthma 1991    with pregnancy    Hypothyroidism     Major depressive disorder, single episode     MS (multiple sclerosis) (CMS-HCC)  Past Surgical History:   Procedure Laterality Date    leep      for cervical dysplasia    LYMPH NODE BIOPSY      IgG4 related disease    TONSILLECTOMY AND ADENOIDECTOMY      only tonsillectomy       Family History   Problem Relation Name Age of Onset    Other Mother Zara Chess     Thyroid Mother Zara Chess     Heart Disease Mother Velva Harman mcclain         heart failure    Cancer Father don mc clain         lung    Cancer Brother tom mcclain     Heart Disease Brother tom mcclain         heart attack    Cancer Sister Velva Harman mcclain         breast    Diabetes Sister suzanne mcclain     Thyroid Sister suzanne mcclain         car accident    Stroke Brother Don mcclain     Cancer Brother Don mcclain         prostate cancer    Cancer Brother john mc clain         copd lungs     Social History     Tobacco Use    Smoking status: Former Smoker     Packs/day: 1.00     Years: 15.00     Pack years: 15.00     Types: Cigarettes     Quit date: 01/27/2016     Years since quitting: 3.5    Smokeless tobacco: Never Used   Substance  Use Topics    Alcohol use: Yes     Alcohol/week: 2.0 standard drinks     Types: 2 Glasses of wine per week     Frequency: 2-4 times a month     Drinks per session: 1 or 2     Binge frequency: Never    Drug use: Never         Physical Exam  There were no vitals filed for this visit.  Gen: Alert, cooperative  Psych: Appropriate affect, pleasant  Neck: No visibly enlarged lymph nodes  Skin: No rheumatologic rashes or ulcers  Eyes: EOMI. No scleral icterus  ENT: No oral mucositis  Resp: Breathing comfortably, no tachypnea  Cardiac: Well perfused extremities, no cyanosis  Joint exam:  No visible joint swelling  Normal fist formation  Good range of motion in shoulder and pelvic girdles and remaining joints  Neuro: Oriented to time, place and person         Most recent labs and imaging were reviewed and discussed with the patient.  50 pages of outside records reviewed, outlined in HPI    08/08/2015  IgG4 level 215 upper limit 127m/dl  Total IgG 1050 upper limit 1590    01/12/17  IgG4 level 193  03/29/17:  IgG4 elevated at 205.  Normal IgG    03/2018 IgG4 163, normal IgG     Most recent labs reviewed with the patient  IgG4 135    Impression    ICD-10-CM ICD-9-CM   1. IgG4 related disease (CMS-HCC)  D89.89 279.8   2. Multiple sclerosis (CMS-HCC)  G35 340   3. High risk medication use  Z79.899 V58.69   4. Sialadenitis  K11.20 527.2   5. Lymphadenopathy, cervical  R59.0 785.6   6. Uveitis of right eye  H20.9 364.3   7. Epigastric pain  R10.13 789.06   8. Arthralgia, unspecified joint  M25.50 719.40   9. Subacromial bursitis of right shoulder joint  M75.51 726.19       Japji Huss is a 59 year old female with multiple sclerosis, followed by Neurology.  Previously well controlled on treatment with natalizumab.    Switched to rituximab in March 2019 due to history of IgG4 related disease, with cervical lymphadenopathy, submandibular gland enlargement and recurrent uveitis of right eye.  Chronic nerve pain well controlled with  Cymbalta and gabapentin.    IgG4 related disease on histopathology of enlarged right cervical lymph nodes.  No pancreatitis, no other systemic lymphadenopathy on CT chest abdomen pelvis performed April 2017.    History 2 episodes of anterior uveitis in 2018, no recurrence.  Negative ANA HLA B27, Qft TB    Last rituximab June 2020. Persistently elevated IgG4 levels, previously asymptomatic.  Recent recurrence of uveitis, plan to repeat rituximab    PLAN:   repeat rituximab 1 g single dose   continue physical therapy for right rotator cuff tendinopathy   Treatment of uveitis with topical steroids per ophthalmology    Orders Placed This Encounter   Procedures    IgG Subclass, Blood Yellow serum separator tube    IgE, Blood - See Instructions    Sedimentation Rate (ESR), Blood Lavender    C-Reactive Protein, Blood Green Plasma Separator Tube        Return in about 3 months (around 11/05/2019).     I personally spent total 30 minutes in face-to-face and non-face-to-face activities related to the patients visit today, excluding any separately reportable services/procedures.

## 2019-08-09 ENCOUNTER — Encounter (INDEPENDENT_AMBULATORY_CARE_PROVIDER_SITE_OTHER): Payer: Self-pay | Admitting: Interventional Cardiology

## 2019-08-09 ENCOUNTER — Ambulatory Visit (INDEPENDENT_AMBULATORY_CARE_PROVIDER_SITE_OTHER): Payer: Medicare Other | Admitting: Interventional Cardiology

## 2019-08-09 VITALS — BP 124/82 | HR 78 | Temp 98.2°F | Resp 18 | Wt 135.0 lb

## 2019-08-09 DIAGNOSIS — E785 Hyperlipidemia, unspecified: Secondary | ICD-10-CM

## 2019-08-09 DIAGNOSIS — E854 Organ-limited amyloidosis: Secondary | ICD-10-CM

## 2019-08-09 DIAGNOSIS — I43 Cardiomyopathy in diseases classified elsewhere: Secondary | ICD-10-CM

## 2019-08-09 DIAGNOSIS — R079 Chest pain, unspecified: Secondary | ICD-10-CM

## 2019-08-09 DIAGNOSIS — I5189 Other ill-defined heart diseases: Secondary | ICD-10-CM

## 2019-08-09 MED ORDER — METOPROLOL SUCCINATE 25 MG OR TB24
25.0000 mg | ORAL_TABLET | Freq: Every day | ORAL | 0 refills | Status: DC
Start: 2019-08-09 — End: 2019-09-04

## 2019-08-09 NOTE — Telephone Encounter (Signed)
Orders signed, she will see cardiology later today and scheduled for infusion tomorrow 08/10/19

## 2019-08-09 NOTE — Addendum Note (Signed)
Addended by: Bosie Clos on: 08/09/2019 10:49 AM     Modules accepted: Orders

## 2019-08-09 NOTE — Progress Notes (Signed)
CARDIOLOGY CLINIC FOLLOW UP VISIT NOTE    DATE of VISIT: 08/09/19  REASON FOR VISIT: Echo, monitor follow up     HPI:  59 year old female with h/o of relapsing MS now in remission, IgG4 related disease complicated by Uveitis and cervical adenopathy on rituximab for both, PTSD, depression, anxiety and insomnia who is referred to [place] originally for dizziness evaluation. She is here for follow on echo and monitor results.     Echo showed dynamic LVOT obstruction and mild LVH. 1 week Event monitor was essentially normal. Carotid duplex also normal. Since last visit she denies dizziness event.    Patient reported 6 falls for the last 9 months some of them were in restroom when she feels dizzy. One time after reaching out to toilet tissue paper she stumped forward and hit her head. She is been almost daily feeling dizzy when standing up. She felt on rare occasions heart racing feeling but does not correlate with dizziness. She does have known vertigo but this is different. She denies dizziness when exercising or sitting down, another pre-syncope event she reports is when she was getting out of bathtub.  She also reports very rare right chest bone pain that is  non exertional.  She exercise well and practice Yoga on zoom multiple times per week. She also run/walk on treadmill almost 5/week each for 45 min without issues.    ROS: As per HPI, otherwise the remaining 12-point  ROS was reviewed and is negative.    PMH:  Patient Active Problem List    Diagnosis Date Noted    Rectal bleeding 06/25/2019     Added automatically from request for surgery [id number]      Dental caries, unspecified 05/23/2019    Deposits (accretions) on teeth 05/23/2019    Adequate exercise, at or above goal 10/16/2018    Rotator cuff tear 05/07/2018    Mood disorder (CMS-HCC) 03/29/2017    PTSD (post-traumatic stress disorder) 03/29/2017    Uveitis of right eye 02/04/2017     H/o of intermediate uveitis OD managed by systemic treatment for  MS/IgG4 disease (Rituxan)    H/o of amblyopia OD: poor vision as child.    Posterior polar cataract OD: congenital?      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    Multiple sclerosis (CMS-HCC) 12/10/2015     Past Medical History:   Diagnosis Date    Asthma 1991    with pregnancy    Hypothyroidism     Major depressive disorder, single episode     MS (multiple sclerosis) (CMS-HCC)        SH:  Social History     Tobacco Use    Smoking status: Former Smoker     Packs/day: 1.00     Years: 15.00     Pack years: 15.00     Types: Cigarettes     Quit date: 01/27/2016     Years since quitting: 3.5    Smokeless tobacco: Never Used   Substance Use Topics    Alcohol use: Yes     Alcohol/week: 2.0 standard drinks     Types: 2 Glasses of wine per week     Frequency: 2-4 times a month     Drinks per session: 1 or 2     Binge frequency: Never    Drug use: Never     Smoking for total of 20 years , one pack per day.  Quit 10 years ago.     FH:  family history includes Cancer in her brother, brother, brother, father, and sister; Diabetes in her sister; Heart Disease in her brother and mother; Other in her mother; Stroke in her brother; Thyroid in her mother and sister.     Brother MI in 50's   Another brother stroke - 84     Meds:  Outpatient Medications Marked as Taking for the 08/09/19 encounter (Office Visit) with Al Khiami, Neta Ehlers, MD   Medication Sig Dispense Refill    albuterol (VENTOLIN HFA) 108 (90 Base) MCG/ACT inhaler Inhale 2 puffs by mouth every 6 hours as needed for Wheezing. 3 Inhaler 3    Amantadine HCl 100 MG tablet Take 100 mg by mouth 2 times daily. Takes PRN 60 tablet 0    baclofen (LIORESAL) 10 MG tablet Take 1 tablet (10 mg) by mouth 3 times daily. (Patient taking differently: Take 10 mg by mouth nightly. ) 90 tablet 11    diazepam (VALIUM) 5 MG tablet Take 1 tablet (5 mg) by mouth daily as needed for Anxiety. 7 tablet 0     DULoxetine (CYMBALTA) 60 MG CR capsule Take 2 capsules (120 mg) by mouth daily. 180 capsule 3    gabapentin (NEURONTIN) 300 MG capsule Take 2 capsules (600 mg) by mouth nightly. 60 capsule 5    meclizine (ANTIVERT) 25 MG tablet Take 1 tablet (25 mg) by mouth every 8 hours as needed for Dizziness. 30 tablet 0    nortriptyline (PAMELOR) 25 MG capsule Take 2 capsules (50 mg) by mouth nightly. 180 capsule 3    prednisoLONE acetate (PRED FORTE) 1 % ophthalmic suspension Place 1 drop into right eye 4 times daily. For two weeks then tid for one week then bid for one week then daily for one week then stop. 1 bottle 2    thyroid (ARMOUR THYROID) 15 MG tablet Take 1 tablet (15 mg) by mouth every other day. 45 tablet 2    thyroid (ARMOUR THYROID) 60 MG tablet Take 1 tablet (60 mg) by mouth daily. 90 tablet 2    tiZANidine (ZANAFLEX) 4 MG tablet Take 1.5 tablets (6 mg) by mouth at bedtime. 135 tablet 3       ALLERGIES:   Allergies   Allergen Reactions    Penicillins Rash    Modafinil Diarrhea and Nausea and Vomiting         PHYSICAL EXAM:  Vitals:    08/09/19 1254   BP: 124/82   BP Location: Left arm   BP Patient Position: Sitting   BP cuff size: Regular   Pulse: 78   Resp: 18   Temp: 98.2 F (36.8 C)   TempSrc: Temporal   SpO2: 99%   Weight: 61.2 kg (135 lb)     General: A&O x3, No apparent distress.  HEENT: PERRLA, EOMI, OP clear no evidence of thrush or exudate.   Neck: Supple, No JVD, no carotid bruit bilaterally, no cervical lymphadenopathy  Pul: Symmetric expansion of chest, CTAB, no wheezes, rhonci, or crackles  CV: Regular rate and rhythm, normal S1S2. No murmurs, rubs or gallops.   Abd: Soft, non-tender, normal apparent bowel sounds in all four quadrants. No rebound or guarding.  Ext: Warm and well perfused bilateral lower extremities. No clubbing, cyanosis, or edema.  + PT and DP pulses bilaterally  Neuro: CN II-XII grossly intact. No focal deficits are appreciated. No motor or sensory deficits noted.  Skin:  no rashes, lesions, erythema,  or ecchymosis.    Lab Results   Component Value Date    CHOL 231 11/18/2017    HDL 98 11/18/2017    LDLCALC 116 11/18/2017    TRIG 86 11/18/2017     Lab Results   Component Value Date    NA 139 08/10/2019    K 4.2 08/10/2019    CL 102 08/10/2019    BICARB 27 08/10/2019    BUN 20 08/10/2019    CREAT 0.93 08/10/2019    GLU 80 08/10/2019    [place] 10.0 08/10/2019     Lab Results   Component Value Date    WBC 6.7 08/10/2019    RBC 4.50 08/10/2019    HGB 13.6 08/10/2019    HCT 40.1 08/10/2019    MCV 89.1 08/10/2019    MCHC 33.9 08/10/2019    RDW 11.8 (L) 08/10/2019    PLT 305 08/10/2019    MPV 9.0 (L) 08/10/2019         STUDIES:  ECG 06/15/2019- (personally interpreted): NSR     Echo 2021  1. The left ventricular size is normal. The left ventricular systolic function is hyperdynamic.   2. Mild concentric left ventricular hypertrophy.   3. Mild or grade I (impaired relaxation pattern) left ventricular diastolic filling.   4. A dynamic LV outflow gradient is present 1.65 M/sec (11 mmHg) increasing to 2.4 M/sec (23 mmHg) with valsalva.   5. The right ventricular size is normal and systolic function is normal.   6. Mild pulmonary hypertension with right ventricular systolic pressure measuring 38 mmHg.   7. No previous study for comparison.    Event monitor 3/13/-07/06/2019- uploaded to media, not finally read yet (personally reviewed)   No arrhythmia noted, PAC<1 %    Carotid duplex 06/2019  No evidence of hemodynamically significant stenosis.    Assessment and Plan:    59 year old female with h/o of relapsing MS now in remission, IgG4 related disease complicated by Uveitis and cervical adenopathy on rituximab for both, PTSD, depression who is referred to [place] for dizziness and syncope     **LVOT dynamic outflow obstruction: mild with very mild LVH (IVSd 1.1 cm). No family history of HCM. Dizziness could  Be related to this. Given known autoimmune disease, will proceed with ruling our infiltrative heart  disease like amyloid although less likely.   -start metoprolol 25 mg XL daily   -Counseled patient about the importance of good hydration and avoiding hot environment   -Exercise stress echo for risk stratification and to r/o CAD.   -Pyrophosphate SPECT study to rule out possible ATTR  -Plasma Free light chain level/ratio, Serum and urine Immunofixation     **Syncope/ pre-syncope/dizziness: this appears to be postural and different from her central vertigo. Syncope appeared to be consistent with common faint/vasovagal. Echo later showed LVOT dynamic obstruction which could explain some of her dizziness. Event monitor without arrhythmia.  Her postural symptoms could be due also to her progressive MS. Her orthostatics were not significant.  Carotid duplex normal.  -Lifestyle modifications, hydration, increase salt intake   -BB as above to decrease LVOT obstruction   -Below knee Compression stocking - low to intermediate level when active   -Consider EP referral for syncope work up.     **Family history of premature CAD. She is asymptomatic. Very rare chest pain appears to be not typical. Patient has good functional activity without cp. Hx of previous smoking 20 pack year.    -Stress echo   -  Consider repeating lipid  Panel.     The 10-year ASCVD risk score Mikey Bussing DC Brooke Bonito., et al., 2013) is: 2.2%    Values used to calculate the score:      Age: 10 years      Sex: Female      Is Non-Hispanic African American: No      Diabetic: No      Tobacco smoker: No      Systolic Blood Pressure: 123XX123 mmHg      Is BP treated: No      HDL Cholesterol: 98 mg/dL      Total Cholesterol: 231 mg/dL      **Multiple sclerosis and IGg4 disease: Patient should be ok to receive Rituximab dose.   -Advise good hydration   -Follow up with neurology and rheumatology in [place]     RTC in 6 weeks     Seleta Rhymes, MD, [facility], [facility]  Assistant Professor of Medicine   Interventional Cardiology    [facility] health   Pager [phone]

## 2019-08-10 ENCOUNTER — Ambulatory Visit (HOSPITAL_BASED_OUTPATIENT_CLINIC_OR_DEPARTMENT_OTHER): Admit: 2019-08-10 | Discharge: 2019-08-13 | Payer: Medicare Other

## 2019-08-10 ENCOUNTER — Ambulatory Visit
Admission: RE | Admit: 2019-08-10 | Discharge: 2019-08-13 | Disposition: A | Discharge status: Home / Self Care | Payer: Medicare Other | Attending: Neurology | Admitting: Neurology

## 2019-08-10 VITALS — BP 116/75 | HR 75 | Temp 98.2°F | Resp 16 | Ht 64.0 in | Wt 135.1 lb

## 2019-08-10 VITALS — BP 121/82 | HR 79 | Temp 97.6°F | Resp 16

## 2019-08-10 DIAGNOSIS — R079 Chest pain, unspecified: Secondary | ICD-10-CM

## 2019-08-10 DIAGNOSIS — E854 Organ-limited amyloidosis: Secondary | ICD-10-CM

## 2019-08-10 DIAGNOSIS — I5189 Other ill-defined heart diseases: Secondary | ICD-10-CM

## 2019-08-10 DIAGNOSIS — I43 Cardiomyopathy in diseases classified elsewhere: Secondary | ICD-10-CM

## 2019-08-10 DIAGNOSIS — D8989 Other specified disorders involving the immune mechanism, not elsewhere classified: Secondary | ICD-10-CM | POA: Insufficient documentation

## 2019-08-10 DIAGNOSIS — R799 Abnormal finding of blood chemistry, unspecified: Secondary | ICD-10-CM | POA: Insufficient documentation

## 2019-08-10 DIAGNOSIS — H209 Unspecified iridocyclitis: Secondary | ICD-10-CM | POA: Insufficient documentation

## 2019-08-10 DIAGNOSIS — G35 Multiple sclerosis: Secondary | ICD-10-CM

## 2019-08-10 DIAGNOSIS — E785 Hyperlipidemia, unspecified: Secondary | ICD-10-CM

## 2019-08-10 DIAGNOSIS — Z79899 Other long term (current) drug therapy: Secondary | ICD-10-CM | POA: Insufficient documentation

## 2019-08-10 DIAGNOSIS — Z5112 Encounter for antineoplastic immunotherapy: Secondary | ICD-10-CM | POA: Insufficient documentation

## 2019-08-10 LAB — COMPREHENSIVE METABOLIC PANEL, BLOOD
ALT (SGPT): 21 U/L (ref 0–33)
AST (SGOT): 27 U/L (ref 0–32)
Albumin: 4.7 g/dL (ref 3.5–5.2)
Alkaline Phos: 60 U/L (ref 35–140)
Anion Gap: 10 mmol/L (ref 7–15)
BUN: 20 mg/dL (ref 6–20)
Bicarbonate: 27 mmol/L (ref 22–29)
Bilirubin, Tot: 0.33 mg/dL (ref <1.2)
Calcium: 10 mg/dL (ref 8.5–10.6)
Chloride: 102 mmol/L (ref 98–107)
Creatinine: 0.93 mg/dL (ref 0.51–0.95)
GFR: >60 mL/min
Glucose: 80 mg/dL (ref 70–99)
Potassium: 4.2 mmol/L (ref 3.5–5.1)
Sodium: 139 mmol/L (ref 136–145)
Total Protein: 7.7 g/dL (ref 6.0–8.0)

## 2019-08-10 LAB — KAPPA LAMBDA FREE LIGHT CHAIN W/ RATIO, BLOOD
Kappa Free Light Chains Quant: 14.9 mg/L (ref 3.3–19.4)
Kappa:Lambda Free Light Chain Ratio: 1.2 (ref 0.26–1.65)
Lambda Free Light Chains Quant: 12.4 mg/L (ref 5.7–26.3)

## 2019-08-10 LAB — CBC WITH DIFF, BLOOD
ANC-Automated: 3.6 10*3/uL (ref 1.6–7.0)
Abs Basophils: 0.1 10*3/uL
Abs Eosinophils: 0.2 10*3/uL (ref 0.0–0.5)
Abs Lymphs: 2.1 10*3/uL (ref 0.8–3.1)
Abs Monos: 0.7 10*3/uL (ref 0.2–0.8)
Basophils: 1 %
Eosinophils: 4 %
Hct: 40.1 % (ref 34.0–45.0)
Hgb: 13.6 gm/dL (ref 11.2–15.7)
Lymphocytes: 31 %
MCH: 30.2 pg (ref 26.0–32.0)
MCHC: 33.9 g/dL (ref 32.0–36.0)
MCV: 89.1 um3 (ref 79.0–95.0)
MPV: 9 fL — ABNORMAL LOW (ref 9.4–12.4)
Monocytes: 10 %
Plt Count: 305 10*3/uL (ref 140–370)
RBC: 4.5 10*6/uL (ref 3.90–5.20)
RDW: 11.8 % — ABNORMAL LOW (ref 12.0–14.0)
Segs: 55 %
WBC: 6.7 10*3/uL (ref 4.0–10.0)

## 2019-08-10 LAB — IBC - IRON BINDING CAPACITY
Iron Saturation: 19 %
Iron: 68 ug/dL (ref 37–145)
Total IBC: 354 ug/dL (ref 148–506)
UIBC: 286 ug/dL (ref 112–346)

## 2019-08-10 LAB — IGE, BLOOD: IGE: 105 [IU]/mL — ABNORMAL HIGH (ref 0–99)

## 2019-08-10 LAB — C-REACTIVE PROTEIN, BLOOD: CRP: 0.09 mg/dL (ref <0.5)

## 2019-08-10 LAB — FERRITIN, BLOOD: Ferritin: 73 ng/mL (ref 13–150)

## 2019-08-10 LAB — SED RATE, BLOOD: Sed Rate: 7 mm/hr (ref 0–30)

## 2019-08-10 MED ORDER — METHYLPREDNISOLONE SODIUM SUCC 125 MG IJ SOLR CUSTOM
100.0000 mg | Freq: Once | INTRAMUSCULAR | Status: AC
Start: 2019-08-10 — End: 2019-08-10
  Administered 2019-08-10 (×2): 100 mg via INTRAVENOUS
  Filled 2019-08-10: qty 125

## 2019-08-10 MED ORDER — ACETAMINOPHEN 325 MG PO TABS
650.0000 mg | ORAL_TABLET | Freq: Once | ORAL | Status: AC
Start: 2019-08-10 — End: 2019-08-10
  Administered 2019-08-10: 650 mg via ORAL
  Filled 2019-08-10: qty 2

## 2019-08-10 MED ORDER — SODIUM CHLORIDE 0.9 % IV SOLN
INTRAVENOUS | Status: AC
Start: 2019-08-10 — End: 2019-08-10

## 2019-08-10 MED ORDER — SODIUM CHLORIDE 0.9 % IV SOLN
1000.0000 mg | Freq: Once | INTRAVENOUS | Status: AC
Start: 2019-08-10 — End: 2019-08-10
  Administered 2019-08-10 (×2): 1000 mg via INTRAVENOUS
  Filled 2019-08-10: qty 100

## 2019-08-10 MED ORDER — DIPHENHYDRAMINE HCL 50 MG/ML IJ SOLN
25.0000 mg | Freq: Once | INTRAMUSCULAR | Status: AC
Start: 2019-08-10 — End: 2019-08-10
  Administered 2019-08-10: 25 mg via INTRAVENOUS
  Filled 2019-08-10: qty 1

## 2019-08-10 NOTE — Interdisciplinary (Signed)
Patient arrived ambulatory in stable condition. Denies nausea, vomiting, diarrhea, or constipation. Denies fevers or recent illness.     PIV started to: L AC                      Positive blood return. #1 attempt. Flushed with 2ml NS and capped.   Labs drawn: Yes / 24 hour UA cancelled per patient request. She will be able to do this test at a later time.     Patient discharged ambulatory in stable condition to lobby awaiting lab results. Charge Triage RN to review labs.

## 2019-08-10 NOTE — Interdisciplinary (Signed)
Patient arrived ambulatory in stable condition. Denies nausea, vomiting, diarrhea, or constipation. Denies fevers or recent illness.     PIV started to: L AC                      Positive blood return. #1 attempt. Flushed with 85ml NS and capped.   Labs drawn: Yes / 24 hour UA cancelled per patient request. She will be able to do this test at a later time.     Patient discharged ambulatory in stable condition to lobby awaiting lab results. Charge Triage RN to review labs.

## 2019-08-10 NOTE — Interdisciplinary (Signed)
Non-Chemotherapy Infusion Nursing Note - [place] is a 59 year old female who presents for infusion of Rituximab 1000mg .    Vitals:    08/10/19 1700 08/10/19 1730 08/10/19 1800 08/10/19 1851   BP: 123/70 126/74 121/80 121/82   BP Location: Right arm Right arm Right arm Right arm   BP Patient Position: Sitting Sitting Sitting Sitting   Pulse: 72 73 79 79   Resp: 16 16 16 16    Temp:    97.6 F (36.4 C)   TempSrc:    Temporal   SpO2: 97% 96%  97%     Pre-treatment nursing assessment:  No problems identified upon assessment. Good blood return from 22G PIV in left AC prior to infusion. No signs of infiltration/phlebitis.    Medications   sodium chloride 0.9% infusion ( IntraVENOUS Completed 08/10/19 1853)   acetaminophen (TYLENOL) tablet 650 mg (650 mg Oral Given 08/10/19 1322)   diphenhydrAMINE (BENADRYL) injection 25 mg (25 mg IntraVENOUS Given 08/10/19 1327)   methylPREDNISolone sodium succinate (SOLU-MEDROL) injection 100 mg (100 mg IntraVENOUS Given 08/10/19 1330)   riTUXimab (RITUXAN) 1,000 mg in sodium chloride 0.9 % 1,000 mL infusion (0 mg IntraVENOUS Completed 08/10/19 1850)     Debra Bolton tolerated treatment well.  No adverse events observed or reported. Rituximab titrated as ordered. Vitals stable.    Post blood return: Brisk  Post-Flush: NS  PIV removed post-infusion. No signs of infiltration/phlebitis.    Patient Education  Learner: Patient  Barriers to learning: No Barriers  Readiness to learn: Acceptance  Method: Explanation    Treatment Education: Information/teaching given to patient including: signs and symptoms of infection, bleeding, adverse reaction(s), symptom control, and when to notify MD.    [name]: Instructed patient to call for assistance.    Pain Education: Patient instructed to contact nurse if pain should develop or if their current pain therapy becomes ineffective.    Response: Verbalizes understanding    Discharge Plan  Discharge  instructions given to patient.  Future appointments given and reviewed with treatment plan.  Discharge Mode: Ambulatory  Discharge Time: 1900  Accompanied by: Self  Discharged To: Home

## 2019-08-12 LAB — IGG SUBCLASS PANEL, BLOOD
IGG Subclass 1: 506 mg/dL (ref 240–1118)
IGG Subclass 2: 281 mg/dL (ref 124–549)
IGG Subclass 3: 13 mg/dL — ABNORMAL LOW (ref 21–134)
IGG Subclass 4: 135 mg/dL — ABNORMAL HIGH (ref 1–123)

## 2019-08-13 ENCOUNTER — Other Ambulatory Visit (INDEPENDENT_AMBULATORY_CARE_PROVIDER_SITE_OTHER): Payer: Medicare Other

## 2019-08-13 LAB — IMMUNOFIXATION, BLOOD

## 2019-08-13 LAB — IMMUNOFIX INTERPRETATION, SERUM

## 2019-08-16 ENCOUNTER — Encounter: Payer: Self-pay | Admitting: Legal Medicine

## 2019-08-16 ENCOUNTER — Ambulatory Visit (INDEPENDENT_AMBULATORY_CARE_PROVIDER_SITE_OTHER): Payer: Medicare HMO | Admitting: Legal Medicine

## 2019-08-16 ENCOUNTER — Other Ambulatory Visit: Payer: Self-pay

## 2019-08-16 DIAGNOSIS — E782 Mixed hyperlipidemia: Secondary | ICD-10-CM | POA: Diagnosis not present

## 2019-08-16 DIAGNOSIS — J449 Chronic obstructive pulmonary disease, unspecified: Secondary | ICD-10-CM | POA: Diagnosis not present

## 2019-08-16 DIAGNOSIS — F321 Major depressive disorder, single episode, moderate: Secondary | ICD-10-CM | POA: Diagnosis not present

## 2019-08-16 DIAGNOSIS — Z681 Body mass index (BMI) 19 or less, adult: Secondary | ICD-10-CM | POA: Diagnosis not present

## 2019-08-16 DIAGNOSIS — E440 Moderate protein-calorie malnutrition: Secondary | ICD-10-CM | POA: Diagnosis not present

## 2019-08-16 DIAGNOSIS — I10 Essential (primary) hypertension: Secondary | ICD-10-CM | POA: Diagnosis not present

## 2019-08-16 LAB — PULMONARY FUNCTION TEST
FEV1/FVC: 67 %
FEV1: 0.22 L
FVC: 0.26 L

## 2019-08-16 MED ORDER — TIOTROPIUM BROMIDE-OLODATEROL 2.5-2.5 MCG/ACT IN AERS: 1.0000 "application " | INHALATION_SPRAY | Freq: Two times a day (BID) | RESPIRATORY_TRACT | 6 refills | Status: DC

## 2019-08-16 NOTE — Assessment & Plan Note (Signed)

## 2019-08-16 NOTE — Assessment & Plan Note (Signed)
An individualize plan was formulated for care of COPD.  Treatment is evidence based.  She will continue on inhalers, avoid smoking and smoke.  Regular exercise with help with dyspnea. Routine follow ups and medication compliance is needed. Start back on stiolto

## 2019-08-16 NOTE — Assessment & Plan Note (Signed)

## 2019-08-16 NOTE — Progress Notes (Signed)
Established Patient Office Visit  Subjective:  Patient ID: Dorothy Parker, female    DOB: May 16, 1960  Age: 59 y.o. MRN: [id number]  CC:  Chief Complaint  Patient presents with  . Hyperlipidemia  . Hypertension  . COPD    HPI Dorothy LECOMPTE presents for chronic visit.  Patient presents with hyperlipidemia.  Compliance with treatment has been good; patient takes medicines as directed, maintains low cholesterol diet, follows up as directed, and maintains exercise regimen.  Patient is using crestor without problems.  Patient presents for follow up of hypertension.  Patient tolerating metoprolol well with side effects.  Patient was diagnosed with hypertension 2010 so has been treated for hypertension for 10 years.Patient is working on maintaining diet and exercise regimen and follows up as directed. Complication include none.  Patient presents with diagnosis of COPD.  It is not secondary to prolonged asthma.  Diagnosis 2010  Treatment includes stiolto.  The diagnosis has not been hospitalized for this diagnosis. Last na.  Patient is compliant with regular use of medicines.  Past Medical History:  Diagnosis Date  . COPD (chronic obstructive pulmonary disease) ([place])   . Essential hypertension   . Major depressive disorder, single episode, moderate ([place])   . Mixed hyperlipidemia     History reviewed. No pertinent surgical history.  History reviewed. No pertinent family history.  Social History   Socioeconomic History  . Marital status: Divorced    Spouse name: Not on file  . Number of children: Not on file  . Years of education: Not on file  . Highest education level: Not on file  Occupational History  . Not on file  Tobacco Use  . Smoking status: Current Every Day Smoker    Packs/day: 1.50    Years: 45.00    Pack years: 67.50    Types: Cigarettes  . Smokeless tobacco: Never Used  Substance and Sexual Activity  . Alcohol use: Not Currently  . Drug use: Yes    Types:  Marijuana  . Sexual activity: Not on file  Other Topics Concern  . Not on file  Social History Narrative  . Not on file   Social Determinants of Health   Financial Resource Strain:   . Difficulty of Paying Living Expenses:   Food Insecurity:   . Worried About Charity fundraiser in the Last Year:   . Arboriculturist in the Last Year:   Transportation Needs:   . Film/video editor (Medical):   Marland Kitchen Lack of Transportation (Non-Medical):   Physical Activity:   . Days of Exercise per Week:   . Minutes of Exercise per Session:   Stress:   . Feeling of Stress :   Social Connections:   . Frequency of Communication with Friends and Family:   . Frequency of Social Gatherings with Friends and Family:   . Attends Religious Services:   . Active Member of Clubs or Organizations:   . Attends Archivist Meetings:   Marland Kitchen Marital Status:   Intimate Partner Violence:   . Fear of Current or Ex-Partner:   . Emotionally Abused:   Marland Kitchen Physically Abused:   . Sexually Abused:     Outpatient Medications Prior to Visit  Medication Sig Dispense Refill  . aspirin 81 MG EC tablet     . naloxone (NARCAN) 4 MG/0.1ML LIQD nasal spray kit USE 1 SPRAY IN ONE NOSTRIL. MAY REPEAT IN OTHER NOSTRIL AFTER 2-3 MINS IF NEEDED    .  nitroGLYCERIN (NITROLINGUAL) 0.4 MG/SPRAY spray Place under the tongue.    . Tiotropium Bromide-Olodaterol 2.5-2.5 MCG/ACT AERS Inhale 1 application into the lungs in the morning and at bedtime.    Marland Kitchen ascorbic acid (VITAMIN C) 500 MG tablet Take by mouth.    Marland Kitchen glycopyrrolate (ROBINUL) 1 MG tablet     . lidocaine (LIDODERM) 5 %     . metoprolol tartrate (LOPRESSOR) 25 MG tablet     . oxyCODONE-acetaminophen (PERCOCET) 7.5-325 MG tablet     . quinapril (ACCUPRIL) 10 MG tablet     . rosuvastatin (CRESTOR) 5 MG tablet     . triamcinolone cream (KENALOG) 0.1 % APPLY A THIN LAYER TO THE AFFECTED AREA(S) 2 TIMES PER DAY 80 g 3  . amLODipine (NORVASC) 10 MG tablet      No  facility-administered medications prior to visit.    Allergies  Allergen Reactions  . Bupropion Other (See Comments)  . Clarithromycin Other (See Comments)  . Clopidogrel Other (See Comments)  . Varenicline Other (See Comments)  . Vitamin D Analogs Rash    High dosage of Vitamin D will cause rash High dosage of Vitamin D will cause rash High dosage of Vitamin D will cause rash   . Prednisone Other (See Comments)    Hyperglycemia    ROS Review of Systems  Constitutional: Negative.   HENT: Negative.   Eyes: Negative.   Respiratory: Negative.   Cardiovascular: Negative.   Gastrointestinal: Negative.   Endocrine: Negative.   Genitourinary: Negative.   Musculoskeletal: Negative.   Skin: Negative.   Neurological: Negative.   Psychiatric/Behavioral: Negative.       Objective:    Physical Exam  Constitutional: She is oriented to person, place, and time. She appears well-developed and well-nourished.  HENT:  Head: Normocephalic and atraumatic.  Right Ear: External ear normal.  Left Ear: External ear normal.  Mouth/Throat: Oropharynx is clear and moist.  Eyes: Pupils are equal, round, and reactive to light. Conjunctivae and EOM are normal.  Cardiovascular: Normal rate, regular rhythm, normal heart sounds and intact distal pulses.  Pulmonary/Chest: Effort normal and breath sounds normal.  Abdominal: Soft. Bowel sounds are normal.  Musculoskeletal:        General: Normal range of motion.     Cervical back: Normal range of motion and neck supple.  Neurological: She is alert and oriented to person, place, and time. She has normal reflexes.  Skin: Skin is warm and dry.  Psychiatric: She has a normal mood and affect. Her behavior is normal.  Vitals reviewed.  Pulmonary Functions Testing Results:  FEV1  Date Value Ref Range Status  08/16/2019 0.22 liters Final   FVC  Date Value Ref Range Status  08/16/2019 0.26 liters Final   FEV1/FVC  Date Value Ref Range Status    08/16/2019 67.0 % Final   BP 140/82   Pulse (!) 53   Temp (!) 97.3 F (36.3 C)   Resp 18   Ht 5' 4.5" (1.638 m)   Wt 101 lb 6.4 oz (46 kg)   SpO2 99%   BMI 17.14 kg/m  Wt Readings from Last 3 Encounters:  08/16/19 101 lb 6.4 oz (46 kg)     Health Maintenance Due  Topic Date Due  . Hepatitis C Screening  Never done  . HIV Screening  Never done  . COVID-19 Vaccine (1) Never done  . TETANUS/TDAP  Never done  . PAP SMEAR-Modifier  Never done  . COLONOSCOPY  Never done  There are no preventive care reminders to display for this patient.  No results found for: TSH No results found for: WBC, HGB, HCT, MCV, PLT No results found for: NA, K, CHLORIDE, CO2, GLUCOSE, BUN, CREATININE, BILITOT, ALKPHOS, AST, ALT, PROT, ALBUMIN, CALCIUM, ANIONGAP, EGFR, GFR No results found for: CHOL No results found for: HDL No results found for: LDLCALC No results found for: TRIG No results found for: CHOLHDL No results found for: HGBA1C    Assessment & Plan:   Problem List Items Addressed This Visit      Cardiovascular and Mediastinum   Essential hypertension    An individual hypertension care plan was established and reinforced today.  The patient's status was assessed using clinical findings on exam and labs or diagnostic tests. The patient's success at meeting treatment goals on disease specific evidence-based guidelines and found to be well controlled. SELF MANAGEMENT: The patient and I together assessed ways to personally work towards obtaining the recommended goals. RECOMMENDATIONS: avoid decongestants found in common cold remedies, decrease consumption of alcohol, perform routine monitoring of BP with home BP cuff, exercise, reduction of dietary salt, take medicines as prescribed, try not to miss doses and quit smoking.  Regular exercise and maintaining a healthy weight is needed.  Stress reduction may help. A CLINICAL SUMMARY including written plan identify barriers to care unique to  individual due to social or financial issues.  We attempt to mutually creat solutions for individual and family understanding.      Relevant Medications   aspirin 81 MG EC tablet   metoprolol tartrate (LOPRESSOR) 25 MG tablet   nitroGLYCERIN (NITROLINGUAL) 0.4 MG/SPRAY spray   quinapril (ACCUPRIL) 10 MG tablet   rosuvastatin (CRESTOR) 5 MG tablet   Other Relevant Orders   CBC with Differential/Platelet   Comprehensive metabolic panel     Respiratory   COPD (chronic obstructive pulmonary disease) ([facility])    An individualize plan was formulated for care of COPD.  Treatment is evidence based.  She will continue on inhalers, avoid smoking and smoke.  Regular exercise with help with dyspnea. Routine follow ups and medication compliance is needed. Start back on stiolto      Relevant Medications   Tiotropium Bromide-Olodaterol 2.5-2.5 MCG/ACT AERS   Other Relevant Orders   Pulmonary function test (Completed)     Other   Mixed hyperlipidemia    AN INDIVIDUAL CARE PLAN for hyperlipidemia/ cholesterol was established and reinforced today.  The patient's status was assessed using clinical findings on exam, lab and other diagnostic tests. The patient's disease status was assessed based on evidence-based guidelines and found to be well controlled. MEDICATIONS were reviewed. SELF MANAGEMENT GOALS have been discussed and patient's success at attaining the goal of low cholesterol was assessed. RECOMMENDATION given include regular exercise 3 days a week and low cholesterol/low fat diet. CLINICAL SUMMARY including written plan to identify barriers unique to the patient due to social or economic  reasons was discussed.      Relevant Medications   aspirin 81 MG EC tablet   metoprolol tartrate (LOPRESSOR) 25 MG tablet   nitroGLYCERIN (NITROLINGUAL) 0.4 MG/SPRAY spray   quinapril (ACCUPRIL) 10 MG tablet   rosuvastatin (CRESTOR) 5 MG tablet   Other Relevant Orders   Lipid panel   Major depressive  disorder, single episode, moderate ([place])    Patient's depression is controlled with none now..   Anhedonia better.  PHQ 9 was was not performed score . An individual care plan was established or reinforced today.  The patient's disease status was assessed using clinical findings on exam, labs, and or other diagnostic testing to determine patient's success in meeting treatment goals based on disease specific evidence-based guidelines and found to be improving Recommendations include stay off medicines      Adult BMI <19 kg/sq m    Supplement nutrition with protein/calorie supplement with meals to improve nutritional status.      Malnutrition of moderate degree ([facility])    Supplement nutrition with protein/calorie supplement with meals to improve nutritional status.         Meds ordered this encounter  Medications  . Tiotropium Bromide-Olodaterol 2.5-2.5 MCG/ACT AERS    Sig: Inhale 1 application into the lungs in the morning and at bedtime.    Dispense:  4 g    Refill:  6    Follow-up: Return in about 6 months (around 02/15/2020) for fasting.    Reinaldo Meeker, MD

## 2019-08-16 NOTE — Assessment & Plan Note (Signed)
Supplement nutrition with protein/calorie supplement with meals to improve nutritional status. 

## 2019-08-16 NOTE — Assessment & Plan Note (Signed)
Patient's depression is controlled with none now..   Anhedonia better.  PHQ 9 was was not performed score . An individual care plan was established or reinforced today.  The patient's disease status was assessed using clinical findings on exam, labs, and or other diagnostic testing to determine patient's success in meeting treatment goals based on disease specific evidence-based guidelines and found to be improving Recommendations include stay off medicines

## 2019-08-17 DIAGNOSIS — I10 Essential (primary) hypertension: Secondary | ICD-10-CM | POA: Diagnosis not present

## 2019-08-17 DIAGNOSIS — I2510 Atherosclerotic heart disease of native coronary artery without angina pectoris: Secondary | ICD-10-CM | POA: Diagnosis not present

## 2019-08-17 DIAGNOSIS — I6523 Occlusion and stenosis of bilateral carotid arteries: Secondary | ICD-10-CM | POA: Diagnosis not present

## 2019-08-17 DIAGNOSIS — E785 Hyperlipidemia, unspecified: Secondary | ICD-10-CM | POA: Diagnosis not present

## 2019-08-17 DIAGNOSIS — Z951 Presence of aortocoronary bypass graft: Secondary | ICD-10-CM | POA: Diagnosis not present

## 2019-08-17 LAB — COMPREHENSIVE METABOLIC PANEL
ALT: 6 IU/L (ref 0–32)
AST: 12 IU/L (ref 0–40)
Albumin/Globulin Ratio: 2 (ref 1.2–2.2)
Albumin: 4.4 g/dL (ref 3.8–4.9)
Alkaline Phosphatase: 105 IU/L (ref 39–117)
BUN/Creatinine Ratio: 20 (ref 12–28)
BUN: 12 mg/dL (ref 8–27)
Bilirubin Total: 0.5 mg/dL (ref 0.0–1.2)
CO2: 21 mmol/L (ref 20–29)
Calcium: 9.2 mg/dL (ref 8.7–10.3)
Chloride: 107 mmol/L — ABNORMAL HIGH (ref 96–106)
Creatinine, Ser: 0.6 mg/dL (ref 0.57–1.00)
GFR calc Af Amer: 115 mL/min/{1.73_m2} (ref >59)
GFR calc non Af Amer: 99 mL/min/{1.73_m2} (ref >59)
Globulin, Total: 2.2 g/dL (ref 1.5–4.5)
Glucose: 82 mg/dL (ref 65–99)
Potassium: 4.2 mmol/L (ref 3.5–5.2)
Sodium: 141 mmol/L (ref 134–144)
Total Protein: 6.6 g/dL (ref 6.0–8.5)

## 2019-08-17 LAB — CBC WITH DIFFERENTIAL/PLATELET
Basophils Absolute: 0 10*3/uL (ref 0.0–0.2)
Basos: 1 %
EOS (ABSOLUTE): 0.1 10*3/uL (ref 0.0–0.4)
Eos: 2 %
Hematocrit: 40.3 % (ref 34.0–46.6)
Hemoglobin: 13.9 g/dL (ref 11.1–15.9)
Immature Grans (Abs): 0 10*3/uL (ref 0.0–0.1)
Immature Granulocytes: 0 %
Lymphocytes Absolute: 2.1 10*3/uL (ref 0.7–3.1)
Lymphs: 30 %
MCH: 32.7 pg (ref 26.6–33.0)
MCHC: 34.5 g/dL (ref 31.5–35.7)
MCV: 95 fL (ref 79–97)
Monocytes Absolute: 0.5 10*3/uL (ref 0.1–0.9)
Monocytes: 7 %
Neutrophils Absolute: 4.2 10*3/uL (ref 1.4–7.0)
Neutrophils: 60 %
Platelets: 211 10*3/uL (ref 150–450)
RBC: 4.25 x10E6/uL (ref 3.77–5.28)
RDW: 12.5 % (ref 11.7–15.4)
WBC: 7 10*3/uL (ref 3.4–10.8)

## 2019-08-17 LAB — LIPID PANEL
Chol/HDL Ratio: 2.4 ratio (ref 0.0–4.4)
Cholesterol, Total: 124 mg/dL (ref 100–199)
HDL: 52 mg/dL (ref >39)
LDL Chol Calc (NIH): 52 mg/dL (ref 0–99)
Triglycerides: 110 mg/dL (ref 0–149)
VLDL Cholesterol Cal: 20 mg/dL (ref 5–40)

## 2019-08-17 LAB — CARDIOVASCULAR RISK ASSESSMENT

## 2019-08-17 NOTE — Progress Notes (Signed)
CBC normal, Kidney and liver tests normal, Cholesterol normal lp

## 2019-08-20 ENCOUNTER — Ambulatory Visit (INDEPENDENT_AMBULATORY_CARE_PROVIDER_SITE_OTHER): Payer: Medicare Other | Admitting: Ophthalmology

## 2019-08-21 ENCOUNTER — Telehealth (INDEPENDENT_AMBULATORY_CARE_PROVIDER_SITE_OTHER): Payer: Self-pay | Admitting: Gastroenterology

## 2019-08-21 NOTE — Telephone Encounter (Signed)
Contacted the patient and LVM. If the patient calls back, please inform her that her procedure on 09/07/2019 has a new check-in time of 8:00am and document. Patient's prep instructions have been updated as well. Thank you.

## 2019-08-28 ENCOUNTER — Other Ambulatory Visit: Payer: Self-pay | Admitting: Legal Medicine

## 2019-08-28 NOTE — Anesthesia Preprocedure Evaluation (Addendum)
ANESTHESIA PRE-OPERATIVE EVALUATION    Patient Information    Name: Debra Bolton    MRN: [id number]    DOB: 1960/08/20    Age: 59 year old    Sex: female  Procedure(s):  PHACOEMULSIFICATION, CATARACT, WITH IOL IMPLANT OD      Pre-op Vitals:   There were no vitals taken for this visit.        Primary language spoken:  English    ROS/Medical History:      History of Present Illness: Diagnosis: Nuclear sclerotic cataract of right eye    Day of Surgery: 09/26/20    Anesthsia: MAC      General:  negative for Obesity,  has not fallen in the last 6 months,  This is a phone interview    Patient lives with partner    Activity level: walks 5 miles/day, yoga 3 x week    Independent with all ADLs. Has balance issues and is currently getting PHYSICAL THERAPY      Stated Height: 5'4"    Stated Weight:144 lbs     BMI: 24.75 based on stated height and weight    Cardiovascular:  no CAD/Angina/MI/CABG/Stents, Anti-platelet drugs: No,   no hypertension,  no dysrhythmias,  no pacemaker,  No activity related chest pain or SOB. Denies palpitations.    Followed by [facility] PCP, Dr. Rana Snare  Last seen 07/23/20    10/05/19 2 ECHO  LV EF: 77%    06/14/19 EKG NSR 87   Anesthesia History:  history of anesthetic complications (Last colonoscopy at [place], 2013, she was very aggitated during the sx, but she did not remember anything ),  PONV (Nausea with anesthesia),  no history of difficult IV access,  chronic pain patient (Nerve pain: legs and back o rall over. Hx of MS),  no family history of anesthetic complications,   Pulmonary:   asthma,  no sleep apnea,  no recent URI/pneumonia,  Episodes of SOB with exertional exercise, Albuterol PRN, last used 1 month ago. Patient is speaking in full sentences.     Followed by [facility] with Pulmonologist, [place], [place]     Last seen: 04/07/20    Recent pulmonary function test negative   Just had a cardio Calcium Study - waiting for the result         Neuro/Psych:   negative for TIA/CVA,  no  seizures,  neuromuscular disease,  psychiatric history,  Hx of MS  Stable  Retuximab,  Baclofen, Amantadine, Lioresal, Valium, Cymbalta, Wellbutrin, Nortriptyline, Zaniflex - also used for anxiety and PTSD, which are controlled.      Followed by [facility]  Arelia Sneddon, MD  Last seen: 06/01/19     Hx: Depression and PTSD - stable   Followed by [facility]  Areta Haber, NP  Last seen 04/2020 Hematology/Oncology:   hematologic/lymphatic negative  no history of cancer,      GI/Hepatic:  no GERD,  no pancreatic disease,  no bowel obstruction,   Infectious Disease:  no hepatitis,  no tuberculosis,  Previous COVID-19 dx: 11/2019, residual symptoms, SOB with exertional exercise, Albuterol prn. Last used 1 month ago     COVID Vaccine:   [place] x 3 in Epic        Renal:  negative renal ROS  09/23/2020   Creatinine: 0.82  GFR: >60   Endocrine/Other:  history of thyroid disease (Hypo),  arthritis (Bil hands ),   back pain (LBP with radiating into the right leg. ABle  to lie flat with pillows under her kness),  07/24/2020  Free T4: 0.85 (L)  TSH: 0.97      09/23/2020   Creatinine: 0.82  GFR: >60    04/02/2020   Glycated Hemoglobin: 5.4     Pregnancy History:   Pediatrics:         Pre Anesthesia Testing (PCC/CPC) notes/comments:                          Patient to be driven home/assisted by partner, Legrand Como   Pre-op instructions given via my chart                Physical Exam    Airway:    Inter-inciser distance 3-4 cm  Prognanth Able    Mallampati: II  TM distance: 5-6 cm            Cardiovascular:  - cardiovascular exam normal         Pulmonary:  - pulmonary exam normal           Neuro/Neck/Skeletal/Skin:      Dental:      Abdominal:          Additional Clinical Notes:   Other Physical Exam Findings: No physical exam. This is a phone interview.              Last  OSA (STOP BANG) Score:  No data recorded    Last OSA  (STOP) Score for   No data recorded                 Past Medical History:   Diagnosis Date     Asthma 1991    with pregnancy    Hypothyroidism     Major depressive disorder, single episode     MS (multiple sclerosis) (CMS-HCC)      Past Surgical History:   Procedure Laterality Date    leep      for cervical dysplasia    LYMPH NODE BIOPSY      IgG4 related disease    TONSILLECTOMY AND ADENOIDECTOMY      only tonsillectomy     Social History     Socioeconomic History    Marital status: Divorced   Tobacco Use    Smoking status: Former Smoker     Packs/day: 1.00     Years: 15.00     Pack years: 15.00     Types: Cigarettes     Quit date: 01/27/2016     Years since quitting: 4.7    Smokeless tobacco: Never Used   Substance and Sexual Activity    Alcohol use: Yes     Alcohol/week: 2.0 standard drinks     Types: 2 Glasses of wine per week     Comment: 2 glasses of wine per week    Drug use: Never    Sexual activity: Yes     Partners: Male     Birth control/protection: Rhythm   Other Topics Concern    Military Service No    Blood Transfusions No    Caffeine Concern No    Occupational Exposure No    Hobby Hazards No    Sleep Concern No    Stress Concern No    Weight Concern No    Special Diet No    Back Care No    Exercises Regularly Yes    Bike Helmet Use Yes    Seat Belt Use Yes    Performs  Self-Exams Yes   Social History Narrative    Moved from [place] is great, high fiber, she is very conscientious of effect of diet on health.    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    Hasn't had a cigarette in 8 months.    Has smoked 26 years, less than a pack a day.    Currently drinks 2-4 drinks a day on the weekends, total 4-8 per week.    Sexually active with one female partner.    Currently not working, has been on disability for most of the time from 1992 to now.     Alcohol Use: Not At Risk    Frequency of Alcohol Consumption: 2-4 times a month    Average Number of Drinks: 1 or 2    Frequency of Binge Drinking: Never       No current facility-administered medications for this visit.     No current  outpatient medications on file.     Facility-Administered Medications Ordered in Other Visits   Medication Dose Route Frequency Provider Last Rate Last Admin    lidocaine (XYLOCAINE) 1% PF injection 0.1 mL  0.1 mL IntraDERMAL Once PRN Pamala Hurry, MD        sodium chloride 0.9% infusion   IntraVENOUS Continuous Pamala Hurry, MD         Allergies   Allergen Reactions    Penicillins Rash    Modafinil Diarrhea and Nausea and Vomiting       Labs and Other Data  Lab Results   Component Value Date    NA 142 09/23/2020    K 3.8 09/23/2020    CL 104 09/23/2020    BICARB 26 09/23/2020    BUN 9 09/23/2020    CREAT 0.82 09/23/2020    GLU 93 09/23/2020    [place] 9.9 09/23/2020     Lab Results   Component Value Date    AST 21 09/23/2020    ALT 18 09/23/2020    ALK 56 09/23/2020    TP 7.2 09/23/2020    ALB 4.4 09/23/2020    TBILI 0.35 09/23/2020     Lab Results   Component Value Date    WBC 6.2 09/23/2020    RBC 4.13 09/23/2020    HGB 12.2 09/23/2020    HCT 38.1 09/23/2020    MCV 92.3 09/23/2020    MCHC 32.0 09/23/2020    RDW 11.9 (L) 09/23/2020    PLT 271 09/23/2020    MPV 9.5 09/23/2020    SEG 54 09/23/2020    LYMPHS 30 09/23/2020    MONOS 11 09/23/2020    EOS 5 09/23/2020    BASOS 1 09/23/2020     Lab Results   Component Value Date    INR 1.0 06/18/2019    PTT 33 06/18/2019     No results found for: ARTPH, ARTPO2, ARTPCO2    Anesthesia Plan:  Risks and Benefits of Anesthesia  I have personally performed an appropriate pre-anesthesia physical exam of the patient (including heart, lungs, and airway) prior to the anesthetic and reviewed the pertinent medical history, drug and allergy history, laboratory and imaging studies and consultations.   I have determined that the patient has had adequate assessment and testing.  I have validated the documentation of these elements of the patient exam and/or have made necessary changes to reflect my own observations during my pre-anesthesia exam.  Anesthetic techniques,  invasive monitors, anesthetic drugs for induction, maintenance  and post-operative analgesia, risks and alternatives have been explained to the patient and/or patient's representatives.    I have prescribed the anesthetic plan:         Planned anesthesia method: Monitored Anesthesia Care         ASA 3 (Severe systemic disease)     Potential anesthesia problems identified and risks including but not limited to the following were discussed with patient and/or patient's representative: Adverse or allergic drug reaction        Planned monitoring method: Routine monitoring  Comments: (NP note dated 09/24/20  Pre-op instructions given via my chart    NOTE: [place] x 3 in Epic    ANESTHESIA NOTE: Last colonoscopy at [facility], 2013, she was very agitated during the procedure, but she did not remember anything   )    Informed Consent:  Anesthetic plan and risks discussed with Patient.          ANESTHESIA PRE-OPERATIVE EVALUATION    Patient Information    Name: Debra Bolton    MRN: [id number]    DOB: Jan 20, 1961    Age: 59 year old    Sex: female  Procedure(s):  GI COLONOSCOPY      Pre-op Vitals:   There were no vitals taken for this visit.        Primary language spoken:  English        ROS/Medical History:      History of Present Illness: 59 year old female with rectal bleeding presenting for pre-operative assessment in anticipation of above procedure at [facility] on 10/15/20 with Dr. Truddie Coco.      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 She also run/walk on treadmill almost 5/week each for 45 min without issues. Cardiovascular:  negative cardio ROS  no CAD/Angina/MI/CABG/Stents,   no hypertension,  no dysrhythmias,  no pacemaker,        Has stress echo on 5/26 /21   ---  Summary:   1. Negative stress echo for ischemia.   2. Good exercise capacity for the patients age.   3. No significant LVOT gradient with exercise (peak velocity 2.2 m/s).   4. Normal blood pressure response to exercise.    Bell MD  Electronically signed on 10/05/2019 at 6:46:34 PM       Ct heart calcium score  [place] CALCIUM SCORE:  Left main coronary artery: 0  Left anterior descending coronary artery: 54.2  Left circumflex coronary artery: 0  Right coronary artery: 0  TOTAL = 54.2.    Additional findings: Bochdalek hernia of the right diaphragm with protrusion of abdominal fat, measuring 3.6 cm. Additionally, liver with irregular contour, suggestive of cirrhosis. Descending aorta with focal calcification.             Signed by: Margarita Mail 09/24/2020 10:32:23  IMPRESSION:  IMPRESSION:  [place] Calcium score =54.2. The places the patient at the 30th percentile for subjects of the same age, gender, and race/ethnicity who are free of clinical cardiovascular disease and treated diabetes.    Bochdalek hernia incidentally noted. Liver with irregular contour, suggestive of cirrhosis. Recommend correlation with recent dedicated hepatic imaging or follow-up with liver  ultrasound, if no recent imaging is available.          Specimen Collected: 09/24/20 10:35 Last Resulted: 09/24/20 10:32      No cp no stents           "Stress echo ordered per cardiology -Exercise stress echo for risk stratification and to r/o CAD."      Echo showed dynamic LVOT obstruction and mild LVH. 1 week Event monitor was essentially normal. Carotid duplex also normal  STUDIES:  ECG 06/15/2019- (personally interpreted): NSR     Echo 2021  1. The left ventricular size is normal. The left ventricular systolic function is hyperdynamic.  2. Mild concentric left ventricular hypertrophy.  3. Mild or grade I (impaired relaxation pattern) left ventricular diastolic filling.  4. A dynamic LV outflow  gradient is present 1.65 M/sec (11 mmHg) increasing to 2.4 M/sec (23 mmHg) with valsalva.  5. The right ventricular size is normal and systolic function is normal.  6. Mild pulmonary hypertension with right ventricular systolic pressure measuring 38 mmHg.  7. No previous study for comparison.    Event monitor 3/13/-07/06/2019- uploaded to media  No arrhythmia noted, PAC<1 %    Carotid duplex 06/2019  No evidence of hemodynamically significant stenosis.       Anesthesia History:  negative anesthesia history ROS  Told she should be sedated deeper than the last colonoscopy due to agitation-   Pulmonary:   negative pulmonary ROS     Neuro/Psych:   negative for TIA/CVA,  seizures (when younger before dx MS age 37?),  neuromuscular disease,  psychiatric history,  MS  Is on Rituxan infusions every 6 months most recent several weeks ago   relapsing MS now in remission,-- progressive  Slow decline  Still ambulatory, active , no difficulty swallowing , no extremity weakness   No recent hospitalizations  Hematology/Oncology:   hematologic/lymphatic negative  "IgG4 disease --"non cancerous -followed by rheumatology - also treated with Rituxan          GI/Hepatic:  no GERD,  no esophageal varices,  no liver disease,  bowel prep,   Infectious Disease:  negative for infectious disease     Renal:  negative renal ROS   Endocrine/Other:  no diabetes,  history of thyroid disease (on replacement ),  no arthritis,   no back pain,     Pregnancy History:   Pediatrics:         Pre Anesthesia Testing (PCC/CPC) notes/comments:    [facility] Test & records reviewed by [facility] Provider.                                    Physical Exam    Airway:    Inter-inciser distance 3-4 cm  Prognanth Able    Mallampati: II  TM distance: 5-6 cm            Cardiovascular:  - cardiovascular exam normal         Pulmonary:  - pulmonary exam normal           Neuro/Neck/Skeletal/Skin:  - Neck/Neuro/Skeletal/Skin exam normal          Dental:  - normal exam  (+) upper  dentures      Abdominal:      General: normal weight     Additional Clinical Notes:   Other Physical Exam Findings: No physical exam. This is a phone interview.  Last  OSA (STOP BANG) Score:  No data recorded    Last OSA  (STOP) Score for   No data recorded                 Past Medical History:   Diagnosis Date    Asthma 1991    with pregnancy    Hypothyroidism     Major depressive disorder, single episode     MS (multiple sclerosis) (CMS-HCC)      Past Surgical History:   Procedure Laterality Date    leep      for cervical dysplasia    LYMPH NODE BIOPSY      IgG4 related disease    TONSILLECTOMY AND ADENOIDECTOMY      only tonsillectomy     Social History     Tobacco Use    Smoking status: Former Smoker     Packs/day: 1.00     Years: 15.00     Pack years: 15.00     Types: Cigarettes     Quit date: 01/27/2016     Years since quitting: 3.5    Smokeless tobacco: Never Used   Substance Use Topics    Alcohol use: Yes     Alcohol/week: 2.0 standard drinks     Types: 2 Glasses of wine per week     Frequency: 2-4 times a month     Drinks per session: 1 or 2     Binge frequency: Never    Drug use: Never       Current Outpatient Medications   Medication Sig Dispense Refill    albuterol (VENTOLIN HFA) 108 (90 Base) MCG/ACT inhaler Inhale 2 puffs by mouth every 6 hours as needed for Wheezing. 3 Inhaler 3    Amantadine HCl 100 MG tablet Take 100 mg by mouth 2 times daily. Takes PRN 60 tablet 0    baclofen (LIORESAL) 10 MG tablet Take 1 tablet (10 mg) by mouth 3 times daily. (Patient taking differently: Take 10 mg by mouth nightly. ) 90 tablet 11    diazepam (VALIUM) 5 MG tablet Take 1 tablet (5 mg) by mouth daily as needed for Anxiety. 7 tablet 0    DULoxetine (CYMBALTA) 60 MG CR capsule Take 2 capsules (120 mg) by mouth daily. 180 capsule 3    gabapentin (NEURONTIN) 300 MG capsule Take 2 capsules (600 mg) by mouth nightly. 60 capsule 5    meclizine (ANTIVERT) 25 MG tablet Take 1 tablet (25 mg) by  mouth every 8 hours as needed for Dizziness. 30 tablet 0    metoprolol succinate (TOPROL XL) 25 MG XL tablet Take 1 tablet (25 mg) by mouth daily. 30 tablet 0    nortriptyline (PAMELOR) 25 MG capsule Take 2 capsules (50 mg) by mouth nightly. 180 capsule 3    prednisoLONE acetate (PRED FORTE) 1 % ophthalmic suspension Place 1 drop into right eye 4 times daily. For two weeks then tid for one week then bid for one week then daily for one week then stop. 1 bottle 2    thyroid (ARMOUR THYROID) 15 MG tablet Take 1 tablet (15 mg) by mouth every other day. 45 tablet 2    thyroid (ARMOUR THYROID) 60 MG tablet Take 1 tablet (60 mg) by mouth daily. 90 tablet 2    tiZANidine (ZANAFLEX) 4 MG tablet Take 1.5 tablets (6 mg) by mouth at bedtime. 135 tablet 3     No current facility-administered medications for this visit.  Allergies   Allergen Reactions    Penicillins Rash    Modafinil Diarrhea and Nausea and Vomiting       Labs and Other Data  Lab Results   Component Value Date    NA 139 08/10/2019    K 4.2 08/10/2019    CL 102 08/10/2019    BICARB 27 08/10/2019    BUN 20 08/10/2019    CREAT 0.93 08/10/2019    GLU 80 08/10/2019    [place] 10.0 08/10/2019     Lab Results   Component Value Date    AST 27 08/10/2019    ALT 21 08/10/2019    ALK 60 08/10/2019    TP 7.7 08/10/2019    ALB 4.7 08/10/2019    TBILI 0.33 08/10/2019     Lab Results   Component Value Date    WBC 6.7 08/10/2019    RBC 4.50 08/10/2019    HGB 13.6 08/10/2019    HCT 40.1 08/10/2019    MCV 89.1 08/10/2019    MCHC 33.9 08/10/2019    RDW 11.8 (L) 08/10/2019    PLT 305 08/10/2019    MPV 9.0 (L) 08/10/2019    SEG 55 08/10/2019    LYMPHS 31 08/10/2019    MONOS 10 08/10/2019    EOS 4 08/10/2019    BASOS 1 08/10/2019     Lab Results   Component Value Date    INR 1.0 06/18/2019    PTT 33 06/18/2019     No results found for: ARTPH, ARTPO2, ARTPCO2    Anesthesia Plan:  Risks and Benefits of Anesthesia  I have personally performed an appropriate pre-anesthesia physical  exam of the patient (including heart, lungs, and airway) prior to the anesthetic and reviewed the pertinent medical history, drug and allergy history, laboratory and imaging studies and consultations.   I have determined that the patient has had adequate assessment and testing.  I have validated the documentation of these elements of the patient exam and/or have made necessary changes to reflect my own observations during my pre-anesthesia exam.  Anesthetic techniques, invasive monitors, anesthetic drugs for induction, maintenance and post-operative analgesia, risks and alternatives have been explained to the patient and/or patient's representatives.    I have prescribed the anesthetic plan:         Planned anesthesia method: Monitored Anesthesia Care         ASA 3 (Severe systemic disease)     Potential anesthesia problems identified and risks including but not limited to the following were discussed with patient and/or patient's representative: Adverse or allergic drug reaction, Recall and Injury to brain, heart and other organs        Planned monitoring method: Routine monitoring  Comments: (Plan for MAC with General Anesthesia as backup plan. NPO > 8hrs. Pt seen and evaluated in pre-op area, discussed R/B/A, answered all questions, consent obtained.  )    Informed Consent:  Anesthetic plan and risks discussed with Patient.    Plan discussed with CRNA, Surgeon, OR Nurse and Attending.

## 2019-08-29 ENCOUNTER — Ambulatory Visit (INDEPENDENT_AMBULATORY_CARE_PROVIDER_SITE_OTHER): Payer: Medicare Other | Admitting: Nurse Practitioner

## 2019-08-29 ENCOUNTER — Encounter (INDEPENDENT_AMBULATORY_CARE_PROVIDER_SITE_OTHER): Payer: Self-pay | Admitting: Nurse Practitioner

## 2019-08-29 DIAGNOSIS — Z01818 Encounter for other preprocedural examination: Secondary | ICD-10-CM

## 2019-08-29 NOTE — Patient Instructions (Signed)
PREOPERATIVE SURGICAL INFORMATION     Your surgery is currently scheduled at [facility] on 5/21  The scheduler will be contacting you with the check in time      [facility], [place], [address], [place], [place] [phone]   Please check in at Patient Services in [place] on 1st floor     [facility] (including [facility]): [employer] structure, Microbiologist structure, or Dance movement psychotherapist parking (7am-5pm at [employer]; 5am-5pm at [employer]) for same cost as self-parking in the front entrance of the [facility]   [email or web address]     QUESTIONS    If you have any questions between now and the day of your surgery, please do not hesitate to call:      [facility]: [phone]     DAY OF SURGERY ARRIVAL TIME:    On the day of your Surgery/Procedure, please arrive at the time provided by the surgery/preop team. If you have any questions regarding your arrival time, please call:     Preoperative Surgical Admissions at [facility]:             OK to take the following medications as scheduled with a small sip of water on the morning of surgery:continue all       PLEASE HOLD ALL NSAIDS (non-steroidal anti-inflammatory drugs) SUCH AS advil, aleve, motrin, ibuprofen, relafen, lodine, feldene, Diclofenac, voltaren, indomethacin, naproxen, celebrex, Mobic 7 days before surgery.        Please hold vitamins, supplements, herbs & fish oil 7 days before surgery.      It is OK to take acetaminophen (Tylenol) for pain around the time of surgery unless you have liver disease.       AFTER YOUR VISIT WITH [place], IF YOU START TAKING A NEW MEDICATION BEFORE SURGERY, PLEASE CALL us TO MAKE SURE IT IS SAFE TO TAKE & WILL NOT AFFECT YOUR SURGERY.            OSA INSTRUCTIONS:      If you use a CPAP machine, please bring the entire CPAP machine, including mask and tubing, with you on the day of surgery.    TO DO LIST:    Surgical/procedure patients are required to have COVID 19 screening test 48-96 hrs prior to surgery.   Please call the scheduling line at [phone] between 8am and 5pm M-F to schedule an appointment if you do not have an appointment already.   Drive up testing locations at [place] require appointments, and are open 7 days a week except some holidays.          EATING/DRINKING          DO NOT EAT OR DRINK ANYTHING AFTER MIDNIGHT ON THE DAY OF SURGERY    Preparing for your Surgery:      Please wear clean loose-fitting clothes and leave valuables at home    Do not shave or remove body hair. Facial shaving is permitted. If you are having head surgery, ask your doctor whether you can shave.   Bring a picture ID and your insurance card, and be prepared to pay your deductible or co-insurance by cash, check, or credit card when you arrive.    If you are going  home after your surgery, please make sure to arrange for an adult to drive you home. You CANNOT use UBER or LYFT. If you do not have a ride, your surgery may be cancelled.     Visitor policy during the KCMKL-49 pandemic is subject to change. Current visitor policy can be found at [email or web address]     On The Day of Your Surgery:       Check in at the location mentioned above    If you are a woman of child bearing age, please note that you may be asked to give a urine sample upon check-in   You will meet your anesthesia and surgery teams in the preoperative holding area before surgery.    Once surgery is over, you will wake up in the recovery room.   If you go home, an adult chaperone will need to stay with you for the first 24 hours after surgery.    Visitor policy during the ZPHXT-05 pandemic is subject to change. Current visitor policy can be found at  [email or web address]    A video about what to expect for the day of surgery can be found here:    [email or web address]  Or by searching You-tube for [place] before surgery and  after surgery     You medical records are available to you at [email or web address]

## 2019-08-30 DIAGNOSIS — Z8781 Personal history of (healed) traumatic fracture: Secondary | ICD-10-CM | POA: Diagnosis not present

## 2019-08-30 DIAGNOSIS — Z1389 Encounter for screening for other disorder: Secondary | ICD-10-CM | POA: Diagnosis not present

## 2019-08-30 DIAGNOSIS — G894 Chronic pain syndrome: Secondary | ICD-10-CM | POA: Diagnosis not present

## 2019-08-30 DIAGNOSIS — M546 Pain in thoracic spine: Secondary | ICD-10-CM | POA: Diagnosis not present

## 2019-08-30 DIAGNOSIS — M5136 Other intervertebral disc degeneration, lumbar region: Secondary | ICD-10-CM | POA: Diagnosis not present

## 2019-08-31 ENCOUNTER — Other Ambulatory Visit: Payer: Self-pay

## 2019-08-31 ENCOUNTER — Telehealth (INDEPENDENT_AMBULATORY_CARE_PROVIDER_SITE_OTHER): Payer: Self-pay | Admitting: Gastroenterology

## 2019-08-31 NOTE — Telephone Encounter (Signed)
FYI Received a message from anesthesia pre-op that NP Melrose Nakayama communicated with Dr. Truddie Coco on 08/29/2019 that this patient will need to complete a Stress Echo prior to their Colonoscopy procedure on 09/07/2019. The patient is currently scheduled for the Stress Echo on 09/13/2019. Patient will be contacted to reschedule their Colonoscopy procedure with Dr. Truddie Coco for a later date. Thank you.

## 2019-09-01 ENCOUNTER — Other Ambulatory Visit (INDEPENDENT_AMBULATORY_CARE_PROVIDER_SITE_OTHER): Payer: Self-pay | Admitting: Gastroenterology

## 2019-09-01 DIAGNOSIS — K625 Hemorrhage of anus and rectum: Secondary | ICD-10-CM

## 2019-09-03 ENCOUNTER — Ambulatory Visit (INDEPENDENT_AMBULATORY_CARE_PROVIDER_SITE_OTHER): Payer: Medicare Other | Admitting: Ophthalmology

## 2019-09-03 ENCOUNTER — Other Ambulatory Visit (INDEPENDENT_AMBULATORY_CARE_PROVIDER_SITE_OTHER): Payer: Medicare Other | Attending: Gastroenterology

## 2019-09-03 DIAGNOSIS — K625 Hemorrhage of anus and rectum: Secondary | ICD-10-CM | POA: Insufficient documentation

## 2019-09-03 DIAGNOSIS — H209 Unspecified iridocyclitis: Secondary | ICD-10-CM

## 2019-09-03 LAB — COVID-19 CORONAVIRUS DETECTION ASSAY AT ~~LOC~~ LAB: COVID-19 Coronavirus Result: NOT DETECTED

## 2019-09-03 NOTE — Telephone Encounter (Signed)
Left a VM message for the patient to call back for further assistance with rescheduling her Colonoscopy procedure on 09/07/2019 to a later date. The patient must complete a stress echo first which is currently scheduled for 09/12/2019. When the patient calls back please assist further. Thank you.

## 2019-09-03 NOTE — Assessment & Plan Note (Addendum)
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 Improved appearance today after having tapered off topical steroids since last visit, none for past few weeks with decreased symptoms. Likely improved off steroids due to recent Rituxan, last prior dose was  June 2020; no role for repeat steroid treatment now. High risk of vision loss from uncontrolled uveitis, agree with systemic treatment. Uveitis precautions emphasized. Recommendations discussed with Rheumatology.    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 High risk cataract surgery with uncertain visual potential, stable, rec observation unless visibly progresses. OCT mac w/o edema ou previously.

## 2019-09-04 ENCOUNTER — Other Ambulatory Visit (INDEPENDENT_AMBULATORY_CARE_PROVIDER_SITE_OTHER): Payer: Medicare Other

## 2019-09-04 ENCOUNTER — Other Ambulatory Visit: Payer: Self-pay

## 2019-09-04 ENCOUNTER — Other Ambulatory Visit (INDEPENDENT_AMBULATORY_CARE_PROVIDER_SITE_OTHER): Payer: Self-pay | Admitting: Interventional Cardiology

## 2019-09-04 DIAGNOSIS — I5189 Other ill-defined heart diseases: Secondary | ICD-10-CM

## 2019-09-04 NOTE — Telephone Encounter (Signed)
Patient is requesting refill of medication, please see orders for preloaded prescription refill.    Requested Prescriptions     Pending Prescriptions Disp Refills    metoprolol succinate (TOPROL XL) 25 MG XL tablet 30 tablet 0     Sig: Take 1 tablet (25 mg) by mouth daily.       LOV:   08/09/2019  NOV:   Visit date not found    Last 3 blood pressures:  Blood Pressure   08/10/19 116/75   08/10/19 121/82   08/09/19 124/82       Last basic metabolic panel:  Lab Results   Component Value Date    NA 139 08/10/2019    K 4.2 08/10/2019    CL 102 08/10/2019    BICARB 27 08/10/2019    BUN 20 08/10/2019    CREAT 0.93 08/10/2019    GLU 80 08/10/2019    Centre 10.0 08/10/2019

## 2019-09-04 NOTE — Telephone Encounter (Signed)
2nd attempt. Left a VM message for the patient to call back for further assistance with rescheduling her Colonoscopy procedure on 09/07/2019 to a later date. The patient must complete a stress echo first which is currently scheduled for 09/12/2019. When the patient calls back please assist further. Thank you.

## 2019-09-05 MED ORDER — METOPROLOL SUCCINATE 25 MG OR TB24
25.0000 mg | ORAL_TABLET | Freq: Every day | ORAL | 3 refills | Status: DC
Start: 2019-09-05 — End: 2020-04-02

## 2019-09-12 ENCOUNTER — Ambulatory Visit (HOSPITAL_COMMUNITY): Payer: Medicare Other

## 2019-09-16 NOTE — Progress Notes (Signed)
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    Posterior polar cataract OD: congenital?    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 Improved appearance today after having tapered off topical steroids since last visit, none for past few weeks with decreased symptoms. Likely improved off steroids due to recent Rituxan, last prior dose was  June 2020; no role for repeat steroid treatment now. High risk of vision loss from uncontrolled uveitis, agree with systemic treatment. Uveitis precautions emphasized. Recommendations discussed with Rheumatology.    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 High risk cataract surgery with uncertain visual potential, stable, rec observation unless visibly progresses. OCT mac w/o edema ou previously.

## 2019-09-20 ENCOUNTER — Other Ambulatory Visit: Payer: Self-pay

## 2019-09-24 ENCOUNTER — Ambulatory Visit (HOSPITAL_BASED_OUTPATIENT_CLINIC_OR_DEPARTMENT_OTHER)
Admit: 2019-09-24 | Discharge: 2019-09-24 | Disposition: A | Discharge status: Home Health | Payer: Medicare Other | Attending: Interventional Cardiology | Admitting: Interventional Cardiology

## 2019-09-24 ENCOUNTER — Ambulatory Visit
Admission: RE | Admit: 2019-09-24 | Discharge: 2019-09-24 | Disposition: A | Discharge status: Home / Self Care | Payer: Medicare Other | Attending: Interventional Cardiology | Admitting: Interventional Cardiology

## 2019-09-24 DIAGNOSIS — R079 Chest pain, unspecified: Secondary | ICD-10-CM

## 2019-09-24 DIAGNOSIS — E859 Amyloidosis, unspecified: Secondary | ICD-10-CM

## 2019-09-24 DIAGNOSIS — I5189 Other ill-defined heart diseases: Secondary | ICD-10-CM | POA: Insufficient documentation

## 2019-09-24 MED ORDER — TECHNETIUM TC 99M PYROPHOS IV KIT
26.3000 | PACK | Freq: Once | INTRAVENOUS | Status: AC
Start: 2019-09-24 — End: 2019-09-24
  Administered 2019-09-24: 11:00:00 26.3 via INTRAVENOUS
  Filled 2019-09-24: qty 26.3

## 2019-09-25 NOTE — Progress Notes (Signed)
PYP SPECT does not suggest ATTR amyloid. Serum IF is also benign. Will await stress echo results and consider referral to [facility]

## 2019-10-05 ENCOUNTER — Ambulatory Visit
Admission: RE | Admit: 2019-10-05 | Discharge: 2019-10-05 | Disposition: A | Discharge status: Home / Self Care | Payer: Medicare Other | Attending: Interventional Cardiology | Admitting: Interventional Cardiology

## 2019-10-05 ENCOUNTER — Other Ambulatory Visit: Payer: Self-pay

## 2019-10-05 DIAGNOSIS — E785 Hyperlipidemia, unspecified: Secondary | ICD-10-CM | POA: Insufficient documentation

## 2019-10-05 DIAGNOSIS — I5189 Other ill-defined heart diseases: Secondary | ICD-10-CM | POA: Insufficient documentation

## 2019-10-05 DIAGNOSIS — R079 Chest pain, unspecified: Secondary | ICD-10-CM | POA: Insufficient documentation

## 2019-10-09 ENCOUNTER — Other Ambulatory Visit: Payer: Self-pay | Admitting: Legal Medicine

## 2019-10-11 DIAGNOSIS — G894 Chronic pain syndrome: Secondary | ICD-10-CM | POA: Diagnosis not present

## 2019-10-11 DIAGNOSIS — M546 Pain in thoracic spine: Secondary | ICD-10-CM | POA: Diagnosis not present

## 2019-10-11 DIAGNOSIS — M48061 Spinal stenosis, lumbar region without neurogenic claudication: Secondary | ICD-10-CM | POA: Diagnosis not present

## 2019-10-11 DIAGNOSIS — M5136 Other intervertebral disc degeneration, lumbar region: Secondary | ICD-10-CM | POA: Diagnosis not present

## 2019-10-11 DIAGNOSIS — Z8781 Personal history of (healed) traumatic fracture: Secondary | ICD-10-CM | POA: Diagnosis not present

## 2019-10-11 DIAGNOSIS — M47816 Spondylosis without myelopathy or radiculopathy, lumbar region: Secondary | ICD-10-CM | POA: Diagnosis not present

## 2019-10-15 NOTE — Progress Notes (Signed)
Stress echo normal. Will discuss further next visit

## 2019-10-23 ENCOUNTER — Other Ambulatory Visit: Payer: Self-pay | Admitting: Legal Medicine

## 2019-10-25 ENCOUNTER — Ambulatory Visit (INDEPENDENT_AMBULATORY_CARE_PROVIDER_SITE_OTHER): Payer: Medicare HMO

## 2019-10-25 ENCOUNTER — Other Ambulatory Visit: Payer: Self-pay

## 2019-10-25 VITALS — BP 118/60 | HR 46 | Temp 97.8°F | Resp 16 | Ht 65.0 in | Wt 97.2 lb

## 2019-10-25 DIAGNOSIS — Z87891 Personal history of nicotine dependence: Secondary | ICD-10-CM

## 2019-10-25 DIAGNOSIS — F17219 Nicotine dependence, cigarettes, with unspecified nicotine-induced disorders: Secondary | ICD-10-CM

## 2019-10-25 DIAGNOSIS — Z0000 Encounter for general adult medical examination without abnormal findings: Secondary | ICD-10-CM

## 2019-10-25 NOTE — Patient Instructions (Signed)
 Fall Prevention in the Home, Adult Falls can cause injuries. They can happen to people of all ages. There are many things you can do to make your home safe and to help prevent falls. Ask for help when making these changes, if needed. What actions can I take to prevent falls? General Instructions  Use good lighting in all rooms. Replace any light bulbs that burn out.  Turn on the lights when you go into a dark area. Use night-lights.  Keep items that you use often in easy-to-reach places. Lower the shelves around your home if necessary.  Set up your furniture so you have a clear path. Avoid moving your furniture around.  Do not have throw rugs and other things on the floor that can make you trip.  Avoid walking on wet floors.  If any of your floors are uneven, fix them.  Add color or contrast paint or tape to clearly mark and help you see: ? Any grab bars or handrails. ? First and last steps of stairways. ? Where the edge of each step is.  If you use a stepladder: ? Make sure that it is fully opened. Do not climb a closed stepladder. ? Make sure that both sides of the stepladder are locked into place. ? Ask someone to hold the stepladder for you while you use it.  If there are any pets around you, be aware of where they are. What can I do in the bathroom?      Keep the floor dry. Clean up any water that spills onto the floor as soon as it happens.  Remove soap buildup in the tub or shower regularly.  Use non-skid mats or decals on the floor of the tub or shower.  Attach bath mats securely with double-sided, non-slip rug tape.  If you need to sit down in the shower, use a plastic, non-slip stool.  Install grab bars by the toilet and in the tub and shower. Do not use towel bars as grab bars. What can I do in the bedroom?  Make sure that you have a light by your bed that is easy to reach.  Do not use any sheets or blankets that are too big for your bed. They should  not hang down onto the floor.  Have a firm chair that has side arms. You can use this for support while you get dressed. What can I do in the kitchen?  Clean up any spills right away.  If you need to reach something above you, use a strong step stool that has a grab bar.  Keep electrical cords out of the way.  Do not use floor polish or wax that makes floors slippery. If you must use wax, use non-skid floor wax. What can I do with my stairs?  Do not leave any items on the stairs.  Make sure that you have a light switch at the top of the stairs and the bottom of the stairs. If you do not have them, ask someone to add them for you.  Make sure that there are handrails on both sides of the stairs, and use them. Fix handrails that are broken or loose. Make sure that handrails are as long as the stairways.  Install non-slip stair treads on all stairs in your home.  Avoid having throw rugs at the top or bottom of the stairs. If you do have throw rugs, attach them to the floor with carpet tape.  Choose a carpet that   does not hide the edge of the steps on the stairway.  Check any carpeting to make sure that it is firmly attached to the stairs. Fix any carpet that is loose or worn. What can I do on the outside of my home?  Use bright outdoor lighting.  Regularly fix the edges of walkways and driveways and fix any cracks.  Remove anything that might make you trip as you walk through a door, such as a raised step or threshold.  Trim any bushes or trees on the path to your home.  Regularly check to see if handrails are loose or broken. Make sure that both sides of any steps have handrails.  Install guardrails along the edges of any raised decks and porches.  Clear walking paths of anything that might make someone trip, such as tools or rocks.  Have any leaves, snow, or ice cleared regularly.  Use sand or salt on walking paths during winter.  Clean up any spills in your garage right  away. This includes grease or oil spills. What other actions can I take?  Wear shoes that: ? Have a low heel. Do not wear high heels. ? Have rubber bottoms. ? Are comfortable and fit you well. ? Are closed at the toe. Do not wear open-toe sandals.  Use tools that help you move around (mobility aids) if they are needed. These include: ? Canes. ? Walkers. ? Scooters. ? Crutches.  Review your medicines with your doctor. Some medicines can make you feel dizzy. This can increase your chance of falling. Ask your doctor what other things you can do to help prevent falls. Where to find more information  Centers for Disease Control and Prevention, [employer]: https://cdc.gov  National Institute on Aging: https://go4life.nia.nih.gov Contact a doctor if:  You are afraid of falling at home.  You feel weak, drowsy, or dizzy at home.  You fall at home. Summary  There are many simple things that you can do to make your home safe and to help prevent falls.  Ways to make your home safe include removing tripping hazards and installing grab bars in the bathroom.  Ask for help when making these changes in your home. This information is not intended to replace advice given to you by your health care provider. Make sure you discuss any questions you have with your health care provider. Document Revised: 07/27/2018 Document Reviewed: 11/18/2016 Elsevier Patient Education  2020 Elsevier Inc.   Health Maintenance, Female Adopting a healthy lifestyle and getting preventive care are important in promoting health and wellness. Ask your health care provider about:  The right schedule for you to have regular tests and exams.  Things you can do on your own to prevent diseases and keep yourself healthy. What should I know about diet, weight, and exercise? Eat a healthy diet   Eat a diet that includes plenty of vegetables, fruits, low-fat dairy products, and lean protein.  Do not eat a lot of foods  that are high in solid fats, added sugars, or sodium. Maintain a healthy weight Body mass index (BMI) is used to identify weight problems. It estimates body fat based on height and weight. Your health care provider can help determine your BMI and help you achieve or maintain a healthy weight. Get regular exercise Get regular exercise. This is one of the most important things you can do for your health. Most adults should:  Exercise for at least 150 minutes each week. The exercise should increase your heart rate   and make you sweat (moderate-intensity exercise).  Do strengthening exercises at least twice a week. This is in addition to the moderate-intensity exercise.  Spend less time sitting. Even light physical activity can be beneficial. Watch cholesterol and blood lipids Have your blood tested for lipids and cholesterol at 61 years of age, then have this test every 5 years. Have your cholesterol levels checked more often if:  Your lipid or cholesterol levels are high.  You are older than 61 years of age.  You are at high risk for heart disease. What should I know about cancer screening? Depending on your health history and family history, you may need to have cancer screening at various ages. This may include screening for:  Breast cancer.  Cervical cancer.  Colorectal cancer.  Skin cancer.  Lung cancer. What should I know about heart disease, diabetes, and high blood pressure? Blood pressure and heart disease  High blood pressure causes heart disease and increases the risk of stroke. This is more likely to develop in people who have high blood pressure readings, are of African descent, or are overweight.  Have your blood pressure checked: ? Every 3-5 years if you are 18-39 years of age. ? Every year if you are 40 years old or older. Diabetes Have regular diabetes screenings. This checks your fasting blood sugar level. Have the screening done:  Once every three years after  age 40 if you are at a normal weight and have a low risk for diabetes.  More often and at a younger age if you are overweight or have a high risk for diabetes. What should I know about preventing infection? Hepatitis B If you have a higher risk for hepatitis B, you should be screened for this virus. Talk with your health care provider to find out if you are at risk for hepatitis B infection. Hepatitis C Testing is recommended for:  Everyone born from 1945 through 1965.  Anyone with known risk factors for hepatitis C. Sexually transmitted infections (STIs)  Get screened for STIs, including gonorrhea and chlamydia, if: ? You are sexually active and are younger than 61 years of age. ? You are older than 61 years of age and your health care provider tells you that you are at risk for this type of infection. ? Your sexual activity has changed since you were last screened, and you are at increased risk for chlamydia or gonorrhea. Ask your health care provider if you are at risk.  Ask your health care provider about whether you are at high risk for HIV. Your health care provider may recommend a prescription medicine to help prevent HIV infection. If you choose to take medicine to prevent HIV, you should first get tested for HIV. You should then be tested every 3 months for as long as you are taking the medicine. Pregnancy  If you are about to stop having your period (premenopausal) and you may become pregnant, seek counseling before you get pregnant.  Take 400 to 800 micrograms (mcg) of folic acid every day if you become pregnant.  Ask for birth control (contraception) if you want to prevent pregnancy. Osteoporosis and menopause Osteoporosis is a disease in which the bones lose minerals and strength with aging. This can result in bone fractures. If you are 65 years old or older, or if you are at risk for osteoporosis and fractures, ask your health care provider if you should:  Be screened for  bone loss.  Take a calcium or vitamin   D supplement to lower your risk of fractures.  Be given hormone replacement therapy (HRT) to treat symptoms of menopause. Follow these instructions at home: Lifestyle  Do not use any products that contain nicotine or tobacco, such as cigarettes, e-cigarettes, and chewing tobacco. If you need help quitting, ask your health care provider.  Do not use street drugs.  Do not share needles.  Ask your health care provider for help if you need support or information about quitting drugs. Alcohol use  Do not drink alcohol if: ? Your health care provider tells you not to drink. ? You are pregnant, may be pregnant, or are planning to become pregnant.  If you drink alcohol: ? Limit how much you use to 0-1 drink a day. ? Limit intake if you are breastfeeding.  Be aware of how much alcohol is in your drink. In the [place] one drink equals one 12 oz bottle of beer (355 mL), one 5 oz glass of wine (148 mL), or one 1 oz glass of hard liquor (44 mL). General instructions  Schedule regular health, dental, and eye exams.  Stay current with your vaccines.  Tell your health care provider if: ? You often feel depressed. ? You have ever been abused or do not feel safe at home. Summary  Adopting a healthy lifestyle and getting preventive care are important in promoting health and wellness.  Follow your health care provider's instructions about healthy diet, exercising, and getting tested or screened for diseases.  Follow your health care provider's instructions on monitoring your cholesterol and blood pressure. This information is not intended to replace advice given to you by your health care provider. Make sure you discuss any questions you have with your health care provider. Document Revised: 03/29/2018 Document Reviewed: 03/29/2018 Elsevier Patient Education  2020 Elsevier Inc.   Steps to Quit Smoking Smoking tobacco is the leading cause of  preventable death. It can affect almost every organ in the body. Smoking puts you and people around you at risk for many serious, long-lasting (chronic) diseases. Quitting smoking can be hard, but it is one of the best things that you can do for your health. It is never too late to quit. How do I get ready to quit? When you decide to quit smoking, make a plan to help you succeed. Before you quit:  Pick a date to quit. Set a date within the next 2 weeks to give you time to prepare.  Write down the reasons why you are quitting. Keep this list in places where you will see it often.  Tell your family, friends, and co-workers that you are quitting. Their support is important.  Talk with your doctor about the choices that may help you quit.  Find out if your health insurance will pay for these treatments.  Know the people, places, things, and activities that make you want to smoke (triggers). Avoid them. What first steps can I take to quit smoking?  Throw away all cigarettes at home, at work, and in your car.  Throw away the things that you use when you smoke, such as ashtrays and lighters.  Clean your car. Make sure to empty the ashtray.  Clean your home, including curtains and carpets. What can I do to help me quit smoking? Talk with your doctor about taking medicines and seeing a counselor at the same time. You are more likely to succeed when you do both.  If you are pregnant or breastfeeding, talk with your doctor about   counseling or other ways to quit smoking. Do not take medicine to help you quit smoking unless your doctor tells you to do so. To quit smoking: Quit right away  Quit smoking totally, instead of slowly cutting back on how much you smoke over a period of time.  Go to counseling. You are more likely to quit if you go to counseling sessions regularly. Take medicine You may take medicines to help you quit. Some medicines need a prescription, and some you can buy  over-the-counter. Some medicines may contain a drug called nicotine to replace the nicotine in cigarettes. Medicines may:  Help you to stop having the desire to smoke (cravings).  Help to stop the problems that come when you stop smoking (withdrawal symptoms). Your doctor may ask you to use:  Nicotine patches, gum, or lozenges.  Nicotine inhalers or sprays.  Non-nicotine medicine that is taken by mouth. Find resources Find resources and other ways to help you quit smoking and remain smoke-free after you quit. These resources are most helpful when you use them often. They include:  Online chats with a Veterinary surgeon.  Phone quitlines.  Printed Materials engineer.  Support groups or group counseling.  Text messaging programs.  Mobile phone apps. Use apps on your mobile phone or tablet that can help you stick to your quit plan. There are many free apps for mobile phones and tablets as well as websites. Examples include Quit Guide from the [employer] and smokefree.gov  What things can I do to make it easier to quit?   Talk to your family and friends. Ask them to support and encourage you.  Call a phone quitline (1-800-QUIT-NOW), reach out to support groups, or work with a Veterinary surgeon.  Ask people who smoke to not smoke around you.  Avoid places that make you want to smoke, such as: ? Bars. ? Parties. ? Smoke-break areas at work.  Spend time with people who do not smoke.  Lower the stress in your life. Stress can make you want to smoke. Try these things to help your stress: ? Getting regular exercise. ? Doing deep-breathing exercises. ? Doing yoga. ? Meditating. ? Doing a body scan. To do this, close your eyes, focus on one area of your body at a time from head to toe. Notice which parts of your body are tense. Try to relax the muscles in those areas. How will I feel when I quit smoking? Day 1 to 3 weeks Within the first 24 hours, you may start to have some problems that come from  quitting tobacco. These problems are very bad 2-3 days after you quit, but they do not often last for more than 2-3 weeks. You may get these symptoms:  Mood swings.  Feeling restless, nervous, angry, or annoyed.  Trouble concentrating.  Dizziness.  Strong desire for high-sugar foods and nicotine.  Weight gain.  Trouble pooping (constipation).  Feeling like you may vomit (nausea).  Coughing or a sore throat.  Changes in how the medicines that you take for other issues work in your body.  Depression.  Trouble sleeping (insomnia). Week 3 and afterward After the first 2-3 weeks of quitting, you may start to notice more positive results, such as:  Better sense of smell and taste.  Less coughing and sore throat.  Slower heart rate.  Lower blood pressure.  Clearer skin.  Better breathing.  Fewer sick days. Quitting smoking can be hard. Do not give up if you fail the first time. Some people  try a few times before they succeed. Do your best to stick to your quit plan, and talk with your doctor if you have any questions or concerns. Summary  Smoking tobacco is the leading cause of preventable death. Quitting smoking can be hard, but it is one of the best things that you can do for your health.  When you decide to quit smoking, make a plan to help you succeed.  Quit smoking right away, not slowly over a period of time.  When you start quitting, seek help from your doctor, family, or friends. This information is not intended to replace advice given to you by your health care provider. Make sure you discuss any questions you have with your health care provider. Document Revised: 12/29/2018 Document Reviewed: 06/24/2018 Elsevier Patient Education  2020 Elsevier Inc.  
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
## 2019-11-02 DIAGNOSIS — Z87891 Personal history of nicotine dependence: Secondary | ICD-10-CM | POA: Diagnosis not present

## 2019-11-06 DIAGNOSIS — I10 Essential (primary) hypertension: Secondary | ICD-10-CM | POA: Diagnosis not present

## 2019-11-06 DIAGNOSIS — E785 Hyperlipidemia, unspecified: Secondary | ICD-10-CM | POA: Diagnosis not present

## 2019-11-06 DIAGNOSIS — F17200 Nicotine dependence, unspecified, uncomplicated: Secondary | ICD-10-CM | POA: Diagnosis not present

## 2019-11-06 DIAGNOSIS — I6523 Occlusion and stenosis of bilateral carotid arteries: Secondary | ICD-10-CM | POA: Diagnosis not present

## 2019-11-08 ENCOUNTER — Other Ambulatory Visit: Payer: Self-pay

## 2019-11-08 ENCOUNTER — Telehealth: Payer: Self-pay

## 2019-11-08 DIAGNOSIS — M48061 Spinal stenosis, lumbar region without neurogenic claudication: Secondary | ICD-10-CM | POA: Diagnosis not present

## 2019-11-08 DIAGNOSIS — G894 Chronic pain syndrome: Secondary | ICD-10-CM | POA: Diagnosis not present

## 2019-11-08 DIAGNOSIS — M461 Sacroiliitis, not elsewhere classified: Secondary | ICD-10-CM | POA: Diagnosis not present

## 2019-11-08 DIAGNOSIS — M5136 Other intervertebral disc degeneration, lumbar region: Secondary | ICD-10-CM | POA: Diagnosis not present

## 2019-11-08 DIAGNOSIS — Z87891 Personal history of nicotine dependence: Secondary | ICD-10-CM

## 2019-11-08 DIAGNOSIS — F17219 Nicotine dependence, cigarettes, with unspecified nicotine-induced disorders: Secondary | ICD-10-CM

## 2019-11-08 DIAGNOSIS — Z1389 Encounter for screening for other disorder: Secondary | ICD-10-CM | POA: Diagnosis not present

## 2019-11-08 DIAGNOSIS — M47816 Spondylosis without myelopathy or radiculopathy, lumbar region: Secondary | ICD-10-CM | POA: Diagnosis not present

## 2019-11-08 NOTE — Telephone Encounter (Signed)
I gave the results of CT scan.

## 2019-11-16 ENCOUNTER — Encounter (INDEPENDENT_AMBULATORY_CARE_PROVIDER_SITE_OTHER): Payer: Medicare Other | Admitting: Internal Medicine

## 2019-12-05 ENCOUNTER — Encounter (HOSPITAL_BASED_OUTPATIENT_CLINIC_OR_DEPARTMENT_OTHER): Payer: Self-pay | Admitting: Neurology

## 2019-12-05 ENCOUNTER — Encounter (INDEPENDENT_AMBULATORY_CARE_PROVIDER_SITE_OTHER): Payer: Self-pay | Admitting: Family Practice

## 2019-12-05 DIAGNOSIS — Z1159 Encounter for screening for other viral diseases: Secondary | ICD-10-CM

## 2019-12-06 DIAGNOSIS — M47816 Spondylosis without myelopathy or radiculopathy, lumbar region: Secondary | ICD-10-CM | POA: Diagnosis not present

## 2019-12-06 DIAGNOSIS — G894 Chronic pain syndrome: Secondary | ICD-10-CM | POA: Diagnosis not present

## 2019-12-06 DIAGNOSIS — M5136 Other intervertebral disc degeneration, lumbar region: Secondary | ICD-10-CM | POA: Diagnosis not present

## 2019-12-06 DIAGNOSIS — Z1389 Encounter for screening for other disorder: Secondary | ICD-10-CM | POA: Diagnosis not present

## 2019-12-06 DIAGNOSIS — M48061 Spinal stenosis, lumbar region without neurogenic claudication: Secondary | ICD-10-CM | POA: Diagnosis not present

## 2019-12-06 DIAGNOSIS — M461 Sacroiliitis, not elsewhere classified: Secondary | ICD-10-CM | POA: Diagnosis not present

## 2019-12-09 ENCOUNTER — Encounter (INDEPENDENT_AMBULATORY_CARE_PROVIDER_SITE_OTHER): Payer: Self-pay | Admitting: Family Practice

## 2019-12-10 ENCOUNTER — Encounter (INDEPENDENT_AMBULATORY_CARE_PROVIDER_SITE_OTHER): Payer: Self-pay | Admitting: Family Practice

## 2019-12-10 ENCOUNTER — Telehealth (INDEPENDENT_AMBULATORY_CARE_PROVIDER_SITE_OTHER): Payer: Medicare Other | Admitting: Family Practice

## 2019-12-10 ENCOUNTER — Other Ambulatory Visit: Payer: Self-pay

## 2019-12-10 ENCOUNTER — Telehealth (HOSPITAL_BASED_OUTPATIENT_CLINIC_OR_DEPARTMENT_OTHER): Payer: Self-pay | Admitting: Family Practice

## 2019-12-10 DIAGNOSIS — U071 COVID-19: Secondary | ICD-10-CM

## 2019-12-10 DIAGNOSIS — G35 Multiple sclerosis: Secondary | ICD-10-CM

## 2019-12-10 MED ORDER — BENZONATATE 100 MG OR CAPS
100.0000 mg | ORAL_CAPSULE | Freq: Three times a day (TID) | ORAL | 2 refills | Status: DC | PRN
Start: 2019-12-10 — End: 2020-05-12

## 2019-12-10 NOTE — Interdisciplinary (Signed)
Spoke with patient, verified name and DOB, patient acknowledged they are in [place] at the time of this telehealth appointment.

## 2019-12-10 NOTE — Telephone Encounter (Signed)
Debra Bolton, could you please have your clinic COVID F/U TEAM reach out to her. She is COVID+

## 2019-12-10 NOTE — Telephone Encounter (Signed)
Pt is speaking in full sentences on phone, states has been really sick though for past 7 days, has a bad cough and fevers, denies severe SOB or chest pain at this time, offered MCVV but states she does not have good internet or mobile phone connection, prefers telephone call appt, RN reviewed if symptoms worsen, or if provider has concerns, she may be sent to [facility] or ER for evaluation.  She is interested in monoclonal antibody treatment if possible.    appt made:  Future Appointments   Date Time Provider [place]   12/10/2019  1:00 PM Leana Gamer, MD [place]

## 2019-12-10 NOTE — Telephone Encounter (Signed)
Forwarding to nurse triage

## 2019-12-10 NOTE — Telephone Encounter (Signed)
From: Debra Bolton  To: Ottis Vacha Kristen Loader, MD  Sent: 12/05/2019 10:30 AM PDT  Subject: 20-Other    Dear Dr. Rosendo Gros   I am living with my partner, Ronalee Belts, and he has just rapid tested for Covid. He never got a vaccine. I did get a vaccine Therapist, music) but also received a infusion for the MS (rituxan) 2 weeks after my vaccines.  So now what should I do, we have been in the same home, but staying apart. I'm a bit overwhelmed, do I need another shot? do I need to go to a hotel? So discouraged and fearful. As usual thanks for any input!  Debra Bolton

## 2019-12-10 NOTE — Progress Notes (Signed)
Patient Verification & Telemedicine Consent:    I am proceeding with this evaluation at the direct request of the patient.  I have verified this is the correct patient and have obtained verbal consent and written consent from the patient/ surrogate to perform this voluntary telemedicine evaluation (including obtaining history, performing examination and reviewing data provided by the patient).   The patient/ surrogate has the right to refuse this evaluation.  I have explained risks (including potential loss of confidentiality), benefits, alternatives, and the potential need for subsequent face to face care. Patient/ surrogate understands that there is a risk of medical inaccuracies given that our recommendations will be made based on reported data (and we must therefore assume this information is accurate).  Knowing that there is a risk that this information is not reported accurately, and that the telemedicine video, audio, or data feed may be incomplete, the patient agrees to proceed with evaluation and holds [place] harmless knowing these risks. In this evaluation, we will be providing recommendations only. The patient/ surrogate has been notified that other healthcare professionals (including students, residents and Metallurgist) may be involved in this audio-video evaluation.   All laws concerning confidentiality and patient access to medical records and copies of medical records apply to telemedicine.  The patient/ surrogate has received the [place] Notice of Privacy Practices.  I have reviewed this above verification and consent paragraph with the patient/ surrogate.  If the patient is not capacitated to understand the above, and no surrogate is available, since this is not an emergency evaluation, the visit will be rescheduled until such time that the patient can consent, or the surrogate is available to consent.    Demographics:   Medical Record #: [id number]   Date: December 10, 2019   Patient Name: Debra Bolton   DOB: 09/25/1960  Age: 59 year old  Sex: female  Location: Home address on file        COVID summary  YES completed COVID vaccination   Exposure History: person she lives with is positive, they tested positive 8/16  Date Symptoms Began (day zero): 12/04/2019  Date of Positive Test: 12/07/2019  People living with patient and COVID status: partner also has [place]  Potential last day of Home Isolation date (day 10) : 12/14/2019  Had test done in [facility], was set up for monoclonal antibody infusion tomorrow at home.      Positive COVID-19 PCR 1st Call to Primary Care Patient (NOT [place])    Lab Results   Component Value Date    CO19 Not Detected 09/03/2019       Symptom Assessment    Current symptoms:  Cough, fever, headache      Is the patient short of breath, having difficulty breathing, or experiencing chest pain?  no   Is the patient coughing?  yes - productive    What have the measured temperature at home in the last 24 hours?  elevated to 101-103F degrees   Does the patient have fatigue or sound sick or weak? yes - feels sore   Is the patient answering questions appropriately without evidence of confusion? yes   Does the patient have any vomiting or diarrhea?  Diarrhea started yesterday   Can you tolerate liquids? yes   Does the patient need ED referral?  no   Has MS, gets rituximab infusions, last was April, gets them every 6 months usually         ASSESSMENT/PLAN:  There  are no diagnoses linked to this encounter.        Isolation:    [x] Reviewed the 3 criteria, that ALL must be met prior to discharge from home isolation with the patient? no  a. No fever for at least 24 hours (without medication to reduce fevers)  b. Other symptoms have improved (eg. cough or shortness of breath have improved)  c. At least 10 days have passed since symptoms first appeared (20 days for immunosuppressed or severe  illness)    [x] Recommended frequency of monitoring: daily      [x] Reviewed  home isolation instructions with patient yes  [x] Send Mychart using smartprhase .[email or web address]        Monitoring and Discharge:  [x] Did you inform patient that they may receive phone calls checking in on the patients status from [facility] and [place]? yes    [x] Did you Inform patient that [facility] team or the [place] [facility] Results team] will be reaching out to patient on a regular basis to ensure that their questions are answered, check on their status and discharge them from isolation? yes    [] Order mychart COVID-19 home monitoring program-unable to easily use mychart   [] Send mychart to patient instructions for home monitoring program: .fmcovidcarecompinstructions    Referral and Notify App:    11. Infectious Disease referral? YES( ID referral is particularly useful for patients who are high risk, elderly, multi co-morbidities) Outside of window but has MS.  Also already set up for home infusion for mAB in [facility]     12. Did the patient download and are they participating in the [place]? no  If yes, please send secure chat for code activation to: Drs Sharrell Ku, Adah Perl ([place]) Driscilla Grammes ([place]) [facility])      Deshannon Hinchliffe Launa Grill, MD  Signature Derived From Controlled Access [place], December 10, 2019, 1:03 PM

## 2019-12-10 NOTE — Patient Instructions (Addendum)
Isolation information for Patients  (updated 03/22/2019)    Your health care provider will evaluate whether you can be cared for at home. If it is determined that you do not need hospitalization and can be isolated at home, you will be monitored by your health care provider.     You should follow the prevention steps below and even if your test is negative follow these guidelines including waiting to leave home until you are no longer contagious as per instructions at end of this document. Please follow any additional instructions for return to your workplace if you are an essential worker.    Contact our dedicated nurse line [phone] if you are noting worsening respiratory symptoms and need advice. This line is open 8 am -5 pm M-F days a week, on the weekend please call your physician or go Emergency Department.  If sudden and severe worsening in symptoms please call 911 and let them know you are being tested or are COVID-19 positive.        Stay home except to get medical care  [facility] has a Health order for quarantine and it is a misdemeanor if you are not following:    https://www.[place].gov/content/dam/sdc/hhsa/programs/phs/Epidemiology/covid19/HealthOfficerOrder-Isolation.pdf  . You should do no activities outside your home, except for getting medical care. Do not go to work, school, or public areas. Do not use public transportation, ride-sharing, or taxis.  . If you have a medical appointment, call the healthcare provider and tell them that you have or may have COVID-19. This will help the healthcare provider's office take steps to keep other people from getting infected or exposed.  ; Have all essential items (eg groceries) delivered to your home and left at your doorstep. If you need assistance with this, call 211 to learn about available services.    Separate yourself from other people and animals in your home  . People: As much as possible, stay in a specific room and away from other people  in your home. If available, you should use a separate bathroom.  . Animals: Restrict contact with pets and other animals while you are sick with COVID-19, just like you would around other people. Avoid petting, snuggling, being kissed or licked, and sharing food with pets. When possible, have another member of your household care for your animals while you are sick. If you must care for your pet or be around animals while you are sick, wash your hands before and after you interact with them and wear a facemask.     Notify contacts of your illness if you test positive:  . Because people infected with COVID can spread the illness before they develop symptoms, you need to make a list of all people you've been in contact with from 48 hours prior to your first symptom through until you started home isolation.  . Contact the people on this list and inform them of your COVID19 diagnosis or potential diagnosis if you have symptoms but are not tested.   . Inform all of your contacts that they need to quarantine themselves in their homes for 14 days from the last point of contact.  They may leave their homes only to get medical care.    . If any of your contacts are an essential worker, they should contact their employer about their return to work policy.  If their employer does not have a policy, they may follow the CDC guidelines for exposed essential workers:  https://www.cdc.gov/coronavirus/2019-ncov/community/critical-workers/implementing-safety-practices.html     Wear   a facemask   . You should wear a facemask when you are around other people (e.g., sharing a room or vehicle) or pets and before you enter a health care provider's office. If you are unable to wear a facemask (for example, because it causes trouble breathing), then people who live with you should not stay in the same room with you, or they should wear a facemask if they enter your room.    Cover your coughs and sneezes  . Cover your mouth and nose with a  tissue when you cough or sneeze. Throw used tissues in a lined trash can; immediately wash your hands with soap and water for at least 20 seconds or clean your hands with an alcohol-based hand sanitizer that contains 60 to 95% alcohol, covering all surfaces of your hands and rubbing them together until they feel dry. Soap and water should be used preferentially if hands are visibly dirty.    Clean your hands often  . Wash your hands often with soap and water for at least 20 seconds or clean your hands with an alcohol-based hand sanitizer that contains 60 to 95% alcohol, covering all surfaces of your hands and rubbing them together until they feel dry. Soap and water should be used preferentially if hands are visibly dirty. Avoid touching your eyes, nose, and mouth with unwashed hands.    Avoid sharing personal household items  . You should not share dishes, drinking glasses, cups, eating utensils, towels, or bedding with other people or pets in your home. After using these items, they should be washed thoroughly with soap and water.    Clean all "high-touch" surfaces everyday  . High touch surfaces include counters, tabletops, doorknobs, bathroom fixtures, toilets, phones, keyboards, tablets, and bedside tables. Also, clean any surfaces that may have blood, stool, or bodily fluids on them. Use a household cleaning spray or wipe, according to the label instructions. Labels contain instructions for safe and effective use of the cleaning product including precautions you should take when applying the product, such as wearing gloves and making sure you have good ventilation during use of the product.    Monitor your symptoms  . Seek prompt medical attention if your illness is worsening (e.g., difficulty breathing). Before seeking care, call your health care provider and tell them that you have, or are being evaluated for, COVID-19. Put on a facemask if you have one before you enter the facility. These steps will help  the health care provider's office to keep other people in the office or waiting room from being infected or exposed.   Persons who are placed under active monitoring or facilitated self-monitoring should follow instructions provided by their local health department or occupational health professionals, as appropriate.  . If you have a medical emergency and need to call 911, notify the dispatch personnel that you have, or are being evaluated for COVID-19. If possible, put on a facemask before emergency medical services arrive.    Discontinuing home isolation  Patients with confirmed COVID-19 or with respiratory symptoms and a negative COVID-19 test should remain under home isolation precautions until the following three things have happened:    1. You have had no fever for at least 24 hours (that is one full day of no fever without the use medicine that reduces fevers)  AND  2. Other symptoms have improved (for example, when your cough or shortness of breath have improved)  AND  3. At least 10 days have passed since   your symptoms first appeared    Patients who have been exposed to someone with COVID-19 infection are required to quarantine.  Your quarantine can end when one of the following is true:  1. 14 days have passed since you were exposed and you have no symptoms. This is the approach that minimizes the risk that you could develop COVID-19 and that you infect another person (less than 1% chance of spread), and is the strongly recommended approach.   or  2. 10 days have passed since you were exposed and you have no symptoms.  There is about a 1% chance that you could develop COVID-19 and that you infect another person with this approach.   or  3. 7 days have passed since you were exposed, and you have tested negative with a COVID PCR test at least 5 days after your exposure.  There is about a 5% chance that you could develop COVID-19 and that you infect another person with this approach.      If you are an  essential worker, please contact your employer after you meet the above criteria to discuss their specific return to work policy. Please abide by their policies.    Please go to https://www.cdc.gov/coronavirus/2019-ncov/if-you-are-sick/index.html for additional information.

## 2019-12-10 NOTE — Telephone Encounter (Signed)
REFERRAL TO COVID-19 CLINIC     Date: December 10, 2019   Patient Name: Debra Bolton   DOB: 09/07/1960  Age: 59 year old    Received referral for COVID-19 clinic on: /20   Contacted patient to review and schedule COVID-19 Clinic appointment.   Patient declines clinic appointment for following reason (check all that apply)  [x ] Other: Patient is receiving care at an outside facility

## 2019-12-11 ENCOUNTER — Telehealth (INDEPENDENT_AMBULATORY_CARE_PROVIDER_SITE_OTHER): Payer: Medicare Other | Admitting: Family Practice

## 2019-12-11 ENCOUNTER — Encounter (INDEPENDENT_AMBULATORY_CARE_PROVIDER_SITE_OTHER): Payer: Self-pay | Admitting: Family Practice

## 2019-12-11 DIAGNOSIS — U071 COVID-19: Secondary | ICD-10-CM

## 2019-12-11 NOTE — Progress Notes (Signed)
Patient Verification & Telemedicine Consent:    I am proceeding with this evaluation at the direct request of the patient.  I have verified this is the correct patient and have obtained verbal consent and written consent from the patient/ surrogate to perform this voluntary telemedicine evaluation (including obtaining history, performing examination and reviewing data provided by the patient).   The patient/ surrogate has the right to refuse this evaluation.  I have explained risks (including potential loss of confidentiality), benefits, alternatives, and the potential need for subsequent face to face care. Patient/ surrogate understands that there is a risk of medical inaccuracies given that our recommendations will be made based on reported data (and we must therefore assume this information is accurate).  Knowing that there is a risk that this information is not reported accurately, and that the telemedicine video, audio, or data feed may be incomplete, the patient agrees to proceed with evaluation and holds [place] harmless knowing these risks. In this evaluation, we will be providing recommendations only. The patient/ surrogate has been notified that other healthcare professionals (including students, residents and Metallurgist) may be involved in this audio-video evaluation.   All laws concerning confidentiality and patient access to medical records and copies of medical records apply to telemedicine.  The patient/ surrogate has received the [place] Notice of Privacy Practices.  I have reviewed this above verification and consent paragraph with the patient/ surrogate.  If the patient is not capacitated to understand the above, and no surrogate is available, since this is not an emergency evaluation, the visit will be rescheduled until such time that the patient can consent, or the surrogate is available to consent.    Demographics:   Medical Record #: [id number]   Date: December 11, 2019   Patient Name: Debra Bolton   DOB: February 26, 1961  Age: 59 year old  Sex: female  Location: Home address on file        COVID summary  YES completed COVID vaccination   Exposure History: partner she lives with is positive, they tested positive 8/16  Date Symptoms Began (day zero): 12/04/2019  Date of Positive Test: 12/07/2019  People living with patient and COVID status: partner also has [place]  Potential last day of Home Isolation date (day 10) : 12/14/2019  Had test done in [facility], was set up for monoclonal antibody infusion tomorrow at home.        Lab Results   Component Value Date    CO19 Not Detected 09/03/2019       Symptom Assessment    Current symptoms:  Cough, fever, headache- feels about the same  - using tylenol and advil which helps with pain     Is the patient short of breath, having difficulty breathing, or experiencing chest pain?  no   Is the patient coughing?  yes - productive    What have the measured temperature at home in the last 24 hours?  elevated to 101F degrees   Does the patient have fatigue or sound sick or weak? yes - feels sore   Is the patient answering questions appropriately without evidence of confusion? yes   Does the patient have any vomiting or diarrhea?  Diarrhea started yesterday   Can you tolerate liquids? yes   Does the patient need ED referral?  no   Has MS, gets rituximab infusions, last was April, gets them every 6 months usually         ASSESSMENT/PLAN:  Debra Bolton was seen today  for covid-19 re-check / follow up, fever, generalized body aches and fatigue.    Diagnoses and all orders for this visit:    COVID-19 virus infection            Isolation:    [x] Reviewed the 3 criteria, that ALL must be met prior to discharge from home isolation with the patient? no  a. No fever for at least 24 hours (without medication to reduce fevers)  b. Other symptoms have improved (eg. cough or shortness of breath have improved)  c. At least 10 days have passed since symptoms first appeared (20 days for  immunosuppressed or severe illness)    [x] Recommended frequency of monitoring: daily    [x] Reviewed home isolation instructions with patient yes  [x] Send Mychart using smartprhase .covid19homeisolationinstructions        Monitoring and Discharge:  [x] Did you inform patient that they may receive phone calls checking in on the patients status from [facility] and [place]? yes    [x] Did you Inform patient that [facility] team or the [place] Results team] will be reaching out to patient on a regular basis to ensure that their questions are answered, check on their status and discharge them from isolation? yes    [] Order mychart COVID-19 home monitoring program-unable to easily use mychart   [] Send mychart to patient instructions for home monitoring program: .fmcovidcarecompinstructions    Referral and Notify App:    11. Infectious Disease referral? YES( ID referral is particularly useful for patients who are high risk, elderly, multi co-morbidities) Outside of window but has MS.  Also already set up for home infusion for mAB in [facility].  - ID reached out to patient yesterday, she said she was getting care with outside provider, she is planning to proceed with mab infusion      12. Did the patient download and are they participating in the [place]? no  If yes, please send secure chat for code activation to: Drs Sharrell Ku, Adah Perl ([place]) Driscilla Grammes ([place]) [facility])      Matas Burrows Launa Grill, MD  Signature Derived From Controlled Access [place], December 11, 2019

## 2019-12-13 ENCOUNTER — Encounter (INDEPENDENT_AMBULATORY_CARE_PROVIDER_SITE_OTHER): Payer: Self-pay | Admitting: Family Medicine

## 2019-12-13 ENCOUNTER — Telehealth (INDEPENDENT_AMBULATORY_CARE_PROVIDER_SITE_OTHER): Payer: Medicare Other | Admitting: Family Medicine

## 2019-12-13 DIAGNOSIS — U071 COVID-19: Secondary | ICD-10-CM

## 2019-12-13 DIAGNOSIS — R05 Cough: Secondary | ICD-10-CM

## 2019-12-13 NOTE — Progress Notes (Signed)
COVID-19 Positive Daily Check In    ---------------------(data below generated by Vinnie Level, MD)--------------------    Patient Verification & Telemedicine Consent:    I am proceeding with this evaluation at the direct request of the patient.  I have verified this is the correct patient and have obtained verbal consent and written consent from the patient/ surrogate to perform this voluntary telemedicine evaluation (including obtaining history, performing examination and reviewing data provided by the patient).   The patient/ surrogate has the right to refuse this evaluation.  I have explained risks (including potential loss of confidentiality), benefits, alternatives, and the potential need for subsequent face to face care. Patient/ surrogate understands that there is a risk of medical inaccuracies given that our recommendations will be made based on reported data (and we must therefore assume this information is accurate).  Knowing that there is a risk that this information is not reported accurately, and that the telemedicine video, audio, or data feed may be incomplete, the patient agrees to proceed with evaluation and holds [place] harmless knowing these risks. In this evaluation, we will be providing recommendations only. The patient/ surrogate has been notified that other healthcare professionals (including students, residents and Metallurgist) may be involved in this audio-video evaluation.   All laws concerning confidentiality and patient access to medical records and copies of medical records apply to telemedicine.  The patient/ surrogate has received the [place] Notice of Privacy Practices.  I have reviewed this above verification and consent paragraph with the patient/ surrogate.  If the patient is not capacitated to understand the above, and no surrogate is available, since this is not an emergency evaluation, the visit will be rescheduled until such time that the patient can consent, or  the surrogate is available to consent.    Demographics:   Medical Record #: [id number]   Date: December 13, 2019   Patient Name: Debra Bolton   DOB: [birth year]  Age: 59 year old  Sex: female  Location: Home address on file    Evaluator(s):   Towana Kerwin was evaluated by me today.    Clinic Location: [facility]  [address]  [place] [address]    COVID SUMMARY:  YES completed COVID vaccination   Exposure History:partner she lives with is positive, they tested positive 8/16  Date Symptoms Began (day zero):12/04/2019  Date of Positive Test:12/07/2019  People living with patient and COVID status: partner also has COVID19  Potential last day of Home Isolation date: 9/6 (extended to 20 days due to immunocompromised status with MS)  - s/p monoclonal antibody infusion yesterday, 8/25  Recommended Monitoring Frequency: qday   Updated blue sticky note yes    Lab Results   Component Value Date    CO19 Not Detected 09/03/2019       SYMPTOM REASSESSMENT:    Current Symptoms: body aches, fatigue, cough     Is the patient short of breath, having difficulty breathing, or experiencing chest pain?  no   Is the patient coughing?  yes - yellow mucus   What have the measured temperature at home in the last 24 hours? Hasn't checked, denies subjective fevers   Does the patient have fatigue or sound sick or weak? no   Is the patient answering questions appropriately without evidence of confusion? no   Does the patient have any vomiting or diarrhea?  no   Can you tolerate liquids? yes  Does the patient need ED referral?  no    Flowsheet data (if applicable):   No flowsheet data found.      ASSESSMENT/PLAN:  Nereyda was seen today for covid-19 re-check / follow up.    COVID-19 virus infection  S/p monoclonal Ab infusion yesterday, on day 10 of symptoms today, doing well overall. Still has body aches, productive cough, but denies SOB, fevers, CP, etc. Partner also  positive but was unvaccinated, he has no underlying medical conditions, no PCP. Discussed return precautions to [facility]/ED if either of their symptoms worsen. Plan for 20 day isolation given immunocompromised status with MS.       ISOLATION:    Reviewed the 3 criteria, that ALL must be met prior to discharge from home isolation with the patient? yes  a. No fever for at least 24 hours (without medication to reduce fevers)  b. Other symptoms have improved (eg. cough or shortness of breath have improved)  c. At least 10 days have passed since symptoms first appeared (20 days for high risk)      Reviewed home isolation instructions with patient yes  If not already done, send Mychart using smartphrase .[email or web address]s       WRAP UP/FOLLOW UP:     Disposition (Continue home isolation, ED referral, discharged from isolation): continue home isolation   Follow up visit/call: q24 hours  Provided Telephone Contact During Work Hours [phone] yes    ED/urgent care precautions discussed, including:   Shortness of breath or difficulty getting air    Chest pain   Confusion or disorientation   Dehydration or uncontrolled vomiting or diarrhea    Considerations:  Patient has difficulty breathing, sounds very sick or weak, has underlying respiratory or cardiac disease (e.g., asthma, COPD, heart failure)  If YES,   Please place ED referral and send the patient to be seen in nearest [facility] emergency department.    Ask the patient to request a mask as soon as they enter the building and remind them to wash their hands or use alcohol gel.  Please alert the location by calling that the patient will be arriving and give them a time estimate for their arrival.         Total duration of encounter spent on pre-visit planning, visit with patient and post-visit documentation and care, excluding separately reportable services/procedures: 10 minutes    Vinnie Level, MD  12/13/2019 3:07 PM     Patient discussed with  attending Dr. Carlye Grippe.

## 2019-12-13 NOTE — Patient Instructions (Signed)
Isolation information for Patients  (updated 03/22/2019)    Your health care provider will evaluate whether you can be cared for at home. If it is determined that you do not need hospitalization and can be isolated at home, you will be monitored by your health care provider.     You should follow the prevention steps below and even if your test is negative follow these guidelines including waiting to leave home until you are no longer contagious as per instructions at end of this document. Please follow any additional instructions for return to your workplace if you are an essential worker.    Contact our dedicated nurse line [phone] if you are noting worsening respiratory symptoms and need advice. This line is open 8 am -5 pm M-F days a week, on the weekend please call your physician or go Emergency Department.  If sudden and severe worsening in symptoms please call 911 and let them know you are being tested or are COVID-19 positive.        Stay home except to get medical care  [place] has a Health order for quarantine and it is a misdemeanor if you are not following:    https://www.[place].gov/content/dam/sdc/hhsa/programs/phs/Epidemiology/covid19/HealthOfficerOrder-Isolation.pdf  . You should do no activities outside your home, except for getting medical care. Do not go to work, school, or public areas. Do not use public transportation, ride-sharing, or taxis.  . If you have a medical appointment, call the healthcare provider and tell them that you have or may have COVID-19. This will help the healthcare provider's office take steps to keep other people from getting infected or exposed.  ; Have all essential items (eg groceries) delivered to your home and left at your doorstep. If you need assistance with this, call 211 to learn about available services.    Separate yourself from other people and animals in your home  . People: As much as possible, stay in a specific room and away from other people  in your home. If available, you should use a separate bathroom.  . Animals: Restrict contact with pets and other animals while you are sick with COVID-19, just like you would around other people. Avoid petting, snuggling, being kissed or licked, and sharing food with pets. When possible, have another member of your household care for your animals while you are sick. If you must care for your pet or be around animals while you are sick, wash your hands before and after you interact with them and wear a facemask.     Notify contacts of your illness if you test positive:  . Because people infected with COVID can spread the illness before they develop symptoms, you need to make a list of all people you've been in contact with from 48 hours prior to your first symptom through until you started home isolation.  . Contact the people on this list and inform them of your COVID19 diagnosis or potential diagnosis if you have symptoms but are not tested.   . Inform all of your contacts that they need to quarantine themselves in their homes for 14 days from the last point of contact.  They may leave their homes only to get medical care.    . If any of your contacts are an essential worker, they should contact their employer about their return to work policy.  If their employer does not have a policy, they may follow the CDC guidelines for exposed essential workers:  https://www.cdc.gov/coronavirus/2019-ncov/community/critical-workers/implementing-safety-practices.html     Wear   a facemask   . You should wear a facemask when you are around other people (e.g., sharing a room or vehicle) or pets and before you enter a health care provider's office. If you are unable to wear a facemask (for example, because it causes trouble breathing), then people who live with you should not stay in the same room with you, or they should wear a facemask if they enter your room.    Cover your coughs and sneezes  . Cover your mouth and nose with a  tissue when you cough or sneeze. Throw used tissues in a lined trash can; immediately wash your hands with soap and water for at least 20 seconds or clean your hands with an alcohol-based hand sanitizer that contains 60 to 95% alcohol, covering all surfaces of your hands and rubbing them together until they feel dry. Soap and water should be used preferentially if hands are visibly dirty.    Clean your hands often  . Wash your hands often with soap and water for at least 20 seconds or clean your hands with an alcohol-based hand sanitizer that contains 60 to 95% alcohol, covering all surfaces of your hands and rubbing them together until they feel dry. Soap and water should be used preferentially if hands are visibly dirty. Avoid touching your eyes, nose, and mouth with unwashed hands.    Avoid sharing personal household items  . You should not share dishes, drinking glasses, cups, eating utensils, towels, or bedding with other people or pets in your home. After using these items, they should be washed thoroughly with soap and water.    Clean all "high-touch" surfaces everyday  . High touch surfaces include counters, tabletops, doorknobs, bathroom fixtures, toilets, phones, keyboards, tablets, and bedside tables. Also, clean any surfaces that may have blood, stool, or bodily fluids on them. Use a household cleaning spray or wipe, according to the label instructions. Labels contain instructions for safe and effective use of the cleaning product including precautions you should take when applying the product, such as wearing gloves and making sure you have good ventilation during use of the product.    Monitor your symptoms  . Seek prompt medical attention if your illness is worsening (e.g., difficulty breathing). Before seeking care, call your health care provider and tell them that you have, or are being evaluated for, COVID-19. Put on a facemask if you have one before you enter the facility. These steps will help  the health care provider's office to keep other people in the office or waiting room from being infected or exposed.   Persons who are placed under active monitoring or facilitated self-monitoring should follow instructions provided by their local health department or occupational health professionals, as appropriate.  . If you have a medical emergency and need to call 911, notify the dispatch personnel that you have, or are being evaluated for COVID-19. If possible, put on a facemask before emergency medical services arrive.    Discontinuing home isolation  Patients with confirmed COVID-19 or with respiratory symptoms and a negative COVID-19 test should remain under home isolation precautions until the following three things have happened:    1. You have had no fever for at least 24 hours (that is one full day of no fever without the use medicine that reduces fevers)  AND  2. Other symptoms have improved (for example, when your cough or shortness of breath have improved)  AND  3. At least 10 days have passed since   your symptoms first appeared    Patients who have been exposed to someone with COVID-19 infection are required to quarantine.  Your quarantine can end when one of the following is true:  1. 14 days have passed since you were exposed and you have no symptoms. This is the approach that minimizes the risk that you could develop COVID-19 and that you infect another person (less than 1% chance of spread), and is the strongly recommended approach.   or  2. 10 days have passed since you were exposed and you have no symptoms.  There is about a 1% chance that you could develop COVID-19 and that you infect another person with this approach.   or  3. 7 days have passed since you were exposed, and you have tested negative with a COVID PCR test at least 5 days after your exposure.  There is about a 5% chance that you could develop COVID-19 and that you infect another person with this approach.      If you are an  essential worker, please contact your employer after you meet the above criteria to discuss their specific return to work policy. Please abide by their policies.    Please go to https://www.cdc.gov/coronavirus/2019-ncov/if-you-are-sick/index.html for additional information.

## 2019-12-14 ENCOUNTER — Telehealth (INDEPENDENT_AMBULATORY_CARE_PROVIDER_SITE_OTHER): Payer: Medicare Other | Admitting: Family Medicine

## 2019-12-14 ENCOUNTER — Encounter (INDEPENDENT_AMBULATORY_CARE_PROVIDER_SITE_OTHER): Payer: Self-pay | Admitting: Family Medicine

## 2019-12-14 DIAGNOSIS — R5383 Other fatigue: Secondary | ICD-10-CM

## 2019-12-14 DIAGNOSIS — G35 Multiple sclerosis: Secondary | ICD-10-CM

## 2019-12-14 DIAGNOSIS — U071 COVID-19: Secondary | ICD-10-CM

## 2019-12-14 NOTE — Progress Notes (Signed)
COVID-19 Positive Daily Check In    ---------------------(data below generated by Vinnie Level, MD)--------------------    Patient Verification & Telemedicine Consent:    I am proceeding with this evaluation at the direct request of the patient.  I have verified this is the correct patient and have obtained verbal consent and written consent from the patient/ surrogate to perform this voluntary telemedicine evaluation (including obtaining history, performing examination and reviewing data provided by the patient).   The patient/ surrogate has the right to refuse this evaluation.  I have explained risks (including potential loss of confidentiality), benefits, alternatives, and the potential need for subsequent face to face care. Patient/ surrogate understands that there is a risk of medical inaccuracies given that our recommendations will be made based on reported data (and we must therefore assume this information is accurate).  Knowing that there is a risk that this information is not reported accurately, and that the telemedicine video, audio, or data feed may be incomplete, the patient agrees to proceed with evaluation and holds [place] harmless knowing these risks. In this evaluation, we will be providing recommendations only. The patient/ surrogate has been notified that other healthcare professionals (including students, residents and Met[employer]) may be involved in this audio-video evaluation.   All laws concerning confidentiality and patient access to medical records and copies of medical records apply to telemedicine.  The patient/ surrogate has received the [place] Notice of Privacy Practices.  I have reviewed this above verification and consent paragraph with the patient/ surrogate.  If the patient is not capacitated to understand the above, and no surrogate is available, since this is not an emergency evaluation, the visit will be rescheduled until such time that the patient can consent, or  the surrogate is available to consent.    Demographics:   Medical Record #: [id number]   Date: December 14, 2019   Patient Name: Debra Bolton   DOB: 16-Feb-1961  Age: 59 year old  Sex: female  Location: Home address on file    Evaluator(s):   Denise Weisenburger was evaluated by me today.    Clinic Location: [facility]  [address]  [place] [address]    COVID SUMMARY:    YES completed COVID vaccination   Exposure History:partnershe lives with is positive, they tested positive 8/16  Date Symptoms Began (day zero):12/04/2019  Date of Positive Test:12/07/2019  People living with patient and COVID status:partner also has COVID19  Potential last day of Home Isolation date: 9/6 (extended to 20 days due to immunocompromised status with MS)  - s/p monoclonal antibody infusion yesterday, 8/25  Recommended Monitoring Frequency: every few days   Updated blue sticky note yes    Lab Results   Component Value Date    CO19 Not Detected 09/03/2019       SYMPTOM REASSESSMENT:    Current Symptoms: fatigue, cough; better appetite today, "turned the corner"     Is the patient short of breath, having difficulty breathing, or experiencing chest pain?  no   Is the patient coughing? yes   What have the measured temperature at home in the last 24 hours? No fevers   Does the patient have fatigue or sound sick or weak? no   Is the patient answering questions appropriately without evidence of confusion? no   Does the patient have any vomiting or diarrhea? no   Can you tolerate liquids? yes   Does  the patient need ED referral?  no    ASSESSMENT/PLAN:  Arihana was seen today for covid-19 syndrome.    Diagnoses and all orders for this visit:    COVID-19 virus infection  S/p monoclonal Ab infusion, symptoms continuing to improve. Still has some fatigue and cough, but denies SOB, fevers, CP, etc. Plan for 20 day isolation given immunocompromised status with MS. Will spread up f/u  to mid next week unless indicated earlier by patient, return precautions discussed.      ISOLATION:    Reviewed the 3 criteria, that ALL must be met prior to discharge from home isolation with the patient? yes  a. No fever for at least 24 hours (without medication to reduce fevers)  b. Other symptoms have improved (eg. cough or shortness of breath have improved)  c. At least 10 days have passed since symptoms first appeared (20 days for high risk)      Reviewed home isolation instructions with patient yes  If not already done, send Mychart using smartphrase .[email or web address]       WRAP UP/FOLLOW UP:     Disposition (Continue home isolation, ED referral, discharged from isolation): continue home isolation   Follow up visit/call: every few days (f/u mid next week)  Provided Telephone Contact During Work Hours [phone] yes    ED/urgent care precautions discussed, including:   Shortness of breath or difficulty getting air    Chest pain   Confusion or disorientation   Dehydration or uncontrolled vomiting or diarrhea    Considerations:  Patient has difficulty breathing, sounds very sick or weak, has underlying respiratory or cardiac disease (e.g., asthma, COPD, heart failure)  If YES,   Please place ED referral and send the patient to be seen in nearest [facility] emergency department.    Ask the patient to request a mask as soon as they enter the building and remind them to wash their hands or use alcohol gel.  Please alert the location by calling that the patient will be arriving and give them a time estimate for their arrival.     Total duration of encounter spent on pre-visit planning, visit with patient and post-visit documentation and care, excluding separately reportable services/procedures: 20 minutes    Vinnie Level, MD  12/14/2019 1:44 PM     Patient discussed with attending Dr. Corbin Ade.

## 2019-12-14 NOTE — Patient Instructions (Signed)
Isolation information for Patients  (updated 03/22/2019)    Your health care provider will evaluate whether you can be cared for at home. If it is determined that you do not need hospitalization and can be isolated at home, you will be monitored by your health care provider.     You should follow the prevention steps below and even if your test is negative follow these guidelines including waiting to leave home until you are no longer contagious as per instructions at end of this document. Please follow any additional instructions for return to your workplace if you are an essential worker.    Contact our dedicated nurse line [phone] if you are noting worsening respiratory symptoms and need advice. This line is open 8 am -5 pm M-F days a week, on the weekend please call your physician or go Emergency Department.  If sudden and severe worsening in symptoms please call 911 and let them know you are being tested or are COVID-19 positive.        Stay home except to get medical care  [employer] has a Health order for quarantine and it is a misdemeanor if you are not following:    https://www.[place].gov/content/dam/sdc/hhsa/programs/phs/Epidemiology/covid19/HealthOfficerOrder-Isolation.pdf  . You should do no activities outside your home, except for getting medical care. Do not go to work, school, or public areas. Do not use public transportation, ride-sharing, or taxis.  . If you have a medical appointment, call the healthcare provider and tell them that you have or may have COVID-19. This will help the healthcare provider's office take steps to keep other people from getting infected or exposed.  ; Have all essential items (eg groceries) delivered to your home and left at your doorstep. If you need assistance with this, call 211 to learn about available services.    Separate yourself from other people and animals in your home  . People: As much as possible, stay in a specific room and away from other people  in your home. If available, you should use a separate bathroom.  . Animals: Restrict contact with pets and other animals while you are sick with COVID-19, just like you would around other people. Avoid petting, snuggling, being kissed or licked, and sharing food with pets. When possible, have another member of your household care for your animals while you are sick. If you must care for your pet or be around animals while you are sick, wash your hands before and after you interact with them and wear a facemask.     Notify contacts of your illness if you test positive:  . Because people infected with COVID can spread the illness before they develop symptoms, you need to make a list of all people you've been in contact with from 48 hours prior to your first symptom through until you started home isolation.  . Contact the people on this list and inform them of your COVID19 diagnosis or potential diagnosis if you have symptoms but are not tested.   . Inform all of your contacts that they need to quarantine themselves in their homes for 14 days from the last point of contact.  They may leave their homes only to get medical care.    . If any of your contacts are an essential worker, they should contact their employer about their return to work policy.  If their employer does not have a policy, they may follow the CDC guidelines for exposed essential workers:  https://www.cdc.gov/coronavirus/2019-ncov/community/critical-workers/implementing-safety-practices.html     Wear   a facemask   . You should wear a facemask when you are around other people (e.g., sharing a room or vehicle) or pets and before you enter a health care provider's office. If you are unable to wear a facemask (for example, because it causes trouble breathing), then people who live with you should not stay in the same room with you, or they should wear a facemask if they enter your room.    Cover your coughs and sneezes  . Cover your mouth and nose with a  tissue when you cough or sneeze. Throw used tissues in a lined trash can; immediately wash your hands with soap and water for at least 20 seconds or clean your hands with an alcohol-based hand sanitizer that contains 60 to 95% alcohol, covering all surfaces of your hands and rubbing them together until they feel dry. Soap and water should be used preferentially if hands are visibly dirty.    Clean your hands often  . Wash your hands often with soap and water for at least 20 seconds or clean your hands with an alcohol-based hand sanitizer that contains 60 to 95% alcohol, covering all surfaces of your hands and rubbing them together until they feel dry. Soap and water should be used preferentially if hands are visibly dirty. Avoid touching your eyes, nose, and mouth with unwashed hands.    Avoid sharing personal household items  . You should not share dishes, drinking glasses, cups, eating utensils, towels, or bedding with other people or pets in your home. After using these items, they should be washed thoroughly with soap and water.    Clean all "high-touch" surfaces everyday  . High touch surfaces include counters, tabletops, doorknobs, bathroom fixtures, toilets, phones, keyboards, tablets, and bedside tables. Also, clean any surfaces that may have blood, stool, or bodily fluids on them. Use a household cleaning spray or wipe, according to the label instructions. Labels contain instructions for safe and effective use of the cleaning product including precautions you should take when applying the product, such as wearing gloves and making sure you have good ventilation during use of the product.    Monitor your symptoms  . Seek prompt medical attention if your illness is worsening (e.g., difficulty breathing). Before seeking care, call your health care provider and tell them that you have, or are being evaluated for, COVID-19. Put on a facemask if you have one before you enter the facility. These steps will help  the health care provider's office to keep other people in the office or waiting room from being infected or exposed.   Persons who are placed under active monitoring or facilitated self-monitoring should follow instructions provided by their local health department or occupational health professionals, as appropriate.  . If you have a medical emergency and need to call 911, notify the dispatch personnel that you have, or are being evaluated for COVID-19. If possible, put on a facemask before emergency medical services arrive.    Discontinuing home isolation  Patients with confirmed COVID-19 or with respiratory symptoms and a negative COVID-19 test should remain under home isolation precautions until the following three things have happened:    1. You have had no fever for at least 24 hours (that is one full day of no fever without the use medicine that reduces fevers)  AND  2. Other symptoms have improved (for example, when your cough or shortness of breath have improved)  AND  3. At least 10 days have passed since   your symptoms first appeared    Patients who have been exposed to someone with COVID-19 infection are required to quarantine.  Your quarantine can end when one of the following is true:  1. 14 days have passed since you were exposed and you have no symptoms. This is the approach that minimizes the risk that you could develop COVID-19 and that you infect another person (less than 1% chance of spread), and is the strongly recommended approach.   or  2. 10 days have passed since you were exposed and you have no symptoms.  There is about a 1% chance that you could develop COVID-19 and that you infect another person with this approach.   or  3. 7 days have passed since you were exposed, and you have tested negative with a COVID PCR test at least 5 days after your exposure.  There is about a 5% chance that you could develop COVID-19 and that you infect another person with this approach.      If you are an  essential worker, please contact your employer after you meet the above criteria to discuss their specific return to work policy. Please abide by their policies.    Please go to https://www.cdc.gov/coronavirus/2019-ncov/if-you-are-sick/index.html for additional information.

## 2019-12-14 NOTE — Progress Notes (Signed)
Attending Note:                                                                   Subjective:  I discussed the case with the resident at the time of the clinical session.    I reviewed the entire history.  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 currently having cough, fatigue,)            - h/o MS  - got MAB infusion 2 days ago, sx improving  - no resp sx or fevers currently      Objective:  I reviewed the resident's examination findings.     Assessment and plan reviewed, discussed and finalized with the resident.   I agree with the resident's plan as documented.    - plan for 20 day quarantine  - check in by phone 5-6 days    See the resident physician's note for further details.    Donell Sievert MD, MPH  Family Medicine

## 2019-12-17 ENCOUNTER — Ambulatory Visit (INDEPENDENT_AMBULATORY_CARE_PROVIDER_SITE_OTHER): Payer: Medicare HMO | Admitting: Legal Medicine

## 2019-12-17 ENCOUNTER — Encounter: Payer: Self-pay | Admitting: Legal Medicine

## 2019-12-17 ENCOUNTER — Other Ambulatory Visit: Payer: Self-pay

## 2019-12-17 VITALS — BP 120/66 | HR 70 | Temp 97.4°F | Resp 17 | Ht 64.5 in | Wt 99.4 lb

## 2019-12-17 DIAGNOSIS — J449 Chronic obstructive pulmonary disease, unspecified: Secondary | ICD-10-CM

## 2019-12-17 DIAGNOSIS — I10 Essential (primary) hypertension: Secondary | ICD-10-CM | POA: Diagnosis not present

## 2019-12-17 DIAGNOSIS — R5382 Chronic fatigue, unspecified: Secondary | ICD-10-CM

## 2019-12-17 DIAGNOSIS — F17219 Nicotine dependence, cigarettes, with unspecified nicotine-induced disorders: Secondary | ICD-10-CM

## 2019-12-17 DIAGNOSIS — F321 Major depressive disorder, single episode, moderate: Secondary | ICD-10-CM

## 2019-12-17 DIAGNOSIS — E782 Mixed hyperlipidemia: Secondary | ICD-10-CM | POA: Diagnosis not present

## 2019-12-17 DIAGNOSIS — Z681 Body mass index (BMI) 19 or less, adult: Secondary | ICD-10-CM
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
## 2019-12-18 LAB — LIPID PANEL
Chol/HDL Ratio: 2.5 ratio (ref 0.0–4.4)
Cholesterol, Total: 130 mg/dL (ref 100–199)
HDL: 52 mg/dL (ref >39)
LDL Chol Calc (NIH): 58 mg/dL (ref 0–99)
Triglycerides: 107 mg/dL (ref 0–149)
VLDL Cholesterol Cal: 20 mg/dL (ref 5–40)

## 2019-12-18 LAB — COMPREHENSIVE METABOLIC PANEL
ALT: 9 IU/L (ref 0–32)
AST: 15 IU/L (ref 0–40)
Albumin/Globulin Ratio: 2.1 (ref 1.2–2.2)
Albumin: 4.4 g/dL (ref 3.8–4.8)
Alkaline Phosphatase: 97 IU/L (ref 48–121)
BUN/Creatinine Ratio: 14 (ref 12–28)
BUN: 9 mg/dL (ref 8–27)
Bilirubin Total: 0.4 mg/dL (ref 0.0–1.2)
CO2: 25 mmol/L (ref 20–29)
Calcium: 9.4 mg/dL (ref 8.7–10.3)
Chloride: 105 mmol/L (ref 96–106)
Creatinine, Ser: 0.63 mg/dL (ref 0.57–1.00)
GFR calc Af Amer: 112 mL/min/{1.73_m2} (ref >59)
GFR calc non Af Amer: 97 mL/min/{1.73_m2} (ref >59)
Globulin, Total: 2.1 g/dL (ref 1.5–4.5)
Glucose: 81 mg/dL (ref 65–99)
Potassium: 4.1 mmol/L (ref 3.5–5.2)
Sodium: 142 mmol/L (ref 134–144)
Total Protein: 6.5 g/dL (ref 6.0–8.5)

## 2019-12-18 LAB — CBC WITH DIFFERENTIAL/PLATELET
Basophils Absolute: 0 10*3/uL (ref 0.0–0.2)
Basos: 1 %
EOS (ABSOLUTE): 0.1 10*3/uL (ref 0.0–0.4)
Eos: 2 %
Hematocrit: 40.3 % (ref 34.0–46.6)
Hemoglobin: 13.8 g/dL (ref 11.1–15.9)
Immature Grans (Abs): 0 10*3/uL (ref 0.0–0.1)
Immature Granulocytes: 0 %
Lymphocytes Absolute: 2 10*3/uL (ref 0.7–3.1)
Lymphs: 27 %
MCH: 32.8 pg (ref 26.6–33.0)
MCHC: 34.2 g/dL (ref 31.5–35.7)
MCV: 96 fL (ref 79–97)
Monocytes Absolute: 0.4 10*3/uL (ref 0.1–0.9)
Monocytes: 6 %
Neutrophils Absolute: 4.9 10*3/uL (ref 1.4–7.0)
Neutrophils: 64 %
Platelets: 204 10*3/uL (ref 150–450)
RBC: 4.21 x10E6/uL (ref 3.77–5.28)
RDW: 12.2 % (ref 11.7–15.4)
WBC: 7.6 10*3/uL (ref 3.4–10.8)

## 2019-12-18 LAB — CARDIOVASCULAR RISK ASSESSMENT

## 2019-12-18 LAB — TSH: TSH: 0.688 u[IU]/mL (ref 0.450–4.500)

## 2019-12-18 LAB — VITAMIN B12: Vitamin B-12: 506 pg/mL (ref 232–1245)

## 2019-12-18 NOTE — Progress Notes (Signed)
B12 506 normal level, Cholesterol normal, Tsh 0.688, CBC normal, kidney and liver tests normal,  lp

## 2019-12-19 ENCOUNTER — Telehealth (INDEPENDENT_AMBULATORY_CARE_PROVIDER_SITE_OTHER): Payer: Medicare Other | Admitting: Family Practice

## 2019-12-19 DIAGNOSIS — U071 COVID-19: Secondary | ICD-10-CM

## 2019-12-19 DIAGNOSIS — D8989 Other specified disorders involving the immune mechanism, not elsewhere classified: Secondary | ICD-10-CM

## 2019-12-19 DIAGNOSIS — G35 Multiple sclerosis: Secondary | ICD-10-CM

## 2019-12-19 NOTE — Progress Notes (Signed)
Due to COVID-19 pandemic and a federally declared state of public health emergency, this service is being conducted virtually.  Patient has multiple co-morbidities that pose substantial risk if exposed to COVID-19.     Patient Verification & Telemedicine Consent:    I am proceeding with this evaluation at the direct request of the patient.  I have verified this is the correct patient and have obtained verbal consent and written consent from the patient/ surrogate to perform this voluntary telemedicine evaluation (including obtaining history, performing examination and reviewing data provided by the patient).   The patient/ surrogate has the right to refuse this evaluation.  I have explained risks (including potential loss of confidentiality), benefits, alternatives, and the potential need for subsequent face to face care. Patient/ surrogate understands that there is a risk of medical inaccuracies given that our recommendations will be made based on reported data (and we must therefore assume this information is accurate).  Knowing that there is a risk that this information is not reported accurately, and that the telemedicine video, audio, or data feed may be incomplete, the patient agrees to proceed with evaluation and holds [place] harmless knowing these risks. In this evaluation, we will be providing recommendations only.  The ultimate decision to follow, or not follow, these recommendations will be left to the bedside treating/ requesting practitioner.  The patient/ surrogate has been notified that other healthcare professionals (including students, residents and Metallurgist) may be involved in this audio-video evaluation.   All laws concerning confidentiality and patient access to medical records and copies of medical records apply to telemedicine.  The patient/ surrogate has received the [facility] Notice of Privacy Practices.  I have reviewed this above verification and consent paragraph with the patient/  surrogate.  If the patient is not capacitated to understand the above, and no surrogate is available, since this is not an emergency evaluation, the visit will be rescheduled until such time that the patient can consent, or the surrogate is available to consent.      FAMILY MEDICINE CLINIC NOTE    SUBJECTIVE:  Debra Bolton is a 59 year old female with a h/o MS and COVID infection who presents to clinic for:  COVID-19 Check-In     Overall improving  Mostly very fatigued  Feels some throat swelling/lymph nodes, no problems eating/drinking and able to hydrate without issues  No fevers, SOB, CP  Received monoclonal antibody through outside provider and instructed not to get COVID booster for the next 90 days  ID consult recommended 20 day isolation    ROS:   General - no fevers  Lungs - no shortness of breath/respiratory distress  GI - no nausea/vomiting  Heart - no chest pain    HISTORY:  Patient Active Problem List   Diagnosis   . Multiple sclerosis (CMS-HCC)   . IgG4 related disease (CMS-HCC)   . Uveitis of right eye   . 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 Mood disorder (CMS-HCC)   . PTSD (post-traumatic stress disorder)   . Adequate exercise, at or above goal   . Rotator cuff tear   . Dental caries, unspecified   . Deposits (accretions) on teeth   . Rectal bleeding     Current Outpatient Medications on File Prior to Visit   Medication Sig Dispense Refill   . albuterol (VENTOLIN HFA) 108 (90 Base) MCG/ACT inhaler Inhale 2 puffs by mouth every 6 hours as needed for Wheezing. 3 Inhaler 3   .  Amantadine HCl 100 MG tablet Take 100 mg by mouth 2 times daily. Takes PRN 60 tablet 0   . baclofen (LIORESAL) 10 MG tablet Take 1 tablet (10 mg) by mouth 3 times daily. (Patient taking differently: Take 10 mg by mouth nightly. ) 90 tablet 11   . benzonatate (TESSALON) 100 MG capsule Take 1 capsule (100 mg) by mouth every 8 hours as needed for Cough. 30 capsule 2   . diazepam (VALIUM) 5 MG tablet Take 1  tablet (5 mg) by mouth daily as needed for Anxiety. 7 tablet 0   . DULoxetine (CYMBALTA) 60 MG CR capsule Take 2 capsules (120 mg) by mouth daily. 180 capsule 3   . gabapentin (NEURONTIN) 300 MG capsule Take 2 capsules (600 mg) by mouth nightly. 60 capsule 5   . meclizine (ANTIVERT) 25 MG tablet Take 1 tablet (25 mg) by mouth every 8 hours as needed for Dizziness. 30 tablet 0   . metoprolol succinate (TOPROL XL) 25 MG XL tablet Take 1 tablet (25 mg) by mouth daily. 30 tablet 3   . nortriptyline (PAMELOR) 25 MG capsule Take 2 capsules (50 mg) by mouth nightly. 180 capsule 3   . prednisoLONE acetate (PRED FORTE) 1 % ophthalmic suspension Place 1 drop into right eye 4 times daily. For two weeks then tid for one week then bid for one week then daily for one week then stop. 1 bottle 2   . thyroid (ARMOUR THYROID) 15 MG tablet Take 1 tablet (15 mg) by mouth every other day. 45 tablet 2   . thyroid (ARMOUR THYROID) 60 MG tablet Take 1 tablet (60 mg) by mouth daily. 90 tablet 2   . tiZANidine (ZANAFLEX) 4 MG tablet Take 1.5 tablets (6 mg) by mouth at bedtime. 135 tablet 3     No current facility-administered medications on file prior to visit.     Allergies   Allergen Reactions   . Penicillins Rash   . Modafinil Diarrhea and Nausea and Vomiting     Family History   Problem Relation Name Age of Onset   . Other Mother Zara Chess    . Thyroid Mother Zara Chess    . Heart Disease Mother Zara Chess         heart failure   . Cancer Father don mc clain         lung   . Cancer Brother tom mcclain    . Heart Disease Brother tom mcclain         heart attack   . Cancer Sister Zara Chess         breast   . Diabetes Sister suzanne mcclain    . Thyroid Sister suzanne mcclain         car accident   . Stroke Brother [place]    . Cancer Brother Jeremy Johann         prostate cancer   . Cancer Brother john mc clain         copd lungs     Social History     Socioeconomic History   . Marital status: Divorced     Spouse name: Not on  file   . Number of children: Not on file   . Years of education: Not on file   . Highest education level: Not on file   Occupational History   . Not on file   Tobacco Use   . Smoking status: Former Smoker     Packs/day:  1.00     Years: 15.00     Pack years: 15.00     Types: Cigarettes     Quit date: 01/27/2016     Years since quitting: 3.8   . Smokeless tobacco: Never Used   Substance and Sexual Activity   . Alcohol use: Yes     Alcohol/week: 2.0 standard drinks     Types: 2 Glasses of wine per week   . Drug use: Never   . Sexual activity: Yes     Partners: Male     Birth control/protection: Rhythm   Other Topics Concern   . Military Service No   . Blood Transfusions No   . Caffeine Concern No   . Occupational Exposure No   . Hobby Hazards No   . Sleep Concern No   . Stress Concern No   . Weight Concern No   . Special Diet No   . Back Care No   . Exercise Not Asked   . Bike Helmet Not Asked   . Seat Belt Not Asked   . Self-Exams Not Asked   . Exercises Regularly Yes   . Bike Helmet Use Yes   . Seat Belt Use Yes   . Performs Self-Exams Yes   Social History Narrative    Moved from [place] is great, high fiber, she is very conscientious of effect of diet on health.    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    Hasn't had a cigarette in 8 months.    Has smoked 26 years, less than a pack a day.    Currently drinks 2-4 drinks a day on the weekends, total 4-8 per week.    Sexually active with one female partner.    Currently not working, has been on disability for most of the time from 1992 to now.     Social Determinants of Health     Financial Resource Strain:    . Difficulty of Paying Living Expenses:    Food Insecurity:    . Worried About Charity fundraiser in the Last Year:    . Arboriculturist in the Last Year:    Transportation Needs:    . Film/video editor (Medical):    Marland Kitchen Lack of Transportation (Non-Medical):    Physical Activity:    . Days of Exercise per Week:    . Minutes of Exercise per Session:    Stress:    .  Feeling of Stress :    Social Connections:    . Frequency of Communication with Friends and Family:    . Frequency of Social Gatherings with Friends and Family:    . Attends Religious Services:    . Active Member of Clubs or Organizations:    . Attends Archivist Meetings:    Marland Kitchen Marital Status:    Intimate Partner Violence:    . Fear of Current or Ex-Partner:    . Emotionally Abused:    Marland Kitchen Physically Abused:    . Sexually Abused:        OBJECTIVE:  Limited on phone - per patient preference unable to connect to video  There were no vitals taken for this visit.    There is no height or weight on file to calculate BMI.  No acute distress, able to complete full sentences.    ASSESMENT AND PLAN:   Debra Bolton is a 59 year old female with a h/o MS, IgG5 related  disease who presents with COVID infection.    Rionna was seen today for covid-19 check-in.    Diagnoses and all orders for this visit:    COVID-19 virus infection  Multiple sclerosis (CMS-HCC)  IgG4 related disease (CMS-HCC)  - COVID check in, overall improving s/p monoclonal antibody provided at home by outside health system  - ID consultant at outside facility recommended 20-day isolation  - Symptoms started 8/17, tested positive 8/20  - Last day of isolation 9/6  - Continue supportive care, ED precautions    F/U: PRN     Patient Instruction:   See Patient Education section.     Barriers to Learning assessed: none. Patient verbalizes understanding of teaching and instructions.      Medication Management:  Medications reviewed with patient and medication list reconciled.  Over the counter medications, herbal therapies and supplements reviewed.  Patient's understanding and response to medications assessed.    Barriers to medications assessed and addressed.  Risks, benefits, alternatives to medications reviewed.    Blain Pais, MD (she/hers)  Primary Care Sports Medicine Fellow

## 2020-01-15 DIAGNOSIS — M48061 Spinal stenosis, lumbar region without neurogenic claudication: Secondary | ICD-10-CM | POA: Diagnosis not present

## 2020-01-15 DIAGNOSIS — M5136 Other intervertebral disc degeneration, lumbar region: Secondary | ICD-10-CM | POA: Diagnosis not present

## 2020-01-15 DIAGNOSIS — Z1389 Encounter for screening for other disorder: Secondary | ICD-10-CM | POA: Diagnosis not present

## 2020-01-15 DIAGNOSIS — M47816 Spondylosis without myelopathy or radiculopathy, lumbar region: Secondary | ICD-10-CM | POA: Diagnosis not present

## 2020-01-15 DIAGNOSIS — G894 Chronic pain syndrome: Secondary | ICD-10-CM | POA: Diagnosis not present

## 2020-01-15 DIAGNOSIS — M461 Sacroiliitis, not elsewhere classified: Secondary | ICD-10-CM | POA: Diagnosis not present

## 2020-01-17 ENCOUNTER — Other Ambulatory Visit: Payer: Self-pay | Admitting: Legal Medicine

## 2020-01-17 NOTE — Progress Notes (Signed)
Attending Note:                                                                   Subjective:  I discussed the case with the resident at the time of the clinical session.    I reviewed the entire history.  History of present illness (HPI): COVID-19 Re-check / Follow Up (cough, mild body aches )      Objective:  I reviewed the resident's examination findings.  The test results were reviewed with the resident.    Assessment and plan reviewed, discussed and finalized with the resident.   I agree with the resident's plan as documented.    See the resident physician's note for further details.    Ned Clines Carlye Grippe, MD

## 2020-01-19 ENCOUNTER — Other Ambulatory Visit: Payer: Self-pay | Admitting: Legal Medicine

## 2020-01-19 DIAGNOSIS — E782 Mixed hyperlipidemia: Secondary | ICD-10-CM

## 2020-01-19 MED ORDER — ROSUVASTATIN CALCIUM 5 MG PO TABS: 5.0000 mg | ORAL_TABLET | Freq: Every day | ORAL | 2 refills | Status: DC

## 2020-01-21 ENCOUNTER — Other Ambulatory Visit: Payer: Self-pay | Admitting: Legal Medicine

## 2020-01-21 DIAGNOSIS — E782 Mixed hyperlipidemia: Secondary | ICD-10-CM

## 2020-01-21 MED ORDER — ROSUVASTATIN CALCIUM 5 MG PO TABS: 5.0000 mg | ORAL_TABLET | Freq: Every day | ORAL | 2 refills | Status: DC

## 2020-01-30 ENCOUNTER — Other Ambulatory Visit: Payer: Self-pay | Admitting: Legal Medicine

## 2020-02-12 DIAGNOSIS — M48061 Spinal stenosis, lumbar region without neurogenic claudication: Secondary | ICD-10-CM | POA: Diagnosis not present

## 2020-02-12 DIAGNOSIS — G894 Chronic pain syndrome: Secondary | ICD-10-CM | POA: Diagnosis not present

## 2020-02-12 DIAGNOSIS — M47816 Spondylosis without myelopathy or radiculopathy, lumbar region: Secondary | ICD-10-CM | POA: Diagnosis not present

## 2020-02-12 DIAGNOSIS — M5136 Other intervertebral disc degeneration, lumbar region: Secondary | ICD-10-CM | POA: Diagnosis not present

## 2020-02-12 DIAGNOSIS — Z1389 Encounter for screening for other disorder: Secondary | ICD-10-CM | POA: Diagnosis not present

## 2020-02-12 DIAGNOSIS — M461 Sacroiliitis, not elsewhere classified: Secondary | ICD-10-CM | POA: Diagnosis not present

## 2020-02-18 DIAGNOSIS — F17200 Nicotine dependence, unspecified, uncomplicated: Secondary | ICD-10-CM | POA: Diagnosis not present

## 2020-02-18 DIAGNOSIS — Z951 Presence of aortocoronary bypass graft: Secondary | ICD-10-CM | POA: Diagnosis not present

## 2020-02-18 DIAGNOSIS — E785 Hyperlipidemia, unspecified: Secondary | ICD-10-CM | POA: Diagnosis not present

## 2020-02-18 DIAGNOSIS — I6523 Occlusion and stenosis of bilateral carotid arteries: Secondary | ICD-10-CM | POA: Diagnosis not present

## 2020-02-18 DIAGNOSIS — I10 Essential (primary) hypertension: Secondary | ICD-10-CM | POA: Diagnosis not present

## 2020-02-18 DIAGNOSIS — I2510 Atherosclerotic heart disease of native coronary artery without angina pectoris: Secondary | ICD-10-CM | POA: Diagnosis not present

## 2020-02-22 ENCOUNTER — Other Ambulatory Visit: Payer: Self-pay | Admitting: Legal Medicine

## 2020-02-25 ENCOUNTER — Encounter (INDEPENDENT_AMBULATORY_CARE_PROVIDER_SITE_OTHER): Payer: Medicare Other | Admitting: Interventional Cardiology

## 2020-03-11 DIAGNOSIS — M47816 Spondylosis without myelopathy or radiculopathy, lumbar region: Secondary | ICD-10-CM | POA: Diagnosis not present

## 2020-03-11 DIAGNOSIS — M48061 Spinal stenosis, lumbar region without neurogenic claudication: Secondary | ICD-10-CM | POA: Diagnosis not present

## 2020-03-11 DIAGNOSIS — G894 Chronic pain syndrome: Secondary | ICD-10-CM | POA: Diagnosis not present

## 2020-03-11 DIAGNOSIS — M5136 Other intervertebral disc degeneration, lumbar region: Secondary | ICD-10-CM | POA: Diagnosis not present

## 2020-03-11 DIAGNOSIS — Z1389 Encounter for screening for other disorder: Secondary | ICD-10-CM | POA: Diagnosis not present

## 2020-03-11 DIAGNOSIS — M461 Sacroiliitis, not elsewhere classified: Secondary | ICD-10-CM | POA: Diagnosis not present

## 2020-03-12 ENCOUNTER — Telehealth (INDEPENDENT_AMBULATORY_CARE_PROVIDER_SITE_OTHER): Payer: Self-pay | Admitting: Ophthalmology

## 2020-03-12 ENCOUNTER — Encounter (INDEPENDENT_AMBULATORY_CARE_PROVIDER_SITE_OTHER): Payer: Self-pay | Admitting: Internal Medicine

## 2020-03-12 ENCOUNTER — Encounter (INDEPENDENT_AMBULATORY_CARE_PROVIDER_SITE_OTHER): Payer: Self-pay | Admitting: Family Practice

## 2020-03-12 ENCOUNTER — Encounter (INDEPENDENT_AMBULATORY_CARE_PROVIDER_SITE_OTHER): Payer: Self-pay | Admitting: Ophthalmology

## 2020-03-12 NOTE — Telephone Encounter (Signed)
Patient called states she moved to [facility]  / [place] area. She has Uveitis, is there anyone Dr. Frederico Hamman recommends in her area.     She is also having a flareup. Please call with instructions, she can't drive down here due to transportation issues. She may not be able to drive in on Monday. She is scheduled 03/17/20 at 10:15am.

## 2020-03-12 NOTE — Telephone Encounter (Signed)
Spoke to pt. Since August when pt had covid, feels OD has been gradually blurring. Denies redness, pain, or photophobia OU. Pt moved to [facility] and unable to come today or Monday to [place]. Pt wants to see Dr. Frederico Hamman only for her follow up. Since she is unable to come to clinic Monday I gave pt [facility] # for Friday clinic. If symptoms worsen I asked pt to see me or other urgent care optom at [facility].

## 2020-03-12 NOTE — Progress Notes (Signed)
I spoke to patient regarding new symptoms and recommended Uveitis specialists at [facility] (closest to her) and [place] if she is unable to come here next week, patient will contact me again if symptoms worsen or if she isn't able to be seen expeditiously at [facility] or [place].

## 2020-03-14 ENCOUNTER — Other Ambulatory Visit: Payer: Self-pay

## 2020-03-16 ENCOUNTER — Encounter (HOSPITAL_BASED_OUTPATIENT_CLINIC_OR_DEPARTMENT_OTHER): Payer: Self-pay | Admitting: Student in an Organized Health Care Education/Training Program

## 2020-03-16 DIAGNOSIS — D72810 Lymphocytopenia: Secondary | ICD-10-CM

## 2020-03-16 DIAGNOSIS — D8989 Other specified disorders involving the immune mechanism, not elsewhere classified: Secondary | ICD-10-CM

## 2020-03-17 ENCOUNTER — Ambulatory Visit (INDEPENDENT_AMBULATORY_CARE_PROVIDER_SITE_OTHER): Payer: Medicare Other | Admitting: Ophthalmology

## 2020-03-17 ENCOUNTER — Encounter (INDEPENDENT_AMBULATORY_CARE_PROVIDER_SITE_OTHER): Payer: Self-pay | Admitting: Ophthalmology

## 2020-03-17 DIAGNOSIS — H209 Unspecified iridocyclitis: Secondary | ICD-10-CM

## 2020-03-17 MED ORDER — CYCLOPENTOLATE HCL 1 % OP SOLN
1.0000 [drp] | Freq: Three times a day (TID) | OPHTHALMIC | 1 refills | Status: DC
Start: 2020-03-17 — End: 2020-09-23

## 2020-03-17 MED ORDER — RITUXIMAB IV: INTRAVENOUS | Status: AC

## 2020-03-17 NOTE — Assessment & Plan Note (Addendum)
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 Improved appearance today after having tapered off topical steroids since last visit, none for past few weeks with decreased symptoms. Not clearly active. Likely improved off steroids due to Rituxan, last was 6 months ago per patient and overdue now, recommend repeat per Neurology. High risk of vision loss from uncontrolled uveitis, agree with systemic treatment. Uveitis precautions emphasized. Recommendations discussed with outside providers.    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 High risk cataract surgery with uncertain visual potential although appears to have progressed and is contributing to worsened vision OD, symptomatic, patient interested in surgery although will be traveling soon, patient will contact [place] to schedule. OCT mac w/o edema ou previously.    Had COVID vaccine 11/2019 and hasn't felt well since (had Regeneron). VA improved with dilation, will do try of cyclopentolate.

## 2020-03-18 ENCOUNTER — Encounter (INDEPENDENT_AMBULATORY_CARE_PROVIDER_SITE_OTHER): Payer: Self-pay | Admitting: Ophthalmology

## 2020-03-24 NOTE — Telephone Encounter (Signed)
Note that Debra Bolton is She is s/p Rituximab 500 mg X 2 doses (07/01/17 & 07/19/17), 1000 mg X 2 (01/02/18 & 01/16/18), 1000 mg X 1 (09/19/2018, 08/10/19)      Prior plan 3/16 was as follows:  Debra Bolton is seen by Dr. Rosendo Gros and Dr. Candiss Norse for     1. Multiple Sclerosis, relapsing form   2. Anterior uveitis   3. IgG related lymphadenopathy, chronic sialoadenitis     She is s/p Rituximab 500 mg X 2 doses (07/01/17 & 07/19/17), 1000 mg X 2 (01/02/18 & 01/16/18), 1000 mg X 1 (09/19/2018)     Labs recently completed show the following:  CD27+ CM B cells: 4 cells/uL  CD19: 51 cells/uL    Agreed upon plan is to await SARS-CoV2 vaccination and higher CD27 B cells prior to next dose of rituximab.    Spoke to Debra Bolton    She is largely asymptomatic at this time.     She is scheduled for first vaccination on 07/04/19, second on April 7th (t was not able to get)    She will repeat B cells at minimum 2 weeks after second vaccine and we can decide on redosing at that time.     Labs ordered.     She states understanding of plan    Debra Bolton has now seen Dr. 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 Mildly active OD and mildly symptomatic for new floaters and blurriness, last Rituxan dose June 2020, pending repeat Rituxan two weeks after second COVID dose 07/25/19. Start PF qid now OD with weekly taper."    Given this finding we will treat 2 weeks after second vaccine    Per Dr. Rosendo Gros: In the future once immunized we should treat every 6 months if Ig levels okay or consider another treatment     She has ongoing workup for syncope/dizziness with cardiology- will have echo 07/31/19 and follow up 08/09/19. Will schedule infusion for after this.    She is also being scheduled for colonoscopy for workup of rectal bleeding.           Since this encounter she has had continued workup in addition to COVID positive 12/07/19 and received monoclonal antibody treatment at the  time. She has upcoming appointments with Dr. Candiss Norse 03/31/20 and Dr. Amedeo Plenty in cardiology. Recent notes suggest she feels her uveitis is worse.     I recommend labs to be done after 12/13 appointment in case Dr. Candiss Norse adds additional labs.     I let her know our general recommendations on the vaccines and timing.    I will discuss the rest of her recommendations with the neurology team

## 2020-03-28 ENCOUNTER — Telehealth (INDEPENDENT_AMBULATORY_CARE_PROVIDER_SITE_OTHER): Payer: Medicare Other | Admitting: Family Practice

## 2020-03-28 ENCOUNTER — Encounter: Payer: Self-pay | Admitting: Family Practice

## 2020-03-28 ENCOUNTER — Other Ambulatory Visit: Payer: Self-pay

## 2020-03-28 ENCOUNTER — Encounter (INDEPENDENT_AMBULATORY_CARE_PROVIDER_SITE_OTHER): Payer: Self-pay | Admitting: Family Practice

## 2020-03-28 DIAGNOSIS — R350 Frequency of micturition: Secondary | ICD-10-CM

## 2020-03-28 DIAGNOSIS — R5383 Other fatigue: Secondary | ICD-10-CM

## 2020-03-28 DIAGNOSIS — Z1231 Encounter for screening mammogram for malignant neoplasm of breast: Secondary | ICD-10-CM

## 2020-03-28 DIAGNOSIS — Z8616 Personal history of COVID-19: Secondary | ICD-10-CM

## 2020-03-28 DIAGNOSIS — Z1339 Encounter for screening examination for other mental health and behavioral disorders: Secondary | ICD-10-CM

## 2020-03-28 DIAGNOSIS — Z0000 Encounter for general adult medical examination without abnormal findings: Secondary | ICD-10-CM

## 2020-03-28 DIAGNOSIS — E039 Hypothyroidism, unspecified: Secondary | ICD-10-CM

## 2020-03-28 DIAGNOSIS — Z1389 Encounter for screening for other disorder: Secondary | ICD-10-CM

## 2020-03-28 DIAGNOSIS — Z79890 Hormone replacement therapy (postmenopausal): Secondary | ICD-10-CM

## 2020-03-28 DIAGNOSIS — R7989 Other specified abnormal findings of blood chemistry: Secondary | ICD-10-CM

## 2020-03-28 DIAGNOSIS — R0602 Shortness of breath: Secondary | ICD-10-CM

## 2020-03-28 DIAGNOSIS — R631 Polydipsia: Secondary | ICD-10-CM

## 2020-03-28 MED ORDER — THYROID 60 MG OR TABS
60.0000 mg | ORAL_TABLET | Freq: Every day | ORAL | 0 refills | Status: DC
Start: 2020-03-28 — End: 2020-04-28

## 2020-03-28 MED ORDER — THYROID 15 MG OR TABS
15.0000 mg | ORAL_TABLET | ORAL | 0 refills | Status: DC
Start: 2020-03-28 — End: 2020-04-28

## 2020-03-28 MED ORDER — ALBUTEROL SULFATE 108 (90 BASE) MCG/ACT IN AERS
2.0000 | INHALATION_SPRAY | Freq: Four times a day (QID) | RESPIRATORY_TRACT | 0 refills | Status: DC | PRN
Start: 2020-03-28 — End: 2020-04-22

## 2020-03-28 NOTE — Patient Instructions (Addendum)
Please follow up in 2-3 months for your medicare annual wellness physical    I ordered labs for you to get done see list of location below; please get fasting in the morning  [facility] (inside main hospital)  [address] 0-762  [place], [place] [address]  [phone]  Monday-Friday: 7:30 a.m. - 5 p.m.  Sunday: 7:30 - 10:30 a.m.   Closed on Saturdays     [facility]  [address] 1-129  [place], [place] [address]  [phone]  Monday-Friday: 8 a.m. - 4:30 p.m.    [facility]  [address]  [place], [place] [address]  [phone]  Monday-Friday: 7:30 a.m. - 4:30 p.m.   *Appointment required    [facility]  [address]  [address]   [place], [place] [address]  [phone]  Monday-Friday: 7 a.m. - 5 p.m.  *Walk-ins or appointments accepted   [facility]  [address]  [address]   [place], [place] [address]  [phone]  Monday-Friday: 8 a.m. - 5 p.m.  *Appointment required   [facility]  [address]  [address]  [place], [place] [address]  [phone]  Monday-Friday: 8:30 a.m. - 4:30 p.m.   *Appointment required  [place]  [address]  [address] Second Floor  [place], [place] [address]  [phone]  Monday-Friday: 8:30 a.m. - 4 p.m.   *Appointment required   [place]  [facility]  [address]  [place], [place] [address]  [phone]  Monday-Friday: 7:30 a.m. - 6 p.m.     [address], [place] [address]   [phone]  Monday-Friday: 7 a.m. - 8 p.m.  Saturday-Sunday: 8 a.m. - 12 p.m.  *Last patient check-in at 7:30 p.m.    [address]   [address]  [place], [place] [address]  [phone]  Monday-Friday: 7:30 a.m. - 5 p.m.  *Appointment required    [facility]  [place], [place] [address]  [phone]  Monday-Friday: 7:30 a.m. - 5 p.m.    [address]  [address]  , [place]  [address]  [phone]  Monday-Friday: 8 a.m. - 12:30 p.m. and 1 - 4:30 p.m.  (Closed for lunch: 12:30 - 1 p.m.)     [facility]  [address], [address] [place], [place] [address]  [phone]  Monday-Friday: 8 - 11:30 a.m. and 1 - 4 p.m.  (Closed for lunch: 11:30 a.m. - 1 p.m.)     [facility]   [address] [place], [place] [address]  [phone]  Monday-Friday: 8:30 a.m. - 4 p.m.   *Appointment required

## 2020-03-28 NOTE — Interdisciplinary (Signed)
Are you in the state of [place] for today's MCVV?  yes

## 2020-03-28 NOTE — Progress Notes (Signed)
FAMILY MEDICINE TELEMEDICINE PROGRESS NOTE    CC:    Chief Complaint   Patient presents with    COVID-19 Re-check / Follow Up    Refill Request    Forms     discuss disability forms         ---------------------(data below generated by Christin Bach, MD)--------------------     Patient Verification & Telemedicine Consent & Financial Waiver:    1.   Identity: I have verified this patient's identity to be accurate.  2.   Consent: I verify consent has been secured in one of the following methods: (a) obtained written/ online attestation consent (via MyChartVideoVisit pathway), (b) the spoke-side provider has obtained verbal or written consent from patient/surrogate (if this is a "provider to provider" evaluation), or (c) in all other cases, I have personally obtained verbal consent from the patient/ surrogate (noting all elements below) to perform this voluntary telemedicine evaluation (including obtaining history, performing examination and reviewing data provided by the patient).   The patient/ surrogate has the right to refuse this evaluation.  I have explained risks (including potential loss of confidentiality), benefits, alternatives, and the potential need for subsequent face to face care. Patient/ surrogate understands that there is a risk of medical inaccuracies given that our recommendations will be made based on reported data (and we must therefore assume this information is accurate).  Knowing that there is a risk that this information is not reported accurately, and that the telemedicine video, audio, or data feed may be incomplete, the patient agrees to proceed with evaluation and holds [place] harmless knowing these risks.  3.   Healthcare Team: The patient/ surrogate has been notified that other healthcare professionals (including students, residents and Metallurgist) may be involved in this audio-video evaluation.   All laws concerning confidentiality and patient access to medical records and  copies of medical records apply to telemedicine.  4.   Privacy: If this is a Radiographer, therapeutic Visit, the patient/ surrogate has received the [place] Notice of Privacy Practices via E-Checkin process.  For all other video visit techniques, I have verbally provided the patient/ surrogate with the [place] in [place] (https://[email or web address]) or Spanish (https://[email or web address]://www.matthews.[email or web address]).  The patient/ surrogate acknowledges both being provided the NPP link, and has been offered to have the NPP mailed to the patient/ surrogate by [place] mail.  The patient/ surrogate has voiced understanding an acknowledgement of receipt of this NPP web address.  If the patient/surrogate has elected to receive the NPP via [place] mail, I verify that the NPP will be sent promptly to the patient/surrogate via [place] mail.  5.   Capacity: I have reviewed this above verification and consent paragraph with the patient/ surrogate and the patient is capacitated or has a surrogate. If the patient is not capacitated to understand the above, and no surrogate is available, since this is not an emergency evaluation, the visit will be rescheduled until such time that the patient can consent, or the surrogate is available to consent. If this is an emergency evaluation and the patient is not capacitated to understand the above, and no surrogate is available, I am proceeding with this evaluation as this is felt to be an emergency setting and no appropriate specialist is available at the bedside to perform these evaluations.  6.   Financial Waiver: If this is a Radiographer, therapeutic Visit, the patient has been made aware of the financial waiver via E-Checkin process.  For all other video visit techniques, an  E-Checkin process is not performed.  As such, I have personally verbally informed the patient/ surrogate that this evaluation will be a billable encounter similar to an in-person clinic visit, and the patient/ surrogate has agreed  to pay the fee for services rendered.  If we are billing insurance for the patient's telehealth visit, her out-of-pocket cost will be determined based on her plan and will be billed to her.  The patient/ surrogate has also been informed that if the patient does not have insurance or does not wish to use insurance, [facility] [employer] price for a primary care telehealth visit is $59.00 and specialist telehealth visit is $88.00.  I have further informed the patient/ surrogate that in the event the patient has additional services provided in conjunction with the specialty visit (Ex. Psychotherapy services), those services will be billed at the current rate less a 45% discount.  7.   Intra-State Location: The patient/ surrogate attests to understanding that if the patient accesses these services from a location outside of [place], that the patient does so at the patient's own risk and initiative and that the patient is ultimately responsible for compliance with any laws or regulations associated with the patient's use.  8.   Specific Use:The patient/ surrogate understands that [facility] makes no representation that materials or servicesdelivered via telecommunication services, or listed on telemedicine websites, are appropriate or available for use in any other location.           Demographics:  Medical Record #: [id number]  Date: March 28, 2020  Patient Name: Debra Bolton  DOB: [birth year]  Age: 59 year old  Sex: female  Location: Home address on file     Evaluator(s):  Janki Degrasse was evaluated by me today.    Cl[facility] Location[facility]  [address] [place] [address]        SUBJECTIVE:    Debra Bolton is a 59 year old female who is being seen for the following issues:    HPI by Problem:     Covid- She had covid in August, feels like has residual symptoms  She has fatigue  She sometimes has cough, not daily, once or twice a  week  Sometimes harder to concentrate, cognitive issue, but also could be related to [place]  She had her booster on the 1st  She was vaccinated to Covid       She has rheumatology and neurology appointment coming up    She has been feeling pretty tired since she had Covid infection  A lot more fatigue a little bit of shakiness  She was doing yoga to stay well and has not had the energy to do that  She is feeling thirsty all the time  She is urinating all the time     Appetite- she is feeling less hungry    She has gained about 10 pounds  She is pretty active still, she is not laying on the couch all the time      Sometimes has cough, intermittent, sounds croupy, not often might be lingering, not daily, if she swallows wrong, that pulls the cough up, non productive cough    She is supposed to have cataract surgery on 24th of January      No pain with urination    She gets standard insurance paperwork that she is disabled- due to [place], will drop off to be  filled out      She still has some fatigue, she is not doing as much, not as dizzy  She still has the ache in her chest intermittently, had negative stress ECHO test- saw cardiology in April, seeing cardiology on the 20th    She did not get colonoscopy done yet, wants to get after she sees cardiology    Needs refill of albuterol in case she needs it, it is expired, has not needed it for a long time      Review of Systems:   As per HPI    Patient Active Problem List   Diagnosis    Multiple sclerosis (CMS-HCC)    IgG4 related disease (CMS-HCC)    Uveitis of right eye    Posterior subcapsular age-related cataract, right eye    Mood disorder (CMS-HCC)    PTSD (post-traumatic stress disorder)    Adequate exercise, at or above goal    Rotator cuff tear    Dental caries, unspecified    Deposits (accretions) on teeth    Rectal bleeding       Outpatient Medications Prior to Visit   Medication Sig Dispense Refill    albuterol (VENTOLIN HFA) 108 (90 Base) MCG/ACT inhaler  Inhale 2 puffs by mouth every 6 hours as needed for Wheezing. 3 Inhaler 3    Amantadine HCl 100 MG tablet Take 100 mg by mouth 2 times daily. Takes PRN 60 tablet 0    baclofen (LIORESAL) 10 MG tablet Take 1 tablet (10 mg) by mouth 3 times daily. (Patient taking differently: Take 10 mg by mouth nightly. ) 90 tablet 11    benzonatate (TESSALON) 100 MG capsule Take 1 capsule (100 mg) by mouth every 8 hours as needed for Cough. 30 capsule 2    cyclopentolate (CYCLOGYL) 1 % ophthalmic solution Place 1 drop into right eye 3 times daily. 1 each 1    diazepam (VALIUM) 5 MG tablet Take 1 tablet (5 mg) by mouth daily as needed for Anxiety. 7 tablet 0    DULoxetine (CYMBALTA) 60 MG CR capsule Take 2 capsules (120 mg) by mouth daily. 180 capsule 3    gabapentin (NEURONTIN) 300 MG capsule Take 2 capsules (600 mg) by mouth nightly. 60 capsule 5    meclizine (ANTIVERT) 25 MG tablet Take 1 tablet (25 mg) by mouth every 8 hours as needed for Dizziness. 30 tablet 0    metoprolol succinate (TOPROL XL) 25 MG XL tablet Take 1 tablet (25 mg) by mouth daily. 30 tablet 3    nortriptyline (PAMELOR) 25 MG capsule Take 2 capsules (50 mg) by mouth nightly. 180 capsule 3    prednisoLONE acetate (PRED FORTE) 1 % ophthalmic suspension Place 1 drop into right eye 4 times daily. For two weeks then tid for one week then bid for one week then daily for one week then stop. 1 bottle 2    RITUXIMAB IV Every 6 months.      thyroid (ARMOUR THYROID) 15 MG tablet Take 1 tablet (15 mg) by mouth every other day. 45 tablet 2    thyroid (ARMOUR THYROID) 60 MG tablet Take 1 tablet (60 mg) by mouth daily. 90 tablet 2    tiZANidine (ZANAFLEX) 4 MG tablet Take 1.5 tablets (6 mg) by mouth at bedtime. 135 tablet 3     No facility-administered medications prior to visit.           OBJECTIVE:  Physical Exam: General Appearance: healthy, alert, no distress, pleasant affect,  cooperative.  Mental Status: Appearance/Cooperation: in no apparent distress and  well developed and well nourished  Attitude: pleasant   Behavior :normal  Eye Contact: normal  Attention Span: good  Speech: normal volume, rate, and pitch  Affect: full and appropriate  Respiratory: able to speak in full sentences, no sign of respiratory distress       LABS:  Results for orders placed or performed in visit on 09/01/19   COVID-19 Coronavirus Detection Assay at [facility]   Result Value Ref Range    COVID-19 Source Nasal     COVID-19 Coronavirus Result Not Detected        Exercise stress Echo 10/05/19     Summary:   1. Negative stress echo for ischemia.   2. Good exercise capacity for the patients age.   3. No significant LVOT gradient with exercise (peak velocity 2.2 m/s).   4. Normal blood pressure response to exercise.    ASSESSMENT & PLAN:  Sandoval Huberty is a 59 year old female was seen today for:  Jayleana was seen today for covid-19 re-check / follow up, refill request and forms.    Diagnoses and all orders for this visit:    Encounter for screening mammogram for malignant neoplasm of breast  -     Screening Mammogram With Digital Breast Tomosynthesis - Bilateral; Future    SOB (shortness of breath) on exertion  -     albuterol (VENTOLIN HFA) 108 (90 Base) MCG/ACT inhaler; Inhale 2 puffs by mouth every 6 hours as needed for Wheezing.  She has not needed albuterol in a long time- expired, refilled in case she needs it for wheezing  Hypothyroidism, unspecified type  -     thyroid (ARMOUR THYROID) 15 MG tablet; Take 1 tablet (15 mg) by mouth every other day.  -     thyroid (ARMOUR THYROID) 60 MG tablet; Take 1 tablet (60 mg) by mouth daily.  -     TSH, Blood - See Instructions; Future  -     Free Thyroxine, Blood Green Plasma Separator Tube; Future  Needs refill of thyroid medication, will only prescribe 30 days worth await thyroid testing results  Screened negative for alcohol use    Screened negative for drug use    Fatigue, unspecified type  -     Comprehensive Metabolic Panel (Expected today and  later); Future  -     CBC w/ Diff Lavender; Future  -     Vitamin B12, Blood Green Plasma Separator Tube; Future  -     Folic Acid, Blood Yellow serum separator tube; Future  -     Vitamin D, 25-OH Total Yellow serum separator tube; Future  Could be from long Covid- however will also work up for other possibilities, given polyuria polydipsia need to rule out DM  Frequent urination  -     Glycosylated Hgb(A1C), Blood [place]; Future  -     Urinalysis with Culture Reflex, when indicated; Future    Polydipsia  -     Glycosylated Hgb(A1C), Blood [place]ender; Future    Preventative health care  -     Lipid Panel Green Plasma Separator Tube; Future    Abnormal thyroid screen (blood)   -     Glycosylated Hgb(A1C), Blood [place]ender; Future    Long-term current use of thyroid hormone replacement therapy   -     Vitamin D, 25-OH Total Yellow serum separator tube; Future        Health Maintenance  Topic Date Due    Shingles Vaccine (1 of 2) Never done    Medicare Annual Wellness Visit  Never done    Influenza (1) 11/18/2019    Breast Cancer Screen  02/20/2020    PHQ2 depression screen  08/05/2020    Colorectal Cancer Screening  01/16/2022    Cervical Cancer Screening  01/19/2022    Tetanus (2 - Td or Tdap) 01/20/2027    Hepatitis C Screening  Completed    COVID-19 Vaccine  Completed    Polio Vaccine  Aged Out    HPV Vaccine <= 53 Yrs  Aged Out    Meningococcal MCV4 Vaccine  Aged Out    Pneumococcal Vaccine  Aged Out       Return for 2-3 months follow up medicare wellness physical.    Patient Instructions   Please follow up in 2-3 months for your medicare annual wellness physical    I ordered labs for you to get done see list of location below; please get fasting in the morning  [facility] (inside main hospital)  [address], Room 5-638  [place], [place] [address]  [phone]  Monday-Friday: 7:30 a.m. - 5 p.m.  Sunday: 7:30 - 10:30 a.m.   Closed on Saturdays     [facility]  [address] Room [address]-129  [place], [place] [address]  [phone]  Monday-Friday: 8 a.m. - 4:30 p.m.    [facility]  [address]  [place], [place] [address]  [phone]  Monday-Friday: 7:30 a.m. - 4:30 p.m.   *Appointment required    [facility]  [address]  [address]   [place], [place] [address]  [phone]  Monday-Friday: 7 a.m. - 5 p.m.  *Walk-ins or appointments accepted   [facility]  [address]  [address]   [place], [place] [address]  [phone]  Monday-Friday: 8 a.m. - 5 p.m.  *Appointment required   [place]  [address]  [address]  [place], [place] [address]  [phone]  Monday-Friday: 8:30 a.m. - 4:30 p.m.   *Appointment required  [place]  [address]  [address] Second Floor  [place], [place] [address]  [phone]  Monday-Friday: 8:30 a.m. - 4 p.m.   *Appointment required   [place]  [facility]  [address]  [place], [place] [address]  [phone]  Monday-Friday: 7:30 a.m. - 6 p.m.     [address], [place] [address]   [phone]  Monday-Friday: 7 a.m. - 8 p.m.  Saturday-Sunday: 8 a.m. - 12 p.m.  *Last patient check-in at 7:30 p.m.    [address]  [place], [place] [address]  [phone]  Monday-Friday: 7:30 a.m. - 5 p.m.  *Appointment required    [facility]  [place], [place] [address]  [phone]  Monday-Friday: 7:30 a.m. - 5 p.m.    [address]  [address]  , [place] [address]  [phone]  Monday-Friday: 8 a.m. - 12:30 p.m. and 1 - 4:30 p.m.  (Closed for lunch: 12:30 - 1 p.m.)     [facility]  [address] [place], [place] [address]  [phone]  Monday-Friday: 8 - 11:30 a.m. and 1 - 4 p.m.  (Closed for lunch: 11:30 a.m. -  1 p.m.)     [facility]   [address] [place], [place] [address]  [phone]  Monday-Friday: 8:30 a.m. - 4 p.m.   *Appointment required     I  personally spent 35 total minutes in face -to-face and non-face-to-face activities related to the patients visit today, excluding any separately reportable services/procedures.  Patient's questions answered.    Xayne Brumbaugh, MD

## 2020-03-31 ENCOUNTER — Telehealth (INDEPENDENT_AMBULATORY_CARE_PROVIDER_SITE_OTHER): Payer: Medicare Other | Admitting: Internal Medicine

## 2020-03-31 ENCOUNTER — Encounter (INDEPENDENT_AMBULATORY_CARE_PROVIDER_SITE_OTHER): Payer: Self-pay | Admitting: Internal Medicine

## 2020-03-31 DIAGNOSIS — Z79899 Other long term (current) drug therapy: Secondary | ICD-10-CM

## 2020-03-31 DIAGNOSIS — K1120 Sialoadenitis, unspecified: Secondary | ICD-10-CM

## 2020-03-31 DIAGNOSIS — H269 Unspecified cataract: Secondary | ICD-10-CM

## 2020-03-31 DIAGNOSIS — G35 Multiple sclerosis: Secondary | ICD-10-CM

## 2020-03-31 DIAGNOSIS — D8989 Other specified disorders involving the immune mechanism, not elsewhere classified: Secondary | ICD-10-CM

## 2020-03-31 DIAGNOSIS — H209 Unspecified iridocyclitis: Secondary | ICD-10-CM

## 2020-03-31 NOTE — Progress Notes (Signed)
Due to the COVID-19 pandemic and high risk of community-acquired infection with patient travel, I am proceeding with this telemedicine evaluation at the request of the patient.     Patient Verification & Telemedicine Consent:    I am proceeding with this evaluation at the direct request of the patient.  I have verified this is the correct patient and have obtained verbal consent and written consent from the patient/ surrogate to perform this voluntary telemedicine evaluation (including obtaining history, performing examination and reviewing data provided by the patient).   The patient/ surrogate has the right to refuse this evaluation.  I have explained risks (including potential loss of confidentiality), benefits, alternatives, and the potential need for subsequent face to face care. Patient/ surrogate understands that there is a risk of medical inaccuracies given that our recommendations will be made based on reported data (and we must therefore assume this information is accurate).  Knowing that there is a risk that this information is not reported accurately, and that the telemedicine video, audio, or data feed may be incomplete, the patient agrees to proceed with evaluation and holds [place] harmless knowing these risks. In this evaluation, we will be providing recommendations only. The patient/ surrogate has been notified that other healthcare professionals (including students, residents and Metallurgist) may be involved in this audio-video evaluation.   All laws concerning confidentiality and patient access to medical records and copies of medical records apply to telemedicine.  The patient/ surrogate has received the  Notice of Privacy Practices.  I have reviewed this above verification and consent paragraph with the patient/ surrogate.  If the patient is not capacitated to understand the above, and no surrogate is available, since this is not an emergency evaluation, the visit will be rescheduled  until such time that the patient can consent, or the surrogate is available to consent.    Demographics:   Medical Record #: [id number]   Date: March 31, 2020   Patient Name: Debra Bolton   DOB: Sep 21, 1960  Age: 59 year old  Sex: female  Location: Home address on file    Evaluator(s):   Brenya Mastrianni was evaluated by me today.    Clinic Location: [facility]  [address] [place]  [place] [address]    Chief Complaint  No chief complaint on file.  1. Multiple Sclerosis, relapsing form  2. Anterior uveitis  3. IgG related lymphadenopathy, chronic sialoadenitis    History of present Illness  59 year old female with history of multiple sclerosis diagnosed 1991, presented with numbness in both legs, seizures of 4 years duration.  Multiple relapses since then.  Previously treated with Copaxone.  Started Natalizumab in 2014 with good response and improvement in pain and numbness in the extremities.  Currently taking Cymbalta and gabapentin which appear to help with pain in her extremities.    History of anterior uveitis in 2018, treated with topical corticosteroids.  Seen by Op[facility]mology at [facility].     She is a chronic heavy smoker, around 2017, developed change in voice with cervical lymphadenopathy.  Underwent removal of enlarged tonsil and cervical lymph node to rule out malignancy.  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  CT chest abdomen pelvis 08/11/2015 which was negative for lymphadenopathy and for retroperitoneal fibrosis.  No pancreatitis.  Neck ultrasound showed bilateral submandibular gland enlargement with heterogeneous echotexture, likely secondary to  IgG4 related disease    Switched from natalizumab to rituximab for multiple sclerosis, in light of history of uveitis and IgG4 related disease, by Neurology.  Received rituximab 500 mg IV 2 doses 2 weeks apart in March  2019 and September 2019  1 g IV single dose June 2020, April 2021    Remains on rituximab every 6 months, however delayed last dose d/t pandemic . Tolerating rituximab well.  Overall felt better with respect to energy levels, submandibular pain and swelling after the infusion.  Abdominal pain has resolved.     Recurrence of uveitis in 06/2019, treated with pred forte.  Underwent rituximab April 2021  No recurrence of uveitis  Worsening right eye worsening vision in eye  Worsening symptoms few weeks increased arthralgias, fatigue, voice hoarseness, submandibular swelling.    Denies fever and weight loss  No infectious complications  IHKVQ-25 booster dose 03/19/2020    Review of Systems  As per HPI    Medications and Allergies were reviewed with the patient.       Current Outpatient Medications:     albuterol (VENTOLIN HFA) 108 (90 Base) MCG/ACT inhaler, Inhale 2 puffs by mouth every 6 hours as needed for Wheezing., Disp: 1 each, Rfl: 0    Amantadine HCl 100 MG tablet, Take 100 mg by mouth 2 times daily. Takes PRN, Disp: 60 tablet, Rfl: 0    baclofen (LIORESAL) 10 MG tablet, Take 1 tablet (10 mg) by mouth 3 times daily. (Patient taking differently: Take 10 mg by mouth nightly. ), Disp: 90 tablet, Rfl: 11    benzonatate (TESSALON) 100 MG capsule, Take 1 capsule (100 mg) by mouth every 8 hours as needed for Cough., Disp: 30 capsule, Rfl: 2    cyclopentolate (CYCLOGYL) 1 % ophthalmic solution, Place 1 drop into right eye 3 times daily., Disp: 1 each, Rfl: 1    diazepam (VALIUM) 5 MG tablet, Take 1 tablet (5 mg) by mouth daily as needed for Anxiety., Disp: 7 tablet, Rfl: 0    DULoxetine (CYMBALTA) 60 MG CR capsule, Take 2 capsules (120 mg) by mouth daily., Disp: 180 capsule, Rfl: 3    gabapentin (NEURONTIN) 300 MG capsule, Take 2 capsules (600 mg) by mouth nightly., Disp: 60 capsule, Rfl: 5    meclizine (ANTIVERT) 25 MG tablet, Take 1 tablet (25 mg) by mouth every 8 hours as needed for Dizziness., Disp: 30 tablet,  Rfl: 0    metoprolol succinate (TOPROL XL) 25 MG XL tablet, Take 1 tablet (25 mg) by mouth daily., Disp: 30 tablet, Rfl: 3    nortriptyline (PAMELOR) 25 MG capsule, Take 2 capsules (50 mg) by mouth nightly., Disp: 180 capsule, Rfl: 3    prednisoLONE acetate (PRED FORTE) 1 % ophthalmic suspension, Place 1 drop into right eye 4 times daily. For two weeks then tid for one week then bid for one week then daily for one week then stop., Disp: 1 bottle, Rfl: 2    RITUXIMAB IV, Every 6 months., Disp: , Rfl:     thyroid (ARMOUR THYROID) 15 MG tablet, Take 1 tablet (15 mg) by mouth every other day., Disp: 15 tablet, Rfl: 0    thyroid (ARMOUR THYROID) 60 MG tablet, Take 1 tablet (60 mg) by mouth daily., Disp: 30 tablet, Rfl: 0    tiZANidine (ZANAFLEX) 4 MG tablet, Take 1.5 tablets (6 mg) by mouth at bedtime., Disp: 135 tablet, Rfl: 3    Allergies  Penicillins and Modafinil    Past Medical History:  Diagnosis Date    Asthma 1991    with pregnancy    Hypothyroidism     Major depressive disorder, single episode     MS (multiple sclerosis) (CMS-HCC)        Past Surgical History:   Procedure Laterality Date    leep      for cervical dysplasia    LYMPH NODE BIOPSY      IgG4 related disease    TONSILLECTOMY AND ADENOIDECTOMY      only tonsillectomy       Family History   Problem Relation Name Age of Onset    Other Mother Zara Chess     Thyroid Mother Zara Chess     Heart Disease Mother Velva Harman mcclain         heart failure    Cancer Father don mc clain         lung    Cancer Brother tom mcclain     Heart Disease Brother tom mcclain         heart attack    Cancer Sister Velva Harman mcclain         breast    Diabetes Sister suzanne mcclain     Thyroid Sister suzanne mcclain         car accident    Stroke Brother Don mcclain     Cancer Brother Don mcclain         prostate cancer    Cancer Brother john mc clain         copd lungs    Glaucoma Neg Hx      Macular Degeneration (AMD) Neg Hx       Social History      Tobacco Use    Smoking status: Former Smoker     Packs/day: 1.00     Years: 15.00     Pack years: 15.00     Types: Cigarettes     Quit date: 01/27/2016     Years since quitting: 4.1    Smokeless tobacco: Never Used   Substance Use Topics    Alcohol use: Yes     Alcohol/week: 2.0 standard drinks     Types: 2 Glasses of wine per week    Drug use: Never         Physical Exam  There were no vitals filed for this visit.  Gen: Alert, cooperative  Psych: Appropriate affect, pleasant  Neck: No visibly enlarged lymph nodes  Skin: No rheumatologic rashes or ulcers  Eyes: EOMI. No scleral icterus  ENT: No oral mucositis  Resp: Breathing comfortably, no tachypnea  Cardiac: Well perfused extremities, no cyanosis  Joint exam:  No visible joint swelling  Normal fist formation  Good range of motion in shoulder and pelvic girdles and remaining joints  Neuro: Oriented to time, place and person         Most recent labs and imaging were reviewed and discussed with the patient.  50 pages of outside records reviewed, outlined in HPI    08/08/2015  IgG4 level 215 upper limit 123m/dl  Total IgG 1050 upper limit 1590    01/12/17  IgG4 level 193  03/29/17:  IgG4 elevated at 205.  Normal IgG    03/2018 IgG4 163, normal IgG     Most recent labs reviewed with the patient  IgG4 135    Impression    ICD-10-CM ICD-9-CM   1. IgG4 related disease (CMS-HCC)  D89.89 279.8   2. Multiple sclerosis (CMS-HCC)  G35 340  3. High risk medication use  Z79.899 V58.69   4. Uveitis of right eye  H20.9 364.3   5. Sialadenitis  K11.20 527.2   6. Cataract, unspecified cataract type, unspecified laterality  H26.9 366.9       Mariamawit Pickney is a 59 year old female with multiple sclerosis, followed by Neurology.  Previously well controlled on treatment with natalizumab.    Switched to rituximab in March 2019 due to history of IgG4 related disease, with cervical lymphadenopathy, submandibular gland enlargement and recurrent uveitis of right eye.  Chronic nerve  pain well controlled with Cymbalta and gabapentin.    IgG4 related disease on histopathology of enlarged right cervical lymph nodes.  No pancreatitis, no other systemic lymphadenopathy on CT chest abdomen pelvis performed April 2017.    History 2 episodes of anterior uveitis in 2018, no recurrence.  Negative ANA HLA B27, Qft TB    Last rituximab April 2021. Worsening enlargement in other systemic symptoms, plan to repeat    PLAN:   repeat rituximab 1 g single dose Jan 2022   Repeat disease activity monitoring   Okay to pursue cataract surgery 3 weeks after rituximab    Orders Placed This Encounter   Procedures    [place] Service Request    CBC w/ Diff Lavender    CMP    C-Reactive Protein, Blood Green Plasma Separator Tube    Sedimentation Rate (ESR), Blood Lavender    IgG Subclass, Blood Yellow serum separator tube    IgG, Blood - See Instructions    Ig Panel (IgA, IgG, IgM) - See Instructions    Total B Cells (CD20), Blood Lavender        Return in about 3 months (around 06/29/2020).     I personally spent total 40 minutes in face-to-face and non-face-to-face activities related to the patients visit today, excluding any separately reportable services/procedures.

## 2020-04-01 ENCOUNTER — Other Ambulatory Visit: Payer: Self-pay | Admitting: Legal Medicine

## 2020-04-02 ENCOUNTER — Encounter (HOSPITAL_BASED_OUTPATIENT_CLINIC_OR_DEPARTMENT_OTHER): Payer: Self-pay | Admitting: Neurology

## 2020-04-02 ENCOUNTER — Ambulatory Visit
Admission: RE | Admit: 2020-04-02 | Discharge: 2020-04-02 | Disposition: A | Discharge status: Home / Self Care | Payer: Medicare Other | Attending: Internal Medicine | Admitting: Internal Medicine

## 2020-04-02 ENCOUNTER — Ambulatory Visit: Payer: Medicare Other | Attending: Neurology | Admitting: Neurology

## 2020-04-02 ENCOUNTER — Other Ambulatory Visit (HOSPITAL_BASED_OUTPATIENT_CLINIC_OR_DEPARTMENT_OTHER): Payer: Medicare Other

## 2020-04-02 VITALS — BP 113/78 | HR 80 | Temp 97.7°F | Resp 18 | Ht 64.0 in | Wt 135.1 lb

## 2020-04-02 DIAGNOSIS — R5383 Other fatigue: Secondary | ICD-10-CM

## 2020-04-02 DIAGNOSIS — Z79890 Hormone replacement therapy (postmenopausal): Secondary | ICD-10-CM

## 2020-04-02 DIAGNOSIS — F515 Nightmare disorder: Secondary | ICD-10-CM | POA: Insufficient documentation

## 2020-04-02 DIAGNOSIS — Z0000 Encounter for general adult medical examination without abnormal findings: Secondary | ICD-10-CM

## 2020-04-02 DIAGNOSIS — D8989 Other specified disorders involving the immune mechanism, not elsewhere classified: Secondary | ICD-10-CM | POA: Insufficient documentation

## 2020-04-02 DIAGNOSIS — R350 Frequency of micturition: Secondary | ICD-10-CM

## 2020-04-02 DIAGNOSIS — R631 Polydipsia: Secondary | ICD-10-CM | POA: Insufficient documentation

## 2020-04-02 DIAGNOSIS — D72810 Lymphocytopenia: Secondary | ICD-10-CM

## 2020-04-02 DIAGNOSIS — R7989 Other specified abnormal findings of blood chemistry: Secondary | ICD-10-CM | POA: Insufficient documentation

## 2020-04-02 DIAGNOSIS — Z1231 Encounter for screening mammogram for malignant neoplasm of breast: Secondary | ICD-10-CM | POA: Insufficient documentation

## 2020-04-02 DIAGNOSIS — G35 Multiple sclerosis: Secondary | ICD-10-CM | POA: Insufficient documentation

## 2020-04-02 DIAGNOSIS — R928 Other abnormal and inconclusive findings on diagnostic imaging of breast: Secondary | ICD-10-CM

## 2020-04-02 DIAGNOSIS — M797 Fibromyalgia: Secondary | ICD-10-CM | POA: Insufficient documentation

## 2020-04-02 DIAGNOSIS — G894 Chronic pain syndrome: Secondary | ICD-10-CM | POA: Insufficient documentation

## 2020-04-02 DIAGNOSIS — F339 Major depressive disorder, recurrent, unspecified: Secondary | ICD-10-CM | POA: Insufficient documentation

## 2020-04-02 DIAGNOSIS — E039 Hypothyroidism, unspecified: Secondary | ICD-10-CM | POA: Insufficient documentation

## 2020-04-02 LAB — C-REACTIVE PROTEIN, BLOOD: CRP: <0.3 mg/dL (ref <0.5)

## 2020-04-02 LAB — FREE THYROXINE, BLOOD: Free T4: 0.8 ng/dL — ABNORMAL LOW (ref 0.93–1.70)

## 2020-04-02 LAB — URINALYSIS WITH CULTURE REFLEX, WHEN INDICATED
Bilirubin: NEGATIVE
Blood: NEGATIVE
Glucose: NEGATIVE
Ketones: NEGATIVE
Leuk Esterase: NEGATIVE Leu/uL
Nitrite: NEGATIVE
Protein: NEGATIVE
Specific Gravity: 1.009 (ref 1.002–1.030)
Urobilinogen: NEGATIVE
pH: 6.5 (ref 5.0–8.0)

## 2020-04-02 LAB — COMPREHENSIVE METABOLIC PANEL, BLOOD
ALT (SGPT): 24 U/L (ref 0–33)
AST (SGOT): 25 U/L (ref 0–32)
Albumin: 4.6 g/dL (ref 3.5–5.2)
Alkaline Phos: 54 U/L (ref 35–140)
Anion Gap: 10 mmol/L (ref 7–15)
BUN: 15 mg/dL (ref 8–23)
Bicarbonate: 27 mmol/L (ref 22–29)
Bilirubin, Tot: 0.42 mg/dL (ref <1.2)
Calcium: 9.9 mg/dL (ref 8.5–10.6)
Chloride: 100 mmol/L (ref 98–107)
Creatinine: 0.75 mg/dL (ref 0.51–0.95)
GFR: >60 mL/min
Glucose: 94 mg/dL (ref 70–99)
Potassium: 4.1 mmol/L (ref 3.5–5.1)
Sodium: 137 mmol/L (ref 136–145)
Total Protein: 7.5 g/dL (ref 6.0–8.0)

## 2020-04-02 LAB — CBC WITH DIFF, BLOOD
ANC-Automated: 2.5 10*3/uL (ref 1.6–7.0)
Abs Basophils: 0 10*3/uL (ref <0.1)
Abs Eosinophils: 0.3 10*3/uL (ref 0.0–0.5)
Abs Lymphs: 2.2 10*3/uL (ref 0.8–3.1)
Abs Monos: 0.5 10*3/uL (ref 0.2–0.8)
Basophils: 1 %
Eosinophils: 5 %
Hct: 40.9 % (ref 34.0–45.0)
Hgb: 13.1 gm/dL (ref 11.2–15.7)
Lymphocytes: 40 %
MCH: 29.8 pg (ref 26.0–32.0)
MCHC: 32 g/dL (ref 32.0–36.0)
MCV: 93 um3 (ref 79.0–95.0)
MPV: 9.5 fL (ref 9.4–12.4)
Monocytes: 10 %
Plt Count: 303 10*3/uL (ref 140–370)
RBC: 4.4 10*6/uL (ref 3.90–5.20)
RDW: 11.8 % — ABNORMAL LOW (ref 12.0–14.0)
Segs: 45 %
WBC: 5.5 10*3/uL (ref 4.0–10.0)

## 2020-04-02 LAB — LIPID(CHOL FRACT) PANEL, BLOOD
Cholesterol: 292 mg/dL — ABNORMAL HIGH (ref <200)
HDL-Cholesterol: 121 mg/dL
LDL-Chol (Calc): 155 mg/dL (ref <160)
Non-HDL Cholesterol: 171 mg/dL
Triglycerides: 82 mg/dL (ref 10–170)

## 2020-04-02 LAB — IMMUNOGLOBULIN PANEL (IGA,IGG,IGM), BLOOD
IGA: 506 mg/dL — ABNORMAL HIGH (ref 70–400)
IGG: 1028 mg/dL (ref 700–1600)
IGM: 30 mg/dL — ABNORMAL LOW (ref 40–230)

## 2020-04-02 LAB — VITAMIN B12, BLOOD: Vitamin B12: 502 pg/mL (ref 232–1245)

## 2020-04-02 LAB — TSH, BLOOD: TSH: 0.6 u[IU]/mL (ref 0.27–4.20)

## 2020-04-02 LAB — SED RATE, BLOOD: Sed Rate: 5 mm/hr (ref 0–30)

## 2020-04-02 LAB — GLYCOSYLATED HGB(A1C), BLOOD: Glyco Hgb (A1C): 5.4 % (ref 4.8–5.8)

## 2020-04-02 LAB — FOLIC ACID, BLOOD: Folate: 14.1 ng/mL

## 2020-04-02 LAB — VITAMIN D, 25-OH TOTAL: Vitamin D, 25-Hydroxy: 33 ng/mL (ref 30–80)

## 2020-04-02 MED ORDER — BACLOFEN 10 MG OR TABS
10.0000 mg | ORAL_TABLET | Freq: Every evening | ORAL | 11 refills | Status: DC
Start: 2020-04-02 — End: 2020-04-29

## 2020-04-02 MED ORDER — NORTRIPTYLINE HCL 25 MG OR CAPS
50.0000 mg | ORAL_CAPSULE | Freq: Every evening | ORAL | 3 refills | Status: DC
Start: 2020-04-02 — End: 2021-03-09

## 2020-04-02 MED ORDER — DULOXETINE HCL 60 MG OR CPEP
120.0000 mg | ORAL_CAPSULE | Freq: Every day | ORAL | 11 refills | Status: DC
Start: 2020-04-02 — End: 2021-04-29

## 2020-04-02 MED ORDER — BUPROPION HCL 100 MG OR TABS
ORAL_TABLET | Freq: Every evening | ORAL | Status: DC
Start: ? — End: 2020-05-01

## 2020-04-02 MED ORDER — AMANTADINE HCL 100 MG OR TABS
100.0000 mg | ORAL_TABLET | Freq: Two times a day (BID) | ORAL | 0 refills | Status: AC
Start: 2020-04-02 — End: ?

## 2020-04-02 MED ORDER — GABAPENTIN 300 MG OR CAPS
600.0000 mg | ORAL_CAPSULE | Freq: Every evening | ORAL | 5 refills | Status: DC
Start: 2020-04-02 — End: 2021-01-20

## 2020-04-02 MED ORDER — TIZANIDINE HCL 4 MG OR TABS
4.0000 mg | ORAL_TABLET | Freq: Every evening | ORAL | 5 refills | Status: DC
Start: 2020-04-02 — End: 2020-05-13

## 2020-04-02 NOTE — Progress Notes (Signed)
Neurology Clinic Pharmacy Note:    Date:  04/02/20   S: Debra Bolton is a 59 year old female presenting to clinic for MS. Last seen on 06/01/19.    She is s/p Rituximab 500 mg X 2 doses (07/01/17 &07/19/17), 1000 mg X 2 (01/02/18 &01/16/18), 1000 mg X 1 (09/19/2018, 08/10/19). Patient is to be on every 6 months, but last dose delayed due to pandemic. Patient was COVID positive 11/2019.    Last seen by Dr. Candiss Norse in rheumatology on 03/31/20. Plan is to repeat rituximab 1000mg  x 1 in Jan 2022.       Medication reconciliation: Medication reconciliation was not performed by PharmD  Vitamin D 5,000 units daily: not on med list  Narcotics and who prescribes: [facility]  [facility]ty [facility]    Orders/prescriptions up to date/adequate refills: Orders for RTX 1g signed for 04/21/20 by Dr. Kathrynn Humble    Drug specific safety monitoring:  IgPanel  B Cell Subset  CBC    Vaccinations:  Flu annually  Prevnar (first)  Pneumovax (6 months later)  Pneumovax q5 years (wheelchair bound)  Current Immunizations  Never Reviewed    Name Date    COVID-19 Therapist, music)  03/19/2020 ,  07/27/2019 ,  07/04/2019    Influenza Vaccine (Unspecified)  12/04/2018    Influenza Vaccine >=6 Months  12/03/2018 ,  05/24/2018 ,  03/30/2017    Tdap  01/19/2017          Assessment/Plan:  1. Neurology: Per Dr. Rosendo Gros

## 2020-04-02 NOTE — Patient Instructions (Signed)
-  Repeat MRI brain w/ and w/o contrast  -B cell subsets ([facility] [id number]), immunoglobulins (drawn today)  -Rituximab in February after cataract surgery   -Follow up in 4-5 months

## 2020-04-02 NOTE — Progress Notes (Signed)
[facility]    First visit: 12/09/2015     Last visit: 06/01/2019      Current visit: 04/02/2020      Principle Neurological Diagnosis:   1. Clinically definite Multiple Sclerosis, relapsing form  2. Anterior uveitis  3. IgG4 related lymphadenopathy  4. Vestibular neuronitis    Identifying information: Debra Bolton is a 59 year old woman now living in [place] after living in [place] for the past 2 years. She is originally from [place], seen for 27 years by Dr. Lavon Paganini at [facility]. 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  She recently completed yoga teaching training. We reviewed the history and problem list in detail today which is documented in the following sections.   ______________________________________________________________________    MS History      Initial Symptoms (Date and Description): 1987 (~age 27), after delivery of first daughter, both legs would fall asleep during exercise  1988 grand mal seizure in doctor office  1989 another grand mal seizure in [place] while on vacation, was placed on tegretol for 2 years  1991 diagnosed with MS as she had more frequent episodes of fatigue, depression and numbness     Progressive Disease (approx onset and description): see above    Cumulative [facility] Visits (since 05/26/2018)    -Visit 05/26/2018: Reports 2 falls recently feels she is tripping and catching her feet (R>L); concerned that her balance is more off lately. Feels her legs are "jittery" or have subtle tremors/shakes; sees black spots in the periphery of R eye constantly; She tolerated the RItuximab well (last infusion on 01/16/2018); Initially noticed improvement in hoarse voice (related to swollen cervical LNs from IgG4 syndrome) but now returning;Dr Truman Hayward felt uveitis OD better on 09/09/17 but she feels inflammation is returning. Dr Truman Hayward is waiting several months to make sure  uveitis resolved before doing cataract surgery; Continues to have mouth ulcers continue to occur intermittently (sore raised white spot without bleeding but painful).     -Telephone visit 11/24/2018: Acute onset severe vertigo 10/05/2018 when she got up to go the bathroom. Persisted with nausea and ataxia ; bedridden for 2 days before vertigo became intermittent and positional; still occurring but 80 % improved. MRI brain 10/19/2018 with no new enlarging or active lesions. Brainstem looks normal  Reports amantadine helps fatigue but takes prn because she get too jittery with regular usage  Discussed risks of rituximab with COVID 19. Somewhat of a moot point since she received an infusion last on 09/18/2018. Traveling by car to [place] for daughter's small outdoor wedding in September. Discussed precautions    -06/01/2019 telephone follow-up: In the last year she has fallen 5 times including a recent fall: She went to the bathroom and fell forward reaching to the toilet paper. L head purple, CT Head / Face without fractures or ICH. She has noticed an association with alcohol use and will try to avoid this. 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 Recent UTI and endorses urinary hesitancy but not interested in starting medication yet.       -04/02/2020 in person follow up: Covid19 pneumonia in August, 2021 withy residual worsening fatigue . 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 Feels shakier. Cognitive issues, maintaining attention and completing complicated tasks. Reading is harder. Has been walking 3-5 miles per  day but has not been able to run/ do yoga. Tired by 5pm. Intermittent slurred speech. Has had extreme thirst. Gained 10 pounds. No falls in the past 6 months. Feels slow decline with regards to balance and energy.  Swallowing is a little more difficult, gags more than used to. Getting cataract surgery in January. Moved from [place], may want  to transfer care to neurologist in [place].     Disease Modifying Treatment History: [Documented Needle phobia: No]    1. Betaseron (~ 1 year on therapy)  2. Copaxone ( <1 year) discontinued because it worsened depression  3. Tysabri (~2014 - Sep 11, 2015)  4. Rituximab 500 mg X 2 doses (07/01/17 & 07/19/17), 1000 mg X 2 (01/02/18 & 01/16/18), 1000 mg X 1 (09/19/2018)    ______________________________________________________________________    Review of Systems: Complete 10 point review of symptoms from the patient self report questionnaire was reviewed with the patient today and scanned into the media tab. Any pertinent positives are listed above in the narrative history or problem list.    Allergies   Allergen Reactions   • Penicillins Rash   • Modafinil Diarrhea and Nausea and Vomiting       Current Outpatient Medications   Medication Sig   • albuterol (VENTOLIN HFA) 108 (90 Base) MCG/ACT inhaler Inhale 2 puffs by mouth every 6 hours as needed for Wheezing.   • Amantadine HCl 100 MG tablet Take 100 mg by mouth 2 times daily. Takes PRN   • baclofen (LIORESAL) 10 MG tablet Take 1 tablet (10 mg) by mouth nightly.   • benzonatate (TESSALON) 100 MG capsule Take 1 capsule (100 mg) by mouth every 8 hours as needed for Cough.   • buPROPion (WELLBUTRIN) 100 MG tablet Take by mouth at bedtime.   • cyclopentolate (CYCLOGYL) 1 % ophthalmic solution Place 1 drop into right eye 3 times daily.   • diazepam (VALIUM) 5 MG tablet Take 1 tablet (5 mg) by mouth daily as needed for Anxiety.   • DULoxetine (CYMBALTA) 60 MG CR capsule Take 2 capsules (120 mg) by mouth daily.   • gabapentin (NEURONTIN) 300 MG capsule Take 2 capsules (600 mg) by mouth nightly.   • meclizine (ANTIVERT) 25 MG tablet Take 1 tablet (25 mg) by mouth every 8 hours as needed for Dizziness.   • nortriptyline (PAMELOR) 25 MG capsule Take 2 capsules (50 mg) by mouth nightly.   • prednisoLONE acetate (PRED FORTE) 1 % ophthalmic suspension Place 1 drop into right eye 4 times  daily. For two weeks then tid for one week then bid for one week then daily for one week then stop.   • RITUXIMAB IV Every 6 months.   • thyroid (ARMOUR THYROID) 15 MG tablet Take 1 tablet (15 mg) by mouth every other day.   • thyroid (ARMOUR THYROID) 60 MG tablet Take 1 tablet (60 mg) by mouth daily.   • tiZANidine (ZANAFLEX) 4 MG tablet Take 1 tablet (4 mg) by mouth at bedtime.     No current facility-administered medications for this visit.       Past Medical History:   Diagnosis Date   • Asthma 1991    with pregnancy   • Hypothyroidism    • Major depressive disorder, single episode    • MS (multiple sclerosis) (CMS-HCC)      Past Surgical History:   Procedure Laterality Date   • leep      for cervical   dysplasia   • LYMPH NODE BIOPSY      IgG4 related disease   • TONSILLECTOMY AND ADENOIDECTOMY      only tonsillectomy       Family Medical History:  Family History   Problem Relation Name Age of Onset   • Other Mother rita mcclain    • Thyroid Mother rita mcclain    • Heart Disease Mother rita mcclain         heart failure   • Cancer Father don mc clain         lung   • Cancer Brother tom mcclain    • Heart Disease Brother tom mcclain         heart attack   • Cancer Sister rita mcclain         breast   • Diabetes Sister suzanne mcclain    • Thyroid Sister suzanne mcclain         car accident   • Stroke Brother Don mcclain    • Cancer Brother Don mcclain         prostate cancer   • Cancer Brother john mc clain         copd lungs   • Glaucoma Neg Hx     • Macular Degeneration (AMD) Neg Hx       ______________________________________________________________________    Examination:   BP 113/78 (BP Location: Right arm, BP Patient Position: Sitting, BP cuff size: Regular)    Pulse 80    Temp 97.7 °F (36.5 °C) (Temporal)    Resp 18    Ht 5' 4" (1.626 m)    Wt 61.3 kg (135 lb 2.3 oz)    SpO2 97%    BMI 23.20 kg/m²   General: Examination of the skin, joints and extremities revealed no abnormalities  ;  There were no  cervical, ocular or cranial bruits.     Cognitive/Behavioral: Examination of cognition, language and prosody revealed no abnormalities . Behavioral and affect was appropriate.    Cranial Nerves: Near visual acuity was 20/20-1 with corrective lenses (Previously J10 OD and J2 OS with PH).Visual fields were full  to confrontation testing. Lids were normal.  Pupils were 4mm  and reactive with no  RAPD . Fundoscopic exam revealed no definite optic nerve pallor. Eyes were orthotropic with bilateral rotary nystagmus and left end point nystagmus.  Pursuit and saccadic eye movements revealed few beats of lateral nystagmus in both directions. Facial movements revealed no abnormalities. Testing of facial sensation revealed no abnormalities. Hearing was normal  AD and normal  AS. Bulbar examination revealed no abnormalities .     Motor: Upper extremity strength was as follows (R/L): Shoulder abduction 5/5, Elbow extension 5/5, Wrist extension 5/5, Hand instrinsics 5/5. Rapid alternating movements were graded 1/1+. Spasticity ([place]) was rated 1/1. Lower extremity strength was as follows (R/L): Hip Flexors 5/5, Knee Flexors 5/5, Ankle dorsiflexors 5/5; Toe tapping was rating 1/1; Spasticity was rated 1+/1  There was no atrophy. There were no fasiculations.    Cerebellar: Finger to nose testing revealed no abnormalities  in the RUE and no abnormalities  in the LUE. Heel to shin testing revealed no abnormalities  in the RLE and no abnormalities  in the LLE. (see gait for description of midline/axial cerebellum dysfunction)    Sensory: Sensation to pp and temperature was normal. Vibration sensation duration (secs) was (R/L): Upper extremity middle finger ND/ND; Lower extremity big toe 5s/7s   Joint position sensation was   normal .      Reflexes: (R/L): Biceps 2+/3, BR 2+/3, Triceps 2+/3, Patella 3/3, Ankle 3/3. There was no clonus .     Gait Description: normal gait and station.  Ambulation was performed safely without   assistance. The patient was able to  tandem walk without faltering.   ----------------------------------------------------------------------------------------------------------------------  Performance Measures:  9-Hole PEG Test 04/02/2020 05/26/2018 06/17/2017   RUE  22 19.8 21.5   RUE (Best) 21.4 17.9 19.5   LUE 24.7 19.8 22.7   LUE (Best) 21.8 18.5 19.6       25 Ft. Ambulation Time 04/02/2020 05/26/2018 06/17/2017   25 ft Ambulation Time - 1st Trial (Seconds) 4.98 4.5 4.5   Independently or With Assistance? 0 - Independently Independently Independently   25 ft Ambulation Time - 2nd Trial (Seconds) 5.4 4.6 4.5   Independently or With Assistance? Independently Independently Independently       No flowsheet data found.    No flowsheet data found.    No flowsheet data found.    No flowsheet data found.    ----------------------------------------------------------------------------------------------------------------------    Review of Imaging Studies:  1. MRI brain 08/10/17 reviewed and compared to prior MRI dated 07/16/16: CLassic MS; New T2=3 (small discrete and all in DWM); relatively low burden. No gad lesions. Mild atrophy for age  59. MRI Brain 10/19/18 reviewed: no radiological evidence of disease activity    Review of Labs:  1. CSF reportedly with oligoclonal bands  JC virus 0.24  (09/11/15), 0.20 (01/02/16)      Assessment:   1.  Clinically definite Multiple Sclerosis, relapsing form, Stable clinically. MRI 08/10/2017 without activity but New T2 lesions since study dated 07/16/16; now on rituximab and tolerating well and most recent MRI 10/19/2018 (done because of vertigo) with no evidence of disease activity. No evidence for clinical relapse on exam, but with worsened fatigue after covid.     Atypical features (red flags) include: seizure at onset  Individual risk factors for disease activity and worsening disability include: IgG4 related disease    2. Disease modifying therapy: Rituximab      3. Symptoms (brief description )  and their management:    Neuropathic pain: Sometimes with full body, tension fire like pain. controlled on  current regimen of duloxetine 168m at bedtime, gabapentin 6011mat  bedtime,  nortriptyline 50g at bedtime   Spasms: Worse at the end of the day, controlled with current regimen of baclofen  1059mt bedtime, tizanadine 4mg21m bedtime, and diazepam 5mg 58m (uses  about every 2 weeks)   Fatigue: Sleeps well and depression well controlled. No obvious medication causes  and hypothyroidism well controlled. Provigil causes nausea. Amantadine helpful.  Worsened after having Covid in August 2021.   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     4. Mood/psychiatric therapy: Previous anxiety and depression, improved. No problems at present     5. Lifestyle issues and recommended  adjustments: None at present     6. Rehabilitation needs and DME requirements: None at present    7. Vocation/Disability Concerns: None at present       After discussing the options available and the potential benefits (pros) and risks (cons), we mutually decided on the following management plan    Plan:  -Repeat MRI brain w/ and w/o contrast  -B cell subsets ([facility] [id number]), immunoglobulins (drawn today)  -From MS perspective, Rituximab can wait until February after cataract surgery   -Follow up in 4-5 months    Patient seen and discussed with attending Dr. Kinkel     ,  MD  Neuroimmunology Fellow

## 2020-04-03 LAB — TOTAL B CELL (CD19), BLOOD
CD19 B-Cell %: 1 % — ABNORMAL LOW (ref 6–23)
CD19 B-Cell Abs: 27 cells/uL — ABNORMAL LOW (ref 50–350)

## 2020-04-04 ENCOUNTER — Other Ambulatory Visit (HOSPITAL_BASED_OUTPATIENT_CLINIC_OR_DEPARTMENT_OTHER): Payer: Self-pay | Admitting: Family Practice

## 2020-04-04 ENCOUNTER — Telehealth (INDEPENDENT_AMBULATORY_CARE_PROVIDER_SITE_OTHER): Payer: Self-pay | Admitting: Family Practice

## 2020-04-04 ENCOUNTER — Encounter (INDEPENDENT_AMBULATORY_CARE_PROVIDER_SITE_OTHER): Payer: Self-pay | Admitting: Family Practice

## 2020-04-04 ENCOUNTER — Encounter (HOSPITAL_BASED_OUTPATIENT_CLINIC_OR_DEPARTMENT_OTHER): Payer: Self-pay | Admitting: Neurology

## 2020-04-04 DIAGNOSIS — R928 Other abnormal and inconclusive findings on diagnostic imaging of breast: Secondary | ICD-10-CM

## 2020-04-04 LAB — B CELL SUBSET ANALYSIS
Activated CD21low 38-: 1 cells/uL — ABNORMAL LOW (ref 3–26)
Activated CD21low CD38- %: 3.8 % of CD19 (ref 1.2–9.0)
CD19+ B Cells %: 1.5 % Lymphs — ABNORMAL LOW (ref 6.4–22.0)
CD19+ B Cells: 31 cells/uL — ABNORMAL LOW (ref 110–450)
CD20+ %: 95.3 % of CD19 — ABNORMAL LOW (ref 96.0–100.0)
CD20+: 30 cells/uL — ABNORMAL LOW (ref 110–450)
Class-switched CD27+IgD-IgM- %: 6 % of CD19 (ref 5.1–22.0)
Class-switched CD27+IgD-IgM-: 2 cells/uL — ABNORMAL LOW (ref 11–61)
Non Switched CD27+IgD+IgM+ %: 11.4 % of CD19 (ref 2.4–15.0)
Non switched CD27+IgD+IgM: 4 cells/uL — ABNORMAL LOW (ref 5–46)
Plasmablasts CD38+IgM-: 1 cells/uL (ref 1–8)
Plasmablasts Cd38+IgM- %: 3.9 % of CD19 (ref 0.4–4.1)
Total Memory CD27+ %: 21.5 % of CD19 (ref 10.0–33.0)
Total Memory CD27+: 7 cells/uL — ABNORMAL LOW (ref 23–110)
Transitional CD38+IgM+ %: 45.9 % of CD19 — ABNORMAL HIGH (ref 0.7–5.9)
Transitional CD38+IgM+: 14 cells/uL (ref 1–17)

## 2020-04-04 LAB — TOTAL B CELLS (CD20), BLOOD
B Cells (CD20) %: <1 cell/uL — ABNORMAL LOW (ref 6–23)
B Cells (CD20) Absolute: <22 cells/uL — ABNORMAL LOW (ref 50–350)

## 2020-04-04 LAB — IGG SUBCLASS PANEL, BLOOD
IGG Subclass 1: 465 mg/dL (ref 240–1118)
IGG Subclass 2: 324 mg/dL (ref 124–549)
IGG Subclass 3: 12 mg/dL — ABNORMAL LOW (ref 21–134)
IGG Subclass 4: 135 mg/dL — ABNORMAL HIGH (ref 1–123)

## 2020-04-04 NOTE — Progress Notes (Signed)
Patient seen, discussed and examined with Dr Nathaniel Man on 04/02/2020. I personally reviewed and edited all elements of Dr Larrie Kass note and agree with the assessment and plan     59 year old with RRMS, recurrent anterior uveitis and IgG4 related lymphadenopathy  Nodes larger in neck; still recovering from [place] infection in August, Moved to [place]. Recommended establishing care with Andres Shad locally if travel too difficult    Repeat MRI brain w/ and w/o contrast  -B cell subsets ([facility] [id number]), immunoglobulins (drawn today)  -Rituximab in February after cataract surgery   -Follow up in 4-5 months      Time spent reviewing Epic notes,  communications in preparation for visit, face to face time, counseling, completing orders, and documenting encounter : 40 minutes

## 2020-04-06 ENCOUNTER — Other Ambulatory Visit: Payer: Self-pay

## 2020-04-07 ENCOUNTER — Encounter (INDEPENDENT_AMBULATORY_CARE_PROVIDER_SITE_OTHER): Payer: Self-pay | Admitting: Interventional Cardiology

## 2020-04-07 ENCOUNTER — Ambulatory Visit (INDEPENDENT_AMBULATORY_CARE_PROVIDER_SITE_OTHER): Payer: Medicare Other | Admitting: Interventional Cardiology

## 2020-04-07 ENCOUNTER — Other Ambulatory Visit: Payer: Self-pay | Admitting: Legal Medicine

## 2020-04-07 VITALS — BP 118/82 | HR 75 | Temp 97.8°F | Resp 16

## 2020-04-07 DIAGNOSIS — J449 Chronic obstructive pulmonary disease, unspecified: Secondary | ICD-10-CM

## 2020-04-07 DIAGNOSIS — Z8249 Family history of ischemic heart disease and other diseases of the circulatory system: Secondary | ICD-10-CM

## 2020-04-07 DIAGNOSIS — I517 Cardiomegaly: Secondary | ICD-10-CM

## 2020-04-07 DIAGNOSIS — R55 Syncope and collapse: Secondary | ICD-10-CM

## 2020-04-07 NOTE — Progress Notes (Signed)
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 She is here for follow on stress echo results.     Echo showed dynamic LVOT obstruction and mild LVH. 1 week Event monitor was essentially normal. Carotid duplex also normal. Stress echo without ischemia or significant worsening of LVOT obstruction. She also had normal PYP scan and IF study both did not suggest amyloid.     Since last visit she denies dizziness event. She did have COVID-19 infection in 11/2019 and felt sick for 10 days without requiring hospitalization. Since then she feels tired and fatigued and gained 10 pounds. 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   She walks  almost 5/week each for 45 min without issues.    Earlier in 2021 and 2020 she reported 6 falls, some of them were in restroom when she feels dizzy. One time after reaching out to toilet tissue paper she stumped forward and hit her head. She is been almost daily feeling dizzy when standing up. She felt on rare occasions heart racing feeling but does not correlate with dizziness. She does have known vertigo but this is different. She denies dizziness when exercising or sitting down, another pre-syncope event she reports is when she was getting out of bathtub.  She also reports very rare right chest bone pain that is  non exertional.      ROS: As per HPI, otherwise the remaining 12-point  ROS was reviewed and is negative.    PMH:  Patient Active Problem List    Diagnosis Date Noted    Rectal bleeding 06/25/2019     Added automatically from request for surgery [id number]      Dental caries, unspecified 05/23/2019     Deposits (accretions) on teeth 05/23/2019    Adequate exercise, at or above goal 10/16/2018    Rotator cuff tear 05/07/2018    Mood disorder (CMS-HCC) 03/29/2017    PTSD (post-traumatic stress disorder) 03/29/2017    Uveitis of right eye 02/04/2017     H/o of intermediate uveitis OD managed by systemic treatment for MS/IgG4 disease (Rituxan)    H/o of amblyopia OD: poor vision as child.    Posterior polar cataract OD: congenital?      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    Multiple sclerosis (CMS-HCC) 12/10/2015     Past Medical History:   Diagnosis Date    Asthma 1991    with pregnancy    Hypothyroidism     Major depressive disorder, single episode     MS (multiple sclerosis) (CMS-HCC)        SH:  Social History     Tobacco Use    Smoking status: Former Smoker     Packs/day: 1.00     Years: 15.00     Pack years: 15.00     Types: Cigarettes     Quit date: 01/27/2016     Years since quitting: 4.1    Smokeless tobacco: Never Used   Substance Use Topics    Alcohol use: Yes  Alcohol/week: 2.0 standard drinks     Types: 2 Glasses of wine per week    Drug use: Never     Smoking for total of 20 years , one pack per day. Quit 10 years ago.     FH:  family history includes Cancer in her brother, brother, brother, father, and sister; Diabetes in her sister; Heart Disease in her brother and mother; Other in her mother; Stroke in her brother; Thyroid in her mother and sister.     Brother MI in 45's   Another brother stroke - 8     Meds:  Outpatient Medications Marked as Taking for the 04/07/20 encounter (Office Visit) with Al Khiami, Neta Ehlers, MD   Medication Sig Dispense Refill    albuterol (VENTOLIN HFA) 108 (90 Base) MCG/ACT inhaler Inhale 2 puffs by mouth every 6 hours as needed for Wheezing. 1 each 0    Amantadine HCl 100 MG tablet Take 100 mg by mouth 2 times daily. Takes PRN 60 tablet 0    baclofen (LIORESAL) 10 MG  tablet Take 1 tablet (10 mg) by mouth nightly. 30 tablet 11    buPROPion (WELLBUTRIN) 100 MG tablet Take by mouth at bedtime.      cyclopentolate (CYCLOGYL) 1 % ophthalmic solution Place 1 drop into right eye 3 times daily. 1 each 1    diazepam (VALIUM) 5 MG tablet Take 1 tablet (5 mg) by mouth daily as needed for Anxiety. 7 tablet 0    DULoxetine (CYMBALTA) 60 MG CR capsule Take 2 capsules (120 mg) by mouth daily. 180 capsule 11    gabapentin (NEURONTIN) 300 MG capsule Take 2 capsules (600 mg) by mouth nightly. 60 capsule 5    meclizine (ANTIVERT) 25 MG tablet Take 1 tablet (25 mg) by mouth every 8 hours as needed for Dizziness. 30 tablet 0    nortriptyline (PAMELOR) 25 MG capsule Take 2 capsules (50 mg) by mouth nightly. 180 capsule 3    RITUXIMAB IV Every 6 months.      thyroid (ARMOUR THYROID) 15 MG tablet Take 1 tablet (15 mg) by mouth every other day. 15 tablet 0    thyroid (ARMOUR THYROID) 60 MG tablet Take 1 tablet (60 mg) by mouth daily. 30 tablet 0    tiZANidine (ZANAFLEX) 4 MG tablet Take 1 tablet (4 mg) by mouth at bedtime. 135 tablet 5       ALLERGIES:   Allergies   Allergen Reactions    Penicillins Rash    Modafinil Diarrhea and Nausea and Vomiting         PHYSICAL EXAM:  Vitals:    04/07/20 0838   BP: 118/82   BP Location: Left arm   BP Patient Position: Sitting   BP cuff size: Regular   Pulse: 75   Resp: 16   Temp: 97.8 F (36.6 C)   TempSrc: Temporal   SpO2: 98%     General: A&O x3, No apparent distress.  HEENT: PERRLA, EOMI, OP clear no evidence of thrush or exudate.   Neck: Supple, No JVD, no carotid bruit bilaterally, no cervical lymphadenopathy  Pul: Symmetric expansion of chest, CTAB, no wheezes, rhonci, or crackles  CV: Regular rate and rhythm, normal S1S2. No murmurs, rubs or gallops.   Abd: Soft, non-tender, normal apparent bowel sounds in all four quadrants. No rebound or guarding.  Ext: Warm and well perfused bilateral lower extremities. No clubbing, cyanosis, or edema.  + PT  and DP pulses bilaterally  Neuro: CN  II-XII grossly intact. No focal deficits are appreciated. No motor or sensory deficits noted.  Skin: no rashes, lesions, erythema, or ecchymosis.    Lab Results   Component Value Date    CHOL 292 (H) 04/02/2020    HDL 121 04/02/2020    LDLCALC 155 04/02/2020    TRIG 82 04/02/2020     Lab Results   Component Value Date    NA 137 04/02/2020    K 4.1 04/02/2020    CL 100 04/02/2020    BICARB 27 04/02/2020    BUN 15 04/02/2020    CREAT 0.75 04/02/2020    GLU 94 04/02/2020    [place] 9.9 04/02/2020     Lab Results   Component Value Date    WBC 5.5 04/02/2020    RBC 4.40 04/02/2020    HGB 13.1 04/02/2020    HCT 40.9 04/02/2020    MCV 93.0 04/02/2020    MCHC 32.0 04/02/2020    RDW 11.8 (L) 04/02/2020    PLT 303 04/02/2020    MPV 9.5 04/02/2020         STUDIES:    ECG 06/15/2019- (personally interpreted): NSR     Echo 2021  1. The left ventricular size is normal. The left ventricular systolic function is hyperdynamic.   2. Mild concentric left ventricular hypertrophy.   3. Mild or grade I (impaired relaxation pattern) left ventricular diastolic filling.   4. A dynamic LV outflow gradient is present 1.65 M/sec (11 mmHg) increasing to 2.4 M/sec (23 mmHg) with valsalva.   5. The right ventricular size is normal and systolic function is normal.   6. Mild pulmonary hypertension with right ventricular systolic pressure measuring 38 mmHg.   7. No previous study for comparison.    Event monitor 3/13/-07/06/2019- uploaded to media, not finally read yet (personally reviewed)   No arrhythmia noted, PAC<1 %    Carotid duplex 06/2019  No evidence of hemodynamically significant stenosis.    Stress echo 2021   1. Negative stress echo for ischemia.   2. Good exercise capacity for the patients age.   3. No significant LVOT gradient with exercise (peak velocity 2.2 m/s).   4. Normal blood pressure response to exercise.    07/2019  Serum immunofixation reveals normal polyclonal responses for IgG, IgA, IgM,    Kappa and lambda of appropriate intensity in this sample. There is no   evidence for a monoclonal protein.     SPECT PYP scan 2021  1. Nonspecific mild myocardial uptake, not strongly suggestive of Transthyretin (TTR) cardiac amyloidosis.  2. Low mA CT demonstrates focal sclerosis of the bilateral iliacs, partially visualized and incompletely characterized.    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 No family history of HCM. Previous dizziness could be related to this. Pyrophosphate SPECT study and plasma immunofixation does not suggest amyloid disease but PYP with mild uptake. Will consider cardiac MRI    -Can consider BB for recurrent episodes related to LVOT obstruction.   -Counseled patient about the importance of good hydration   -Consider cardiac MRI (Patient would like to defer for now)    **Syncope/ pre-syncope/dizziness: this appears to be postural and different from her central vertigo. Syncope  appeared to be consistent with common faint/vasovagal. Echo later showed LVOT dynamic obstruction which could explain some of her dizziness. Event monitor without arrhythmia.  Her postural symptoms could be due also to her progressive MS. Her orthostatics were not significant.  Carotid duplex normal. No recurrent episodes  -Lifestyle modifications, hydration, increase salt intake   -consider BB as above to decrease LVOT obstruction   -Below knee Compression stocking - low to intermediate level when active   -Consider EP referral for syncope work up.     **Family history of premature CAD. She is asymptomatic. Very rare chest pain appears to be not typical. Patient has good functional activity without cp. Hx of previous smoking 20 pack year.  Stress  echo was normal . ASCVD 10 year risk is 2.6%  -Lipid  Panel, CMP and HS CRP in 6 months   -weight loss  -coronary calcium score     **Multiple sclerosis and IGg4 disease: Patient should be ok to receive Rituximab dose.   -Advise good hydration   -Follow up with neurology and rheumatology in [place]     RTC in 6 months     Seleta Rhymes, MD, [facility], [facility]  Assistant Professor of Medicine   Interventional Cardiology    [facility]         LDL is HIGHER   Weight loss advised.  CORONARY CALCIUM SCORE   LIPID, CMP and HS CRP IN 6 MONTHS   Consider cardiac MRI

## 2020-04-07 NOTE — Patient Instructions (Addendum)
Please call [phone] to schedule your CT Heart Calcium Score.    Follow up in 6 months - see appt below.    If you have any questions or concerns about your plan of care please call our office at [phone]. Thank you for trusting [place] with your care.

## 2020-04-08 ENCOUNTER — Encounter (INDEPENDENT_AMBULATORY_CARE_PROVIDER_SITE_OTHER): Payer: Self-pay | Admitting: Nurse Practitioner

## 2020-04-08 ENCOUNTER — Ambulatory Visit
Admission: RE | Admit: 2020-04-08 | Discharge: 2020-04-08 | Disposition: A | Discharge status: Home / Self Care | Payer: Medicare Other | Attending: Family Practice | Admitting: Family Practice

## 2020-04-08 ENCOUNTER — Ambulatory Visit (HOSPITAL_BASED_OUTPATIENT_CLINIC_OR_DEPARTMENT_OTHER)
Admit: 2020-04-08 | Discharge: 2020-04-08 | Disposition: A | Discharge status: Home Health | Payer: Medicare Other | Attending: Family Practice | Admitting: Family Practice

## 2020-04-08 ENCOUNTER — Other Ambulatory Visit (INDEPENDENT_AMBULATORY_CARE_PROVIDER_SITE_OTHER): Payer: Medicare Other | Attending: Neurology

## 2020-04-08 DIAGNOSIS — M47816 Spondylosis without myelopathy or radiculopathy, lumbar region: Secondary | ICD-10-CM | POA: Diagnosis not present

## 2020-04-08 DIAGNOSIS — Z1389 Encounter for screening for other disorder: Secondary | ICD-10-CM | POA: Diagnosis not present

## 2020-04-08 DIAGNOSIS — M461 Sacroiliitis, not elsewhere classified: Secondary | ICD-10-CM | POA: Diagnosis not present

## 2020-04-08 DIAGNOSIS — G894 Chronic pain syndrome: Secondary | ICD-10-CM | POA: Diagnosis not present

## 2020-04-08 DIAGNOSIS — M48061 Spinal stenosis, lumbar region without neurogenic claudication: Secondary | ICD-10-CM | POA: Diagnosis not present

## 2020-04-08 DIAGNOSIS — M5136 Other intervertebral disc degeneration, lumbar region: Secondary | ICD-10-CM | POA: Diagnosis not present

## 2020-04-08 DIAGNOSIS — R928 Other abnormal and inconclusive findings on diagnostic imaging of breast: Secondary | ICD-10-CM

## 2020-04-08 DIAGNOSIS — G35 Multiple sclerosis: Secondary | ICD-10-CM

## 2020-04-10 LAB — B CELL SUBSET ANALYSIS
Activated CD21low 38-: 1 cells/uL — ABNORMAL LOW (ref 3–26)
Activated CD21low CD38- %: 3.6 % of CD19 (ref 1.2–9.0)
CD19+ B Cells %: 1.3 % Lymphs — ABNORMAL LOW (ref 6.4–22.0)
CD19+ B Cells: 31 cells/uL — ABNORMAL LOW (ref 110–450)
CD20+ %: 95.2 % of CD19 — ABNORMAL LOW (ref 96.0–100.0)
CD20+: 30 cells/uL — ABNORMAL LOW (ref 110–450)
Class-switched CD27+IgD-IgM- %: 9.4 % of CD19 (ref 5.1–22.0)
Class-switched CD27+IgD-IgM-: 3 cells/uL — ABNORMAL LOW (ref 11–61)
Non Switched CD27+IgD+IgM+ %: 6.5 % of CD19 (ref 2.4–15.0)
Non switched CD27+IgD+IgM: 2 cells/uL — ABNORMAL LOW (ref 5–46)
Plasmablasts CD38+IgM-: 0 cells/uL — ABNORMAL LOW (ref 1–8)
Plasmablasts Cd38+IgM- %: 1.5 % of CD19 (ref 0.4–4.1)
Total Memory CD27+ %: 17.1 % of CD19 (ref 10.0–33.0)
Total Memory CD27+: 5 cells/uL — ABNORMAL LOW (ref 23–110)
Transitional CD38+IgM+ %: 31 % of CD19 — ABNORMAL HIGH (ref 0.7–5.9)
Transitional CD38+IgM+: 10 cells/uL (ref 1–17)

## 2020-04-21 ENCOUNTER — Telehealth (INDEPENDENT_AMBULATORY_CARE_PROVIDER_SITE_OTHER): Payer: Self-pay | Admitting: Family Practice

## 2020-04-21 DIAGNOSIS — R0602 Shortness of breath: Secondary | ICD-10-CM

## 2020-04-22 ENCOUNTER — Ambulatory Visit (HOSPITAL_BASED_OUTPATIENT_CLINIC_OR_DEPARTMENT_OTHER): Payer: Medicare Other

## 2020-04-22 ENCOUNTER — Encounter (INDEPENDENT_AMBULATORY_CARE_PROVIDER_SITE_OTHER): Payer: Self-pay | Admitting: Family Practice

## 2020-04-22 ENCOUNTER — Ambulatory Visit (HOSPITAL_BASED_OUTPATIENT_CLINIC_OR_DEPARTMENT_OTHER): Admit: 2020-04-22 | Discharge: 2020-04-22 | Disposition: A | Discharge status: Home Health | Payer: Medicare Other

## 2020-04-22 NOTE — Telephone Encounter (Signed)
From: Arnetha Courser  To: [place] Pink, MD  Sent: 04/22/2020 3:19 PM PST  Subject: covid    I was out of town for the holidays, and always wearing a n95 mask, but since I returned I am sick with fever and cough. Since I live in [place] and am quite a distance from [place] can I get a test sent to me. I was scheduled for a Rutuxin infusion today for the MS and IGg4, but had to cancel because I'm sick.

## 2020-04-22 NOTE — Telephone Encounter (Addendum)
Debra Bolton, CPhT  ([facility])      Bronchodilator Refill Protocol    Last visit in enc specialty: 03/28/2020     Recent Visits in This Encounter Department     Date Provider Department Visit Type Primary Dx    03/28/2020 Edi, Jolene Schimke, MD [place] [facility] Home Health Telemedicine Encounter for screening mammogram for malignant neoplasm of breast    12/19/2019 Bonnita Levan, MD [place] [facility] Home Health Telemedicine COVID-19 virus infection    12/14/2019 Herma Mering, MD [place] [facility] Home Health Telemedicine COVID-19 virus infection    12/13/2019 Herma Mering, MD [place] [facility] Home Health Telemedicine COVID-19 virus infection    12/11/2019 Ronney Asters, MD [place] [facility] Home Health Telemedicine COVID-19 virus infection         Next f/u appt due:  2-3 mos pe   Next appt in enc specialty: Visit date not found      Future Appointments 04/22/2020 - 04/21/2025              Date Visit Type Department Provider     04/28/2020  2:00 PM RETURN OPHTHALMOLOGY [place] [facility] Oletta Darter, MD, PhD    Appointment Notes:     Follow up             10/06/2020  8:45 AM CARDIO RETURN [facility] Al Augustina Mood, MD    Appointment Notes:     6 month f/u                 Per ov 03/28/20  Needs refill of albuterol in case she needs it, it is expired, has not needed it for a long time  SOB (shortness of breath) on exertion  -     albuterol (VENTOLIN HFA) 108 (90 Base) MCG/ACT inhaler; Inhale 2 puffs by mouth every 6 hours as needed for Wheezing.  She has not needed albuterol in a long time- expired, refilled in case she needs it for wheezing    LABS required:  (None)    Monitoring required:   (None)    -----------------------------------------------------------------------------------------------------------    The above technician note was evaluated by the pharmacist.  Pharmacist Assessment and Plan:     90 + 0 RF based on previous hx due for PE    Macon Large, PharmD, BCPS   [facility]   Refill and Prior Auth Clinical Services  Phone:  [phone]  Ext:  [phone]

## 2020-04-23 ENCOUNTER — Ambulatory Visit (HOSPITAL_BASED_OUTPATIENT_CLINIC_OR_DEPARTMENT_OTHER): Payer: Medicare Other

## 2020-04-23 MED ORDER — ALBUTEROL SULFATE 108 (90 BASE) MCG/ACT IN AERS
2.00 | INHALATION_SPRAY | Freq: Four times a day (QID) | RESPIRATORY_TRACT | 0 refills | Status: DC | PRN
Start: 2020-04-23 — End: 2020-07-22

## 2020-04-23 NOTE — Telephone Encounter (Signed)
Scheduling Request:   [place] [facility]     Please remind pt to schedule f/u appt w Dr Edi, Rina S, for 2-3 mos pe , was due on or after 06/26/20.     Authorized 90 days supply + 0 RF until f/u scheduled.         Thanks,    [facility] [employer]   Refill and Prior [employer]  Phone:  [phone]  Ext:  [phone]

## 2020-04-23 NOTE — Telephone Encounter (Signed)
I have attempted to contact this patient by phone with the following results: answering machine, left message to return call.    I have attempted to contact this patient by phone with the following results: called patient, no answer, left voicemail to return call to clinic, phone number included..        Mychart message also sent to pt.     CALL CENTER:  When patient calls back:  Any RN can take the call

## 2020-04-24 NOTE — Telephone Encounter (Signed)
Left message for patient to call back.  Call Center agent, if applicable please assist with:  • Relaying Message  • Book appointment if needed  • Verify best way to reach patient  Please don't create new encounter.  Thank you.

## 2020-04-26 ENCOUNTER — Other Ambulatory Visit (HOSPITAL_BASED_OUTPATIENT_CLINIC_OR_DEPARTMENT_OTHER): Payer: Self-pay | Admitting: Neurology

## 2020-04-26 DIAGNOSIS — G35 Multiple sclerosis: Secondary | ICD-10-CM

## 2020-04-27 ENCOUNTER — Telehealth (INDEPENDENT_AMBULATORY_CARE_PROVIDER_SITE_OTHER): Payer: Self-pay | Admitting: Family Practice

## 2020-04-27 DIAGNOSIS — E039 Hypothyroidism, unspecified: Secondary | ICD-10-CM

## 2020-04-28 ENCOUNTER — Ambulatory Visit (INDEPENDENT_AMBULATORY_CARE_PROVIDER_SITE_OTHER): Payer: Medicare Other | Admitting: Ophthalmology

## 2020-04-28 ENCOUNTER — Telehealth (INDEPENDENT_AMBULATORY_CARE_PROVIDER_SITE_OTHER): Payer: Self-pay | Admitting: Family Practice

## 2020-04-28 ENCOUNTER — Encounter (INDEPENDENT_AMBULATORY_CARE_PROVIDER_SITE_OTHER): Payer: Self-pay | Admitting: Ophthalmology

## 2020-04-28 NOTE — Telephone Encounter (Signed)
*  The requested med passed all protocol parameters in the associated med info guide - Protocol details reviewed by pharmacist.         *The requested med passed all protocol parameters in the associated med info guide - Protocol details reviewed by pharmacist.

## 2020-04-28 NOTE — Telephone Encounter (Signed)
Incoming mail:     Journalist, newspaper.     Labels placed and left in Dr. Aaron Edelman inbox for review.

## 2020-04-28 NOTE — Telephone Encounter (Signed)
A user error has taken place: encounter opened in error, closed for administrative reasons.

## 2020-04-28 NOTE — Telephone Encounter (Signed)
This form looks extensive it is for disability, I will have to go through some of the questions with her, can be video if necessary can you get her a video 40 min with me for disability forms?  I have to make sure all the responses are accurate and correct.  I have slots on 1/24 if needed  Thanks

## 2020-04-28 NOTE — Telephone Encounter (Signed)
Lm for patient to call office, please assist patient in obtaining a 40 minute video visit for 01/24 to discuss forms. Thank you!

## 2020-04-29 ENCOUNTER — Other Ambulatory Visit (INDEPENDENT_AMBULATORY_CARE_PROVIDER_SITE_OTHER): Payer: Self-pay | Admitting: Family Practice

## 2020-04-29 ENCOUNTER — Encounter (INDEPENDENT_AMBULATORY_CARE_PROVIDER_SITE_OTHER): Payer: Self-pay | Admitting: Psychiatric/Mental Health

## 2020-04-29 DIAGNOSIS — E039 Hypothyroidism, unspecified: Secondary | ICD-10-CM

## 2020-04-29 MED ORDER — THYROID 60 MG OR TABS
60.00 mg | ORAL_TABLET | Freq: Every day | ORAL | 0 refills | Status: DC
Start: 2020-04-29 — End: 2020-07-17

## 2020-04-29 MED ORDER — THYROID 15 MG OR TABS
15.00 mg | ORAL_TABLET | ORAL | 0 refills | Status: DC
Start: 2020-04-29 — End: 2020-07-17

## 2020-04-29 MED ORDER — BACLOFEN 10 MG OR TABS
ORAL_TABLET | ORAL | 11 refills | Status: DC
Start: 2020-04-29 — End: 2021-04-29

## 2020-04-29 NOTE — Telephone Encounter (Signed)
From: Debra Bolton  To: Areta Haber, NP  Sent: 04/29/2020 1:17 PM PST  Subject: medication    Hi   This is Debra Bolton, it has been a while! Due to Covid I have been trying to limit appts. (not sure why considering I have all the vaccines and have had Covid twice). I am writing to see if you could refill my wellbutrin script. I believe because of this addition I have actually been able to totally quit cigarettes for over a year now!! I would be happy to set up a appt. on line at any time. Please note my pharmacy and address have changed. Let me know if you need any more information. Thanks Debra Bolton

## 2020-04-29 NOTE — Telephone Encounter (Signed)
OK to schedule pt in 1hr return general slot with Mickeal Skinner NP (use 2 back to back 30 min slots)

## 2020-04-29 NOTE — Telephone Encounter (Signed)
Scheduling Request:   [place] [facility]     Please remind pt to schedule f/u appt w Dr Rana Snare, Verl Blalock, for MAWV (video visit if appropriate), due on or after 06/26/20.     Authorized 90 days supply + 0 RF until f/u scheduled.         Thanks,    [facility]   Refill and Prior [employer]  Phone:  [phone]  Ext:  [phone]

## 2020-04-29 NOTE — Telephone Encounter (Signed)
Prior Auth Request for Armour Thyroid 15 MG and 60 MG has been placed in queue for processing. Please allow up to 72 business hours for initiation.     If this is an urgent request due to immediate therapy please re-route as high priority.       Thank you!     [facility]   Refill and Prior [place]

## 2020-04-29 NOTE — Telephone Encounter (Signed)
Pt scheduled appt via video 40 minutes 05/12/20.     FYI  No further action required by clinic.      Encounter created by Care Assist MA.  If further action required please route encounter to appropriate in clinic MA/LVN/Resident pool.

## 2020-04-30 ENCOUNTER — Encounter (INDEPENDENT_AMBULATORY_CARE_PROVIDER_SITE_OTHER): Payer: Self-pay | Admitting: Psychiatric/Mental Health

## 2020-04-30 DIAGNOSIS — F339 Major depressive disorder, recurrent, unspecified: Secondary | ICD-10-CM

## 2020-04-30 NOTE — Telephone Encounter (Signed)
Noted  

## 2020-04-30 NOTE — Telephone Encounter (Signed)
Prior Auth Request APPROVED for Armour thyroid 15 and 60        Valid until 04/18/21    Pharmacy has been notified and will contact patient when ready to be picked up.     No further action needed, closing encounter.       Thank you!    [facility]   Refill and Prior [place]

## 2020-04-30 NOTE — Telephone Encounter (Signed)
Prior Auth Request submitted for Armour Thyroid 15  on 04/30/20  via C[employer] Key # [id number]      ** Please allow 3-5 business days for determination from insurance**    Insurance Plan:  [employer] Ros[employer] (received from pharmacy)  Group:   RX ID:  [id number]  BIN:    PCN:      Diagnosis:  Hypothyroidism, unspecified type (E03.9)  ICD-10:    Medications Tried/Failed:    Justification:       PMAC will monitor status.     [facility]   Refill and Prior Auth Clinical Services      Armour Thyroid 60 mg (need to submit after 15 mg response)

## 2020-04-30 NOTE — Telephone Encounter (Signed)
Called patient left a message to contact Dr Edi to schedule a Video visit sometime in March, also inform prescription refill sent to Pharmacy.

## 2020-05-01 ENCOUNTER — Encounter (INDEPENDENT_AMBULATORY_CARE_PROVIDER_SITE_OTHER): Payer: Self-pay | Admitting: Psychiatric/Mental Health

## 2020-05-01 MED ORDER — BUPROPION HCL 100 MG OR TABS
150.0000 mg | ORAL_TABLET | Freq: Every day | ORAL | 2 refills | Status: DC
Start: 2020-05-01 — End: 2020-05-13

## 2020-05-06 DIAGNOSIS — M47816 Spondylosis without myelopathy or radiculopathy, lumbar region: Secondary | ICD-10-CM | POA: Diagnosis not present

## 2020-05-06 DIAGNOSIS — M461 Sacroiliitis, not elsewhere classified: Secondary | ICD-10-CM | POA: Diagnosis not present

## 2020-05-06 DIAGNOSIS — Z1389 Encounter for screening for other disorder: Secondary | ICD-10-CM | POA: Diagnosis not present

## 2020-05-06 DIAGNOSIS — M5136 Other intervertebral disc degeneration, lumbar region: Secondary | ICD-10-CM | POA: Diagnosis not present

## 2020-05-06 DIAGNOSIS — G894 Chronic pain syndrome: Secondary | ICD-10-CM | POA: Diagnosis not present

## 2020-05-06 DIAGNOSIS — M48061 Spinal stenosis, lumbar region without neurogenic claudication: Secondary | ICD-10-CM | POA: Diagnosis not present

## 2020-05-08 ENCOUNTER — Encounter (INDEPENDENT_AMBULATORY_CARE_PROVIDER_SITE_OTHER): Payer: Self-pay | Admitting: Family Practice

## 2020-05-08 ENCOUNTER — Encounter (INDEPENDENT_AMBULATORY_CARE_PROVIDER_SITE_OTHER): Payer: Self-pay | Admitting: Internal Medicine

## 2020-05-08 DIAGNOSIS — I6523 Occlusion and stenosis of bilateral carotid arteries: Secondary | ICD-10-CM | POA: Diagnosis not present

## 2020-05-08 DIAGNOSIS — I2510 Atherosclerotic heart disease of native coronary artery without angina pectoris: Secondary | ICD-10-CM | POA: Diagnosis not present

## 2020-05-08 DIAGNOSIS — I10 Essential (primary) hypertension: Secondary | ICD-10-CM | POA: Diagnosis not present

## 2020-05-08 DIAGNOSIS — Z720 Tobacco use: Secondary | ICD-10-CM | POA: Diagnosis not present

## 2020-05-08 DIAGNOSIS — E785 Hyperlipidemia, unspecified: Secondary | ICD-10-CM | POA: Diagnosis not present

## 2020-05-08 DIAGNOSIS — F17200 Nicotine dependence, unspecified, uncomplicated: Secondary | ICD-10-CM | POA: Diagnosis not present

## 2020-05-08 NOTE — Telephone Encounter (Signed)
From: Debra Bolton  To: Christin Bach, MD  Sent: 05/08/2020 3:23 PM PST  Subject: whooping cough...immunizations    we have an appt. (video) next week, I need to know if I am up to date on immunizations since I will be seeing my daughter in February. She is pregnant and I just want to know if everything on my end is good?   Thanks for the help  Debra Bolton

## 2020-05-12 ENCOUNTER — Telehealth (INDEPENDENT_AMBULATORY_CARE_PROVIDER_SITE_OTHER): Payer: Medicare Other | Admitting: Family Practice

## 2020-05-12 ENCOUNTER — Encounter (INDEPENDENT_AMBULATORY_CARE_PROVIDER_SITE_OTHER): Payer: Self-pay | Admitting: Family Practice

## 2020-05-12 ENCOUNTER — Other Ambulatory Visit: Payer: Self-pay

## 2020-05-12 DIAGNOSIS — H209 Unspecified iridocyclitis: Secondary | ICD-10-CM

## 2020-05-12 DIAGNOSIS — G35D Multiple sclerosis, unspecified: Secondary | ICD-10-CM

## 2020-05-12 DIAGNOSIS — Z723 Lack of physical exercise: Secondary | ICD-10-CM

## 2020-05-12 DIAGNOSIS — U071 COVID-19: Secondary | ICD-10-CM

## 2020-05-12 DIAGNOSIS — K921 Melena: Secondary | ICD-10-CM

## 2020-05-12 DIAGNOSIS — I517 Cardiomegaly: Secondary | ICD-10-CM

## 2020-05-12 DIAGNOSIS — Z029 Encounter for administrative examinations, unspecified: Secondary | ICD-10-CM

## 2020-05-12 DIAGNOSIS — G35 Multiple sclerosis: Secondary | ICD-10-CM

## 2020-05-12 DIAGNOSIS — U099 Post covid-19 condition, unspecified: Secondary | ICD-10-CM

## 2020-05-12 NOTE — Progress Notes (Signed)
FAMILY MEDICINE TELEMEDICINE PROGRESS NOTE    CC:    Chief Complaint   Patient presents with    Forms     ---------------------(data below generated by Christin Bach, MD)--------------------     Patient Verification & Telemedicine Consent & Financial Waiver:    1.   Identity: I have verified this patient's identity to be accurate.  2.   Consent: I verify consent has been secured in one of the following methods: (a) obtained written/ online attestation consent (via MyChartVideoVisit pathway), (b) the spoke-side provider has obtained verbal or written consent from patient/surrogate (if this is a "provider to provider" evaluation), or (c) in all other cases, I have personally obtained verbal consent from the patient/ surrogate (noting all elements below) to perform this voluntary telemedicine evaluation (including obtaining history, performing examination and reviewing data provided by the patient).   The patient/ surrogate has the right to refuse this evaluation.  I have explained risks (including potential loss of confidentiality), benefits, alternatives, and the potential need for subsequent face to face care. Patient/ surrogate understands that there is a risk of medical inaccuracies given that our recommendations will be made based on reported data (and we must therefore assume this information is accurate).  Knowing that there is a risk that this information is not reported accurately, and that the telemedicine video, audio, or data feed may be incomplete, the patient agrees to proceed with evaluation and holds [place] harmless knowing these risks.  3.   Healthcare Team: The patient/ surrogate has been notified that other healthcare professionals (including students, residents and Metallurgist) may be involved in this audio-video evaluation.   All laws concerning confidentiality and patient access to medical records and copies of medical records apply to telemedicine.  4.   Privacy: If this is a Magazine features editor Visit, the patient/ surrogate has received the [place] Notice of Privacy Practices via E-Checkin process.  For all other video visit techniques, I have verbally provided the patient/ surrogate with the [place] in [place] ([email or web address]://[email or web address]) or Spanish ([email or web address]://health.https://www.matthews.info/.aspx).  The patient/ surrogate acknowledges both being provided the NPP link, and has been offered to have the NPP mailed to the patient/ surrogate by [place] mail.  The patient/ surrogate has voiced understanding an acknowledgement of receipt of this NPP web address.  If the patient/surrogate has elected to receive the NPP via [place] mail, I verify that the NPP will be sent promptly to the patient/surrogate via [place] mail.  5.   Capacity: I have reviewed this above verification and consent paragraph with the patient/ surrogate and the patient is capacitated or has a surrogate. If the patient is not capacitated to understand the above, and no surrogate is available, since this is not an emergency evaluation, the visit will be rescheduled until such time that the patient can consent, or the surrogate is available to consent. If this is an emergency evaluation and the patient is not capacitated to understand the above, and no surrogate is available, I am proceeding with this evaluation as this is felt to be an emergency setting and no appropriate specialist is available at the bedside to perform these evaluations.  6.   Financial Waiver: If this is a Radiographer, therapeutic Visit, the patient has been made aware of the financial waiver via E-Checkin process.  For all other video visit techniques, an E-Checkin process is not performed.  As such, I have personally verbally informed the patient/ surrogate that this evaluation will be a billable encounter  similar to an in-person clinic visit, and the patient/ surrogate has agreed to pay the fee for services rendered.  If we are billing insurance for the patient's  telehealth visit, her out-of-pocket cost will be determined based on her plan and will be billed to her.  The patient/ surrogate has also been informed that if the patient does not have insurance or does not wish to use insurance, [facility] price for a primary care telehealth visit is $59.00 and specialist telehealth visit is $88.00.  I have further informed the patient/ surrogate that in the event the patient has additional services provided in conjunction with the specialty visit (Ex. Psychotherapy services), those services will be billed at the current rate less a 45% discount.  7.   Intra-State Location: The patient/ surrogate attests to understanding that if the patient accesses these services from a location outside of [place], that the patient does so at the patient's own risk and initiative and that the patient is ultimately responsible for compliance with any laws or regulations associated with the patient's use.  8.   Specific Use:The patient/ surrogate understands that [facility] makes no representation that materials or servicesdelivered via telecommunication services, or listed on telemedicine websites, are appropriate or available for use in any other location.           Demographics:  Medical Record #: [id number]  Date: May 12, 2020  Patient Name: Debra Bolton  DOB: 1960-11-11  Age: 60 year old  Sex: female  Location: Home address on file     Evaluator(s):  Kaylan Robyn was evaluated by me today.    Clinic Location: [facility]  [address] [place] [address]            SUBJECTIVE:    Debra Bolton is a 60 year old female who is being seen for the following issues:    HPI by Problem:     Paperwork- has disability paperwork that needs to be filled out regularly for MS and uveitis of eye    Prior COVID 19 infection- had covid once, and then after she returned from [place] for Christmas got sick again, had fever,  thinks had another covid infection, not as bad as first time she had covid. Tried to get covid tested and couldn't get it  Feels like energy is still decreased, about half of what it was  She is able to eat and drink and is able to stay hydrated    Dizziness- is still there but slowing down a bit  No syncope    Saw Dr Amedeo Plenty cardiology on 04/07/20 for mild LV hypertrophy with mild LVOT dynamic outflow obstruction, in past took beta blocker for awhile and stopped because it made her more tired, she wants to hold off on getting cardiac MRI, has follow up in 6 months with cardiology    Blood in stool-  She has not gotten colonoscopy yet, order is expired, was told needed to see cardiology and get cleared before getting colonoscopy  Bowel movements still has some blood with bowel movements, not a lot, not all the time but occasionally  She felt some cramping abdominal pain, not all the time  She has 6 bowel movements per day  Not constipated  Sometimes bowel movements are painful  She has noticed blood mixed in stool and after wiping   The most recent time occurred was 2  days ago  No vomiting    Appetite is good, has gained some weight  Before she had covid was doing yoga daily, now doing about 20 minutes of yoga once a week    Her endurance has decreased    [place] so she can walk outside    [place]- sees neurology  She missed her rituximab infusion because she was sick  Needs to get rescheduled      Right eye has cataract ,she was going to get surgery, see Dr. Frederico Hamman at [place], manages also her uveitis   She is not driving at all  Her depth perception is off     Albuterol uses as needed, feeling more winded, not sure if from covid, gets winded a lot easier  Not sure if helping  Using once a day  To go up the stairs is difficult for her  When going up the steps feeling exhausted  No chest tightness  But feels like might not be getting enough air    Started taking buproprion to stop smoking, has not had  cigarette for 2 years                Review of Systems:   As per HPI     Patient Active Problem List   Diagnosis    Multiple sclerosis (CMS-HCC)    IgG4 related disease (CMS-HCC)    Uveitis of right eye    Posterior subcapsular age-related cataract, right eye    Mood disorder (CMS-HCC)    PTSD (post-traumatic stress disorder)    Adequate exercise, at or above goal    Rotator cuff tear    Dental caries, unspecified    Deposits (accretions) on teeth    Rectal bleeding    Inadequate exercise - not at goal       Outpatient Medications Prior to Visit   Medication Sig Dispense Refill    albuterol (VENTOLIN HFA) 108 (90 Base) MCG/ACT inhaler Inhale 2 puffs by mouth every 6 hours as needed for Wheezing. 3 each 0    Amantadine HCl 100 MG tablet Take 100 mg by mouth 2 times daily. Takes PRN 60 tablet 0    baclofen (LIORESAL) 10 MG tablet TAKE 1 TABLET BY MOUTH EVERY DAY NIGHTLY 30 tablet 11    benzonatate (TESSALON) 100 MG capsule Take 1 capsule (100 mg) by mouth every 8 hours as needed for Cough. 30 capsule 2    buPROPion (WELLBUTRIN) 100 MG tablet Take 1.5 tablets (150 mg) by mouth daily. 45 tablet 2    cyclopentolate (CYCLOGYL) 1 % ophthalmic solution Place 1 drop into right eye 3 times daily. 1 each 1    diazepam (VALIUM) 5 MG tablet Take 1 tablet (5 mg) by mouth daily as needed for Anxiety. 7 tablet 0    DULoxetine (CYMBALTA) 60 MG CR capsule Take 2 capsules (120 mg) by mouth daily. 180 capsule 11    gabapentin (NEURONTIN) 300 MG capsule Take 2 capsules (600 mg) by mouth nightly. 60 capsule 5    meclizine (ANTIVERT) 25 MG tablet Take 1 tablet (25 mg) by mouth every 8 hours as needed for Dizziness. 30 tablet 0    nortriptyline (PAMELOR) 25 MG capsule Take 2 capsules (50 mg) by mouth nightly. 180 capsule 3    prednisoLONE acetate (PRED FORTE) 1 % ophthalmic suspension Place 1 drop into right eye 4 times daily. For two weeks then tid for one week then bid for one week then daily  for one week then  stop. 1 bottle 2    RITUXIMAB IV Every 6 months.      thyroid (ARMOUR THYROID) 15 MG tablet Take 1 tablet (15 mg) by mouth every other day. 45 tablet 0    thyroid (ARMOUR THYROID) 60 MG tablet Take 1 tablet (60 mg) by mouth daily. 90 tablet 0    tiZANidine (ZANAFLEX) 4 MG tablet Take 1 tablet (4 mg) by mouth at bedtime. 135 tablet 5     No facility-administered medications prior to visit.           OBJECTIVE:  Physical Exam: General Appearance: healthy, alert, no distress, pleasant affect, cooperative.  Mental Status: Appearance/Cooperation: in no apparent distress and well developed and well nourished  Attitude: pleasant   Behavior :normal  Eye Contact: normal  Attention Span: good  Speech: normal volume, rate, and pitch  Affect: full and appropriate  Respiratory: able to speak in full sentences, no accessory muscle use noted       LABS:  Results for orders placed or performed in visit on 04/08/20   B Cell Subset Analysis   Result Value Ref Range    CD19+ B Cells % 1.3 (L) 6.4 - 22.0 % Lymphs    CD19+ B Cells 31 (L) 110 - 450 cells/uL    CD20+ % 95.2 (L) 96.0 - 100.0 % of CD19    CD20+ 30 (L) 110 - 450 cells/uL    Total Memory CD27+ % 17.1 10.0 - 33.0 % of CD19    Total Memory CD27+ 5 (L) 23 - 110 cells/uL    Non Switched CD27+IgD+IgM+ % 6.5 2.4 - 15.0 % of CD19    Non switched CD27+IgD+IgM 2 (L) 5 - 46 cells/uL    Class-switched CD27+IgD-IgM- % 9.4 5.1 - 22.0 % of CD19    Class-switched CD27+IgD-IgM- 3 (L) 11 - 61 cells/uL    Transitional CD38+IgM+ % 31.0 (H) 0.7 - 5.9 % of CD19    Transitional CD38+IgM+ 10 1 - 17 cells/uL    Plasmablasts Cd38+IgM- % 1.5 0.4 - 4.1 % of CD19    Plasmablasts CD38+IgM- 0 (L) 1 - 8 cells/uL    Activated CD21low CD38- % 3.6 1.2 - 9.0 % of CD19    Activated CD21low 38- 1 (L) 3 - 26 cells/uL     STRESS ECHO REPORT       --------------------------------------------------------------------------------  Patient Name:   [name] Date of Exam: 10/05/2019  Study Time:     2:30:38 PM     Site ID:      [place] Rec #:  [id number]      Account #:    [id number]  Accession#:     [id number]  Date of Birth:  May 21, 1960     Height:       64.0 in  Patient Age:    58 years      Weight:       135.0 lb  Patient Gender: F             BSA:          1.66 m       Procedure:   Stress echo complete.  Sonographer: Marc Morgans RDCS  Ref. Phys:   [place]       Exam Protocol: The patient exercised on a treadmill according to a Bruce protocol.  Indications:     Chest Pain  Risk Factors     Tobacco use.  Medications:     Beta blocker.  Cardiac History: None.       Resting Findings:  Supine BP 133 / 82   Standing BP 110 / 83   Resting HR 93 bpm Standing HR 98 bpm  mmHg                 mmHg    Patient Performance:  The patient exercised for 8 minutes and 59 seconds, achieving 10 METS. The  maximum stage achieved was III of the Bruce protocol.  The peak heart rate achieved was 142 bpm, which was 89 % of the predicted target  heart rate. The target heart rate was 159 bpm. Good exercise capacity for the  patients age.  The peak blood pressure during stress was 160 / 83 mmHg. The blood pressure  response to exercise was normal.  Stress was stopped due to maximal effort. The patient developed no symptoms  during the stress exam.       Wall Scoring:  Rest: All segments are normal.  Stress: All segments are normal.    EKG:  Resting EKG showed normal sinus rhythm with nonspecific ST-T wave changes.  There are non specific ST T wave changes seen during peak stress period.         Doppler Findings:                               REST      STRESS  LVOT pk velocity:  1.6 m/s   2.2 m/s  LVOT pk gradient: 9.61 mmHg 18.66 mmHg       Summary:   1. Negative stress echo for ischemia.   2. Good exercise capacity for the patients age.   3. No significant LVOT gradient with exercise (peak velocity 2.2 m/s).   4. Normal blood pressure response to exercise.    [id number] Eston Mould MD  Electronically signed  on 10/05/2019 at 6:46:34 PM    07/31/19 ECHO    Summary:   1. The left ventricular size is normal. The left ventricular systolic function is hyperdynamic.   2. Mild concentric left ventricular hypertrophy.   3. Mild or grade I (impaired relaxation pattern) left ventricular diastolic filling.   4. A dynamic LV outflow gradient is present 1.65 M/sec (11 mmHg) increasing to 2.4 M/sec (23 mmHg) with valsalva.   5. The right ventricular size is normal and systolic function is normal.   6. Mild pulmonary hypertension with right ventricular systolic pressure measuring 38 mmHg.   7. No previous study for comparison.    Results for NORIA, BUGLIONE (MRN [id number]) as of 05/16/2020 09:44   Ref. Range 04/02/2020 11:52   Sodium Latest Ref Range: 136 - 145 mmol/L 137   Potassium Latest Ref Range: 3.5 - 5.1 mmol/L 4.1   Chloride Latest Ref Range: 98 - 107 mmol/L 100   Bicarbonate Latest Ref Range: 22 - 29 mmol/L 27   Anion Gap Latest Ref Range: 7 - 15 mmol/L 10   BUN Latest Ref Range: 8 - 23 mg/dL 15   Creatinine Latest Ref Range: 0.51 - 0.95 mg/dL 0.75   GFR Latest Units: mL/min >60   Glucose Latest Ref Range: 70 - 99 mg/dL 94   Calcium Latest Ref Range: 8.5 - 10.6 mg/dL 9.9   Alkaline Phos Latest Ref Range: 35 - 140 U/L 54   ALT (SGPT) Latest Ref Range: 0 - 33 U/L 24  AST (SGOT) Latest Ref Range: 0 - 32 U/L 25   Bilirubin, Tot Latest Ref Range: <1.2 mg/dL 0.42   Albumin Latest Ref Range: 3.5 - 5.2 g/dL 4.6   Total Protein Latest Ref Range: 6.0 - 8.0 g/dL 7.5   Glycated Hemoglobin Latest Ref Range: 4.8 - 5.8 % 5.4   Triglycerides Latest Ref Range: 10 - 170 mg/dL 82   Cholesterol Latest Ref Range: <200 mg/dL 292 (H)   HDL-Cholesterol Latest Units: mg/dL 121   Non-HDL Cholesterol Latest Units: mg/dL 171   LDL-Chol (Calc) Latest Ref Range: <160 mg/dL 155   Free T4 Latest Ref Range: 0.93 - 1.70 ng/dL 0.80 (L)   TSH Latest Ref Range: 0.27 - 4.20 uIU/mL 0.60   CRP Latest Ref Range: <0.5 mg/dL <0.30   Folate Latest Units: ng/mL 14.1        ASSESSMENT & PLAN:  Bennye Nix is a 60 year old female was seen today for:  Inayah was seen today for forms.    Diagnoses and all orders for this visit:    Inadequate exercise - not at goal    Uveitis of right eye  Followed by ophthalmology  COVID-19 virus infection  Still has fatigue after recovering from covid infection    Multiple sclerosis (CMS-HCC)  Followed by neurology, last seen 04/08/20, getting rituximab infusions  Blood in stool  -     GI Endoscopy Procedure Service Request (Non-GI Use Only); Future  New order for colonoscopy placed due to having blood in stool  Administrative encounter  Form for disability filled out for patient, will be scanned into EMR and sent to patient  Mild left ventricular hypertrophy  Followed by cardiology, not taking beta blockers at this time due to fatigue and worsening fatigue symptoms      Health Maintenance   Topic Date Due    Shingles Vaccine (1 of 2) Never done    Medicare Annual Wellness Visit  Never done    Influenza (1) 11/18/2019    PHQ9 Depression Monitoring doc flowsheet  09/09/2020    Breast Cancer Screen  04/08/2021    Colorectal Cancer Screening  01/16/2022    Cervical Cancer Screening  01/19/2022    Tetanus (2 - Td or Tdap) 01/20/2027    Hepatitis C Screening  Completed    COVID-19 Vaccine  Completed    Polio Vaccine  Aged Out    HPV Vaccine <= 35 Yrs  Aged Out    Meningococcal MCV4 Vaccine  Aged Out    Pneumococcal Vaccine  Aged Out       Return in about 4 weeks (around 06/09/2020) for medicare wellness visit.    Patient Instructions   I filled out your paper work and will be sending to you to sign and send   I placed another order for your colonoscopy to be done        I personally spent 41 total minutes in face -to-face and non-face-to-face activities related to the patients visit today, excluding any separately reportable services/procedures.    Patient's questions answered.    Brandan Glauber, MD

## 2020-05-13 ENCOUNTER — Telehealth (INDEPENDENT_AMBULATORY_CARE_PROVIDER_SITE_OTHER): Payer: Medicare Other | Admitting: Psychiatric/Mental Health

## 2020-05-13 DIAGNOSIS — F419 Anxiety disorder, unspecified: Secondary | ICD-10-CM

## 2020-05-13 DIAGNOSIS — F515 Nightmare disorder: Secondary | ICD-10-CM

## 2020-05-13 DIAGNOSIS — F4310 Post-traumatic stress disorder, unspecified: Secondary | ICD-10-CM

## 2020-05-13 DIAGNOSIS — F339 Major depressive disorder, recurrent, unspecified: Secondary | ICD-10-CM

## 2020-05-13 MED ORDER — BUPROPION XL (DAILY) 300 MG OR TB24
300.0000 mg | ORAL_TABLET | Freq: Every morning | ORAL | 1 refills | Status: DC
Start: 2020-05-13 — End: 2020-10-09

## 2020-05-13 MED ORDER — TIZANIDINE HCL 4 MG OR TABS
4.0000 mg | ORAL_TABLET | Freq: Every evening | ORAL | 1 refills | Status: DC
Start: 2020-05-13 — End: 2021-04-29
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
## 2020-05-16 ENCOUNTER — Telehealth (HOSPITAL_BASED_OUTPATIENT_CLINIC_OR_DEPARTMENT_OTHER): Payer: Self-pay

## 2020-05-16 NOTE — Patient Instructions (Addendum)
I filled out your paper work and will be sending to you to sign and send   I placed another order for your colonoscopy to be done

## 2020-05-16 NOTE — Telephone Encounter (Signed)
Tried returning patient's call, had left a message on scheduling line. Requesting to schedule RITUXAN for our [place] office. Left a message to please call back.

## 2020-05-16 NOTE — Telephone Encounter (Signed)
Returned patient's call, had left a message on scheduling line. Requesting to schedule RITUXAN for our [place] office. We were able to make apt for 1/31.

## 2020-05-16 NOTE — Telephone Encounter (Signed)
Debra Bolton left a voice mail stating she is returning Debra Bolton call. Please call patient at [phone].

## 2020-05-19 ENCOUNTER — Ambulatory Visit
Admit: 2020-05-19 | Discharge: 2020-05-19 | Disposition: A | Discharge status: Home / Self Care | Payer: Medicare Other | Attending: Neurology | Admitting: Neurology

## 2020-05-19 ENCOUNTER — Telehealth (HOSPITAL_BASED_OUTPATIENT_CLINIC_OR_DEPARTMENT_OTHER): Payer: Self-pay

## 2020-05-19 NOTE — Telephone Encounter (Signed)
Returned patient's call, in regards to [facility] appointment. Made new appointment for Wednesday 2/02 for our [facility] office.

## 2020-05-19 NOTE — Telephone Encounter (Signed)
Patient calling in again, schedulers are currently unavailable.  Patient is requesting to r/s for tomorrow or the next day if possible.  She states she had to cancel today due to transportation.

## 2020-05-19 NOTE — Telephone Encounter (Signed)
Patient called to r/s her infusion center appointment from this morning.    Request forwarded to Scheduling Coordinators.  Patient's phone: [phone]

## 2020-05-20 NOTE — Telephone Encounter (Signed)
Patient informed form ready for pick up left up front under K.   Not sure if social security needs to be filled out advised patient to fax it herself since that's sensitive information left in voicemail.  Copy sent to scanning, and another copy given to nurse Iris to be kept at her desk incase lost during scanning.

## 2020-05-21 ENCOUNTER — Ambulatory Visit
Admission: RE | Admit: 2020-05-21 | Discharge: 2020-05-22 | Disposition: A | Discharge status: Home / Self Care | Payer: Medicare Other | Attending: Internal Medicine | Admitting: Internal Medicine

## 2020-05-21 VITALS — BP 114/80 | HR 90 | Temp 97.1°F | Resp 16 | Ht 64.0 in | Wt 144.2 lb

## 2020-05-21 DIAGNOSIS — G35 Multiple sclerosis: Secondary | ICD-10-CM | POA: Insufficient documentation

## 2020-05-21 DIAGNOSIS — H209 Unspecified iridocyclitis: Secondary | ICD-10-CM

## 2020-05-21 DIAGNOSIS — Z5112 Encounter for antineoplastic immunotherapy: Secondary | ICD-10-CM

## 2020-05-21 DIAGNOSIS — D8989 Other specified disorders involving the immune mechanism, not elsewhere classified: Secondary | ICD-10-CM | POA: Insufficient documentation

## 2020-05-21 LAB — COMPREHENSIVE METABOLIC PANEL, BLOOD
ALT (SGPT): 23 U/L (ref 0–33)
AST (SGOT): 31 U/L (ref 0–32)
Albumin: 4.4 g/dL (ref 3.5–5.2)
Alkaline Phos: 54 U/L (ref 35–140)
Anion Gap: 11 mmol/L (ref 7–15)
BUN: 10 mg/dL (ref 8–23)
Bicarbonate: 24 mmol/L (ref 22–29)
Bilirubin, Tot: 0.45 mg/dL (ref <1.2)
Calcium: 9.9 mg/dL (ref 8.5–10.6)
Chloride: 105 mmol/L (ref 98–107)
Creatinine: 0.78 mg/dL (ref 0.51–0.95)
GFR: >60 mL/min
Glucose: 78 mg/dL (ref 70–99)
Potassium: 4 mmol/L (ref 3.5–5.1)
Sodium: 140 mmol/L (ref 136–145)
Total Protein: 7.3 g/dL (ref 6.0–8.0)

## 2020-05-21 LAB — CBC WITH DIFF, BLOOD
ANC-Automated: 2.7 10*3/uL (ref 1.6–7.0)
ANC-Instrument: 2.7 10*3/uL (ref 1.6–7.0)
Abs Basophils: 0 10*3/uL (ref <0.1)
Abs Eosinophils: 0.4 10*3/uL (ref 0.0–0.5)
Abs Lymphs: 2.5 10*3/uL (ref 0.8–3.1)
Abs Monos: 0.7 10*3/uL (ref 0.2–0.8)
Basophils: 0 %
Eosinophils: 6 %
Hct: 37.5 % (ref 34.0–45.0)
Hgb: 12.2 gm/dL (ref 11.2–15.7)
Lymphocytes: 39 %
MCH: 30 pg (ref 26.0–32.0)
MCHC: 32.5 g/dL (ref 32.0–36.0)
MCV: 92.4 um3 (ref 79.0–95.0)
MPV: 9.3 fL — ABNORMAL LOW (ref 9.4–12.4)
Monocytes: 12 %
Plt Count: 224 10*3/uL (ref 140–370)
RBC: 4.06 10*6/uL (ref 3.90–5.20)
RDW: 12 % (ref 12.0–14.0)
Segs: 43 %
WBC: 6.3 10*3/uL (ref 4.0–10.0)

## 2020-05-21 MED ORDER — METHYLPREDNISOLONE SODIUM SUCC 125 MG IJ SOLR CUSTOM
100.0000 mg | Freq: Once | INTRAMUSCULAR | Status: AC
Start: 2020-05-21 — End: 2020-05-21
  Administered 2020-05-21 (×2): 100 mg via INTRAVENOUS
  Filled 2020-05-21: qty 125

## 2020-05-21 MED ORDER — SODIUM CHLORIDE 0.9 % IV SOLN
1000.0000 mg | Freq: Once | INTRAVENOUS | Status: AC
Start: 2020-05-21 — End: 2020-05-21
  Administered 2020-05-21: 1000 mg via INTRAVENOUS
  Filled 2020-05-21: qty 100

## 2020-05-21 MED ORDER — ACETAMINOPHEN 325 MG PO TABS
650.0000 mg | ORAL_TABLET | Freq: Once | ORAL | Status: AC
Start: 2020-05-21 — End: 2020-05-21
  Administered 2020-05-21 (×2): 650 mg via ORAL
  Filled 2020-05-21: qty 2

## 2020-05-21 MED ORDER — SODIUM CHLORIDE 0.9 % IV SOLN
INTRAVENOUS | Status: DC
Start: 2020-05-21 — End: 2020-05-22

## 2020-05-21 MED ORDER — DIPHENHYDRAMINE HCL 50 MG/ML IJ SOLN
25.0000 mg | Freq: Once | INTRAMUSCULAR | Status: AC
Start: 2020-05-21 — End: 2020-05-21
  Administered 2020-05-21: 25 mg via INTRAVENOUS
  Filled 2020-05-21: qty 1

## 2020-05-21 NOTE — Interdisciplinary (Signed)
Non-Chemotherapy Infusion Nursing Note - [place] is a 60 year old female who presents for infusion of Rituximab 1000 mg.    Vitals:    05/21/20 0840   BP: 130/81   BP Location: Left arm   BP Patient Position: Sitting   Pulse: 73   Resp: 16   Temp: 97.1 F (36.2 C)   TempSrc: Temporal   SpO2: 100%   Weight: 65.4 kg (144 lb 3 oz)   Height: 5\' 4"  (1.626 m)     Pain Score: 0  Body surface area is 1.72 meters squared.  Body mass index is 24.75 kg/m.    Pre-treatment nursing assessment:  No problems identified upon assessment.  Pt alert and oriented. Denies pain at this time. Denies n/v. Denies fever/chills/sob. Pt states she did not have cataract surgery recently due to it being cancelled because of COVID cases.    24 gauge peripheral Iv inserted to patient's let forearm. Positive blood return noted. Labs drawn peripherally. Flushed with NS and NS infusing.    Medications   sodium chloride 0.9% infusion (0 mL IntraVENOUS Completed 05/21/20 1302)   acetaminophen (TYLENOL) tablet 650 mg (650 mg Oral Given 05/21/20 0909)   diphenhydrAMINE (BENADRYL) injection 25 mg (25 mg IntraVENOUS Given 05/21/20 0909)   methylPREDNISolone sodium succinate (SOLU-MEDROL) injection 100 mg (100 mg IntraVENOUS Given 05/21/20 0909)   riTUXimab (RITUXAN) 1,000 mg in sodium chloride 0.9 % 1,000 mL infusion (0 mg IntraVENOUS Completed 05/21/20 1300)     Debra Bolton tolerated treatment well.    Post blood return: Brisk  Post-Flush: NS and Discontinued IV    Patient Education  Learner: Patient  Barriers to learning: No Barriers  Readiness to learn: Acceptance  Method: Explanation    Treatment Education: Information/teaching given to patient including: signs and symptoms of infection, bleeding, adverse reaction(s), symptom control, and when to notify MD.    [name]: Instructed patient to call for assistance.    Pain Education: Patient instructed to contact nurse if pain should develop or if their  current pain therapy becomes ineffective.    Response: Verbalizes understanding    Discharge Plan  Discharge instructions given to patient.  Future appointments given and reviewed with treatment plan.  Discharge Mode: Ambulatory  Discharge Time: [place] by: Self  Discharged To: Home

## 2020-05-23 ENCOUNTER — Telehealth (INDEPENDENT_AMBULATORY_CARE_PROVIDER_SITE_OTHER): Payer: Self-pay | Admitting: Family Practice

## 2020-05-23 NOTE — Telephone Encounter (Signed)
Spoke to patient she advised that she needs everything to be faxed. Advised patient that I will fax today and to call office to let them know that it has been faxed. No further action required will close encounter.

## 2020-05-23 NOTE — Telephone Encounter (Signed)
Who is calling: Incoming call from patient  Insurance Coverage Verified: Active- in network  Reason for this call: Patient received a voice mail from the office in regards to her forms that were needing to be faxed. Patient is requesting Maudie Mercury to contact her so that the patient can go into detail on what needs to be done within those forms. Please review and contact the patient.     Action required by office: Please contact caller    Duplicate encounter? encounter on 05/20/2020 by Agustin Cree way to contact: [phone]    Inquiry has been read verbatim to this caller. Verbalizes satisfaction and confirms the above is accurate: yes      Has been advised this message will be transmitted to office and can expect a response within the next 24-72 hours.

## 2020-06-03 DIAGNOSIS — G894 Chronic pain syndrome: Secondary | ICD-10-CM | POA: Diagnosis not present

## 2020-06-03 DIAGNOSIS — M48061 Spinal stenosis, lumbar region without neurogenic claudication: Secondary | ICD-10-CM | POA: Diagnosis not present

## 2020-06-03 DIAGNOSIS — Z1389 Encounter for screening for other disorder: Secondary | ICD-10-CM | POA: Diagnosis not present

## 2020-06-03 DIAGNOSIS — M461 Sacroiliitis, not elsewhere classified: Secondary | ICD-10-CM | POA: Diagnosis not present

## 2020-06-03 DIAGNOSIS — M5136 Other intervertebral disc degeneration, lumbar region: Secondary | ICD-10-CM | POA: Diagnosis not present

## 2020-06-03 DIAGNOSIS — M47816 Spondylosis without myelopathy or radiculopathy, lumbar region: Secondary | ICD-10-CM | POA: Diagnosis not present

## 2020-06-12 ENCOUNTER — Encounter: Payer: Self-pay | Admitting: Hospital

## 2020-06-12 ENCOUNTER — Other Ambulatory Visit: Payer: Self-pay

## 2020-06-12 NOTE — Telephone Encounter (Signed)
Health Risk Assessment Questionnaire: Care Navigator sent my-chart message and left voicemail regarding completing the Health Risk Assessment questionnaire for upcoming Medicare Annual Wellness Visit.

## 2020-06-13 ENCOUNTER — Other Ambulatory Visit: Payer: Self-pay

## 2020-06-16 ENCOUNTER — Encounter (INDEPENDENT_AMBULATORY_CARE_PROVIDER_SITE_OTHER): Payer: Self-pay | Admitting: Family Practice

## 2020-06-16 ENCOUNTER — Telehealth (INDEPENDENT_AMBULATORY_CARE_PROVIDER_SITE_OTHER): Payer: Medicare Other | Admitting: Family Practice

## 2020-06-16 ENCOUNTER — Other Ambulatory Visit: Payer: Self-pay

## 2020-06-16 ENCOUNTER — Telehealth: Payer: Self-pay

## 2020-06-16 ENCOUNTER — Ambulatory Visit (INDEPENDENT_AMBULATORY_CARE_PROVIDER_SITE_OTHER): Payer: Medicare HMO | Admitting: Legal Medicine

## 2020-06-16 ENCOUNTER — Encounter: Payer: Self-pay | Admitting: Legal Medicine

## 2020-06-16 VITALS — BP 140/58 | HR 55 | Temp 97.5°F | Resp 16 | Ht 63.0 in | Wt 96.0 lb

## 2020-06-16 DIAGNOSIS — E782 Mixed hyperlipidemia: Secondary | ICD-10-CM

## 2020-06-16 DIAGNOSIS — F17219 Nicotine dependence, cigarettes, with unspecified nicotine-induced disorders: Secondary | ICD-10-CM | POA: Diagnosis not present

## 2020-06-16 DIAGNOSIS — G894 Chronic pain syndrome: Secondary | ICD-10-CM | POA: Diagnosis not present

## 2020-06-16 DIAGNOSIS — E440 Moderate protein-calorie malnutrition: Secondary | ICD-10-CM

## 2020-06-16 DIAGNOSIS — J449 Chronic obstructive pulmonary disease, unspecified: Secondary | ICD-10-CM

## 2020-06-16 DIAGNOSIS — I10 Essential (primary) hypertension: Secondary | ICD-10-CM

## 2020-06-16 DIAGNOSIS — F321 Major depressive disorder, single episode, moderate: Secondary | ICD-10-CM | POA: Diagnosis not present

## 2020-06-16 DIAGNOSIS — M47816 Spondylosis without myelopathy or radiculopathy, lumbar region: Secondary | ICD-10-CM

## 2020-06-16 DIAGNOSIS — G8929 Other chronic pain: Secondary | ICD-10-CM

## 2020-06-16 DIAGNOSIS — R0602 Shortness of breath: Secondary | ICD-10-CM

## 2020-06-16 DIAGNOSIS — R2689 Other abnormalities of gait and mobility: Secondary | ICD-10-CM

## 2020-06-16 DIAGNOSIS — Z723 Lack of physical exercise: Secondary | ICD-10-CM

## 2020-06-16 DIAGNOSIS — Z1389 Encounter for screening for other disorder: Secondary | ICD-10-CM

## 2020-06-16 DIAGNOSIS — M25511 Pain in right shoulder: Secondary | ICD-10-CM

## 2020-06-16 DIAGNOSIS — Z1339 Encounter for screening examination for other mental health and behavioral disorders: Secondary | ICD-10-CM

## 2020-06-16 DIAGNOSIS — U099 Post covid-19 condition, unspecified: Secondary | ICD-10-CM

## 2020-06-16 DIAGNOSIS — R5382 Chronic fatigue, unspecified: Secondary | ICD-10-CM

## 2020-06-16 NOTE — Progress Notes (Signed)
---------------------(data below generated by Christin Bach, MD)--------------------     Patient Verification & Telemedicine Consent & Financial Waiver:    1.   Identity: I have verified this patient's identity to be accurate.  2.   Consent: I verify consent has been secured in one of the following methods: (a) obtained written/ online attestation consent (via MyChartVideoVisit pathway), (b) the spoke-side provider has obtained verbal or written consent from patient/surrogate (if this is a "provider to provider" evaluation), or (c) in all other cases, I have personally obtained verbal consent from the patient/ surrogate (noting all elements below) to perform this voluntary telemedicine evaluation (including obtaining history, performing examination and reviewing data provided by the patient).   The patient/ surrogate has the right to refuse this evaluation.  I have explained risks (including potential loss of confidentiality), benefits, alternatives, and the potential need for subsequent face to face care. Patient/ surrogate understands that there is a risk of medical inaccuracies given that our recommendations will be made based on reported data (and we must therefore assume this information is accurate).  Knowing that there is a risk that this information is not reported accurately, and that the telemedicine video, audio, or data feed may be incomplete, the patient agrees to proceed with evaluation and holds [place] harmless knowing these risks.  3.   Healthcare Team: The patient/ surrogate has been notified that other healthcare professionals (including students, residents and Metallurgist) may be involved in this audio-video evaluation.   All laws concerning confidentiality and patient access to medical records and copies of medical records apply to telemedicine.  4.   Privacy: If this is a Radiographer, therapeutic Visit, the patient/ surrogate has received the [place] Notice of Privacy Practices via E-Checkin process.  For  all other video visit techniques, I have verbally provided the patient/ surrogate with the [place] in [place] ([email or web address]) or Spanish ([email or web address]://[email or web address]).  The patient/ surrogate acknowledges both being provided the NPP link, and has been offered to have the NPP mailed to the patient/ surrogate by [place] mail.  The patient/ surrogate has voiced understanding an acknowledgement of receipt of this NPP web address.  If the patient/surrogate has elected to receive the NPP via [place] mail, I verify that the NPP will be sent promptly to the patient/surrogate via [place] mail.  5.   Capacity: I have reviewed this above verification and consent paragraph with the patient/ surrogate and the patient is capacitated or has a surrogate. If the patient is not capacitated to understand the above, and no surrogate is available, since this is not an emergency evaluation, the visit will be rescheduled until such time that the patient can consent, or the surrogate is available to consent. If this is an emergency evaluation and the patient is not capacitated to understand the above, and no surrogate is available, I am proceeding with this evaluation as this is felt to be an emergency setting and no appropriate specialist is available at the bedside to perform these evaluations.  6.   Financial Waiver: If this is a Radiographer, therapeutic Visit, the patient has been made aware of the financial waiver via E-Checkin process.  For all other video visit techniques, an E-Checkin process is not performed.  As such, I have personally verbally informed the patient/ surrogate that this evaluation will be a billable encounter similar to an in-person clinic visit, and the patient/ surrogate has agreed to pay the fee for services rendered.  If we are billing insurance for the  patient's telehealth visit, her out-of-pocket cost will be determined based on her plan and will be billed to her.  The  patient/ surrogate has also been informed that if the patient does not have insurance or does not wish to use insurance, [place] [employer] price for a primary care telehealth visit is $59.00 and specialist telehealth visit is $88.00.  I have further informed the patient/ surrogate that in the event the patient has additional services provided in conjunction with the specialty visit (Ex. Psychotherapy services), those services will be billed at the current rate less a 45% discount.  7.   Intra-State Location: The patient/ surrogate attests to understanding that if the patient accesses these services from a location outside of [place], that the patient does so at the patient's own risk and initiative and that the patient is ultimately responsible for compliance with any laws or regulations associated with the patient's use.  8.   Specific Use:The patient/ surrogate understands that [place] makes no representation that materials or servicesdelivered via telecommunication services, or listed on telemedicine websites, are appropriate or available for use in any other location.           Demographics:  Medical Record #: [id number]  Date: June 16, 2020  Patient Name: Debra Bolton  DOB: November 02, 1960  Age: 60 year old  Sex: female  Location: Home address on file     Evaluator(s):  Debra Bolton was evaluated by me today.    Clinic Location: [facility]  [address] [place] [address]              Chief Complaint   Patient presents with    Medicare Annual Wellness, Return     History of present illness: Debra Bolton is a 60 year old female who presents to this clinic for her Medicare annual wellness visit. This is the patient's   Subsequent visit.    SUBJECTIVE:     She is doing ok  No longer needs nortriptyline  Needs to reschedule colonoscopy    Would like referral for PT for balance and right shoulder, has history of torn rotator cuff  PT in  [place], looked up [facility] [phone]  [address], [place], [place] [address]    Balance issue- due to [place], has been falling  She fell again  Balance is worse when walking on uneven ground, if has to walk on sand; or walking up and down steps will not have the same balance  She fell again, 2 weeks before superbowl  Twisted her knee, so had some swelling on knee now getting better  She was walking home from watching a football game  It was uneven, the curb was going downhill, she had to take a step down  Missed a step and fell  She fell on her left knee and both hands and twisted her leg  No head trauma  No hand pain  Knee pain is getting better  [address] on curb onto grass and knee hit the pavement  She bruised thigh and back shin; now almost gone    She is able to walk  She could walk the whole time    There was some sweling of knee, she iced and used RICE; it is getting better    Daughter is having a baby, she is going to [place] going to live there for 6 months,  leaving April 15th; she is due May 1; staying [facility] nearby      She is in a lot of pain  She is MS pain  Mainly in her legs and back and feels like glass shards  She has been doing everything right, eating right and sleeping right  She is doing everything that is important    She had infusion a month ago, usually that helps but has not helped  Not sure if having a little bit of a relapse or consistently getting worse    She missed a few days of nortripyline, might have made her pain worse  Duloxetine 120mg  bedtime, gabapentin 600mg  at bedtime, nortriptyline 50mg  at bedtime    She has body aches and fatigue  Depression area makes her more depressed  covid made worse    She is on anti inflammatory diet  No gluten  No dairy  Mostly fruit and vegetables    She still feels like she is 10 pounds overweight for her    When she saw neurologist, to give 6 months if not good, could see a pulmonologist    Sometimes feel sob, when  walking up the steps, has been since she had Covid  Has not tried albuterol    Still has dizzy spells      Partner is pilot     Bunion on each side, more prominent on left foot  Not painful        Medical and family history: The patient's past medical history, surgical history, and family history were updated today within the electronic medical record.    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    Posterior polar cataract OD: congenital?      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    Multiple sclerosis (CMS-HCC) 12/10/2015       Past Medical History:   Diagnosis Date    Asthma 1991    with pregnancy    Hypothyroidism     Major depressive disorder, single episode     MS (multiple sclerosis) (CMS-HCC)        Past Surgical History:   Procedure Laterality Date    leep      for cervical dysplasia    LYMPH NODE BIOPSY      IgG4 related disease    TONSILLECTOMY AND ADENOIDECTOMY      only tonsillectomy       Family History   Problem Relation Name Age of Onset    Other Mother Zara Chess     Thyroid Mother Zara Chess     Heart Disease Mother Velva Harman mcclain         heart failure    Cancer Father don mc clain         lung  Cancer Brother tom mcclain     Heart Disease Brother tom mcclain         heart attack    Cancer Sister Velva Harman mcclain         breast     Diabetes Sister suzanne mcclain     Thyroid Sister suzanne mcclain         car accident    Stroke Brother Don mcclain     Cancer Brother Timmothy Sours mcclain         prostate cancer    Cancer Brother john mc clain         copd lungs    Glaucoma Neg Hx      Macular Degeneration (AMD) Neg Hx         Social history:  Social History     Socioeconomic History    Marital status: Divorced     Spouse name: Not on file    Number of children: Not on file    Years of education: Not on file    Highest education level: Not on file   Occupational History    Not on file   Tobacco Use    Smoking status: Former Smoker     Packs/day: 1.00     Years: 15.00     Pack years: 15.00     Types: Cigarettes     Quit date: 01/27/2016     Years since quitting: 4.3    Smokeless tobacco: Never Used   Substance and Sexual Activity    Alcohol use: Yes     Alcohol/week: 2.0 standard drinks     Types: 2 Glasses of wine per week    Drug use: Never    Sexual activity: Yes     Partners: Male     Birth control/protection: Rhythm   Social Activities of Daily Living Present    Military Service No    Blood Transfusions No    Caffeine Concern No    Occupational Exposure No    Hobby Hazards No    Sleep Concern No    Stress Concern No    Weight Concern No    Special Diet No    Back Care No    Exercise Not Asked    Bike Helmet Not Asked    Seat Belt Not Asked    Self-Exams Not Asked    Exercises Regularly Yes    Bike Helmet Use Yes    Seat Belt Use Yes    Performs Self-Exams Yes   Social History Narrative    Moved from [place] is great, high fiber, she is very conscientious of effect of diet on health.    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    Hasn't had a cigarette in 8 months.    Has smoked 26 years, less than a pack a day.    Currently drinks 2-4 drinks a day on the weekends, total 4-8 per week.    Sexually active with one female partner.    Currently not working, has been on disability for most of the time from 1992 to now.      Alcohol Use: Not At Risk    Frequency of Alcohol Consumption: 2-4 times a month    Average Number of Drinks: 1 or 2    Frequency of Binge Drinking: Never       Smoking intervention(s): not applicable (patient does not smoke).    Allergies:  Allergies   Allergen Reactions  Penicillins Rash    Modafinil Diarrhea and Nausea and Vomiting       Current Outpatient Medications   Medication Sig    albuterol (VENTOLIN HFA) 108 (90 Base) MCG/ACT inhaler Inhale 2 puffs by mouth every 6 hours as needed for Wheezing.    Amantadine HCl 100 MG tablet Take 100 mg by mouth 2 times daily. Takes PRN    baclofen (LIORESAL) 10 MG tablet TAKE 1 TABLET BY MOUTH EVERY DAY NIGHTLY    buPROPion (WELLBUTRIN) 300 MG Extended-Release tablet Take 1 tablet (300 mg) by mouth every morning.    cyclopentolate (CYCLOGYL) 1 % ophthalmic solution Place 1 drop into right eye 3 times daily.    diazepam (VALIUM) 5 MG tablet Take 1 tablet (5 mg) by mouth daily as needed for Anxiety.    DULoxetine (CYMBALTA) 60 MG CR capsule Take 2 capsules (120 mg) by mouth daily.    gabapentin (NEURONTIN) 300 MG capsule Take 2 capsules (600 mg) by mouth nightly.    nortriptyline (PAMELOR) 25 MG capsule Take 2 capsules (50 mg) by mouth nightly.    RITUXIMAB IV Every 6 months.    thyroid (ARMOUR THYROID) 15 MG tablet Take 1 tablet (15 mg) by mouth every other day.    thyroid (ARMOUR THYROID) 60 MG tablet Take 1 tablet (60 mg) by mouth daily.    tiZANidine (ZANAFLEX) 4 MG tablet Take 1 tablet (4 mg) by mouth at bedtime.     No current facility-administered medications for this visit.          Last reviewed on 06/17/2020  9:35 AM by Jacari Iannello, Verl Blalock, MD      Opioid Use Assessment:  Pursuant to Health and Safety Code section 11165.4(e), beginning Oct. 2, 1610, all DEA-licensed prescribers must consult CURES before prescribing a Schedule II, III or IV controlled substance.  Is the patient on opioids: No    Diet:  Do you eat five or more servings of fruits and  vegetables a day? Yes  Physical Activity:  On average, How many days a week do you engage in moderate to strenuous exercise: 5  On average, how many minutes per session do you engage in exercise at this level?: 20  Do you have pain that interferes with performing desired activites? (!) Yes    Level of functional ability:  Activities of daily living:  Basic: Do you need help eating, bathing, dressing, or getting around in your home?: No    Instrumental:   Do you need help using a telephone?: No   Can you shop for groceries or clothes without help?: Yes  Can you get places out of walking distance without help? For example can you travel alone by bus, taxi, or drive your own car?: Yes  Can you do your own housework without help? (!) No   She can do what needs to be done, she is able to get own house work  Do you ever have problems remembering to take any of your chronic medications? No  Can you handle your own money without help?  No data recorded  No issues handling money   Level of safety:  Home Safety:   Do you have a working smoke alarm in your home?: Yes  Does your home have loose rugs in the hallway?: No  Does your home have adequate lighting?: Yes  Does your home have grab bars in the bathroom?: (!) No she does not have grab bars, she has a seat, she is very  careful  Does your home have handrails on the stairs?: N/A no stairs  What is your living arrangement? No data recordedlives with partner  In the past 6 months, have you experienced leaking of urine?: (!) Yes   Leakage of urine, she thinks has spastic bladder, sometimes goes a little bit, from [place]  Do you have difficulty hearing? No  Do you feel that a vision difficulty limits your personal life? (!) Yes  Right eye is cataract that burst, like looking through wax paper, left eye is fine  They were going to surgery, needs to see if can get procedure  She is not driving   Fall risk: Fall within 6 months; Dizzy, lightheaded, unsteady gait     Dementia  Screening  Problems with judgment (e.g., problems making decisions, bad financial decisions, problems with thinking) N/A, Don't know no   Less interest in hobbies/activities N/A, Don't know no   Repeats the same things over and over (questions, stories, or statements) Yes, A Change  Kids tell her she repeats herself, doesn't not think so herself   Trouble learning how to use a tool, appliance, or gadget (e.g., VCR, computer, microwave, remote control) N/A, Don't know no   Forgets correct month or year N/A, Don't know no   Trouble handling complicated financial affairs (e.g., balancing checkbook, income taxes, paying bills) N/A, Don't know no   Trouble remembering appointments N/A, Don't know no   Daily problems with thinking and/or memory N/A, Don't know cognitive issues getting out of head from MS   Total AD8 Score 1   0 - 1: Normal cognition    Depression Evaluation:  During the past month, have you been bothered by feeling down, depressed or hopeless? Several days  During the past month, have you been bothered by little interest or pleasure in doing things? Several days  PHQ Score: 4    A lot of it related to pain she is having, she is trying to keep scheduled to do things  When she is hurting or sick or tired, gets overwhelming  Partner is supportive  She is deciding to back to counselor, she has someone she can work with right now    Social/Emotional Support:  Do you usually get the social and emotional support that you need?: Yes    Health Risk Assessment (HRA) reviewed in its entirety with patient face-to-face during visit. HRA Reviewed with patient. Discussed all abnormal results with patient.     Vitals recorded in today's visit:  There were no vitals taken for this visit.  Obesity intervention(s): dietary counseling and exercise counseling.  Hearing and Vision Screen:  No exam data present    The ASCVD Risk score Mikey Bussing DC Jr., et al., 2013) failed to calculate for the following reasons:    The valid HDL  cholesterol range is 20 to 100 mg/dL    Current providers and suppliers providing medical care to patient:  Patient Care Team:  Rylynn Kobs, Verl Blalock, MD as PCP - General (Family Practice)  Lakeria Starkman, Verl Blalock, MD as PCP - [facility] Assigned PCP  Arelia Sneddon, MD as Attending Provider (Neurology)  Johny Shears, MD as Attending Provider (Rheumatology)  Al Minta Balsam, MD as Attending Provider (Interventional Cardiology)  Additional external providers:     Advance directives No data recorded  Reviewed today: patient unable to provide at this time, will provide later  POLST: will review at a later date      Advanced care  directive- needs one  Not sure if daughter vs partner would be decision maker      A/P:        Problem List Items Addressed This Visit        Other    Inadequate exercise - not at goal      Other Visit Diagnoses     Short of breath on exertion    -  Primary has been ongoing since covid infection, will check chest xray, pfts and trial use of albuterol as needed    Relevant Orders    X-Ray Chest Frontal And Lateral    Complete PFT    Screened negative for significant alcohol use        Screened negative for drug use        COVID-19 long hauler manifesting chronic fatigue        Relevant Orders    X-Ray Chest Frontal And Lateral    Complete PFT    Balance problem        Relevant Orders    Consult/Referral to Other Clinic    Chronic right shoulder pain        Relevant Orders    Consult/Referral to Other Clinic          I have performed a comprehensive assessment of risk factors appropriate to pt's age, reviewed and reconciled all current medications and supplements, and counseled on anticipatory guidance and risk factor reduction. Upcoming health maintenance schedule reviewed face to face and a written copy was provided on today's after visit summary.    [place] Maintenance   Topic Date Due    Shingles Vaccine (1 of 2) Never done    Influenza (1) 11/18/2019    PHQ9 Depression Monitoring doc  flowsheet  10/14/2020    Breast Cancer Screen  04/08/2021    Medicare Annual Wellness Visit  06/16/2021    Colorectal Cancer Screening  01/16/2022    Cervical Cancer Screening  01/19/2022    Tetanus (2 - Td or Tdap) 01/20/2027    Hepatitis C Screening  Completed    COVID-19 Vaccine  Completed    Polio Vaccine  Aged Out    HPV Vaccine <= 65 Yrs  Aged Out    Meningococcal MCV4 Vaccine  Aged Out    Pneumococcal Vaccine  Aged Out       Follow up in 1 year for subsequent annual wellness visit.  Return in about 2 months (around 08/14/2020) for f/u before going to [place].    Patient Instruction:  See Patient Education/Instruction section.      Electronically signed by:  Christin Bach, MD

## 2020-06-16 NOTE — Patient Instructions (Signed)
Please get the chest xray and pulmonary function testing  I placed a referral to physical therapy for you  Follow up with me in April before you leave for [facility]
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
## 2020-06-16 NOTE — Telephone Encounter (Signed)
CCM referral 

## 2020-06-17 ENCOUNTER — Encounter (HOSPITAL_COMMUNITY): Payer: Self-pay | Admitting: Hospital

## 2020-06-17 ENCOUNTER — Telehealth: Payer: Self-pay | Admitting: Legal Medicine

## 2020-06-17 LAB — CBC WITH DIFFERENTIAL/PLATELET
Basophils Absolute: 0 10*3/uL (ref 0.0–0.2)
Basos: 0 %
EOS (ABSOLUTE): 0.1 10*3/uL (ref 0.0–0.4)
Eos: 1 %
Hematocrit: 43.7 % (ref 34.0–46.6)
Hemoglobin: 14.8 g/dL (ref 11.1–15.9)
Immature Grans (Abs): 0 10*3/uL (ref 0.0–0.1)
Immature Granulocytes: 0 %
Lymphocytes Absolute: 2 10*3/uL (ref 0.7–3.1)
Lymphs: 25 %
MCH: 32.4 pg (ref 26.6–33.0)
MCHC: 33.9 g/dL (ref 31.5–35.7)
MCV: 96 fL (ref 79–97)
Monocytes Absolute: 0.5 10*3/uL (ref 0.1–0.9)
Monocytes: 6 %
Neutrophils Absolute: 5.3 10*3/uL (ref 1.4–7.0)
Neutrophils: 68 %
Platelets: 214 10*3/uL (ref 150–450)
RBC: 4.57 x10E6/uL (ref 3.77–5.28)
RDW: 11.8 % (ref 11.7–15.4)
WBC: 8 10*3/uL (ref 3.4–10.8)

## 2020-06-17 LAB — LIPID PANEL
Chol/HDL Ratio: 2.3 ratio (ref 0.0–4.4)
Cholesterol, Total: 135 mg/dL (ref 100–199)
HDL: 59 mg/dL (ref >39)
LDL Chol Calc (NIH): 55 mg/dL (ref 0–99)
Triglycerides: 122 mg/dL (ref 0–149)
VLDL Cholesterol Cal: 21 mg/dL (ref 5–40)

## 2020-06-17 LAB — COMPREHENSIVE METABOLIC PANEL
ALT: 19 IU/L (ref 0–32)
AST: 24 IU/L (ref 0–40)
Albumin/Globulin Ratio: 1.9 (ref 1.2–2.2)
Albumin: 4.6 g/dL (ref 3.8–4.8)
Alkaline Phosphatase: 94 IU/L (ref 44–121)
BUN/Creatinine Ratio: 24 (ref 12–28)
BUN: 15 mg/dL (ref 8–27)
Bilirubin Total: 0.4 mg/dL (ref 0.0–1.2)
CO2: 23 mmol/L (ref 20–29)
Calcium: 9.7 mg/dL (ref 8.7–10.3)
Chloride: 106 mmol/L (ref 96–106)
Creatinine, Ser: 0.62 mg/dL (ref 0.57–1.00)
Globulin, Total: 2.4 g/dL (ref 1.5–4.5)
Glucose: 91 mg/dL (ref 65–99)
Potassium: 4 mmol/L (ref 3.5–5.2)
Sodium: 146 mmol/L — ABNORMAL HIGH (ref 134–144)
Total Protein: 7 g/dL (ref 6.0–8.5)
eGFR: 101 mL/min/{1.73_m2} (ref >59)

## 2020-06-17 LAB — CARDIOVASCULAR RISK ASSESSMENT

## 2020-06-17 NOTE — Progress Notes (Signed)
CBC normal, kidney and liver tests normal, Cholesterol normal,  lp

## 2020-06-17 NOTE — Progress Notes (Signed)
  Chronic Care Management   Outreach Note  06/17/2020 Name: BRITTNE KAWASAKI MRN: [id number] DOB: 14-Dec-1960  Referred by: Abigail Miyamoto, MD Reason for referral : Chronic Care Management   An unsuccessful telephone outreach was attempted today. The patient was referred to the pharmacist for assistance with care management and care coordination.   Follow Up Plan:   Aggie Hacker  Upstream Scheduler

## 2020-06-23 ENCOUNTER — Telehealth: Payer: Self-pay | Admitting: Legal Medicine

## 2020-06-23 NOTE — Chronic Care Management (AMB) (Signed)
  Chronic Care Management   Note  06/23/2020 Name: KYOMI HECTOR MRN: [id number] DOB: 05/16/1960  MADALYN LEGNER is a 60 y.o. year old female who is a primary care patient of Abigail Miyamoto, MD. I reached out to Christell Constant by phone today in response to a referral sent by Ms. Leonia Reader Nyman's PCP, Abigail Miyamoto, MD.   Ms. Sinagra was given information about Chronic Care Management services today including:  1. CCM service includes personalized support from designated clinical staff supervised by her physician, including individualized plan of care and coordination with other care providers 2. 24/7 contact phone numbers for assistance for urgent and routine care needs. 3. Service will only be billed when office clinical staff spend 20 minutes or more in a month to coordinate care. 4. Only one practitioner may furnish and bill the service in a calendar month. 5. The patient may stop CCM services at any time (effective at the end of the month) by phone call to the office staff.   Patient agreed to services and verbal consent obtained.   Follow up plan:   Aggie Hacker  Upstream Scheduler

## 2020-06-25 ENCOUNTER — Telehealth (INDEPENDENT_AMBULATORY_CARE_PROVIDER_SITE_OTHER): Payer: Self-pay | Admitting: Gastroenterology

## 2020-06-25 ENCOUNTER — Encounter (INDEPENDENT_AMBULATORY_CARE_PROVIDER_SITE_OTHER): Payer: Self-pay | Admitting: Ophthalmology

## 2020-06-25 ENCOUNTER — Encounter (INDEPENDENT_AMBULATORY_CARE_PROVIDER_SITE_OTHER): Payer: Self-pay | Admitting: Family Practice

## 2020-06-25 DIAGNOSIS — G8929 Other chronic pain: Secondary | ICD-10-CM

## 2020-06-25 DIAGNOSIS — M25511 Pain in right shoulder: Secondary | ICD-10-CM

## 2020-06-25 NOTE — Telephone Encounter (Signed)
Received call from patient requesting to schedule a colonoscopy  Per documentation from 09/03/2020:"The patient must complete a stress echo first which is currently scheduled for 09/12/2019. When the patient calls back please assist further. Thank you."    Connected with RN Luanna Salk to advise if this patient is safe to go to procedure.   Per RN patient had a stress echo test and results came back good. Patient has been scheduled.     FYI- Patient stated that she has the bowel prep and if she has any issues with that she will call us.   Went over prep and placed letter in patients MyChart This is for documentation purposes, thank you.

## 2020-06-26 NOTE — Telephone Encounter (Signed)
From: [name]  To: Christin Bach, MD  Sent: 06/25/2020 4:39 PM PST  Subject: pain and physical therapy    Dear Dr. Rana Snare,  I scheduled physical therapy at the place noted on our last visit, they accept medicare but not medicaid, so there will be a copay, but I am happy to pay that if I can improve my balance and help my shoulder. I printed the result from the shoulder injury, but I am not sure that is all they need, do you need to send them a referral ? My first appt. is on the 15th.    I also made appts for all the tests I need to have, and they are for the next 3 months, but they will get done.  I am still at a high level of pain growing from a 3 to an 8 by the days end, I don't think it was only the nortriptyline and I was thinking of upping the gabapentin from 2 capsule (300 mg. each) to 3 capsules. So 600 to 900? Do you think that may work?    Lastly I will be going to [facility] April 20th-May 29th, and then we will see if I am even needed, but I will be coming in and out of [place], so whatever we need to do for appts. can be done.    Thanks for helping me stay well.  [name]

## 2020-06-30 NOTE — Telephone Encounter (Signed)
Referral signed.

## 2020-07-01 DIAGNOSIS — G894 Chronic pain syndrome: Secondary | ICD-10-CM | POA: Diagnosis not present

## 2020-07-01 DIAGNOSIS — M461 Sacroiliitis, not elsewhere classified: Secondary | ICD-10-CM | POA: Diagnosis not present

## 2020-07-01 DIAGNOSIS — M47816 Spondylosis without myelopathy or radiculopathy, lumbar region: Secondary | ICD-10-CM | POA: Diagnosis not present

## 2020-07-01 DIAGNOSIS — Z1389 Encounter for screening for other disorder: Secondary | ICD-10-CM | POA: Diagnosis not present

## 2020-07-01 DIAGNOSIS — M48061 Spinal stenosis, lumbar region without neurogenic claudication: Secondary | ICD-10-CM | POA: Diagnosis not present

## 2020-07-01 DIAGNOSIS — M5136 Other intervertebral disc degeneration, lumbar region: Secondary | ICD-10-CM | POA: Diagnosis not present

## 2020-07-02 ENCOUNTER — Other Ambulatory Visit: Payer: Self-pay | Admitting: Legal Medicine

## 2020-07-03 DIAGNOSIS — M545 Low back pain, unspecified: Secondary | ICD-10-CM | POA: Diagnosis not present

## 2020-07-17 ENCOUNTER — Other Ambulatory Visit (INDEPENDENT_AMBULATORY_CARE_PROVIDER_SITE_OTHER): Payer: Self-pay | Admitting: Family Practice

## 2020-07-17 DIAGNOSIS — E039 Hypothyroidism, unspecified: Secondary | ICD-10-CM

## 2020-07-17 NOTE — Telephone Encounter (Addendum)
Med sync 60 MG       *The requested med passed all protocol parameters in the associated med info guide - Protocol details reviewed by pharmacist.       Debra Bolton, CPhT  ([facility])      Hypothyroid Medication Refill Protocol    Last visit in enc specialty: 06/16/2020     Recent Visits in This Encounter Department     Date Provider Department Visit Type Primary Dx    06/16/2020 Edi, Verl Blalock, MD [place] [facility] Short of breath on exertion    05/12/2020 Edi, Verl Blalock, MD [place] [facility] Inadequate exercise - not at goal    03/28/2020 Edi, Verl Blalock, MD [place] [facility] Encounter for screening mammogram for malignant neoplasm of breast    12/19/2019 Blain Pais, MD [place] [facility] COVID-19 virus infection    12/14/2019 Vinnie Level, MD [place] [facility] COVID-19 virus infection         Next f/u appt due:  2 month  Next appt in enc specialty: 07/23/2020      Future Appointments 07/18/2020 - 07/17/2025              Date Visit Type Department Provider     07/23/2020 11:20 AM RETURN PRIMARY EXTEND VIDEO [place] [facility] Edi, Verl Blalock, MD    Appointment Notes:     SOB return             07/24/2020 10:30 AM COMPLETE PFT W/ FULL [place] PFT LAB THHPFT2    Appointment Notes:     Short of breath on exertion [R06.02]COVID-19 long hauler manifesting chronic fatigue [R53.82, U09.9]             09/11/2020  1:00 PM APC NP PHONE [place] [facility] [place]    Appointment Notes:     Colonocsopy 10/15/2020 @[facility]  [phone]             09/23/2020  1:40 PM CT HEART CALCIUM SCORE WITHOUT CONTRAST [facility] CT [place] CT SCANNER ROOM    Appointment Notes:     3/9/22Scheduled w/pt.- no prep/ check-in time/location.  No cough/fever. [facility]             09/23/2020  4:15 PM MRI BRAIN WO/W CONTRAST [facility] MRI [facility] MRI 1    Appointment Notes:     3/9/22Scheduled w/pt.- no prep/ check-in time/location. No cough/fever. [facility]             10/06/2020  8:45 AM [place] Al Minta Balsam, MD    Appointment Notes:     6 month f/u                     **If no recent dose changes, and last TSH (and T4 for Liothyronine only) normal/within date, no recent notes will be reviewed per Pharmacy Dept protocol**      LABS required:  (Q year TSH)  *If recent dose change: check TSH 2 months after starting new  dose.    Lab Results   Component Value Date    TSH 0.60 04/02/2020    FREET4 0.80 (L) 04/02/2020  Monitoring required:  (Q year BP, HR, Wt)   *If pt is pregnant, DEFER TO MD*    (BP range: VOPFYTWK=46-286  NOTRRNHAF=79-03)  Blood Pressure   05/21/20 114/80   04/07/20 118/82   04/02/20 113/78       (HR range: 55-110)   Pulse Readings from Last 3 Encounters:   05/21/20 90   04/07/20 75   04/02/20 80       Wt Readings from Last 3 Encounters:   05/21/20 65.4 kg (144 lb 3 oz)   04/02/20 61.3 kg (135 lb 2.3 oz)   08/10/19 61.3 kg (135 lb 2.3 oz)     ---------------------------------------------------------------------------------------------------------  The above technician note was evaluated by the pharmacist.  Pharmacist Assessment and Plan:      Pt seen 06/16/20 for mAW. TSH 04/02/20 stable. auth 90+2 until next labs TSH due.       Debra Bolton, PharmD, [facility]   Refill and Prior [place]  Phone:  [phone]  Ext:  [phone]

## 2020-07-18 MED ORDER — THYROID 15 MG OR TABS
15.00 mg | ORAL_TABLET | ORAL | 2 refills | Status: DC
Start: 2020-07-18 — End: 2021-03-09

## 2020-07-18 MED ORDER — THYROID 60 MG OR TABS
60.0000 mg | ORAL_TABLET | Freq: Every day | ORAL | 2 refills | Status: DC
Start: 2020-07-18 — End: 2021-03-09

## 2020-07-19 ENCOUNTER — Encounter (INDEPENDENT_AMBULATORY_CARE_PROVIDER_SITE_OTHER): Payer: Self-pay | Admitting: Ophthalmology

## 2020-07-19 NOTE — Progress Notes (Signed)
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    Posterior polar cataract OD: congenital?    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 Improved appearance today after having tapered off topical steroids since last visit, none for past few weeks with decreased symptoms. Not clearly active. Likely improved off steroids due to Rituxan, last was 6 months ago per patient and overdue now, recommend repeat per Neurology. High risk of vision loss from uncontrolled uveitis, agree with systemic treatment. Uveitis precautions emphasized. Recommendations discussed with outside providers.    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 High risk cataract surgery with uncertain visual potential although appears to have progressed and is contributing to worsened vision OD, symptomatic, patient interested in surgery although will be traveling soon, patient will contact [place] to schedule. OCT mac w/o edema ou previously.    Had COVID vaccine 11/2019 and hasn't felt well since (had Regeneron). VA improved with dilation, will do try of cyclopentolate.     Orders:  -     OCT, Retina - OU - Both Eyes -  -     Fundus Photos - OU - Both Eyes -  -     cyclopentolate (CYCLOGYL) 1 % ophthalmic solution

## 2020-07-21 ENCOUNTER — Encounter (INDEPENDENT_AMBULATORY_CARE_PROVIDER_SITE_OTHER): Payer: Self-pay | Admitting: Ophthalmology

## 2020-07-21 ENCOUNTER — Encounter (HOSPITAL_COMMUNITY): Payer: Self-pay | Admitting: Hospital

## 2020-07-22 ENCOUNTER — Other Ambulatory Visit (INDEPENDENT_AMBULATORY_CARE_PROVIDER_SITE_OTHER): Payer: Self-pay | Admitting: Family Practice

## 2020-07-22 DIAGNOSIS — R0602 Shortness of breath: Secondary | ICD-10-CM

## 2020-07-22 NOTE — Telephone Encounter (Addendum)
Tally Joe, CPhT  ([facility])      Bronchodilator Refill Protocol    Last visit in enc specialty: 06/16/2020     Recent Visits in This Encounter Department     Date Provider Department Visit Type Primary Dx    06/16/2020 Edi, Verl Blalock, MD [place] [facility] Telemedicine Short of breath on exertion    05/12/2020 Edi, Verl Blalock, MD [place] [facility] Inadequate exercise - not at goal    03/28/2020 Edi, Verl Blalock, MD [place] [facility] Encounter for screening mammogram for malignant neoplasm of breast    12/19/2019 Blain Pais, MD [place] [facility] COVID-19 virus infection    12/14/2019 Vinnie Level, MD [place] [facility] COVID-19 virus infection         Next f/u appt due: Follow up in 1 year for subsequent annual wellness visit.  Return in about 2 months (around 08/14/2020) for f/u before going to [place]  Next appt in enc specialty: 07/23/2020      Future Appointments 07/22/2020 - 07/21/2025              Date Visit Type Department Provider     07/23/2020 11:20 AM RETURN PRIMARY EXTEND VIDEO  [place] Edi, Verl Blalock, MD    Appointment Notes:     SOB return             07/24/2020 10:30 AM COMPLETE PFT W/ FULL [place] PFT LAB [id number]    Appointment Notes:     Short of breath on exertion [R06.02]COVID-19 long hauler manifesting chronic fatigue [R53.82, U09.9]             09/11/2020  1:00 PM APC NP PHONE [place] [facility] [place]    Appointment Notes:     Colonocsopy 10/15/2020 @[facility]  [phone]             09/23/2020  1:40 PM CT HEART CALCIUM SCORE WITHOUT CONTRAST [facility] CT [place] CT SCANNER ROOM    Appointment Notes:     3/9/22Scheduled w/pt.- no prep/ check-in time/location. No cough/fever. [facility]              09/23/2020  4:15 PM MRI BRAIN WO/W CONTRAST [facility] MRI [facility] MRI 1    Appointment Notes:     3/9/22Scheduled w/pt.- no prep/ check-in time/location. No cough/fever. [facility]             10/06/2020  8:45 AM [place] [facility]Special[facility] Al Minta Balsam, MD    Appointment Notes:     6 month f/u                 Per OV notes from 06/16/2020:  S: Sometimes feel sob, when walking up the steps, has been since she had Covid  Has not tried albuterol  A/P: Short of breath on exertion    -  Primary has been ongoing since covid infection, will check chest xray, pfts and trial use of albuterol as needed  Relevant Orders  X-Ray Chest Frontal And Lateral  Complete PFT      LABS required:  (None)    Monitoring required:  (None)      -----------------------------------------------------------------------------------------------------------    The above technician note was  evaluated by the pharmacist.  Pharmacist Assessment and Plan:     Pt using for post covid SOB on exertion. Will auth suff qty to allow pt to recover and will reassess continued use at nxt request.    Ambrose Pancoast, PharmD [facility]   Refill and Prior [place]  Phone:  [phone]  Ext:  [phone]

## 2020-07-23 ENCOUNTER — Telehealth (INDEPENDENT_AMBULATORY_CARE_PROVIDER_SITE_OTHER): Payer: Medicare Other | Admitting: Family Practice

## 2020-07-23 ENCOUNTER — Encounter (INDEPENDENT_AMBULATORY_CARE_PROVIDER_SITE_OTHER): Payer: Self-pay

## 2020-07-23 ENCOUNTER — Encounter (INDEPENDENT_AMBULATORY_CARE_PROVIDER_SITE_OTHER): Payer: Self-pay | Admitting: Family Practice

## 2020-07-23 ENCOUNTER — Other Ambulatory Visit: Payer: Self-pay

## 2020-07-23 DIAGNOSIS — G35 Multiple sclerosis: Secondary | ICD-10-CM

## 2020-07-23 DIAGNOSIS — R296 Repeated falls: Secondary | ICD-10-CM

## 2020-07-23 DIAGNOSIS — R5382 Chronic fatigue, unspecified: Secondary | ICD-10-CM

## 2020-07-23 DIAGNOSIS — R0602 Shortness of breath: Secondary | ICD-10-CM

## 2020-07-23 DIAGNOSIS — Z789 Other specified health status: Secondary | ICD-10-CM

## 2020-07-23 DIAGNOSIS — Z0000 Encounter for general adult medical examination without abnormal findings: Secondary | ICD-10-CM

## 2020-07-23 DIAGNOSIS — Z1339 Encounter for screening examination for other mental health and behavioral disorders: Secondary | ICD-10-CM

## 2020-07-23 DIAGNOSIS — Z1389 Encounter for screening for other disorder: Secondary | ICD-10-CM

## 2020-07-23 DIAGNOSIS — E039 Hypothyroidism, unspecified: Secondary | ICD-10-CM

## 2020-07-23 DIAGNOSIS — K625 Hemorrhage of anus and rectum: Secondary | ICD-10-CM

## 2020-07-23 DIAGNOSIS — U099 Post covid-19 condition, unspecified: Secondary | ICD-10-CM

## 2020-07-23 MED ORDER — ALBUTEROL SULFATE 108 (90 BASE) MCG/ACT IN AERS
2.00 | INHALATION_SPRAY | Freq: Four times a day (QID) | RESPIRATORY_TRACT | 1 refills | Status: AC | PRN
Start: 2020-07-23 — End: ?

## 2020-07-23 NOTE — Progress Notes (Signed)
FAMILY MEDICINE TELEMEDICINE PROGRESS NOTE    CC:    Chief Complaint   Patient presents with    Shortness of Breath     follow up     ---------------------(data below generated by Christin Bach, MD)--------------------     Patient Verification & Telemedicine Consent & Financial Waiver:    1.   Identity: I have verified this patient's identity to be accurate.  2.   Consent: I verify consent has been secured in one of the following methods: (a) obtained written/ online attestation consent (via MyChartVideoVisit pathway), (b) the spoke-side provider has obtained verbal or written consent from patient/surrogate (if this is a "provider to provider" evaluation), or (c) in all other cases, I have personally obtained verbal consent from the patient/ surrogate (noting all elements below) to perform this voluntary telemedicine evaluation (including obtaining history, performing examination and reviewing data provided by the patient).   The patient/ surrogate has the right to refuse this evaluation.  I have explained risks (including potential loss of confidentiality), benefits, alternatives, and the potential need for subsequent face to face care. Patient/ surrogate understands that there is a risk of medical inaccuracies given that our recommendations will be made based on reported data (and we must therefore assume this information is accurate).  Knowing that there is a risk that this information is not reported accurately, and that the telemedicine video, audio, or data feed may be incomplete, the patient agrees to proceed with evaluation and holds [place] harmless knowing these risks.  3.   Healthcare Team: The patient/ surrogate has been notified that other healthcare professionals (including students, residents and Metallurgist) may be involved in this audio-video evaluation.   All laws concerning confidentiality and patient access to medical records and copies of medical records apply to telemedicine.  4.   Privacy:  If this is a Radiographer, therapeutic Visit, the patient/ surrogate has received the [place] Notice of Privacy Practices via E-Checkin process.  For all other video visit techniques, I have verbally provided the patient/ surrogate with the [place] in [place] ([email or web address]) or Spanish ([email or web address]://[email or web address]://[email or web address]).  The patient/ surrogate acknowledges both being provided the NPP link, and has been offered to have the NPP mailed to the patient/ surrogate by [place] mail.  The patient/ surrogate has voiced understanding an acknowledgement of receipt of this NPP web address.  If the patient/surrogate has elected to receive the NPP via [place] mail, I verify that the NPP will be sent promptly to the patient/surrogate via [place] mail.  5.   Capacity: I have reviewed this above verification and consent paragraph with the patient/ surrogate and the patient is capacitated or has a surrogate. If the patient is not capacitated to understand the above, and no surrogate is available, since this is not an emergency evaluation, the visit will be rescheduled until such time that the patient can consent, or the surrogate is available to consent. If this is an emergency evaluation and the patient is not capacitated to understand the above, and no surrogate is available, I am proceeding with this evaluation as this is felt to be an emergency setting and no appropriate specialist is available at the bedside to perform these evaluations.  6.   Financial Waiver: If this is a Radiographer, therapeutic Visit, the patient has been made aware of the financial waiver via E-Checkin process.  For all other video visit techniques, an E-Checkin process is not performed.  As such, I have personally verbally informed the patient/ surrogate  that this evaluation will be a billable encounter similar to an in-person clinic visit, and the patient/ surrogate has agreed to pay the fee for services rendered.  If we are billing  insurance for the patient's telehealth visit, her out-of-pocket cost will be determined based on her plan and will be billed to her.  The patient/ surrogate has also been informed that if the patient does not have insurance or does not wish to use insurance, [place] [employer] price for a primary care telehealth visit is $59.00 and specialist telehealth visit is $88.00.  I have further informed the patient/ surrogate that in the event the patient has additional services provided in conjunction with the specialty visit (Ex. Psychotherapy services), those services will be billed at the current rate less a 45% discount.  7.   Intra-State Location: The patient/ surrogate attests to understanding that if the patient accesses these services from a location outside of [place], that the patient does so at the patient's own risk and initiative and that the patient is ultimately responsible for compliance with any laws or regulations associated with the patient's use.  8.   Specific Use:The patient/ surrogate understands that [facility] makes no representation that materials or servicesdelivered via telecommunication services, or listed on telemedicine websites, are appropriate or available for use in any other location.           Demographics:  Medical Record #: [id number]  Date: July 23, 2020  Patient Name: Debra Bolton  DOB: 1961-04-15  Age: 60 year old  Sex: female  Location: Home address on file     Evaluator(s):  Debra Bolton was evaluated by me today.    Clinic Location: [facility]  [address], [address] [place] [address]        SUBJECTIVE:    Debra Bolton is a 60 year old female who is being seen for the following issues:    HPI by Problem:     SOB with exertion- potentially related to prior Covid infection  Using albuterol, feels like helps, getting PFTs tomorrow  She feels like she is able to do more physical activity    Pain from MS- manageable but  uncomfortable, pain is about at 6 out of 10  She is going to do the xray  Everything else is set up except the eye surgery    Leaving for [place] on 20th for 6 weeks then all her appointments in June    Overall energy is getting better after Covid infection  Still has brain fog, feeling a little off sometimes    Brain fog still has, still pretty off sometimes    Balance- started physical therapy, feels like it has been helping, went to physical therapy 4 times, has another appointment, working with physical therapy to make her stronger and help with balance  No falls recently    Needs to get flu shot at clinic  Wants to know if should get booster covid vaccine, got last covid vaccine in December  Had Covid infection in August, thinks had Covid again in January    Mammogram done in December 2021    She is going to be in [place] off and on to visit her grandchild but will be in [place] for another 3 years      Review of Systems:   As per HPI     Patient Active Problem List   Diagnosis  Multiple sclerosis (CMS-HCC)    IgG4 related disease (CMS-HCC)    Uveitis of right eye    Posterior subcapsular age-related cataract, right eye    Mood disorder (CMS-HCC)    PTSD (post-traumatic stress disorder)    Adequate exercise, at or above goal    Rotator cuff tear    Dental caries, unspecified    Deposits (accretions) on teeth    Rectal bleeding    Inadequate exercise - not at goal       Outpatient Medications Prior to Visit   Medication Sig Dispense Refill    albuterol (VENTOLIN HFA) 108 (90 Base) MCG/ACT inhaler Inhale 2 puffs by mouth every 6 hours as needed for Wheezing. 3 each 0    Amantadine HCl 100 MG tablet Take 100 mg by mouth 2 times daily. Takes PRN 60 tablet 0    baclofen (LIORESAL) 10 MG tablet TAKE 1 TABLET BY MOUTH EVERY DAY NIGHTLY 30 tablet 11    buPROPion (WELLBUTRIN) 300 MG Extended-Release tablet Take 1 tablet (300 mg) by mouth every morning. 90 tablet 1    cyclopentolate (CYCLOGYL) 1 %  ophthalmic solution Place 1 drop into right eye 3 times daily. 1 each 1    diazepam (VALIUM) 5 MG tablet Take 1 tablet (5 mg) by mouth daily as needed for Anxiety. 7 tablet 0    DULoxetine (CYMBALTA) 60 MG CR capsule Take 2 capsules (120 mg) by mouth daily. 180 capsule 11    gabapentin (NEURONTIN) 300 MG capsule Take 2 capsules (600 mg) by mouth nightly. 60 capsule 5    nortriptyline (PAMELOR) 25 MG capsule Take 2 capsules (50 mg) by mouth nightly. 180 capsule 3    RITUXIMAB IV Every 6 months.      thyroid (ARMOUR THYROID) 15 MG tablet Take 1 tablet (15 mg) by mouth every other day. 45 tablet 2    thyroid (ARMOUR THYROID) 60 MG tablet Take 1 tablet (60 mg) by mouth daily. 90 tablet 2    tiZANidine (ZANAFLEX) 4 MG tablet Take 1 tablet (4 mg) by mouth at bedtime. 90 tablet 1     No facility-administered medications prior to visit.           OBJECTIVE:  Physical Exam: General Appearance: healthy, alert, no distress, pleasant affect, cooperative.  Mental Status: Appearance/Cooperation: in no apparent distress and well developed and well nourished  Attitude: pleasant   Behavior :normal  Eye Contact: normal  Attention Span: good  Speech: normal volume, rate, and pitch  Affect: full and appropriate  Respiratory: able to speak in full sentences, no accessory muscle use noted, no sign of respiratory distress       LABS:  Results for orders placed or performed during the hospital encounter of 05/21/20   CBC w/ Diff [place]   Result Value Ref Range    WBC 6.3 4.0 - 10.0 1000/mm3    RBC 4.06 3.90 - 5.20 mill/mm3    Hgb 12.2 11.2 - 15.7 gm/dL    Hct 37.5 34.0 - 45.0 %    MCV 92.4 79.0 - 95.0 um3    MCH 30.0 26.0 - 32.0 pgm    MCHC 32.5 32.0 - 36.0 g/dL    RDW 12.0 12.0 - 14.0 %    MPV 9.3 (L) 9.4 - 12.4 fL    Plt Count 224 140 - 370 1000/mm3    ANC-Instrument 2.7 1.6 - 7.0 1000/mm3    Segs 43 %    Lymphocytes 39 %  Monocytes 12 %    Eosinophils 6 %    Basophils 0 %    ANC-Automated 2.7 1.6 - 7.0 1000/mm3    Abs Lymphs  2.5 0.8 - 3.1 1000/mm3    Abs Monos 0.7 0.2 - 0.8 1000/mm3    Abs Eosinophils 0.4 0.0 - 0.5 1000/mm3    Abs Basophils 0.0 <0.1 1000/mm3    Diff Type Automated    Comprehensive Metabolic Panel Green   Result Value Ref Range    Glucose 78 70 - 99 mg/dL    BUN 10 8 - 23 mg/dL    Creatinine 0.78 0.51 - 0.95 mg/dL    GFR >60 mL/min    Sodium 140 136 - 145 mmol/L    Potassium 4.0 3.5 - 5.1 mmol/L    Chloride 105 98 - 107 mmol/L    Bicarbonate 24 22 - 29 mmol/L    Anion Gap 11 7 - 15 mmol/L    Calcium 9.9 8.5 - 10.6 mg/dL    Total Protein 7.3 6.0 - 8.0 g/dL    Albumin 4.4 3.5 - 5.2 g/dL    Bilirubin, Tot 0.45 <1.2 mg/dL    AST (SGOT) 31 0 - 32 U/L    ALT (SGPT) 23 0 - 33 U/L    Alkaline Phos 54 35 - 140 U/L           ASSESSMENT & PLAN:  Debra Bolton is a 60 year old female was seen today for:  Debra Bolton was seen today for shortness of breath.    Diagnoses and all orders for this visit:    Hypothyroidism, unspecified type  -     TSH, Blood - See Instructions; Future  -     Free Thyroxine, Blood Green Plasma Separator Tube; Future  Check labs  Adequate exercise, at or above goal    Screened negative for significant alcohol use    Screened negative for drug use    Preventative health care  -     Comprehensive Metabolic Panel (Expected today and later); Future    Multiple sclerosis (CMS-HCC)  Pain is stable, follows with Neurology  Rectal bleeding  Has colonoscopy scheduled  SOB (shortness of breath) on exertion  Albuterol helping, follow up PFT results  COVID-19 long hauler manifesting chronic fatigue  Energy improving, still has some brain fog  Frequent falls  No fall recently, PT helping with balance, follow up at next visit      Health Maintenance   Topic Date Due    Shingles Vaccine (1 of 2) Never done    Influenza (Season Ended) 11/17/2020    PHQ9 Depression Monitoring doc flowsheet  11/22/2020    Breast Cancer Screen  04/08/2021    Medicare Annual Wellness Visit  06/16/2021    Colorectal Cancer Screening  01/16/2022     Cervical Cancer Screening  01/19/2022    Tetanus (2 - Td or Tdap) 01/20/2027    Hepatitis C Screening  Completed    COVID-19 Vaccine  Completed    Polio Vaccine  Aged Out    HPV Vaccine <= 42 Yrs  Aged Out    Meningococcal MCV4 Vaccine  Aged Out    Pneumococcal Vaccine  Aged Out       Return in about 10 weeks (around 10/01/2020) for f/u multiple issues.    Patient Instructions   Please get your flu shot and covid booster  Please see for follow up in June  Take care!  I personally spent 30 total minutes  in face -to-face and non-face-to-face activities related to the patients visit today, excluding any separately reportable services/procedures.    Patient's questions answered.    Debra Casebolt, MD

## 2020-07-24 ENCOUNTER — Encounter (INDEPENDENT_AMBULATORY_CARE_PROVIDER_SITE_OTHER): Payer: Self-pay | Admitting: Family Practice

## 2020-07-24 ENCOUNTER — Other Ambulatory Visit (INDEPENDENT_AMBULATORY_CARE_PROVIDER_SITE_OTHER): Payer: Medicare Other

## 2020-07-24 ENCOUNTER — Encounter (INDEPENDENT_AMBULATORY_CARE_PROVIDER_SITE_OTHER): Payer: Medicare Other

## 2020-07-24 ENCOUNTER — Ambulatory Visit (HOSPITAL_BASED_OUTPATIENT_CLINIC_OR_DEPARTMENT_OTHER)
Admission: RE | Admit: 2020-07-24 | Discharge: 2020-07-24 | Disposition: A | Discharge status: Home Health | Payer: Medicare Other | Source: Ambulatory Visit | Attending: Family Practice | Admitting: Family Practice

## 2020-07-24 ENCOUNTER — Ambulatory Visit
Admission: RE | Admit: 2020-07-24 | Discharge: 2020-07-24 | Disposition: A | Discharge status: Home / Self Care | Payer: Medicare Other | Attending: Family Practice | Admitting: Family Practice

## 2020-07-24 DIAGNOSIS — R5382 Chronic fatigue, unspecified: Secondary | ICD-10-CM | POA: Insufficient documentation

## 2020-07-24 DIAGNOSIS — U099 Post covid-19 condition, unspecified: Secondary | ICD-10-CM

## 2020-07-24 DIAGNOSIS — E039 Hypothyroidism, unspecified: Secondary | ICD-10-CM

## 2020-07-24 DIAGNOSIS — J988 Other specified respiratory disorders: Secondary | ICD-10-CM

## 2020-07-24 DIAGNOSIS — R0602 Shortness of breath: Secondary | ICD-10-CM

## 2020-07-24 DIAGNOSIS — Z0000 Encounter for general adult medical examination without abnormal findings: Secondary | ICD-10-CM

## 2020-07-24 DIAGNOSIS — Z8616 Personal history of COVID-19: Secondary | ICD-10-CM

## 2020-07-24 LAB — COMPREHENSIVE METABOLIC PANEL, BLOOD
ALT (SGPT): 49 U/L — ABNORMAL HIGH (ref 0–33)
AST (SGOT): 99 U/L — ABNORMAL HIGH (ref 0–32)
Albumin: 4.6 g/dL (ref 3.5–5.2)
Alkaline Phos: 53 U/L (ref 35–104)
Anion Gap: 13 mmol/L (ref 7–15)
BUN: 11 mg/dL (ref 8–23)
Bicarbonate: 26 mmol/L (ref 22–29)
Bilirubin, Tot: 0.42 mg/dL (ref <1.2)
Calcium: 10.2 mg/dL (ref 8.5–10.6)
Chloride: 100 mmol/L (ref 98–107)
Creatinine: 0.81 mg/dL (ref 0.51–0.95)
GFR: >60 mL/min
Glucose: 91 mg/dL (ref 70–99)
Potassium: 3.8 mmol/L (ref 3.5–5.1)
Sodium: 139 mmol/L (ref 136–145)
Total Protein: 7.5 g/dL (ref 6.0–8.0)

## 2020-07-24 LAB — FREE THYROXINE, BLOOD: Free T4: 0.85 ng/dL — ABNORMAL LOW (ref 0.93–1.70)

## 2020-07-24 LAB — TSH, BLOOD: TSH: 0.97 u[IU]/mL (ref 0.27–4.20)

## 2020-07-24 NOTE — Procedures (Signed)
No note

## 2020-07-24 NOTE — Patient Instructions (Signed)
Please get your flu shot and covid booster  Please see for follow up in June  Take care!

## 2020-07-25 ENCOUNTER — Encounter (INDEPENDENT_AMBULATORY_CARE_PROVIDER_SITE_OTHER): Payer: Self-pay | Admitting: Family Practice

## 2020-07-25 DIAGNOSIS — R748 Abnormal levels of other serum enzymes: Secondary | ICD-10-CM

## 2020-07-28 DIAGNOSIS — G894 Chronic pain syndrome: Secondary | ICD-10-CM | POA: Diagnosis not present

## 2020-07-28 DIAGNOSIS — M47816 Spondylosis without myelopathy or radiculopathy, lumbar region: Secondary | ICD-10-CM | POA: Diagnosis not present

## 2020-07-28 DIAGNOSIS — Z1389 Encounter for screening for other disorder: Secondary | ICD-10-CM | POA: Diagnosis not present

## 2020-07-28 DIAGNOSIS — M48061 Spinal stenosis, lumbar region without neurogenic claudication: Secondary | ICD-10-CM | POA: Diagnosis not present

## 2020-07-28 DIAGNOSIS — M461 Sacroiliitis, not elsewhere classified: Secondary | ICD-10-CM | POA: Diagnosis not present

## 2020-07-28 DIAGNOSIS — M5136 Other intervertebral disc degeneration, lumbar region: Secondary | ICD-10-CM | POA: Diagnosis not present

## 2020-07-29 ENCOUNTER — Other Ambulatory Visit: Payer: Self-pay | Admitting: Legal Medicine

## 2020-07-29 NOTE — Progress Notes (Addendum)
Chronic Care Management Pharmacy Note  08/05/2020 Name:  Dorothy Parker MRN:  [id number] DOB:  04-16-1961  Subjective: Dorothy Parker is an 60 y.o. year old female who is a primary patient of Dorothy Parker, Dorothy Comfort, MD.  The CCM team was consulted for assistance with disease management and care coordination needs.    Plan Updates:   Patient reports taking quinapril once daily and requested updated directions sent to pharmacy. Pharmacist requested from [place].   Patient reports foot rash is not resolved and toenails are thickening and discolored. Pharmacist advised patient to make an appointment for Dr. Henrene Parker to assess.   Pharmacist followed-up with referral coordinator on status of Dexa scan. Patient is in the list of patients to schedule and will be contacted soon.   Engaged with patient by telephone for initial visit in response to provider referral for pharmacy case management and/or care coordination services.   Consent to Services:  The patient was given the following information about Chronic Care Management services today, agreed to services, and gave verbal consent: 1. CCM service includes personalized support from designated clinical staff supervised by the primary care provider, including individualized plan of care and coordination with other care providers 2. 24/7 contact phone numbers for assistance for urgent and routine care needs. 3. Service will only be billed when office clinical staff spend 20 minutes or more in a month to coordinate care. 4. Only one practitioner may furnish and bill the service in a calendar month. 5.The patient may stop CCM services at any time (effective at the end of the month) by phone call to the office staff. 6. The patient will be responsible for cost sharing (co-pay) of up to 20% of the service fee (after annual deductible is met). Patient agreed to services and consent obtained.  Patient Care Team: Lillard Anes, MD as PCP - General  (Family Medicine) Dorothy Parker., MD as Referring Physician (Cardiology) Dorothy Rue, MD as Referring Physician (Vascular Surgery) Dorothy Parker as Consulting Physician (Ophthalmology) Burnice Logan, [facility] as Pharmacist (Pharmacist)  Recent office visits:  07/30/20- order placed for bone density  06/16/20. Dr. Henrene Parker PCP, hyperlipidemia, follow up 28mo, no medication changes, CBC normal, kidney and liver tests normal, Cholesterol normal,  CCM referral, follow up 615mo  06/03/20[facility], chronic pain syndrome  [facility] cardiac ultrasound,  follow up [id number]/1/21-Dr. FolOtho [facility]ardiology, no medication changes, follow up [id number] consult visits:  None  Hospital visits:  None in previous 6 months  Objective:  Lab Results  Component Value Date   CREATININE 0.62 06/16/2020   BUN 15 06/16/2020   GFRNONAA 97 12/17/2019   GFRAA 112 12/17/2019   NA 146 (H) 06/16/2020   K 4.0 06/16/2020   CALCIUM 9.7 06/16/2020   CO2 23 06/16/2020   GLUCOSE 91 06/16/2020    No results found for: HGBA1C, FRUCTOSAMINE, GFR, MICROALBUR  Last diabetic Eye exam: No results found for: HMDIABEYEEXA  Last diabetic Foot exam: No results found for: HMDIABFOOTEX   Lab Results  Component Value Date   CHOL 135 06/16/2020   HDL 59 06/16/2020   LDLCALC 55 06/16/2020   TRIG 122 06/16/2020   CHOLHDL 2.3 06/16/2020    Hepatic Function Latest Ref Rng & Units 06/16/2020 12/17/2019 08/16/2019  Total Protein 6.0 - 8.5 g/dL 7.0 6.5 6.6  Albumin 3.8 - 4.8 g/dL 4.6 4.4 4.4  AST 0 - 40 IU/L 24 15 12  ALT 0 - 32 IU/L 19 9 6   Alk Phosphatase 44 - 121 IU/L 94 97 105  Total Bilirubin 0.0 - 1.2 mg/dL 0.4 0.4 0.5    Lab Results  Component Value Date/Time   TSH 0.688 12/17/2019 09:34 AM    CBC Latest Ref Rng & Units 06/16/2020 12/17/2019 08/16/2019  WBC 3.4 - 10.8 x10E3/uL 8.0 7.6 7.0  Hemoglobin 11.1 - 15.9 g/dL 14.8 13.8  13.9  Hematocrit 34.0 - 46.6 % 43.7 40.3 40.3  Platelets 150 - 450 x10E3/uL 214 204 211    No results found for: VD25OH  Clinical ASCVD: Yes  The 10-year ASCVD risk score Dorothy Bussing DC Jr., et al., 2013) is: 8.6%   Values used to calculate the score:     Age: 82 years     Sex: Female     Is Non-Hispanic African American: No     Diabetic: No     Tobacco smoker: Yes     Systolic Blood Pressure: 751 mmHg     Is BP treated: Yes     HDL Cholesterol: 59 mg/dL     Total Cholesterol: 135 mg/dL    Depression screen [facility] 2/9 06/16/2020 12/17/2019 10/25/2019  Decreased Interest 0 1 0  Down, Depressed, Hopeless 1 0 0  PHQ - 2 Score 1 1 0  Altered sleeping 2 2 -  Tired, decreased energy 2 2 -  Change in appetite 1 0 -  Feeling bad or failure about yourself  0 0 -  Trouble concentrating 0 0 -  Moving slowly or fidgety/restless 0 0 -  Suicidal thoughts 0 0 -  PHQ-9 Score 6 5 -  Difficult doing work/chores Somewhat difficult Somewhat difficult -     Social History   Tobacco Use  Smoking Status Current Every Day Smoker  . Packs/day: 1.50  . Years: 45.00  . Pack years: 67.50  . Types: Cigarettes  Smokeless Tobacco Never Used   BP Readings from Last 3 Encounters:  06/16/20 (!) 140/58  12/17/19 120/66  10/25/19 118/60   Pulse Readings from Last 3 Encounters:  06/16/20 (!) 55  12/17/19 70  10/25/19 (!) 46   Wt Readings from Last 3 Encounters:  06/16/20 96 lb (43.5 kg)  12/17/19 99 lb 6.4 oz (45.1 kg)  10/25/19 97 lb 3.2 oz (44.1 kg)   BMI Readings from Last 3 Encounters:  06/16/20 17.01 kg/m  12/17/19 16.80 kg/m  10/25/19 16.17 kg/m    Assessment/Interventions: Review of patient past medical history, allergies, medications, health status, including review of consultants reports, laboratory and other test data, was performed as part of comprehensive evaluation and provision of chronic care management services.   SDOH:  (Social Determinants of Health) assessments and  interventions performed: Yes  SDOH Screenings   Alcohol Screen: Not on file  Depression (PHQ2-9): Medium Risk  . PHQ-2 Score: 6  Financial Resource Strain: Not on file  Food Insecurity: No Food Insecurity  . Worried About Charity fundraiser in the Last Year: Never true  . Ran Out of Food in the Last Year: Never true  Housing: Low Risk   . Last Housing Risk Score: 0  Physical Activity: Not on file  Social Connections: Not on file  Stress: Not on file  Tobacco Use: High Risk  . Smoking Tobacco Use: Current Every Day Smoker  . Smokeless Tobacco Use: Never Used  Transportation Needs: No Transportation Needs  . Lack of Transportation (Medical): No  . Lack of Transportation (Non-Medical): No  CCM Care Plan  Allergies  Allergen Reactions  . Bupropion Other (See Comments)  . Clarithromycin Other (See Comments)  . Clopidogrel Other (See Comments)  . Varenicline Other (See Comments)  . Vitamin D Analogs Rash    High dosage of Vitamin D will cause rash High dosage of Vitamin D will cause rash High dosage of Vitamin D will cause rash   . Prednisone Other (See Comments)    Hyperglycemia    Medications Reviewed Today    Reviewed by Burnice Logan, [facility] (Pharmacist) on 08/05/20 at 1041  Med List Status: <None>  Medication Order Taking? Sig Documenting Provider Last Dose Status Informant  ascorbic acid (VITAMIN C) 500 MG tablet [id number] Yes Take by mouth. [provider] Taking Active   aspirin 81 MG EC tablet [id number] Yes  [provider] Taking Active   glycopyrrolate (ROBINUL) 1 MG tablet [id number] Yes TAKE TWO TABLETS TWICE DAILY Lillard Anes, MD Taking Active   lidocaine (LIDODERM) 5 % [id number] Yes Place 1 patch onto the skin daily. [provider] Taking Active   metoprolol tartrate (LOPRESSOR) 25 MG tablet [id number] Yes TAKE 1 TABLET BY MOUTH TWICE A DAY Lillard Anes, MD Taking Active   naloxone [facility]) 4 MG/0.1ML LIQD nasal  spray kit [id number] Yes USE 1 SPRAY IN ONE NOSTRIL. MAY REPEAT IN OTHER NOSTRIL AFTER 2-3 MINS IF NEEDED [provider] Taking Active   nitroGLYCERIN (NITROLINGUAL) 0.4 MG/SPRAY spray [id number] Yes Place under the tongue. [provider] Taking Active   oxyCODONE-acetaminophen (PERCOCET) 7.5-325 MG tablet [id number] Yes Take 1 tablet by mouth 4 (four) times daily as needed. [provider] Taking Active   quinapril (ACCUPRIL) 10 MG tablet [id number] Yes TAKE 1 TABLET BY MOUTH TWICE DAILY  Patient taking differently: Take 10 mg by mouth daily.   Lillard Anes, MD Taking Active   rosuvastatin (CRESTOR) 5 MG tablet [id number] Yes Take 1 tablet (5 mg total) by mouth daily. Lillard Anes, MD Taking Active   STIOLTO RESPIMAT 2.5-2.5 MCG/ACT AERS [id number] Yes INHALE 1 PUFF INTO THE LUNGS IN THE MORNING AND AT BEDTIME. Lillard Anes, MD Taking Active   triamcinolone (KENALOG) 0.1 % [id number] Yes APPLY A THIN LAYER TO THE AFFECTED AREA(S) TWICE A DAY. Lillard Anes, MD Taking Active           Patient Active Problem List   Diagnosis Date Noted  . Adult BMI <19 kg/sq m 08/16/2019  . Malnutrition of moderate degree ([place]) 08/16/2019  . COPD (chronic obstructive pulmonary disease) ([place])   . Mixed hyperlipidemia   . Major depressive disorder, single episode, moderate ([place])   . Tobacco use disorder 10/03/2018  . Bilateral carotid artery stenosis 01/04/2018  . Hx of CABG 12/02/2016  . Essential hypertension 11/17/2015  . CAD in native artery 11/17/2015    Immunization History  Administered Date(s) Administered  . Influenza-Unspecified 02/02/2019, 03/12/2020  . Pneumococcal Conjugate-13 01/27/2015  . Pneumococcal Polysaccharide-23 01/27/2016    Conditions to be addressed/monitored:  Hypertension, Hyperlipidemia, Coronary Artery Disease, COPD and Depression  Care Plan : [place]  Updates made by Burnice Logan, RPH since  08/05/2020 12:00 AM    Problem: cad, htn, hld, copd   Priority: High  Onset Date: 08/05/2020    Long-Range Goal: Disease Management   Start Date: 08/05/2020  Expected End Date: 08/05/2021  This Visit's Progress: On track  Priority: High  Note:   Current Barriers:  . Suboptimal therapeutic regimen  for potential osteopenia/osteoporosis discovered on x-ray at orthopedic. Dexa scan ordered to assess and determine treatment.    Pharmacist Clinical Goal(s):  Marland Kitchen Patient will achieve control of bone density as evidenced by dexa results and recommended treatment through collaboration with PharmD and provider.   Interventions: . 1:1 collaboration with Lillard Anes, MD regarding development and update of comprehensive plan of care as evidenced by provider attestation and co-signature . Inter-disciplinary care team collaboration (see longitudinal plan of care) . Comprehensive medication review performed; medication list updated in electronic medical record  Hypertension (BP goal <130/80) -Not ideally controlled -Current treatment: . metoprolol tartrate 25 mg bid  . Quinapril 10 mg daily  -Medications previously tried: none reported -Current home readings: 120-140s/60-80s mmHg -Current dietary habits: Lives alone and doesn't cook for herself a lot. Fish and shrimp yesterday. Filet mignon in [place] on Sunday. Toast streudel, cereal or bacon and eggs for breakfast. Likes fruits and vegetables but hard to use before they go bad.  -Current exercise habits: Uses above ground pool at her home during warmer weather. Back pain limits exercise.  -Denies hypotensive/hypertensive symptoms -Educated on BP goals and benefits of medications for prevention of heart attack, stroke and kidney damage; Daily salt intake goal < 2300 mg; Exercise goal of 150 minutes per week; Importance of home blood pressure monitoring; -Counseled to monitor BP at home occasionally, document, and provide log at future  appointments -Counseled on diet and exercise extensively Collaborated with nurse to have updated prescription for quinapril once daily to pharmacy. Patient is not checking bp regularly but has occasional reading above goal. Patient continues to smoke and discussed smoking cessation.   Hyperlipidemia/CAD: (LDL goal < 55) -Controlled -Current treatment: . aspirin 81 mg ec  . crestor 5 mg daily  . Nitroglycerin 0.4 mg/spray prn -Medications previously tried: none reported  -Current dietary patterns: Drinks 2% milk daily. Rarely eats cheese.  -Current exercise habits: limited due to back pain.  -Educated on Cholesterol goals;  Benefits of statin for ASCVD risk reduction; Importance of limiting foods high in cholesterol; Exercise goal of 150 minutes per week; -Counseled on diet and exercise extensively Recommended to continue current medication  COPD (Goal: control symptoms and prevent exacerbations) -Controlled -Current treatment  . Stiolto 1 puff into the lungs in am and hs -Medications previously tried: none reported  -Gold Grade: Gold 2 (FEV1 50-79%) -Pulmonary function testing: 08/16/2019 - 61% -Exacerbations requiring treatment in last 6 months: none reported -Patient reports consistent use of maintenance inhaler -Frequency of rescue inhaler use: none per patient  -Counseled on Benefits of consistent maintenance inhaler use -Recommended to continue current medication  Osteoporosis / Osteopenia (Goal: minimize risk of bone loss/fractures) -Not ideally controlled -Last DEXA Scan:  Ordered to assess bone health and determine treatment plan  -Patient scheduled to complete Dexa scan. Ortho x-ray indicated bone loss per patient report.  -Current treatment  . None currently -Medications previously tried: n/a  -Recommend [phone] units of vitamin D daily. Recommend 1200 mg of calcium daily from dietary and supplemental sources. Recommend weight-bearing and muscle strengthening  exercises for building and maintaining bone density. -Counseled on diet and exercise extensively Recommended patient complete Dexa scan once scheduled. Verified that patient is to be scheduled by referral department.  Educated on calcium and vitamin d recommendations.   Tobacco use (Goal smoking cessation ) -Uncontrolled -Previous quit attempts: Chantix, patches, wellbutrin, gum -Current treatment  . None reported -Patient smokes Within 30 minutes of waking -Patient triggers include: watching  television, driving and finishing a meal -On a scale of 1-10, reports MOTIVATION to quit is 0 -On a scale of 1-10, reports CONFIDENCE in quitting is 0 -Provided contact information for [place] [facility]uit Line (1-800-QUIT-NOW) and encouraged patient to reach out to this group for support. -Counseled on benefits of smoking cessation. Discussed options of smoking cessation treatment when patient is ready.   Pain (Goal: manage symptoms of pain with specialist) -Controlled -Current treatment  . Oxycodone-acetaminophen 7.5-325 mg QID  . Naloxone 4 mg/0.1 ml nasal spray 1 spray in one nostril may repeat in other nostril after 2-3 minutes if needed -Medications previously tried: none reported   -Recommended to continue current medication  Sweating (Goal: minimize sweating) -Controlled -Current treatment  . glycopyrrolate 1 mg 2 tablets twice daily  -Medications previously tried: none reported  -Recommended to continue current medication  Health Maintenance -Vaccine gaps: COVID - not interested, Shingrix - hasn't had , TDAP - patient not interested at this time -Current therapy:  . vitamin c 500 mg daily -Educated on Cost vs benefit of each product must be carefully weighed by individual consumer -Patient is satisfied with current therapy and denies issues -Recommended to continue current medication    Patient Goals/Self-Care Activities . Patient will:  - take medications as prescribed focus on  medication adherence by considering a pill box.  check blood pressure weekly, document, and provide at future appointments engage in dietary modifications by limiting salt and focusing on heart-healthy diet.   Follow Up Plan: Telephone follow up appointment with care management team member scheduled for:06/2021      Medication Assistance: None required.  Patient affirms current coverage meets needs.  Patient's preferred pharmacy is:  [facility], [address] [place] [address] Phone: [phone] Fax: [phone]  Uses pill box? No - keeps stored on shelf in medicine cabinet Pt endorses good compliance  We discussed: Benefits of medication synchronization, packaging and delivery as well as enhanced pharmacist oversight with [employer]. Patient decided to: Continue current medication management strategy  Care Plan and Follow Up Patient Decision:  Patient agrees to Care Plan and Follow-up.  Plan: Telephone follow up appointment with care management team member scheduled for:  06/2021  Encounter details: CCM Time Spent      Value Time User   Time spent with patient (minutes)  87 08/05/2020  3:29 PM Burnice Logan, [facility]   Time spent performing Chart review  30 08/05/2020  3:29 PM Burnice Logan, [facility]   Total time (minutes)  117 08/05/2020  3:29 PM Burnice Logan, RPH     Moderate to High Complex Decision Making      Value Time User   Moderate to High complex decision making  Yes 08/05/2020 11:35 AM Burnice Logan, [facility]     CCM Services: This encounter meets complex CCM services and moderate to high decision making.  Prior to outreach and patient consent for Chronic Care Management, I referred this patient for services after reviewing the nominated patient list or from a personal encounter with the patient.  I have personally reviewed this encounter including the documentation in this note and have collaborated with the care  management provider regarding care management and care coordination activities to include development and update of the comprehensive care plan. I am certifying that I agree with the content of this note and encounter as supervising physician.

## 2020-07-30 ENCOUNTER — Other Ambulatory Visit: Payer: Self-pay

## 2020-07-30 ENCOUNTER — Telehealth: Payer: Self-pay

## 2020-07-30 DIAGNOSIS — Z780 Asymptomatic menopausal state: Secondary | ICD-10-CM

## 2020-07-30 NOTE — Telephone Encounter (Signed)
Patient is agreed. I put the order in.

## 2020-07-30 NOTE — Telephone Encounter (Signed)
Patient needs DXA scan for bone density lp

## 2020-07-30 NOTE — Telephone Encounter (Signed)
Pt called stating she was referred to orthopedics by Dr Marina Goodell. States the orthopedic doctor is requesting Dr. Marina Goodell place pt on fosamax. Pt states she does not go back to orthopedic. Does not know dosage requested. Please advise.

## 2020-07-31 ENCOUNTER — Telehealth: Payer: Self-pay

## 2020-07-31 NOTE — Progress Notes (Signed)
Chronic Care Management Pharmacy Assistant   Name: Dorothy Parker  MRN: [id number] DOB: 12/14/1960  Dorothy Parker is an 60 y.o. year old female who presents for his initial CCM visit with the clinical pharmacist.   Conditions to be addressed/monitored: HTN, HLD and COPD  Recent office visits:  07/30/20- order placed for bone density  06/16/20. Dr. Perry PCP, hyperlipidemia, follow up 6mos, no medication changes, CBC normal, kidney and liver tests normal, Cholesterol normal,  CCM referral, follow up 6mos.  06/03/20-Annaliese Jacobson, Surgery, chronic pain syndrome  05/08/20- Virginia Christman PA-C, vascular surgery, cardiac ultrasound,  follow up 6mos  02/18/20-Dr. Folk, cardiology, no medication changes, follow up 6mos  Recent consult visits:  None  Hospital visits:  None in previous 6 months  Medications: Outpatient Encounter Medications as of 07/31/2020  Medication Sig  . ascorbic acid (VITAMIN C) 500 MG tablet Take by mouth.  . aspirin 81 MG EC tablet   . glycopyrrolate (ROBINUL) 1 MG tablet TAKE TWO TABLETS TWICE DAILY  . lidocaine (LIDODERM) 5 %   . metoprolol tartrate (LOPRESSOR) 25 MG tablet TAKE 1 TABLET BY MOUTH TWICE A DAY  . naloxone (NARCAN) 4 MG/0.1ML LIQD nasal spray kit USE 1 SPRAY IN ONE NOSTRIL. MAY REPEAT IN OTHER NOSTRIL AFTER 2-3 MINS IF NEEDED  . nitroGLYCERIN (NITROLINGUAL) 0.4 MG/SPRAY spray Place under the tongue.  . oxyCODONE-acetaminophen (PERCOCET) 7.5-325 MG tablet   . quinapril (ACCUPRIL) 10 MG tablet TAKE 1 TABLET BY MOUTH TWICE DAILY  . rosuvastatin (CRESTOR) 5 MG tablet Take 1 tablet (5 mg total) by mouth daily.  . STIOLTO RESPIMAT 2.5-2.5 MCG/ACT AERS INHALE 1 PUFF INTO THE LUNGS IN THE MORNING AND AT BEDTIME.  . triamcinolone (KENALOG) 0.1 % APPLY A THIN LAYER TO THE AFFECTED AREA(S) TWICE A DAY.   No facility-administered encounter medications on file as of 07/31/2020.    Have you seen any other providers since your last visit?  No  Any changes in your medications or health? Patient stated she is tired a lot, no medication changes noted.   Any side effects from any medications? Patient has no reported side effects.   Do you have an symptoms or problems not managed by your medications? Patient has no unmanaged symptoms or problems at this time.   Any concerns about your health right now? Patient noted no specific concerns at this time.   Has your provider asked that you check blood pressure, blood sugar, or follow special diet at home? Patient does not check her blood sugars, she on occasion checks her blood pressures, she is trying to watch her diet, but doesn't as much as she should.  Do you get any type of exercise on a regular basis? Patient tries to stay active bur is limited due to her back pain.   Can you think of a goal you would like to reach for your health? Patient would like to be more active and have less back pain.   Do you have any problems getting your medications? Patient has no issues getting her medication.   Is there anything that you would like to discuss during the appointment? Patient would like to discuss activity level.   Patient knows to have medications.   Tami Kirby, CMA Clinical Pharmacist Assistant [phone]    

## 2020-08-02 ENCOUNTER — Ambulatory Visit (HOSPITAL_BASED_OUTPATIENT_CLINIC_OR_DEPARTMENT_OTHER): Payer: Medicare Other

## 2020-08-05 ENCOUNTER — Ambulatory Visit (INDEPENDENT_AMBULATORY_CARE_PROVIDER_SITE_OTHER): Payer: Medicare HMO

## 2020-08-05 ENCOUNTER — Other Ambulatory Visit: Payer: Self-pay

## 2020-08-05 DIAGNOSIS — I10 Essential (primary) hypertension: Secondary | ICD-10-CM

## 2020-08-05 DIAGNOSIS — E782 Mixed hyperlipidemia: Secondary | ICD-10-CM

## 2020-08-05 DIAGNOSIS — I2510 Atherosclerotic heart disease of native coronary artery without angina pectoris: Secondary | ICD-10-CM | POA: Diagnosis not present

## 2020-08-05 DIAGNOSIS — J449 Chronic obstructive pulmonary disease, unspecified: Secondary | ICD-10-CM | POA: Diagnosis not present

## 2020-08-05 MED ORDER — QUINAPRIL HCL 10 MG PO TABS: 10.0000 mg | ORAL_TABLET | Freq: Every day | ORAL | 0 refills | Status: DC

## 2020-08-05 NOTE — Patient Instructions (Addendum)
Visit Information  Thank you for your time discussing your medications. I look forward to working with you to achieve your health care goals. Below is a summary of what we talked about during our visit.   Goals Addressed            This Visit's Progress   . Improve My Heart Health-Coronary Artery Disease      . Learn More About My Health       Timeframe:  Long-Range Goal Priority:  High Start Date:                             Expected End Date:                        Follow Up Date 06/2021    - tell my story and reason for my visit - ask questions - bring a list of my medicines to the visit    Why is this important?    The best way to learn about your health and care is by talking to the doctor and nurse.   They will answer your questions and give you information in the way that you like best.    Notes:     Marland Kitchen Manage My Medicine       Timeframe:  Long-Range Goal Priority:  High Start Date:                             Expected End Date:                       Follow Up Date 06/2021    - call for medicine refill 2 or 3 days before it runs out - keep a list of all the medicines I take; vitamins and herbals too    Why is this important?   . These steps will help you keep on track with your medicines.   Notes:        Patient Care Plan: CCM Pharmacy Care Plan    Problem Identified: cad, htn, hld, copd   Priority: High  Onset Date: 08/05/2020    Long-Range Goal: Disease Management   Start Date: 08/05/2020  Expected End Date: 08/05/2021  This Visit's Progress: On track  Priority: High  Note:   Current Barriers:  . Suboptimal therapeutic regimen for potential osteopenia/osteoporosis discovered on x-ray at orthopedic. Dexa scan ordered to assess and determine treatment.    Pharmacist Clinical Goal(s):  Marland Kitchen Patient will achieve control of bone density as evidenced by dexa results and recommended treatment through collaboration with PharmD and provider.    Interventions: . 1:1 collaboration with Abigail Miyamoto, MD regarding development and update of comprehensive plan of care as evidenced by provider attestation and co-signature . Inter-disciplinary care team collaboration (see longitudinal plan of care) . Comprehensive medication review performed; medication list updated in electronic medical record  Hypertension (BP goal <130/80) -Not ideally controlled -Current treatment: . metoprolol tartrate 25 mg bid  . Quinapril 10 mg daily  -Medications previously tried: none reported -Current home readings: 120-140s/60-80s mmHg -Current dietary habits: Lives alone and doesn't cook for herself a lot. Fish and shrimp yesterday. Filet mignon in airfryer on Sunday. Toast streudel, cereal or bacon and eggs for breakfast. Likes fruits and vegetables but hard to use before they go bad.  -Current exercise habits: Uses  above ground pool at her home during warmer weather. Back pain limits exercise.  -Denies hypotensive/hypertensive symptoms -Educated on BP goals and benefits of medications for prevention of heart attack, stroke and kidney damage; Daily salt intake goal < 2300 mg; Exercise goal of 150 minutes per week; Importance of home blood pressure monitoring; -Counseled to monitor BP at home occasionally, document, and provide log at future appointments -Counseled on diet and exercise extensively Collaborated with nurse to have updated prescription for quinapril once daily to pharmacy. Patient is not checking bp regularly but has occasional reading above goal. Patient continues to smoke and discussed smoking cessation.   Hyperlipidemia/CAD: (LDL goal < 55) -Controlled -Current treatment: . aspirin 81 mg ec  . crestor 5 mg daily  . Nitroglycerin 0.4 mg/spray prn -Medications previously tried: none reported  -Current dietary patterns: Drinks 2% milk daily. Rarely eats cheese.  -Current exercise habits: limited due to back pain.  -Educated  on Cholesterol goals;  Benefits of statin for ASCVD risk reduction; Importance of limiting foods high in cholesterol; Exercise goal of 150 minutes per week; -Counseled on diet and exercise extensively Recommended to continue current medication  COPD (Goal: control symptoms and prevent exacerbations) -Controlled -Current treatment  . Stiolto 1 puff into the lungs in am and hs -Medications previously tried: none reported  -Gold Grade: Gold 2 (FEV1 50-79%) -Pulmonary function testing: 08/16/2019 - 61% -Exacerbations requiring treatment in last 6 months: none reported -Patient reports consistent use of maintenance inhaler -Frequency of rescue inhaler use: none per patient  -Counseled on Benefits of consistent maintenance inhaler use -Recommended to continue current medication  Osteoporosis / Osteopenia (Goal: minimize risk of bone loss/fractures) -Not ideally controlled -Last DEXA Scan:  Ordered to assess bone health and determine treatment plan  -Patient scheduled to complete Dexa scan. Ortho x-ray indicated bone loss per patient report.  -Current treatment  . None currently -Medications previously tried: n/a  -Recommend [phone] units of vitamin D daily. Recommend 1200 mg of calcium daily from dietary and supplemental sources. Recommend weight-bearing and muscle strengthening exercises for building and maintaining bone density. -Counseled on diet and exercise extensively Recommended patient complete Dexa scan once scheduled. Verified that patient is to be scheduled by referral department.  Educated on calcium and vitamin d recommendations.   Tobacco use (Goal smoking cessation ) -Uncontrolled -Previous quit attempts: Chantix, patches, wellbutrin, gum -Current treatment  . None reported -Patient smokes Within 30 minutes of waking -Patient triggers include: watching television, driving and finishing a meal -On a scale of 1-10, reports MOTIVATION to quit is 0 -On a scale of 1-10,  reports CONFIDENCE in quitting is 0 -Provided contact information for [employer] Quit Line (1-800-QUIT-NOW) and encouraged patient to reach out to this group for support. -Counseled on benefits of smoking cessation. Discussed options of smoking cessation treatment when patient is ready.   Pain (Goal: manage symptoms of pain with specialist) -Controlled -Current treatment  . Oxycodone-acetaminophen 7.5-325 mg QID  . Naloxone 4 mg/0.1 ml nasal spray 1 spray in one nostril may repeat in other nostril after 2-3 minutes if needed -Medications previously tried: none reported   -Recommended to continue current medication  Sweating (Goal: minimize sweating) -Controlled -Current treatment  . glycopyrrolate 1 mg 2 tablets twice daily  -Medications previously tried: none reported  -Recommended to continue current medication  Health Maintenance -Vaccine gaps: COVID - not interested, Shingrix - hasn't had , TDAP - patient not interested at this time -Current therapy:  . vitamin c 500  mg daily -Educated on Cost vs benefit of each product must be carefully weighed by individual consumer -Patient is satisfied with current therapy and denies issues -Recommended to continue current medication    Patient Goals/Self-Care Activities . Patient will:  - take medications as prescribed focus on medication adherence by considering a pill box.  check blood pressure weekly, document, and provide at future appointments engage in dietary modifications by limiting salt and focusing on heart-healthy diet.   Follow Up Plan: Telephone follow up appointment with care management team member scheduled for:06/2021      Ms. Pettie was given information about Chronic Care Management services today including:  1. CCM service includes personalized support from designated clinical staff supervised by her physician, including individualized plan of care and coordination with other care providers 2. 24/7 contact phone  numbers for assistance for urgent and routine care needs. 3. Standard insurance, coinsurance, copays and deductibles apply for chronic care management only during months in which we provide at least 20 minutes of these services. Most insurances cover these services at 100%, however patients may be responsible for any copay, coinsurance and/or deductible if applicable. This service may help you avoid the need for more expensive face-to-face services. 4. Only one practitioner may furnish and bill the service in a calendar month. 5. The patient may stop CCM services at any time (effective at the end of the month) by phone call to the office staff.  Patient agreed to services and verbal consent obtained.   The patient verbalized understanding of instructions, educational materials, and care plan provided today and declined offer to receive copy of patient instructions, educational materials, and care plan.  Telephone follow up appointment with pharmacy team member scheduled for: 06/2021  Juliane Lack, PharmD Clinical Pharmacist [facility] [phone] (office) [phone] (mobile)  Steps to Quit Smoking Smoking tobacco is the leading cause of preventable death. It can affect almost every organ in the body. Smoking puts you and people around you at risk for many serious, long-lasting (chronic) diseases. Quitting smoking can be hard, but it is one of the best things that you can do for your health. It is never too late to quit. How do I get ready to quit? When you decide to quit smoking, make a plan to help you succeed. Before you quit:  Pick a date to quit. Set a date within the next 2 weeks to give you time to prepare.  Write down the reasons why you are quitting. Keep this list in places where you will see it often.  Tell your family, friends, and co-workers that you are quitting. Their support is important.  Talk with your doctor about the choices that may help you quit.  Find  out if your health insurance will pay for these treatments.  Know the people, places, things, and activities that make you want to smoke (triggers). Avoid them. What first steps can I take to quit smoking?  Throw away all cigarettes at home, at work, and in your car.  Throw away the things that you use when you smoke, such as ashtrays and lighters.  Clean your car. Make sure to empty the ashtray.  Clean your home, including curtains and carpets. What can I do to help me quit smoking? Talk with your doctor about taking medicines and seeing a counselor at the same time. You are more likely to succeed when you do both.  If you are pregnant or breastfeeding, talk with your doctor about  counseling or other ways to quit smoking. Do not take medicine to help you quit smoking unless your doctor tells you to do so. To quit smoking: Quit right away  Quit smoking totally, instead of slowly cutting back on how much you smoke over a period of time.  Go to counseling. You are more likely to quit if you go to counseling sessions regularly. Take medicine You may take medicines to help you quit. Some medicines need a prescription, and some you can buy over-the-counter. Some medicines may contain a drug called nicotine to replace the nicotine in cigarettes. Medicines may:  Help you to stop having the desire to smoke (cravings).  Help to stop the problems that come when you stop smoking (withdrawal symptoms). Your doctor may ask you to use:  Nicotine patches, gum, or lozenges.  Nicotine inhalers or sprays.  Non-nicotine medicine that is taken by mouth. Find resources Find resources and other ways to help you quit smoking and remain smoke-free after you quit. These resources are most helpful when you use them often. They include:  Online chats with a Veterinary surgeoncounselor.  Phone quitlines.  Printed Materials engineerself-help materials.  Support groups or group counseling.  Text messaging programs.  Mobile phone  apps. Use apps on your mobile phone or tablet that can help you stick to your quit plan. There are many free apps for mobile phones and tablets as well as websites. Examples include Quit Guide from the [employer] and smokefree.gov   What things can I do to make it easier to quit?  Talk to your family and friends. Ask them to support and encourage you.  Call a phone quitline (1-800-QUIT-NOW), reach out to support groups, or work with a Veterinary surgeoncounselor.  Ask people who smoke to not smoke around you.  Avoid places that make you want to smoke, such as: ? Bars. ? Parties. ? Smoke-break areas at work.  Spend time with people who do not smoke.  Lower the stress in your life. Stress can make you want to smoke. Try these things to help your stress: ? Getting regular exercise. ? Doing deep-breathing exercises. ? Doing yoga. ? Meditating. ? Doing a body scan. To do this, close your eyes, focus on one area of your body at a time from head to toe. Notice which parts of your body are tense. Try to relax the muscles in those areas.   How will I feel when I quit smoking? Day 1 to 3 weeks Within the first 24 hours, you may start to have some problems that come from quitting tobacco. These problems are very bad 2-3 days after you quit, but they do not often last for more than 2-3 weeks. You may get these symptoms:  Mood swings.  Feeling restless, nervous, angry, or annoyed.  Trouble concentrating.  Dizziness.  Strong desire for high-sugar foods and nicotine.  Weight gain.  Trouble pooping (constipation).  Feeling like you may vomit (nausea).  Coughing or a sore throat.  Changes in how the medicines that you take for other issues work in your body.  Depression.  Trouble sleeping (insomnia). Week 3 and afterward After the first 2-3 weeks of quitting, you may start to notice more positive results, such as:  Better sense of smell and taste.  Less coughing and sore throat.  Slower heart  rate.  Lower blood pressure.  Clearer skin.  Better breathing.  Fewer sick days. Quitting smoking can be hard. Do not give up if you fail the first time.  Some people need to try a few times before they succeed. Do your best to stick to your quit plan, and talk with your doctor if you have any questions or concerns. Summary  Smoking tobacco is the leading cause of preventable death. Quitting smoking can be hard, but it is one of the best things that you can do for your health.  When you decide to quit smoking, make a plan to help you succeed.  Quit smoking right away, not slowly over a period of time.  When you start quitting, seek help from your doctor, family, or friends. This information is not intended to replace advice given to you by your health care provider. Make sure you discuss any questions you have with your health care provider. Document Revised: 12/29/2018 Document Reviewed: 06/24/2018 Elsevier Patient Education  2021 [employer].

## 2020-08-07 ENCOUNTER — Telehealth: Payer: Self-pay | Admitting: Legal Medicine

## 2020-08-07 NOTE — Telephone Encounter (Signed)
   Dorothy Parker has been scheduled for the following appointment:  WHAT: Dexa Scan WHERE: [facility] DATE: 08/12/20 TIME: arrive @ 8:00 for 8:30 appt.  Patient has been made aware.

## 2020-08-12 ENCOUNTER — Encounter: Payer: Self-pay | Admitting: Legal Medicine

## 2020-08-12 DIAGNOSIS — M810 Age-related osteoporosis without current pathological fracture: Secondary | ICD-10-CM | POA: Insufficient documentation

## 2020-08-12 DIAGNOSIS — M8589 Other specified disorders of bone density and structure, multiple sites: Secondary | ICD-10-CM | POA: Diagnosis not present

## 2020-08-12 DIAGNOSIS — N959 Unspecified menopausal and perimenopausal disorder: Secondary | ICD-10-CM | POA: Diagnosis not present

## 2020-08-12 LAB — HM DEXA SCAN

## 2020-08-13 ENCOUNTER — Encounter: Payer: Self-pay | Admitting: Legal Medicine

## 2020-08-14 ENCOUNTER — Encounter (INDEPENDENT_AMBULATORY_CARE_PROVIDER_SITE_OTHER): Payer: Self-pay | Admitting: Family Practice

## 2020-08-14 ENCOUNTER — Encounter (INDEPENDENT_AMBULATORY_CARE_PROVIDER_SITE_OTHER): Payer: Self-pay | Admitting: Internal Medicine

## 2020-08-14 ENCOUNTER — Telehealth: Payer: Self-pay

## 2020-08-14 DIAGNOSIS — D8989 Other specified disorders involving the immune mechanism, not elsewhere classified: Secondary | ICD-10-CM

## 2020-08-14 MED ORDER — ALENDRONATE SODIUM 70 MG PO TABS: 70.0000 mg | ORAL_TABLET | ORAL | 2 refills | Status: DC

## 2020-08-14 NOTE — Telephone Encounter (Signed)
Patient was informed about Bone density results. She would like to try fosamax. I sent prescription to the pharmacy.

## 2020-08-18 DIAGNOSIS — E785 Hyperlipidemia, unspecified: Secondary | ICD-10-CM | POA: Diagnosis not present

## 2020-08-18 DIAGNOSIS — Z951 Presence of aortocoronary bypass graft: Secondary | ICD-10-CM | POA: Diagnosis not present

## 2020-08-18 DIAGNOSIS — I2510 Atherosclerotic heart disease of native coronary artery without angina pectoris: Secondary | ICD-10-CM | POA: Diagnosis not present

## 2020-08-18 DIAGNOSIS — F17200 Nicotine dependence, unspecified, uncomplicated: Secondary | ICD-10-CM | POA: Diagnosis not present

## 2020-08-18 DIAGNOSIS — I10 Essential (primary) hypertension: Secondary | ICD-10-CM | POA: Diagnosis not present

## 2020-08-18 DIAGNOSIS — I6523 Occlusion and stenosis of bilateral carotid arteries: Secondary | ICD-10-CM | POA: Diagnosis not present

## 2020-08-20 NOTE — Telephone Encounter (Signed)
Pt has been scheduled for video visit on 09/30/20 with Dr. Candiss Norse. Per pt if she needs any labs prior to visit please place orders pt will go to lab on 09/23/20.    Thank you

## 2020-08-25 DIAGNOSIS — M461 Sacroiliitis, not elsewhere classified: Secondary | ICD-10-CM | POA: Diagnosis not present

## 2020-08-25 DIAGNOSIS — M5136 Other intervertebral disc degeneration, lumbar region: Secondary | ICD-10-CM | POA: Diagnosis not present

## 2020-08-25 DIAGNOSIS — Z1389 Encounter for screening for other disorder: Secondary | ICD-10-CM | POA: Diagnosis not present

## 2020-08-25 DIAGNOSIS — M48061 Spinal stenosis, lumbar region without neurogenic claudication: Secondary | ICD-10-CM | POA: Diagnosis not present

## 2020-08-25 DIAGNOSIS — G894 Chronic pain syndrome: Secondary | ICD-10-CM | POA: Diagnosis not present

## 2020-08-25 DIAGNOSIS — M47816 Spondylosis without myelopathy or radiculopathy, lumbar region: Secondary | ICD-10-CM | POA: Diagnosis not present

## 2020-09-05 ENCOUNTER — Encounter (INDEPENDENT_AMBULATORY_CARE_PROVIDER_SITE_OTHER): Payer: Self-pay | Admitting: Family Practice

## 2020-09-05 ENCOUNTER — Encounter (INDEPENDENT_AMBULATORY_CARE_PROVIDER_SITE_OTHER): Payer: Self-pay | Admitting: Ophthalmology

## 2020-09-05 ENCOUNTER — Encounter (INDEPENDENT_AMBULATORY_CARE_PROVIDER_SITE_OTHER): Payer: Self-pay | Admitting: Interventional Cardiology

## 2020-09-05 ENCOUNTER — Telehealth: Payer: Self-pay

## 2020-09-05 NOTE — Telephone Encounter (Signed)
Left message for patient to return my call.

## 2020-09-05 NOTE — Telephone Encounter (Signed)
From: [name]  To: Belal Hurman Horn, MD  Sent: 09/05/2020 10:50 AM PDT  Subject: appointment    I have an appt with Dr. Leonia Corona on June 20th, is it possible to do a video chat? I am having cattaract surgery on the 27th and not driving until then.   Thank you Debra Bolton

## 2020-09-05 NOTE — Telephone Encounter (Signed)
Pt called states she has been having pain from calves to tailbone. Rates pain "20"/10. States it is worse than back pain. She did have stomach bug for about 8 days and started getting better about 4 days ago. Pt is concerned it is the fosamax. Looks like fosamax was filled 4/28. Denies chest pain.   Due to pain, did recommend pt go to urgent care. Pt states she does not have a ride. Pt would like recommendations for something to do at home. States if it gets to where she cant stand it she could call someone.   Please advise.   Lorita Officer, [place] 09/05/20 9:25 AM

## 2020-09-11 ENCOUNTER — Telehealth (INDEPENDENT_AMBULATORY_CARE_PROVIDER_SITE_OTHER): Payer: Medicare Other

## 2020-09-14 ENCOUNTER — Other Ambulatory Visit: Payer: Self-pay | Admitting: Ophthalmology

## 2020-09-14 LAB — EMMI , PATIENT SATISFACTION: HOSPITAL DISCHARGE EXPECTATIONS: EMMI Video Order Number: 17951965981

## 2020-09-14 LAB — EMMI,  PAIN MANAGEMENT: ACUTE IN HOSPITAL: EMMI Video Order Number: 10110330023

## 2020-09-14 LAB — EMMI , ANESTHESIA (ADULT): EMMI Video Order Number: 16373886692

## 2020-09-14 LAB — EMMI , CATARACT SURGERY

## 2020-09-16 LAB — EMMI , COLONOSCOPY: EMMI Video Order Number: 15654046844

## 2020-09-18 ENCOUNTER — Encounter (HOSPITAL_BASED_OUTPATIENT_CLINIC_OR_DEPARTMENT_OTHER): Payer: Self-pay | Admitting: Internal Medicine

## 2020-09-18 DIAGNOSIS — M2550 Pain in unspecified joint: Secondary | ICD-10-CM

## 2020-09-19 ENCOUNTER — Telehealth: Payer: Self-pay

## 2020-09-19 NOTE — Progress Notes (Signed)
Chronic Care Management Pharmacy Assistant   Name: Dorothy Parker  MRN: [id number] DOB: 09/08/1960   Reason for Encounter: Disease State for hypertension  Recent office visits:  09/05/20-patient message, Pt called states she has been having pain from calves to [place]. Rates pain "20"/10. States it is worse than back pain. She did have stomach bug for about 8 days and started getting better about 4 days ago. Pt is concerned it is the fosamax. Looks like fosamax was filled 4/28. Denies chest pain.  Due to pain, did recommend pt go to urgent care. Pt states she does not have a ride. Pt would like recommendations for something to do at home. States if it gets to where she cant stand it she could call someone.   08/18/20-cardiology, [place] seems to be doing well. She denies any anginal sounding chest discomfort and seems to be stable and asymptomatic with her coronary disease and previous coronary artery bypass grafting. She reports her cholesterol is followed very closely by Dr. Henrene Pastor and is well controlled. Her blood pressure looks to be well controlled here in the office today. She follows closely with the vascular surgeons for her carotid artery disease and should be due for follow-up carotid Dopplers again this summer. I counseled her again to discontinue her smoking.   08/07/20-Dexa scan scheduled for 08/12/20  08/05/20-Sara Owens Shark, [place]- Patient reports taking quinapril once daily and requested updated directions sent to pharmacy. Pharmacist requested from [place].  Patient reports foot rash is not resolved and toenails are thickening and discolored. Pharmacist advised patient to make an appointment for Dr. Henrene Pastor to assess.  Pharmacist followed-up with referral coordinator on status of Dexa scan. Patient is in the list of patients to schedule and will be contacted soon.    Recent consult visits:  none  Hospital visits:  None in previous 6 months  Medications: Outpatient Encounter  Medications as of 09/19/2020  Medication Sig  . alendronate (FOSAMAX) 70 MG tablet Take 1 tablet (70 mg total) by mouth every 7 (seven) days. Take with a full glass of water on an empty stomach.  Marland Kitchen ascorbic acid (VITAMIN C) 500 MG tablet Take by mouth.  Marland Kitchen aspirin 81 MG EC tablet   . glycopyrrolate (ROBINUL) 1 MG tablet TAKE TWO TABLETS TWICE DAILY  . lidocaine (LIDODERM) 5 % Place 1 patch onto the skin daily.  . metoprolol tartrate (LOPRESSOR) 25 MG tablet TAKE 1 TABLET BY MOUTH TWICE A DAY  . naloxone (NARCAN) 4 MG/0.1ML LIQD nasal spray kit USE 1 SPRAY IN ONE NOSTRIL. MAY REPEAT IN OTHER NOSTRIL AFTER 2-3 MINS IF NEEDED  . nitroGLYCERIN (NITROLINGUAL) 0.4 MG/SPRAY spray Place under the tongue.  Marland Kitchen oxyCODONE-acetaminophen (PERCOCET) 7.5-325 MG tablet Take 1 tablet by mouth 4 (four) times daily as needed.  . quinapril (ACCUPRIL) 10 MG tablet Take 1 tablet (10 mg total) by mouth daily.  . rosuvastatin (CRESTOR) 5 MG tablet Take 1 tablet (5 mg total) by mouth daily.  Marland Kitchen STIOLTO RESPIMAT 2.5-2.5 MCG/ACT AERS INHALE 1 PUFF INTO THE LUNGS IN THE MORNING AND AT BEDTIME.  Marland Kitchen triamcinolone (KENALOG) 0.1 % APPLY A THIN LAYER TO THE AFFECTED AREA(S) TWICE A DAY.   No facility-administered encounter medications on file as of 09/19/2020.     Recent Office Vitals: BP Readings from Last 3 Encounters:  06/16/20 (!) 140/58  12/17/19 120/66  10/25/19 118/60   Pulse Readings from Last 3 Encounters:  06/16/20 (!) 55  12/17/19 70  10/25/19 (!) 46  Wt Readings from Last 3 Encounters:  06/16/20 96 lb (43.5 kg)  12/17/19 99 lb 6.4 oz (45.1 kg)  10/25/19 97 lb 3.2 oz (44.1 kg)     Kidney Function Lab Results  Component Value Date/Time   CREATININE 0.62 06/16/2020 09:16 AM   CREATININE 0.63 12/17/2019 09:34 AM   GFRNONAA 97 12/17/2019 09:34 AM   GFRAA 112 12/17/2019 09:34 AM    BMP Latest Ref Rng & Units 06/16/2020 12/17/2019 08/16/2019  Glucose 65 - 99 mg/dL 91 81 82  BUN 8 - 27 mg/dL _0 Creatinine 0.57 - 1.00 mg/dL 0.62 0.63 0.60  BUN/Creat Ratio 12 - _1 Sodium 134 - 144 mmol/L 146(H) 142 141  Potassium 3.5 - 5.2 mmol/L 4.0 4.1 4.2  Chloride 96 - 106 mmol/L 106 105 107(H)  CO2 20 - 29 mmol/L _2 Calcium 8.7 - 10.3 mg/dL 9.7 9.4 9.2     . Current antihypertensive regimen:   metoprolol tartrate 25 mg bid   Quinapril 10 mg daily   Patient verbally confirms she is taking the above medications as directed. Yes  . How often are you checking your Blood Pressure? infrequently  . Current home BP readings: patient readings have been 102/56, 106/60, when she had the stomach bug not long ago they were 83/59 and 80/50  . Wrist or arm cuff: arm cuff, she has ordered a new one from [place] . Caffeine intake: Patient denies drinking caffeine . Salt intake: Patient has no added salt . OTC medications including pseudoephedrine or NSAIDs? Patient denies any use  . Any readings above 180/120? No  . What recent interventions/DTPs have been made by any provider to improve Blood Pressure control since last CPP Visit: Patient watches her diet and tries to stay active.  She stated when she was sick she lost down to 85 pounds, she is now gaining some of her weight back.  She stated her legs cramps have improved.   . What diet changes have been made to improve Blood Pressure Control?  Patient watches her salt intake  . What exercise is being done to improve your Blood Pressure Control?  Patient is active, she stated when she was sick she didn't do anything.   Adherence Review: Is the patient currently on ACE/ARB medication? Yes Does the patient have >5 day gap between last estimated fill dates? CPP to review   Star Rating Drugs:  Medication:  Last Fill: Day Supply Metoprolol  08/22/20  90 Rosuvastatin  07/11/20 90 Quinapril  07/29/20 [place], [place] Pharmacist Assistant [phone]

## 2020-09-22 ENCOUNTER — Other Ambulatory Visit: Payer: Self-pay

## 2020-09-22 DIAGNOSIS — M461 Sacroiliitis, not elsewhere classified: Secondary | ICD-10-CM | POA: Diagnosis not present

## 2020-09-22 DIAGNOSIS — M47816 Spondylosis without myelopathy or radiculopathy, lumbar region: Secondary | ICD-10-CM | POA: Diagnosis not present

## 2020-09-22 DIAGNOSIS — Z1389 Encounter for screening for other disorder: Secondary | ICD-10-CM | POA: Diagnosis not present

## 2020-09-22 DIAGNOSIS — G894 Chronic pain syndrome: Secondary | ICD-10-CM | POA: Diagnosis not present

## 2020-09-22 DIAGNOSIS — M48061 Spinal stenosis, lumbar region without neurogenic claudication: Secondary | ICD-10-CM | POA: Diagnosis not present

## 2020-09-22 DIAGNOSIS — M5136 Other intervertebral disc degeneration, lumbar region: Secondary | ICD-10-CM | POA: Diagnosis not present

## 2020-09-23 ENCOUNTER — Ambulatory Visit (HOSPITAL_BASED_OUTPATIENT_CLINIC_OR_DEPARTMENT_OTHER)
Admit: 2020-09-23 | Discharge: 2020-09-23 | Disposition: A | Discharge status: Home Health | Payer: Medicare Other | Attending: Neurology | Admitting: Neurology

## 2020-09-23 ENCOUNTER — Other Ambulatory Visit (INDEPENDENT_AMBULATORY_CARE_PROVIDER_SITE_OTHER): Payer: Medicare Other

## 2020-09-23 ENCOUNTER — Ambulatory Visit (INDEPENDENT_AMBULATORY_CARE_PROVIDER_SITE_OTHER): Payer: Medicare Other

## 2020-09-23 ENCOUNTER — Other Ambulatory Visit: Payer: Self-pay

## 2020-09-23 ENCOUNTER — Ambulatory Visit
Admission: RE | Admit: 2020-09-23 | Discharge: 2020-09-23 | Disposition: A | Discharge status: Home / Self Care | Payer: Medicare Other | Attending: Interventional Cardiology | Admitting: Interventional Cardiology

## 2020-09-23 DIAGNOSIS — Z136 Encounter for screening for cardiovascular disorders: Secondary | ICD-10-CM

## 2020-09-23 DIAGNOSIS — R748 Abnormal levels of other serum enzymes: Secondary | ICD-10-CM | POA: Insufficient documentation

## 2020-09-23 DIAGNOSIS — D8989 Other specified disorders involving the immune mechanism, not elsewhere classified: Secondary | ICD-10-CM | POA: Insufficient documentation

## 2020-09-23 DIAGNOSIS — I700 Atherosclerosis of aorta: Secondary | ICD-10-CM

## 2020-09-23 DIAGNOSIS — H25041 Posterior subcapsular polar age-related cataract, right eye: Secondary | ICD-10-CM

## 2020-09-23 DIAGNOSIS — Z8249 Family history of ischemic heart disease and other diseases of the circulatory system: Secondary | ICD-10-CM | POA: Insufficient documentation

## 2020-09-23 DIAGNOSIS — G35 Multiple sclerosis: Secondary | ICD-10-CM | POA: Insufficient documentation

## 2020-09-23 LAB — COMPREHENSIVE METABOLIC PANEL, BLOOD
ALT (SGPT): 18 U/L (ref 0–33)
AST (SGOT): 21 U/L (ref 0–32)
Albumin: 4.4 g/dL (ref 3.5–5.2)
Alkaline Phos: 56 U/L (ref 35–104)
Anion Gap: 12 mmol/L (ref 7–15)
BUN: 9 mg/dL (ref 8–23)
Bicarbonate: 26 mmol/L (ref 22–29)
Bilirubin, Tot: 0.35 mg/dL (ref <1.2)
Calcium: 9.9 mg/dL (ref 8.5–10.6)
Chloride: 104 mmol/L (ref 98–107)
Creatinine: 0.82 mg/dL (ref 0.51–0.95)
GFR: >60 mL/min
Glucose: 93 mg/dL (ref 70–99)
Potassium: 3.8 mmol/L (ref 3.5–5.1)
Sodium: 142 mmol/L (ref 136–145)
Total Protein: 7.2 g/dL (ref 6.0–8.0)
eGFR Based on CKD-EPI 2021 Equation: >60 mL/min

## 2020-09-23 LAB — EMMI , CATARACT SURGERY: EMMI Video Order Number: 13958939046

## 2020-09-23 LAB — C-REACTIVE PROTEIN, BLOOD: CRP: <0.3 mg/dL (ref <0.5)

## 2020-09-23 LAB — CBC WITH DIFF, BLOOD
ANC-Automated: 3.3 10*3/uL (ref 1.6–7.0)
Abs Basophils: 0 10*3/uL (ref <0.1)
Abs Eosinophils: 0.3 10*3/uL (ref 0.0–0.5)
Abs Lymphs: 1.8 10*3/uL (ref 0.8–3.1)
Abs Monos: 0.7 10*3/uL (ref 0.2–0.8)
Basophils: 1 %
Eosinophils: 5 %
Hct: 38.1 % (ref 34.0–45.0)
Hgb: 12.2 gm/dL (ref 11.2–15.7)
Lymphocytes: 30 %
MCH: 29.5 pg (ref 26.0–32.0)
MCHC: 32 g/dL (ref 32.0–36.0)
MCV: 92.3 um3 (ref 79.0–95.0)
MPV: 9.5 fL (ref 9.4–12.4)
Monocytes: 11 %
Plt Count: 271 10*3/uL (ref 140–370)
RBC: 4.13 10*6/uL (ref 3.90–5.20)
RDW: 11.9 % — ABNORMAL LOW (ref 12.0–14.0)
Segs: 54 %
WBC: 6.2 10*3/uL (ref 4.0–10.0)

## 2020-09-23 LAB — IGG, BLOOD: IGG: 960 mg/dL (ref 700–1600)

## 2020-09-23 LAB — SED RATE, BLOOD: Sed Rate: 8 mm/hr (ref 0–30)

## 2020-09-23 MED ORDER — GADOBUTROL 1 MMOL/ML IV SOLN (WRAPPED RECORD)
6.5000 mL | Freq: Once | INTRAVENOUS | Status: AC
Start: 2020-09-23 — End: 2020-09-23
  Administered 2020-09-23 (×2): 6.5 mL via INTRAVENOUS

## 2020-09-23 MED ORDER — GADOBUTROL 1 MMOL/ML IV SOLN (WRAPPED RECORD)
INTRAVENOUS | Status: AC
Start: 2020-09-23 — End: ?
  Filled 2020-09-23: qty 10

## 2020-09-23 NOTE — Patient Instructions (Signed)
[facility] PREOPERATIVE SURGICAL INFORMATION     DAY OF SURGERY      Your Surgery is scheduled on 09/26/20  [facility] OR Desk will call you the afternoon before surgery with your arrival time. If you need to reach the OR, the number is [phone].     ?  [place], [place], [place] [address]    There is a surgery drop off/pick up parking lot located on the [place] side of the building. You may park in any spot except ones marked as reserved. You are only allowed to stay in these spots for 20 minutes while you drop off or pick up surgery patients.  Please take elevator on the south side of the building (back side) up to 3rd floor and check in at front desk.  Maps available at [email or web address]              PARKING    Patient Parking: With a disabled placard, parking is free in any patient or visitor spot.   Self-parking is available in the parking lot across from the [facility]), as well as in the [employer] in the numbered Patient/Visitor parking spaces. Rate is $5 for half-day (up to four hours) and $10 for full day parking (longer than four hours). The machines accept credit card or cash (exact bills required $1 or $5 only and the machine does not give change).   Note your parking space number and pay at the pay stations using cash or credit card. You do NOT need to display your ticket on your dashboard. $10 parking allows you in-and-out privileges for 24 hours. Just park in any visitor spot when you return and display the ticket on the dashboard of your car.       QUESTIONS    If you have any questions between now and the day of your surgery, please call the [place] at [phone].          MEDICATION INSTRUCTIONS BEFORE SURGERY/PROCEDURE     Take your regularly prescribed medications as scheduled with a small sip of water on the morning of surgery       AFTER YOUR VISIT WITH [place], IF  YOU START TAKING A NEW MEDICATION BEFORE SURGERY, PLEASE CALL us TO MAKE SURE IT IS SAFE TO TAKE & WILL NOT AFFECT YOUR SURGERY.          EATING/DRINKING     DO NOT EAT ANY SOLID FOODS OR DRINK ANY LIQUIDS AFTER MIDNIGHT ON THE DAY OF SURGERY. Please no candy, mints, or gum.   Clear liquids (water, black coffee or black tea, soda or clear apple juice) are allowed up to 3 hours prior to your surgery.      On The Day of Your Surgery:     You MUST bring proof of identification (with picture) on the day of surgery: valid drivers license ID Card, or passport.  Your insurance card is not needed.    Please wear clean loose-fitting clothes with a button or zipper top (no t-shirts) and leave jewelry and valuables at home.  Shower prior to surgery, but do not apply any lotion, oil, or cream to your face or chest. No perfume, aftershave, or makeup (foundation, blush, powder, eyeliner, eyeshadow, mascara, or lipstick).   Do not wear contact lenses.   Follow instructions above for medications.   Please bring a pair of dark  glasses with you on the day of surgery. If you would like to purchase the postop kit ( dark glasses, extra eye shield and tape ) this is available at the [facility] front desk. The cost for the postop kit is $ 10.00 ( cash only.)   Check in at the location above.   If you are a woman of child bearing age, please note that you may be asked to give a urine sample upon check-in.   You will meet your anesthesia and surgery teams in the preoperative holding area before surgery.   Once surgery is over, you will wake up in the recovery room. You will be given something to drink and allowed to rest.   Once your time in the recovery room is complete, your post-operative instructions will be reviewed with you and your support person.   Before you leave, you will be given a followup appointment with your surgeon.  If you are going home after your surgery, please make sure to arrange for an adult to drive  you home. You CANNOT drive yourself home.     Medications prescribed today:              Ofloxacin eye drops (antibiotic) 1 drop on surgical eye 4x/day            -  Refills #3    Atropine 1% (dilator) 1 drop ton surgical eye 2 x/day    Prednisolone eye drops (steroid) - 1 drop on surgical eye 4x/day            - Refills #3      You will start using all these eye drops after surgery.  Wait 5 minutes between drops.  Continue to use all of your regular drops (if you have any) up to and including the morning of surgery.  You will be given written instructions at your day 1 post op visit that will explain how long to continue each eye drop.      If you have any questions regarding these medications, call Madlyn Frankel, NP at [phone]                                                   Email: [email or web address]    After Surgery:    If you have any questions after your surgery call [phone] between 7am and 5pm. If you have urgent questions after hours or on the weekend, call [phone] and ask for the ophthalmologist on call.     Your medical records are available to you via mychart at [email or web address]

## 2020-09-24 ENCOUNTER — Encounter (INDEPENDENT_AMBULATORY_CARE_PROVIDER_SITE_OTHER): Payer: Self-pay | Admitting: Occupational Health

## 2020-09-24 ENCOUNTER — Ambulatory Visit: Payer: Medicare Other | Admitting: Occupational Health

## 2020-09-24 ENCOUNTER — Encounter (INDEPENDENT_AMBULATORY_CARE_PROVIDER_SITE_OTHER): Payer: Self-pay | Admitting: Family Practice

## 2020-09-24 DIAGNOSIS — H2511 Age-related nuclear cataract, right eye: Secondary | ICD-10-CM

## 2020-09-24 MED ORDER — OFLOXACIN 0.3 % OP SOLN
OPHTHALMIC | 3 refills | Status: DC
Start: 2020-09-24 — End: 2021-03-10

## 2020-09-24 MED ORDER — ATROPINE SULFATE 1 % OP SOLN
OPHTHALMIC | 3 refills | Status: DC
Start: 2020-09-24 — End: 2021-03-10

## 2020-09-24 MED ORDER — PREDNISOLONE ACETATE 1 % OP SUSP
OPHTHALMIC | 3 refills | Status: DC
Start: 2020-09-24 — End: 2021-03-10

## 2020-09-24 NOTE — H&P (Signed)
Last edited 09/24/20 0924 by Bergen Melle E, NP  Date of Service 09/24/20 0637  Status: Addendum             ANESTHESIA PRE-OPERATIVE EVALUATION    Patient Information    Name: Debra Bolton    MRN: [id number]    DOB: 01/11/1961    Age: 60 year old    Sex: female  Procedure(s):  PHACOEMULSIFICATION, CATARACT, WITH IOL IMPLANT OD      Pre-op Vitals:   There were no vitals taken for this visit.        Primary language spoken:  English    ROS/Medical History:      History of Present Illness: Diagnosis: Nuclear sclerotic cataract of right eye    Day of Surgery: 09/26/20    Anesthsia: MAC      General:  negative for Obesity,  has not fallen in the last 6 months,  This is a phone interview    Patient lives with partner    Activity level: walks 5 miles/day, yoga 3 x week    Independent with all ADLs. Has balance issues and is currently getting PHYSICAL THERAPY      Stated Height: 5'4"    Stated Weight:144 lbs     BMI: 24.75 based on stated height and weight    Cardiovascular:  no CAD/Angina/MI/CABG/Stents, Anti-platelet drugs: No,   no hypertension,  no dysrhythmias,  no pacemaker,  No activity related chest pain or SOB. Denies palpitations.    Followed by [facility] PCP, Dr. Edi  Last seen 07/23/20    10/05/19 2 ECHO  LV EF: 77%    06/14/19 EKG NSR 87   Anesthesia History:  history of anesthetic complications (Last colonoscopy at [place], 2013, she was very aggitated during the sx, but she did not remember anything ),  PONV (Nausea with anesthesia),  no history of difficult IV access,  chronic pain patient (Nerve pain: legs and back o rall over. Hx of MS),  no family history of anesthetic complications,   Pulmonary:   asthma,  no sleep apnea,  no recent URI/pneumonia,  Episodes of SOB with exertional exercise, Albuterol PRN, last used 1 month ago. Patient is speaking in full sentences.     Followed by [facility] with Pulmonologist, Khiami, MD [place]     Last seen: 04/07/20    Recent pulmonary function test negative    Just had a cardio Calcium Study - waiting for the result         Neuro/Psych:   negative for TIA/CVA,  no seizures,  neuromuscular disease,  psychiatric history,  Hx of MS  Stable  Retuximab,  Baclofen, Amantadine, Lioresal, Valium, Cymbalta, Wellbutrin, Nortriptyline, Zaniflex - also used for anxiety and PTSD, which are controlled.      Followed by [facility]  Kinkel, Revere Philip, MD  Last seen: 06/01/19     Hx: Depression and PTSD - stable   Followed by [facility]  Jennings, Charles [place], NP  Last seen 04/2020 Hematology/Oncology:   hematologic/lymphatic negative  no history of cancer,      GI/Hepatic:  no GERD,  no pancreatic disease,  no bowel obstruction,   Infectious Disease:  no hepatitis,  no tuberculosis,  Previous COVID-19 dx: 11/2019, residual symptoms, SOB with exertional exercise, Albuterol prn. Last used 1 month ago     COVID Vaccine:   [employer] x 3 in Epic        Renal:  negative renal ROS  09/23/2020   Creatinine:   0.82  GFR: >60   Endocrine/Other:  history of thyroid disease (Hypo),  arthritis (Bil hands ),   back pain (LBP with radiating into the right leg. ABle to lie flat with pillows under her kness),  07/24/2020  Free T4: 0.85 (L)  TSH: 0.97      09/23/2020   Creatinine: 0.82  GFR: >60    04/02/2020   Glycated Hemoglobin: 5.4     Pregnancy History:   Pediatrics:         Pre Anesthesia Testing (PCC/CPC) notes/comments:                          Patient to be driven home/assisted by partner, Legrand Como   Pre-op instructions given via my chart                Physical Exam    Airway:      Cardiovascular:      Pulmonary:      Neuro/Neck/Skeletal/Skin:      Dental:      Abdominal:          Additional Clinical Notes:   Other Physical Exam Findings: No physical exam. This is a phone interview.              Last  OSA (STOP BANG) Score:  No data recorded    Last OSA  (STOP) Score for   No data recorded                 Past Medical History:   Diagnosis Date    Asthma 1991    with pregnancy    Hypothyroidism      Major depressive disorder, single episode     MS (multiple sclerosis) (CMS-HCC)      Past Surgical History:   Procedure Laterality Date    leep      for cervical dysplasia    LYMPH NODE BIOPSY      IgG4 related disease    TONSILLECTOMY AND ADENOIDECTOMY      only tonsillectomy     Social History     Socioeconomic History    Marital status: Divorced   Tobacco Use    Smoking status: Former Smoker     Packs/day: 1.00     Years: 15.00     Pack years: 15.00     Types: Cigarettes     Quit date: 01/27/2016     Years since quitting: 4.6    Smokeless tobacco: Never Used   Substance and Sexual Activity    Alcohol use: Yes     Alcohol/week: 2.0 standard drinks     Types: 2 Glasses of wine per week    Drug use: Never    Sexual activity: Yes     Partners: Male     Birth control/protection: Rhythm   Other Topics Concern    Military Service No    Blood Transfusions No    Caffeine Concern No    Occupational Exposure No    Hobby Hazards No    Sleep Concern No    Stress Concern No    Weight Concern No    Special Diet No    Back Care No    Exercises Regularly Yes    Bike Helmet Use Yes    Seat Belt Use Yes    Performs Self-Exams Yes   Social History Narrative    Moved from [place] is great, high fiber, she is very conscientious  of effect of diet on health.    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    Hasn't had a cigarette in 8 months.    Has smoked 26 years, less than a pack a day.    Currently drinks 2-4 drinks a day on the weekends, total 4-8 per week.    Sexually active with one female partner.    Currently not working, has been on disability for most of the time from 1992 to now.     Alcohol Use: Not At Risk    Frequency of Alcohol Consumption: 2-4 times a month    Average Number of Drinks: 1 or 2    Frequency of Binge Drinking: Never       Current Outpatient Medications   Medication Sig Dispense Refill    albuterol (VENTOLIN HFA) 108 (90 Base) MCG/ACT inhaler Inhale 2 puffs by mouth every 6 hours as  needed for Wheezing. 3 each 1    Amantadine HCl 100 MG tablet Take 100 mg by mouth 2 times daily. Takes PRN 60 tablet 0    baclofen (LIORESAL) 10 MG tablet TAKE 1 TABLET BY MOUTH EVERY DAY NIGHTLY 30 tablet 11    buPROPion (WELLBUTRIN) 300 MG Extended-Release tablet Take 1 tablet (300 mg) by mouth every morning. 90 tablet 1    diazepam (VALIUM) 5 MG tablet Take 1 tablet (5 mg) by mouth daily as needed for Anxiety. 7 tablet 0    DULoxetine (CYMBALTA) 60 MG CR capsule Take 2 capsules (120 mg) by mouth daily. 180 capsule 11    gabapentin (NEURONTIN) 300 MG capsule Take 2 capsules (600 mg) by mouth nightly. 60 capsule 5    nortriptyline (PAMELOR) 25 MG capsule Take 2 capsules (50 mg) by mouth nightly. 180 capsule 3    RITUXIMAB IV Every 6 months.      thyroid (ARMOUR THYROID) 15 MG tablet Take 1 tablet (15 mg) by mouth every other day. 45 tablet 2    thyroid (ARMOUR THYROID) 60 MG tablet Take 1 tablet (60 mg) by mouth daily. 90 tablet 2    tiZANidine (ZANAFLEX) 4 MG tablet Take 1 tablet (4 mg) by mouth at bedtime. 90 tablet 1     No current facility-administered medications for this visit.     Allergies   Allergen Reactions    Penicillins Rash    Modafinil Diarrhea and Nausea and Vomiting       Labs and Other Data  Lab Results   Component Value Date    NA 142 09/23/2020    K 3.8 09/23/2020    CL 104 09/23/2020    BICARB 26 09/23/2020    BUN 9 09/23/2020    CREAT 0.82 09/23/2020    GLU 93 09/23/2020    CA 9.9 09/23/2020     Lab Results   Component Value Date    AST 21 09/23/2020    ALT 18 09/23/2020    ALK 56 09/23/2020    TP 7.2 09/23/2020    ALB 4.4 09/23/2020    TBILI 0.35 09/23/2020     Lab Results   Component Value Date    WBC 6.2 09/23/2020    RBC 4.13 09/23/2020    HGB 12.2 09/23/2020    HCT 38.1 09/23/2020    MCV 92.3 09/23/2020    MCHC 32.0 09/23/2020    RDW 11.9 (L) 09/23/2020    PLT 271 09/23/2020    MPV 9.5 09/23/2020    SEG 54 09/23/2020    LYMPHS 30  09/23/2020    MONOS 11 09/23/2020    EOS 5  09/23/2020    BASOS 1 09/23/2020     Lab Results   Component Value Date    INR 1.0 06/18/2019    PTT 33 06/18/2019     No results found for: ARTPH, ARTPO2, ARTPCO2    Anesthesia Plan:  Comments: (NP note dated 09/24/20  Pre-op instructions given via my chart    NOTE: [place] x 3 in Epic    ANESTHESIA NOTE: Last colonoscopy at [place], 2013, she was very agitated during the procedure, but she did not remember anything   )             Revision History       Date/Time User Provider Type Action    > 09/24/2020  9:24 AM Mark Hassey, Bernarda Caffey, NP Nurse Practitioner Addend     09/24/2020  6:55 AM Chianti Goh, Bernarda Caffey, NP Nurse Practitioner Sign

## 2020-09-24 NOTE — Anesthesia Preprocedure Evaluation (Addendum)
ANESTHESIA PRE-OPERATIVE EVALUATION    Patient Information    Name: Debra Bolton    MRN: [id number]    DOB: 1960/08/20    Age: 60 year old    Sex: female  Procedure(s):  PHACOEMULSIFICATION, CATARACT, WITH IOL IMPLANT OD      Pre-op Vitals:   There were no vitals taken for this visit.        Primary language spoken:  English    ROS/Medical History:      History of Present Illness: Diagnosis: Nuclear sclerotic cataract of right eye    Day of Surgery: 09/26/20    Anesthsia: MAC      General:  negative for Obesity,  has not fallen in the last 6 months,  This is a phone interview    Patient lives with partner    Activity level: walks 5 miles/day, yoga 3 x week    Independent with all ADLs. Has balance issues and is currently getting PHYSICAL THERAPY      Stated Height: 5'4"    Stated Weight:144 lbs     BMI: 24.75 based on stated height and weight    Cardiovascular:  no CAD/Angina/MI/CABG/Stents, Anti-platelet drugs: No,   no hypertension,  no dysrhythmias,  no pacemaker,  No activity related chest pain or SOB. Denies palpitations.    Followed by [facility] PCP, Dr. Rana Snare  Last seen 07/23/20    10/05/19 2 ECHO  LV EF: 77%    06/14/19 EKG NSR 87   Anesthesia History:  history of anesthetic complications (Last colonoscopy at [place], 2013, she was very aggitated during the sx, but she did not remember anything ),  PONV (Nausea with anesthesia),  no history of difficult IV access,  chronic pain patient (Nerve pain: legs and back o rall over. Hx of MS),  no family history of anesthetic complications,   Pulmonary:   asthma,  no sleep apnea,  no recent URI/pneumonia,  Episodes of SOB with exertional exercise, Albuterol PRN, last used 1 month ago. Patient is speaking in full sentences.     Followed by [facility] with Pulmonologist, Khiami, [place]     Last seen: 04/07/20    Recent pulmonary function test negative   Just had a cardio Calcium Study - waiting for the result         Neuro/Psych:   negative for TIA/CVA,  no  seizures,  neuromuscular disease,  psychiatric history,  Hx of MS  Stable  Retuximab,  Baclofen, Amantadine, Lioresal, Valium, Cymbalta, Wellbutrin, Nortriptyline, Zaniflex - also used for anxiety and PTSD, which are controlled.      Followed by [facility]  Arelia Sneddon, MD  Last seen: 06/01/19     Hx: Depression and PTSD - stable   Followed by [facility]  Areta Haber, NP  Last seen 04/2020 Hematology/Oncology:   hematologic/lymphatic negative  no history of cancer,      GI/Hepatic:  no GERD,  no pancreatic disease,  no bowel obstruction,   Infectious Disease:  no hepatitis,  no tuberculosis,  Previous COVID-19 dx: 11/2019, residual symptoms, SOB with exertional exercise, Albuterol prn. Last used 1 month ago     COVID Vaccine:   [place] x 3 in Epic        Renal:  negative renal ROS  09/23/2020   Creatinine: 0.82  GFR: >60   Endocrine/Other:  history of thyroid disease (Hypo),  arthritis (Bil hands ),   back pain (LBP with radiating into the right leg. ABle  to lie flat with pillows under her kness),  07/24/2020  Free T4: 0.85 (L)  TSH: 0.97      09/23/2020   Creatinine: 0.82  GFR: >60    04/02/2020   Glycated Hemoglobin: 5.4     Pregnancy History:   Pediatrics:         Pre Anesthesia Testing (PCC/CPC) notes/comments:                          Patient to be driven home/assisted by partner, Legrand Como   Pre-op instructions given via my chart                Physical Exam    Airway:    Inter-inciser distance 3-4 cm  Prognanth Able    Mallampati: II  TM distance: 5-6 cm            Cardiovascular:  - cardiovascular exam normal         Pulmonary:  - pulmonary exam normal           Neuro/Neck/Skeletal/Skin:      Dental:      Abdominal:          Additional Clinical Notes:   Other Physical Exam Findings: No physical exam. This is a phone interview.              Last  OSA (STOP BANG) Score:  No data recorded    Last OSA  (STOP) Score for   No data recorded                 Past Medical History:   Diagnosis Date     Asthma 1991    with pregnancy    Hypothyroidism     Major depressive disorder, single episode     MS (multiple sclerosis) (CMS-HCC)      Past Surgical History:   Procedure Laterality Date    leep      for cervical dysplasia    LYMPH NODE BIOPSY      IgG4 related disease    TONSILLECTOMY AND ADENOIDECTOMY      only tonsillectomy     Social History     Socioeconomic History    Marital status: Divorced   Tobacco Use    Smoking status: Former Smoker     Packs/day: 1.00     Years: 15.00     Pack years: 15.00     Types: Cigarettes     Quit date: 01/27/2016     Years since quitting: 4.6    Smokeless tobacco: Never Used   Substance and Sexual Activity    Alcohol use: Yes     Alcohol/week: 2.0 standard drinks     Types: 2 Glasses of wine per week    Drug use: Never    Sexual activity: Yes     Partners: Male     Birth control/protection: Rhythm   Other Topics Concern    Military Service No    Blood Transfusions No    Caffeine Concern No    Occupational Exposure No    Hobby Hazards No    Sleep Concern No    Stress Concern No    Weight Concern No    Special Diet No    Back Care No    Exercises Regularly Yes    Bike Helmet Use Yes    Seat Belt Use Yes    Performs Self-Exams Yes   Social History Narrative    Moved  from [place] is great, high fiber, she is very conscientious of effect of diet on health.    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    Hasn't had a cigarette in 8 months.    Has smoked 26 years, less than a pack a day.    Currently drinks 2-4 drinks a day on the weekends, total 4-8 per week.    Sexually active with one female partner.    Currently not working, has been on disability for most of the time from 1992 to now.     Alcohol Use: Not At Risk    Frequency of Alcohol Consumption: 2-4 times a month    Average Number of Drinks: 1 or 2    Frequency of Binge Drinking: Never       Current Outpatient Medications   Medication Sig Dispense Refill    albuterol (VENTOLIN HFA) 108 (90 Base) MCG/ACT  inhaler Inhale 2 puffs by mouth every 6 hours as needed for Wheezing. 3 each 1    Amantadine HCl 100 MG tablet Take 100 mg by mouth 2 times daily. Takes PRN 60 tablet 0    baclofen (LIORESAL) 10 MG tablet TAKE 1 TABLET BY MOUTH EVERY DAY NIGHTLY 30 tablet 11    buPROPion (WELLBUTRIN) 300 MG Extended-Release tablet Take 1 tablet (300 mg) by mouth every morning. 90 tablet 1    diazepam (VALIUM) 5 MG tablet Take 1 tablet (5 mg) by mouth daily as needed for Anxiety. 7 tablet 0    DULoxetine (CYMBALTA) 60 MG CR capsule Take 2 capsules (120 mg) by mouth daily. 180 capsule 11    gabapentin (NEURONTIN) 300 MG capsule Take 2 capsules (600 mg) by mouth nightly. 60 capsule 5    nortriptyline (PAMELOR) 25 MG capsule Take 2 capsules (50 mg) by mouth nightly. 180 capsule 3    RITUXIMAB IV Every 6 months.      thyroid (ARMOUR THYROID) 15 MG tablet Take 1 tablet (15 mg) by mouth every other day. 45 tablet 2    thyroid (ARMOUR THYROID) 60 MG tablet Take 1 tablet (60 mg) by mouth daily. 90 tablet 2    tiZANidine (ZANAFLEX) 4 MG tablet Take 1 tablet (4 mg) by mouth at bedtime. 90 tablet 1     No current facility-administered medications for this visit.     Allergies   Allergen Reactions    Penicillins Rash    Modafinil Diarrhea and Nausea and Vomiting       Labs and Other Data  Lab Results   Component Value Date    NA 142 09/23/2020    K 3.8 09/23/2020    CL 104 09/23/2020    BICARB 26 09/23/2020    BUN 9 09/23/2020    CREAT 0.82 09/23/2020    GLU 93 09/23/2020    [place] 9.9 09/23/2020     Lab Results   Component Value Date    AST 21 09/23/2020    ALT 18 09/23/2020    ALK 56 09/23/2020    TP 7.2 09/23/2020    ALB 4.4 09/23/2020    TBILI 0.35 09/23/2020     Lab Results   Component Value Date    WBC 6.2 09/23/2020    RBC 4.13 09/23/2020    HGB 12.2 09/23/2020    HCT 38.1 09/23/2020    MCV 92.3 09/23/2020    MCHC 32.0 09/23/2020    RDW 11.9 (L) 09/23/2020    PLT 271 09/23/2020  MPV 9.5 09/23/2020    SEG 54 09/23/2020    LYMPHS  30 09/23/2020    MONOS 11 09/23/2020    EOS 5 09/23/2020    BASOS 1 09/23/2020     Lab Results   Component Value Date    INR 1.0 06/18/2019    PTT 33 06/18/2019     No results found for: ARTPH, ARTPO2, ARTPCO2    Anesthesia Plan:  Risks and Benefits of Anesthesia  I have personally performed an appropriate pre-anesthesia physical exam of the patient (including heart, lungs, and airway) prior to the anesthetic and reviewed the pertinent medical history, drug and allergy history, laboratory and imaging studies and consultations.   I have determined that the patient has had adequate assessment and testing.  I have validated the documentation of these elements of the patient exam and/or have made necessary changes to reflect my own observations during my pre-anesthesia exam.  Anesthetic techniques, invasive monitors, anesthetic drugs for induction, maintenance and post-operative analgesia, risks and alternatives have been explained to the patient and/or patient's representatives.    I have prescribed the anesthetic plan:         Planned anesthesia method: Monitored Anesthesia Care         ASA 3 (Severe systemic disease)     Potential anesthesia problems identified and risks including but not limited to the following were discussed with patient and/or patient's representative: Adverse or allergic drug reaction        Planned monitoring method: Routine monitoring  Comments: (NP note dated 09/24/20  Pre-op instructions given via my chart    NOTE: [place] x 3 in Epic    ANESTHESIA NOTE: Last colonoscopy at [place], 2013, she was very agitated during the procedure, but she did not remember anything   )    Informed Consent:  Anesthetic plan and risks discussed with Patient.

## 2020-09-24 NOTE — Telephone Encounter (Signed)
From: Debra Bolton  To: Christin Bach, MD  Sent: 09/24/2020 3:08 PM PDT  Subject: [facility] labs    Dear Dr. Rana Snare,   I am so glad I have a telephone appt. with you on the 15th. Looks as if we have a lot to discuss. I am sending an email because I had labs drawn and I did not see any from you, or the labs Dr. Parks Ranger ordered. I have cataract surgery on the 10th,is there any way we can set the labs up for that morning ?   Thanks Debra Bolton

## 2020-09-24 NOTE — H&P (View-Only) (Signed)
Last edited 09/24/20 6063 by Vela Prose, NP  Date of Service 09/24/20 [phone]  Status: Addendum             ANESTHESIA PRE-OPERATIVE EVALUATION    Patient Information    Name: Debra Bolton    MRN: [id number]    DOB: 1960-08-02    Age: 60 year old    Sex: female  Procedure(s):  PHACOEMULSIFICATION, CATARACT, WITH IOL IMPLANT OD      Pre-op Vitals:   There were no vitals taken for this visit.        Primary language spoken:  English    ROS/Medical History:      History of Present Illness: Diagnosis: Nuclear sclerotic cataract of right eye    Day of Surgery: 09/26/20    Anesthsia: MAC      General:  negative for Obesity,  has not fallen in the last 6 months,  This is a phone interview    Patient lives with partner    Activity level: walks 5 miles/day, yoga 3 x week    Independent with all ADLs. Has balance issues and is currently getting PHYSICAL THERAPY      Stated Height: 5'4"    Stated Weight:144 lbs     BMI: 24.75 based on stated height and weight    Cardiovascular:  no CAD/Angina/MI/CABG/Stents, Anti-platelet drugs: No,   no hypertension,  no dysrhythmias,  no pacemaker,  No activity related chest pain or SOB. Denies palpitations.    Followed by [facility], Dr. Rana Snare  Last seen 07/23/20    10/05/19 2 ECHO  LV EF: 77%    06/14/19 EKG NSR 87   Anesthesia History:  history of anesthetic complications (Last colonoscopy at [place], 2013, she was very aggitated during the sx, but she did not remember anything ),  PONV (Nausea with anesthesia),  no history of difficult IV access,  chronic pain patient (Nerve pain: legs and back o rall over. Hx of MS),  no family history of anesthetic complications,   Pulmonary:   asthma,  no sleep apnea,  no recent URI/pneumonia,  Episodes of SOB with exertional exercise, Albuterol PRN, last used 1 month ago. Patient is speaking in full sentences.     Followed by [facility] with Pulmonologist, [place], [place]     Last seen: 04/07/20    Recent pulmonary function test negative    Just had a cardio Calcium Study - waiting for the result         Neuro/Psych:   negative for TIA/CVA,  no seizures,  neuromuscular disease,  psychiatric history,  Hx of MS  Stable  Retuximab,  Baclofen, Amantadine, Lioresal, Valium, Cymbalta, Wellbutrin, Nortriptyline, Zaniflex - also used for anxiety and PTSD, which are controlled.      Followed by [facility]  Arelia Sneddon, MD  Last seen: 06/01/19     Hx: Depression and PTSD - stable   Followed by [facility]  Areta Haber, NP  Last seen 04/2020 Hematology/Oncology:   hematologic/lymphatic negative  no history of cancer,      GI/Hepatic:  no GERD,  no pancreatic disease,  no bowel obstruction,   Infectious Disease:  no hepatitis,  no tuberculosis,  Previous COVID-19 dx: 11/2019, residual symptoms, SOB with exertional exercise, Albuterol prn. Last used 1 month ago     COVID Vaccine:   [place] x 3 in Epic        Renal:  negative renal ROS  09/23/2020   Creatinine:  0.82  GFR: >60   Endocrine/Other:  history of thyroid disease (Hypo),  arthritis (Bil hands ),   back pain (LBP with radiating into the right leg. ABle to lie flat with pillows under her kness),  07/24/2020  Free T4: 0.85 (L)  TSH: 0.97      09/23/2020   Creatinine: 0.82  GFR: >60    04/02/2020   Glycated Hemoglobin: 5.4     Pregnancy History:   Pediatrics:         Pre Anesthesia Testing (PCC/CPC) notes/comments:                          Patient to be driven home/assisted by partner, Legrand Como   Pre-op instructions given via my chart                Physical Exam    Airway:      Cardiovascular:      Pulmonary:      Neuro/Neck/Skeletal/Skin:      Dental:      Abdominal:          Additional Clinical Notes:   Other Physical Exam Findings: No physical exam. This is a phone interview.              Last  OSA (STOP BANG) Score:  No data recorded    Last OSA  (STOP) Score for   No data recorded                 Past Medical History:   Diagnosis Date    Asthma 1991    with pregnancy    Hypothyroidism      Major depressive disorder, single episode     MS (multiple sclerosis) (CMS-HCC)      Past Surgical History:   Procedure Laterality Date    leep      for cervical dysplasia    LYMPH NODE BIOPSY      IgG4 related disease    TONSILLECTOMY AND ADENOIDECTOMY      only tonsillectomy     Social History     Socioeconomic History    Marital status: Divorced   Tobacco Use    Smoking status: Former Smoker     Packs/day: 1.00     Years: 15.00     Pack years: 15.00     Types: Cigarettes     Quit date: 01/27/2016     Years since quitting: 4.6    Smokeless tobacco: Never Used   Substance and Sexual Activity    Alcohol use: Yes     Alcohol/week: 2.0 standard drinks     Types: 2 Glasses of wine per week    Drug use: Never    Sexual activity: Yes     Partners: Male     Birth control/protection: Rhythm   Other Topics Concern    Military Service No    Blood Transfusions No    Caffeine Concern No    Occupational Exposure No    Hobby Hazards No    Sleep Concern No    Stress Concern No    Weight Concern No    Special Diet No    Back Care No    Exercises Regularly Yes    Bike Helmet Use Yes    Seat Belt Use Yes    Performs Self-Exams Yes   Social History Narrative    Moved from [place] is great, high fiber, she is very conscientious  of effect of diet on health.    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    Hasn't had a cigarette in 8 months.    Has smoked 26 years, less than a pack a day.    Currently drinks 2-4 drinks a day on the weekends, total 4-8 per week.    Sexually active with one female partner.    Currently not working, has been on disability for most of the time from 1992 to now.     Alcohol Use: Not At Risk    Frequency of Alcohol Consumption: 2-4 times a month    Average Number of Drinks: 1 or 2    Frequency of Binge Drinking: Never       Current Outpatient Medications   Medication Sig Dispense Refill    albuterol (VENTOLIN HFA) 108 (90 Base) MCG/ACT inhaler Inhale 2 puffs by mouth every 6 hours as  needed for Wheezing. 3 each 1    Amantadine HCl 100 MG tablet Take 100 mg by mouth 2 times daily. Takes PRN 60 tablet 0    baclofen (LIORESAL) 10 MG tablet TAKE 1 TABLET BY MOUTH EVERY DAY NIGHTLY 30 tablet 11    buPROPion (WELLBUTRIN) 300 MG Extended-Release tablet Take 1 tablet (300 mg) by mouth every morning. 90 tablet 1    diazepam (VALIUM) 5 MG tablet Take 1 tablet (5 mg) by mouth daily as needed for Anxiety. 7 tablet 0    DULoxetine (CYMBALTA) 60 MG CR capsule Take 2 capsules (120 mg) by mouth daily. 180 capsule 11    gabapentin (NEURONTIN) 300 MG capsule Take 2 capsules (600 mg) by mouth nightly. 60 capsule 5    nortriptyline (PAMELOR) 25 MG capsule Take 2 capsules (50 mg) by mouth nightly. 180 capsule 3    RITUXIMAB IV Every 6 months.      thyroid (ARMOUR THYROID) 15 MG tablet Take 1 tablet (15 mg) by mouth every other day. 45 tablet 2    thyroid (ARMOUR THYROID) 60 MG tablet Take 1 tablet (60 mg) by mouth daily. 90 tablet 2    tiZANidine (ZANAFLEX) 4 MG tablet Take 1 tablet (4 mg) by mouth at bedtime. 90 tablet 1     No current facility-administered medications for this visit.     Allergies   Allergen Reactions    Penicillins Rash    Modafinil Diarrhea and Nausea and Vomiting       Labs and Other Data  Lab Results   Component Value Date    NA 142 09/23/2020    K 3.8 09/23/2020    CL 104 09/23/2020    BICARB 26 09/23/2020    BUN 9 09/23/2020    CREAT 0.82 09/23/2020    GLU 93 09/23/2020    CA 9.9 09/23/2020     Lab Results   Component Value Date    AST 21 09/23/2020    ALT 18 09/23/2020    ALK 56 09/23/2020    TP 7.2 09/23/2020    ALB 4.4 09/23/2020    TBILI 0.35 09/23/2020     Lab Results   Component Value Date    WBC 6.2 09/23/2020    RBC 4.13 09/23/2020    HGB 12.2 09/23/2020    HCT 38.1 09/23/2020    MCV 92.3 09/23/2020    MCHC 32.0 09/23/2020    RDW 11.9 (L) 09/23/2020    PLT 271 09/23/2020    MPV 9.5 09/23/2020    SEG 54 09/23/2020    LYMPHS 30  09/23/2020    MONOS 11 09/23/2020    EOS 5  09/23/2020    BASOS 1 09/23/2020     Lab Results   Component Value Date    INR 1.0 06/18/2019    PTT 33 06/18/2019     No results found for: ARTPH, ARTPO2, ARTPCO2    Anesthesia Plan:  Comments: (NP note dated 09/24/20  Pre-op instructions given via my chart    NOTE: [place] x 3 in Epic    ANESTHESIA NOTE: Last colonoscopy at [place], 2013, she was very agitated during the procedure, but she did not remember anything   )             Revision History       Date/Time User Provider Type Action    > 09/24/2020  9:24 AM Teshaun Olarte, Bernarda Caffey, NP Nurse Practitioner Addend     09/24/2020  6:55 AM Juelle Dickmann, Bernarda Caffey, NP Nurse Practitioner Sign

## 2020-09-25 LAB — IGG SUBCLASS PANEL, BLOOD
IGG Subclass 1: 430 mg/dL (ref 240–1118)
IGG Subclass 2: 269 mg/dL (ref 124–549)
IGG Subclass 3: 8 mg/dL — ABNORMAL LOW (ref 21–134)
IGG Subclass 4: 132 mg/dL — ABNORMAL HIGH (ref 1–123)

## 2020-09-25 NOTE — Result Encounter Note (Signed)
Coronary calcifications <100. Will discuss statin therapy next visit.

## 2020-09-26 ENCOUNTER — Ambulatory Visit
Admission: RE | Admit: 2020-09-26 | Discharge: 2020-09-26 | Disposition: A | Discharge status: Home / Self Care | Payer: Medicare Other | Attending: Ophthalmology | Admitting: Ophthalmology

## 2020-09-26 ENCOUNTER — Ambulatory Visit (HOSPITAL_BASED_OUTPATIENT_CLINIC_OR_DEPARTMENT_OTHER): Payer: Medicare Other | Admitting: Anesthesiology

## 2020-09-26 ENCOUNTER — Encounter (HOSPITAL_BASED_OUTPATIENT_CLINIC_OR_DEPARTMENT_OTHER): Payer: Self-pay | Admitting: Ophthalmology

## 2020-09-26 ENCOUNTER — Encounter (HOSPITAL_BASED_OUTPATIENT_CLINIC_OR_DEPARTMENT_OTHER): Admission: RE | Disposition: A | Discharge status: Home Health | Payer: Self-pay | Attending: Ophthalmology

## 2020-09-26 ENCOUNTER — Ambulatory Visit (HOSPITAL_BASED_OUTPATIENT_CLINIC_OR_DEPARTMENT_OTHER): Payer: Medicare Other | Admitting: Occupational Health

## 2020-09-26 DIAGNOSIS — E039 Hypothyroidism, unspecified: Secondary | ICD-10-CM | POA: Insufficient documentation

## 2020-09-26 DIAGNOSIS — F418 Other specified anxiety disorders: Secondary | ICD-10-CM | POA: Insufficient documentation

## 2020-09-26 DIAGNOSIS — H25041 Posterior subcapsular polar age-related cataract, right eye: Secondary | ICD-10-CM | POA: Insufficient documentation

## 2020-09-26 DIAGNOSIS — Z880 Allergy status to penicillin: Secondary | ICD-10-CM | POA: Insufficient documentation

## 2020-09-26 DIAGNOSIS — Z8616 Personal history of COVID-19: Secondary | ICD-10-CM | POA: Insufficient documentation

## 2020-09-26 DIAGNOSIS — J45909 Unspecified asthma, uncomplicated: Secondary | ICD-10-CM | POA: Insufficient documentation

## 2020-09-26 DIAGNOSIS — Z87891 Personal history of nicotine dependence: Secondary | ICD-10-CM | POA: Insufficient documentation

## 2020-09-26 DIAGNOSIS — Z79899 Other long term (current) drug therapy: Secondary | ICD-10-CM | POA: Insufficient documentation

## 2020-09-26 DIAGNOSIS — G8929 Other chronic pain: Secondary | ICD-10-CM | POA: Insufficient documentation

## 2020-09-26 DIAGNOSIS — F4310 Post-traumatic stress disorder, unspecified: Secondary | ICD-10-CM | POA: Insufficient documentation

## 2020-09-26 DIAGNOSIS — Z888 Allergy status to other drugs, medicaments and biological substances status: Secondary | ICD-10-CM | POA: Insufficient documentation

## 2020-09-26 DIAGNOSIS — M19042 Primary osteoarthritis, left hand: Secondary | ICD-10-CM | POA: Insufficient documentation

## 2020-09-26 DIAGNOSIS — G35 Multiple sclerosis: Secondary | ICD-10-CM | POA: Insufficient documentation

## 2020-09-26 DIAGNOSIS — H53001 Unspecified amblyopia, right eye: Secondary | ICD-10-CM

## 2020-09-26 DIAGNOSIS — H209 Unspecified iridocyclitis: Secondary | ICD-10-CM

## 2020-09-26 DIAGNOSIS — M19041 Primary osteoarthritis, right hand: Secondary | ICD-10-CM | POA: Insufficient documentation

## 2020-09-26 LAB — COVID-19 BINAXNOW ANTIGEN (POCT): COVID-19 Antigen (POCT): NEGATIVE

## 2020-09-26 SURGERY — PHACOEMULSIFICATION, CATARACT, WITH IOL IMPLANT
Anesthesia: Monitored Anesthesia Care (MAC) | Site: Eye | Laterality: Right | Wound class: Class I (Clean)

## 2020-09-26 MED ORDER — TROPICAMIDE-CYCLOPENTOLATE-PE 1-1-2.5 % OP SOLN
1.0000 [drp] | OPHTHALMIC | Status: AC
Start: 2020-09-26 — End: 2020-09-26
  Administered 2020-09-26 (×4): 1 [drp] via OPHTHALMIC
  Filled 2020-09-26: qty 0.5

## 2020-09-26 MED ORDER — NA HYALUR & NA CHOND-NA HYALUR 0.55-0.5 ML IO KIT
PACK | INTRAOCULAR | Status: DC | PRN
Start: 2020-09-26 — End: 2020-09-26
  Administered 2020-09-26: 1 via INTRAOCULAR

## 2020-09-26 MED ORDER — ACETAMINOPHEN 325 MG PO TABS
650.0000 mg | ORAL_TABLET | Freq: Once | ORAL | Status: DC | PRN
Start: 2020-09-26 — End: 2020-09-26
  Filled 2020-09-26: qty 2

## 2020-09-26 MED ORDER — PROPARACAINE HCL 0.5 % OP SOLN
OPHTHALMIC | Status: AC
Start: 2020-09-26 — End: ?
  Filled 2020-09-26: qty 300

## 2020-09-26 MED ORDER — FENTANYL CITRATE (PF) 100 MCG/2ML IJ SOLN
INTRAMUSCULAR | Status: AC
Start: 2020-09-26 — End: ?
  Filled 2020-09-26: qty 2

## 2020-09-26 MED ORDER — TOBRAMYCIN-DEXAMETHASONE 0.3-0.1 % OP SUSP
OPHTHALMIC | Status: DC | PRN
Start: 2020-09-26 — End: 2020-09-26
  Administered 2020-09-26 (×2): 1 [drp] via OPHTHALMIC

## 2020-09-26 MED ORDER — LACTATED RINGERS IV SOLN
INTRAVENOUS | Status: DC
Start: 2020-09-26 — End: 2020-09-26

## 2020-09-26 MED ORDER — TETRACAINE HCL 0.5 % OP SOLN
OPHTHALMIC | Status: DC | PRN
Start: 2020-09-26 — End: 2020-09-26
  Administered 2020-09-26 (×2): 2 [drp] via TOPICAL

## 2020-09-26 MED ORDER — EPINEPHRINE PF 1 MG/ML IJ SOLN
INTRAOCULAR | Status: DC | PRN
Start: 2020-09-26 — End: 2020-09-26
  Administered 2020-09-26 (×2): 500 mL

## 2020-09-26 MED ORDER — TROPICAMIDE-CYCLOPENTOLATE-PE 1-1-2.5 % OP SOLN
OPHTHALMIC | Status: AC
Start: 2020-09-26 — End: ?
  Filled 2020-09-26: qty 0.5

## 2020-09-26 MED ORDER — NALOXONE HCL 0.4 MG/ML IJ SOLN
20.0000 ug | INTRAMUSCULAR | Status: DC | PRN
Start: 2020-09-26 — End: 2020-09-26
  Filled 2020-09-26: qty 0.05

## 2020-09-26 MED ORDER — MIDAZOLAM HCL 2 MG/2ML IJ SOLN
INTRAMUSCULAR | Status: AC
Start: 2020-09-26 — End: ?
  Filled 2020-09-26: qty 2

## 2020-09-26 MED ORDER — LACTATED RINGERS IV SOLN
INTRAVENOUS | Status: DC | PRN
Start: 2020-09-26 — End: 2020-09-26

## 2020-09-26 MED ORDER — BSS IO SOLN
INTRAOCULAR | Status: DC | PRN
Start: 2020-09-26 — End: 2020-09-26
  Administered 2020-09-26: 15 mL via TOPICAL

## 2020-09-26 MED ORDER — ONDANSETRON HCL 4 MG/2ML IV SOLN
INTRAMUSCULAR | Status: DC | PRN
Start: 2020-09-26 — End: 2020-09-26
  Administered 2020-09-26: 4 mg via INTRAVENOUS

## 2020-09-26 MED ORDER — SODIUM HYALURONATE 10 MG/ML IO SOLN.
INTRAOCULAR | Status: DC | PRN
Start: 2020-09-26 — End: 2020-09-26
  Administered 2020-09-26: 0.55 mL via INTRAOCULAR

## 2020-09-26 MED ORDER — FENTANYL CITRATE (PF) 250 MCG/5ML IJ SOLN
INTRAMUSCULAR | Status: DC | PRN
Start: 2020-09-26 — End: 2020-09-26
  Administered 2020-09-26: 25 ug via INTRAVENOUS
  Administered 2020-09-26: 50 ug via INTRAVENOUS
  Administered 2020-09-26: 25 ug via INTRAVENOUS

## 2020-09-26 MED ORDER — MOXIFLOXACIN HCL 0.5 % OP SOLN
OPHTHALMIC | Status: DC | PRN
Start: 2020-09-26 — End: 2020-09-26
  Administered 2020-09-26: 1 [drp] via OPHTHALMIC

## 2020-09-26 MED ORDER — PROPARACAINE HCL 0.5 % OP SOLN
1.0000 [drp] | Freq: Once | OPHTHALMIC | Status: AC
Start: 2020-09-26 — End: 2020-09-26
  Administered 2020-09-26 (×2): 1 [drp] via OPHTHALMIC
  Filled 2020-09-26: qty 300

## 2020-09-26 MED ORDER — MIDAZOLAM HCL 2 MG/2ML IJ SOLN
INTRAMUSCULAR | Status: DC | PRN
Start: 2020-09-26 — End: 2020-09-26
  Administered 2020-09-26: .5 mg via INTRAVENOUS
  Administered 2020-09-26: 1 mg via INTRAVENOUS
  Administered 2020-09-26: .5 mg via INTRAVENOUS
  Administered 2020-09-26 (×2): 1 mg via INTRAVENOUS

## 2020-09-26 MED ORDER — DORZOLAMIDE-TIMOLOL 2-0.5 % OP SOLN
OPHTHALMIC | Status: DC | PRN
Start: 2020-09-26 — End: 2020-09-26
  Administered 2020-09-26: 1 [drp] via OPHTHALMIC

## 2020-09-26 MED ORDER — LIDOCAINE HCL (PF) 1 % IJ SOLN
INTRAMUSCULAR | Status: DC | PRN
Start: 2020-09-26 — End: 2020-09-26
  Administered 2020-09-26: 1 mL via INTRACAMERAL

## 2020-09-26 MED ORDER — TRYPAN BLUE 0.06 % OP SOLN
OPHTHALMIC | Status: DC | PRN
Start: 2020-09-26 — End: 2020-09-26
  Administered 2020-09-26 (×2): 1 via INTRAOCULAR

## 2020-09-26 SURGICAL SUPPLY — 17 items
CANNULA ANTERIOR CHAMBER 27GA X 3/8", 3MM ANG TIP (Needles/punch/cannula/biopsy) ×2
CANNULA HYDRODISSECTION CHANG (Cannula) ×1
CANNULA HYDRODISSECTION CHANG, 27G X 3/4", 1MM BEND (Cannula) ×1 IMPLANT
CANNULA NASAL FLARED W/7' TUBE (Misc Medical Supply) ×2 IMPLANT
CANNULA OPTHALMIC HYDRODISSECTION 27GA X 7MM (Needles/punch/cannula/biopsy) ×2 IMPLANT
CANNULA WASHOUT 19G (Needles/punch/cannula/biopsy) ×2 IMPLANT
DRESSING TEGADERM HP 2.375" X 2.375" (Dressings/packing) ×2 IMPLANT
GLOVE BIOGEL SUPER-SENSITIVE SIZE 8.5 (Gloves/Gowns) ×2 IMPLANT
GOWN SURGICAL ULTRA XL BLUE, AAMI LVL 3 (Gloves/Gowns) ×4 IMPLANT
IRRIGATOR ANT CHM BND 30GA (Misc Surgical Supply) ×2 IMPLANT
KNIFE SIDEPORT CLEAR CUT 1.0MM DUAL BEVEL ANGLED (Knives/Blades) ×2
LENS INTRAOC SN60WF21.5 (Lens) ×2 IMPLANT
MARKER DEVON UTILITY WITH LABELS- STERILE (Misc Medical Supply) ×2 IMPLANT
PACK CATARACT SISTER TRANSFORMER (Procedure Packs/kits) ×1
PACK CATARACT SISTER TRANSFORMER (SHILEY PHACO PACK) (Procedure Packs/kits) ×1 IMPLANT
SOLUTION IRR POUR BTL H20 500ML (Non-Pharmacy Meds/Solutions) ×2 IMPLANT
SYSTEM MALYUGIN RING 7MM (Kits/Sets/Trays) ×2 IMPLANT

## 2020-09-26 NOTE — Anesthesia Postprocedure Evaluation (Signed)
Anesthesia Post Note    Patient: Debra Bolton    Procedure(s) Performed: Procedure(s):  PHACOEMULSIFICATION, CATARACT, WITH IOL IMPLANT OD      Final anesthesia type: Monitored Anesthesia Care    Patient location: PACU    Post anesthesia pain: adequate analgesia    Mental status: awake, alert  and oriented    Airway Patent: Yes    Last Vitals:   Vitals Value Taken Time   BP 107/71 09/26/20 1515   Temp 36.3 C 09/26/20 1515   Pulse 67 09/26/20 1515   Resp 15 09/26/20 1515   SpO2 98 % 09/26/20 1515        Post vital signs: stable    Hydration: adequate    N/V:no    Anesthetic complications: no    Plan of care per primary team.

## 2020-09-26 NOTE — Op Note (Signed)
PREOPERATIVE DIAGNOSIS: visually significant +3 posterior subcapsular cataract, right eye    POSTOPERATIVE DIAGNOSIS: Same     PROCEDURE PERFORMED: phacoemulsification with insertion of a monofocal intraocular lens into the capsular bag, right eye    SURGEON/STAFF: Rolland Porter, MD, PhD    RESIDENT: Lady Saucier, MD    ANESTHESIA: Monitored Anesthesia Care / topical anesthesia with monitored anesthesia care    ESTIMATED BLOOD LOSS: Minimal    COMPLICATIONS: None.    INDICATIONS FOR PROCEDURE: This patient has a history of visually significant cataract of the right eye. There is also a history of amblyopia in the right eye. The risks, benefits and alternatives to cataract surgery were thoroughly discussed, and the patient elected to proceed with cataract surgery.    PROCEDURE:    The patient was identified in the preoperative area. The risks, benefits, and alternatives were discussed with the patient including but not limited to pain, infection, bleeding, prolonged recovery, inflammation, need for corrective spectacles, need for more surgery, retinal detachment, glaucoma, hypotony, corneal edema/ need for corneal transplant, photophobia, corneal abrasion, ptosis, cataract, scarring, diplopia, loss of circulation to eye, worse vision, blindness, loss of eye. The patient understood and agreed with plan. The operative eye was then marked.    The patient was correctly identified and brought to the operating room at which point a pre-operative sign-in was conducted. She was then given sedation per anesthesia, and the eye was anesthetized with topical tetracaine and lidocaine gel. The eye was then prepped with 5% povidone-iodine and draped in the usual sterile ophthalmic fashion. Following a complete time-out, a lid speculum was placed in the left eye. A temporal paracentesis wound was created at 5 o'clock using a 15 degree supersharp blade and 0.12 forceps. The anterior chamber was irrigated with  epinephrine/lidocaine, and then filled with Amvisc. Next, a temporal clear corneal wound was created using a 2.4 mm keratome blade with a 0.12 forceps for stabilization. A continuous curvilinear capsulorrhexis was performed using a combination of a cystotome needle and Utrata forceps. Hydrodissection was avoided due to the presence of posterior subcapsular cataract. The lens was then removed using phacoemulsification power with a divide-and-conquer technique. The cortical material was removed using irrigation and aspiration. The capsular bag was filled with Healon viscoelastic. The intraocular lens was confirmed as an Alcon SN60WF 21.5 diopter posterior chamber lens (serial number [id number], expiration date 2025-07-21). The posterior chamber intraocular lens was then injected into the capsular bag and rotated into the appropriate position with a Lester hook. The remaining Healon was then removed using irrigation and aspiration. The anterior chamber was filled with balanced salt solution and the wounds were hydrated. At this point the incisions were found to be watertight and the intraocular pressure was normal. The drapes and lid speculum were removed from the eye and the povidone-iodine solution was washed from the skin with sterile saline in a wet-to-dry fashion. Tobradex ointment was placed in the eye, and the eye was shielded.   The patient was instructed to follow-up tomorrow for her first post-operative appointment.   The attending physician was present for the entirety of the surgery and performed all critical steps of the surgery. I certify that the services for which payment is claimed were medically necessary. Renee Pain was not involved in this case except for writing the report.

## 2020-09-26 NOTE — Progress Notes (Signed)
Retina Progress Note     Subjective:     Debra Bolton is a 60 year old year old female patient who presents as a POD1 s/p CE IOL right eye  Patient reports that they slept well overnight, denies pain, kept eye patch on. Patient confirms that they are in receipt of their postoperative drops. Patient feels she vision is already improved.     Assessment and Care Plan:   POD1 s/p CE IOL right eye   - Doing well, IOP acceptable, wound Seidel negative, IOL centered in capsular bag  - prednisolone acetate QID, ofloxacin QID, atropine BID to operative eye  - Endophthalmitis precautions advised  - Wear hard shield  - Activity restrictions reviewed: no heavy lifting, no strenuous activity, no pooled water to the eye  - RTC 1 week (offered patient to be seen 09/30/20 at [facility] but patient declined)      Wende Neighbors, MD  Retina Fellow

## 2020-09-26 NOTE — Op Note (Deleted)
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    INDICATIONS FOR PROCEDURE: This patient has a history of visually significant cataract of the right eye. 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 The risks, benefits and alternatives to cataract surgery were thoroughly discussed, and the patient elected to proceed with cataract surgery.    PROCEDURE:  The patient was identified in the preoperative area. The risks, benefits, and alternatives were discussed with the patient including but not limited to pain, infection, bleeding, prolonged recovery, inflammation, need for corrective spectacles, need for more surgery, retinal detachment, glaucoma, hypotony, corneal edema/ need for corneal transplant, photophobia, corneal abrasion, ptosis, cataract, scarring, diplopia, loss of circulation to eye, worse vision, blindness, loss of eye. The patient understood and agreed with plan. The operative eye was then marked.    The patient was correctly identified and brought to the operating room at which point a pre-operative sign-in was conducted. She was then given sedation per anesthesia, and the eye was anesthetized with topical tetracaine and lidocaine gel. The eye was then prepped with 5% povidone-iodine and draped in the usual sterile ophthalmic fashion. Following a complete time-out, a lid speculum was placed in the right eye. A temporal paracentesis wound was created at 5 o'clock using a 15 degree supersharp blade and  0.12 forceps. The anterior chamber was irrigated with epinephrine/lidocaine, and then filled with Amvisc. Next, a temporal clear corneal wound was created using a 2.4 mm keratome blade with a 0.12 forceps for stabilization. A continuous curvilinear capsulorrhexis was performed using a combination of a cystotome needle and Utrata forceps. Hydrodissection was not performed given the possibility of a posterior polar cataract. The lens was then removed using phacoemulsification power with a divide-and-conquer technique. The cortical material was removed using irrigation and aspiration. The capsular bag was filled with Healon viscoelastic. The intraocular lens was confirmed as an Alcon SN60WF 21.5 diopter posterior chamber lens (serial number [id number], expiration date 2025-07-21). The posterior chamber intraocular lens was then injected into the capsular bag and rotated into the appropriate position with a Lester hook. The remaining Healon was then removed using irrigation and aspiration. The anterior chamber was filled with balanced salt solution and the wounds were hydrated. At this point the incisions were found to be watertight and the intraocular pressure was normal. The drapes and lid speculum were removed from the eye and the povidone-iodine solution was washed from the skin with sterile saline in a wet-to-dry fashion. A drop oftobradex placed in the eye, and the eye was shielded. The patient was instructed to follow-up tomorrow for her first post-operative appointment.   The attending physician was present for the entirety of the surgery and performed all critical steps of the surgery. I certify that the services for which payment is claimed were medically necessary.

## 2020-09-26 NOTE — Discharge Instructions (Addendum)
AFTER YOUR CATARACT SURGERY  Patient Information    You or someone you know has just had retina and/or vitreous surgery.  Before leaving [place] you will be given a return appointment at the [facility].  An appointment card will be given to you as a reminder.      Follow up in 1 day (Saturday) at 10:00 am at the [facility] with Dr. Charleen Kirks.    Please do not arrive earlier than your scheduled time. The appointment may take 60 to 90 minutes.        Please bring all your prescribed drops to the appointment     Please keep patch on    When to Call Your Doctor    If you have any questions or concerns that cannot wait until your post-operative appointment, please call the [facility] (clinic hours and telephone numbers are listed below).  If the clinic is closed, call the [facility] Operator at [phone] and ask to speak to the ophthalmologist (eye surgeon) on call.    Call your doctor if:    You have a deep aching pain in the eye that is not relieved by Tylenol (especially if the pain is associated with nausea).  You suddenly have increased redness, swelling, and/or purulent discharge (pus) from the eye.    Positioning:    No positioning needed    Your diet:    You may return to your normal diet the day of surgery.    First night:    Keep the patch on the eye at all times.  Do not start using any eye drops until after your examination after surgery.    Protection:    Wear protection in front of your eyes at all times for the first 3 weeks (21 days).  Any pair of spectacles or sunglasses will provide sufficient protection.  In place of glasses, during the day you may place the shield over the eye that has just had surgery.  For the first week after surgery, tape the shield over the eye when you prepare to sleep.    Important phone numbers:     [facility]      [phone]    Clinic hours are 8:00 AM to 4:30 PM, Monday through Friday.     [place]     [phone]     [facility]      [phone]    Clinic hours are 8:00 AM to noon on Mondays.    Clinic hours are 8:00 AM to 4:30 PM Tuesday through Friday.     [facility] [phone]    Ask to speak to the ophthalmologist (eye surgeon) on call.

## 2020-09-26 NOTE — Interval H&P Note (Signed)
Date of Service: 09/26/20    Attending Attestation of H&P:  Interval H&P shows no changes compared to the completed H&P on 09/24/20.     I agree with the content of the H&P as stated: yes  I have corrections or additions as indicated below: no    History & Physical - Interval Assessment:    Debra Bolton  [id number]    Current medical status:  Unchanged    Medications/allergies:   Unchanged    Review of symptoms:  Unchanged    Physical examination:  I have examined the patient today.  Unchanged    Laboratory or clinical data:  Unchanged    Modifications of initial care plan:  Unchanged      Lorretta Harp [facility] 09/26/20  12:45 PM

## 2020-09-27 ENCOUNTER — Ambulatory Visit (INDEPENDENT_AMBULATORY_CARE_PROVIDER_SITE_OTHER): Payer: Medicare Other

## 2020-09-27 DIAGNOSIS — H25041 Posterior subcapsular polar age-related cataract, right eye: Secondary | ICD-10-CM

## 2020-09-27 DIAGNOSIS — Z09 Encounter for follow-up examination after completed treatment for conditions other than malignant neoplasm: Secondary | ICD-10-CM

## 2020-09-27 DIAGNOSIS — H209 Unspecified iridocyclitis: Secondary | ICD-10-CM

## 2020-09-29 ENCOUNTER — Encounter (INDEPENDENT_AMBULATORY_CARE_PROVIDER_SITE_OTHER): Payer: Medicare Other | Admitting: Interventional Cardiology

## 2020-09-29 NOTE — Patient Instructions (Signed)
Your surgery is currently scheduled at [facility] on 10/15/20  The scheduler will be contacting you with the check in time                                                                                 [place], [place], [place] [address]  Check in and Operating room is down the elevator on the lower level (LL)    Gay structure, Microbiologist structure, or Dance movement psychotherapist parking (7am-5pm at [employer] entrance and [facility] Social research officer, government; 5am-5pm at [employer] by Emergency room/Labor and Delivery) for same cost as self-parking.  [email or web address]       QUESTIONS    If you have any questions between now and the day of your surgery, please do not hesitate to call:     [facility]: [phone]       DAY OF SURGERY ARRIVAL TIME:    On the day of your Surgery/Procedure, please arrive at the time provided by the surgery/preop team. If you have any questions regarding your arrival time, please call:    [place] SURGERY/PROCEDURE:      Medications to hold prior to surgery: none     OK to take your prescription medications as scheduled with a small sip of water on the morning of surgery.continue all medications     PLEASE HOLD ALL NSAIDS (non-steroidal anti-inflammatory drugs) SUCH AS advil, aleve, motrin, ibuprofen, relafen, lodine, feldene, Diclofenac, voltaren, indomethacin, naproxen, celebrex, Mobic 7 days before surgery.       Please hold vitamins, supplements, herbs & fish oil 7 days before surgery.     It is OK to take acetaminophen (Tylenol) for pain around the time of surgery unless you have liver disease.      AFTER YOUR VISIT WITH [place], IF YOU START TAKING A NEW MEDICATION BEFORE SURGERY, PLEASE CALL us TO MAKE SURE IT IS SAFE TO TAKE & WILL NOT AFFECT YOUR SURGERY.         [facility]  INSTRUCTIONS:     If you use a CPAP machine, please bring the entire machine, including mask and tubing, with you on the day of surgery.          EATING/DRINKING        DO NOT EAT OR DRINK ANYTHING AFTER MIDNIGHT ON THE DAY OF SURGERY    Preparing for your Surgery:     Please wear clean loose-fitting clothes and leave valuables at home   Do not shave or remove body hair. Facial shaving is permitted. If you are having head/face surgery, ask your surgeon's office whether you can shave.  Bring a picture ID and your insurance card, and be prepared to pay your deductible or co-insurance by cash, check, or credit card when you arrive.   All patients [facility] go home after their surgery, Please make sure to arrange for an adult to drive you home. You CANNOT use UBER or LYFT. If you do not have a ride, your surgery may be cancelled.  On The Day of Your Surgery:      Check in at the location mentioned above  COVID testing may be done on arrival to the Pre-op or Procedural areas according to the current [facility] mandate.    If you are a woman of child bearing age, please note that you may be asked to give a urine sample upon check-in  You will meet your anesthesia and surgery teams in the preoperative holding area before surgery.   Once surgery is over, you will wake up in the recovery room.  An adult chaperone will need to stay with you for the first 24 hours after surgery.   Visitor policy during the RCVKF-84 pandemic is subject to change. Current visitor policy can be found at [email or web address]      A video about what to expect for the day of surgery can be found here:    [email or web address]  Or by searching You-tube for [place] before surgery and [place] after surgery     You medical records are available to you at [email or web address] click sign up now.

## 2020-09-30 ENCOUNTER — Ambulatory Visit (INDEPENDENT_AMBULATORY_CARE_PROVIDER_SITE_OTHER): Payer: Medicare Other

## 2020-09-30 ENCOUNTER — Encounter (INDEPENDENT_AMBULATORY_CARE_PROVIDER_SITE_OTHER): Payer: Self-pay | Admitting: Internal Medicine

## 2020-09-30 ENCOUNTER — Encounter (HOSPITAL_BASED_OUTPATIENT_CLINIC_OR_DEPARTMENT_OTHER): Payer: Self-pay | Admitting: Neurology

## 2020-09-30 ENCOUNTER — Ambulatory Visit (INDEPENDENT_AMBULATORY_CARE_PROVIDER_SITE_OTHER): Payer: Medicare Other | Admitting: Nurse Practitioner

## 2020-09-30 ENCOUNTER — Telehealth (INDEPENDENT_AMBULATORY_CARE_PROVIDER_SITE_OTHER): Payer: Medicare Other | Admitting: Internal Medicine

## 2020-09-30 ENCOUNTER — Encounter (INDEPENDENT_AMBULATORY_CARE_PROVIDER_SITE_OTHER): Payer: Self-pay | Admitting: Nurse Practitioner

## 2020-09-30 DIAGNOSIS — G35 Multiple sclerosis: Secondary | ICD-10-CM

## 2020-09-30 DIAGNOSIS — Z79899 Other long term (current) drug therapy: Secondary | ICD-10-CM

## 2020-09-30 DIAGNOSIS — M2550 Pain in unspecified joint: Secondary | ICD-10-CM

## 2020-09-30 DIAGNOSIS — K1120 Sialoadenitis, unspecified: Secondary | ICD-10-CM

## 2020-09-30 DIAGNOSIS — H209 Unspecified iridocyclitis: Secondary | ICD-10-CM

## 2020-09-30 DIAGNOSIS — D849 Immunodeficiency, unspecified: Secondary | ICD-10-CM

## 2020-09-30 DIAGNOSIS — R490 Dysphonia: Secondary | ICD-10-CM

## 2020-09-30 DIAGNOSIS — Z01818 Encounter for other preprocedural examination: Secondary | ICD-10-CM

## 2020-09-30 DIAGNOSIS — R590 Localized enlarged lymph nodes: Secondary | ICD-10-CM

## 2020-09-30 DIAGNOSIS — D8989 Other specified disorders involving the immune mechanism, not elsewhere classified: Secondary | ICD-10-CM

## 2020-09-30 DIAGNOSIS — H269 Unspecified cataract: Secondary | ICD-10-CM

## 2020-09-30 NOTE — Progress Notes (Signed)
Due to the COVID-19 pandemic and high risk of community-acquired infection with patient travel, I am proceeding with this telemedicine evaluation at the request of the patient.     Patient Verification & Telemedicine Consent:    I am proceeding with this evaluation at the direct request of the patient.  I have verified this is the correct patient and have obtained verbal consent and written consent from the patient/ surrogate to perform this voluntary telemedicine evaluation (including obtaining history, performing examination and reviewing data provided by the patient).   The patient/ surrogate has the right to refuse this evaluation.  I have explained risks (including potential loss of confidentiality), benefits, alternatives, and the potential need for subsequent face to face care. Patient/ surrogate understands that there is a risk of medical inaccuracies given that our recommendations will be made based on reported data (and we must therefore assume this information is accurate).  Knowing that there is a risk that this information is not reported accurately, and that the telemedicine video, audio, or data feed may be incomplete, the patient agrees to proceed with evaluation and holds [place] harmless knowing these risks. In this evaluation, we will be providing recommendations only. The patient/ surrogate has been notified that other healthcare professionals (including students, residents and Metallurgist) may be involved in this audio-video evaluation.   All laws concerning confidentiality and patient access to medical records and copies of medical records apply to telemedicine.  The patient/ surrogate has received the [place] Notice of Privacy Practices.  I have reviewed this above verification and consent paragraph with the patient/ surrogate.  If the patient is not capacitated to understand the above, and no surrogate is available, since this is not an emergency evaluation, the visit will be rescheduled  until such time that the patient can consent, or the surrogate is available to consent.    Demographics:   Medical Record #: [id number]   Date: September 30, 2020   Patient Name: Debra Bolton   DOB: 1960/12/04  Age: 60 year old  Sex: female  Location: Home address on file    Evaluator(s):   Debra Bolton was evaluated by me today.    Clinic Location: [facility]  [address] [place]  [place] [address]    Chief Complaint  No chief complaint on file.  1. Multiple Sclerosis, relapsing form  2. Anterior uveitis  3. IgG related lymphadenopathy, chronic sialoadenitis    History of present Illness  60 year old female with history of multiple sclerosis diagnosed 1991, presented with numbness in both legs, seizures of 4 years duration.  Multiple relapses since then.  Previously treated with Copaxone.  Started Natalizumab in 2014 with good response and improvement in pain and numbness in the extremities.  Currently taking Cymbalta and gabapentin which appear to help with pain in her extremities.    History of anterior uveitis in 2018, treated with topical corticosteroids.  Seen by [facility]hthalmology at [facility].     She is a chronic heavy smoker, around 2017, developed change in voice with cervical lymphadenopathy.  Underwent removal of enlarged tonsil and cervical lymph node to rule out malignancy.  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  CT chest abdomen pelvis 08/11/2015 which was negative for lymphadenopathy and for retroperitoneal fibrosis.  No pancreatitis.  Neck ultrasound showed bilateral submandibular gland enlargement with heterogeneous echotexture, likely secondary to  IgG4 related disease    Switched from natalizumab to rituximab for multiple sclerosis, in light of history of uveitis and IgG4 related disease, by Neurology.  Received rituximab 500 mg IV 2 doses 2 weeks apart in March 2019  and September 2019  1 g IV single dose June 2020, April 2021, Feb 2022    Remains on rituximab every 6 months, however delayed last dose d/t pandemic . Tolerating rituximab well.  Overall felt better with respect to energy levels, submandibular pain and swelling after the infusion.  Abdominal pain has resolved.     Recurrence of uveitis in 06/2019, treated with pred forte.  Underwent rituximab April 2021 and February 2022  No recurrence of uveitis  Worsening right eye worsening vision in eye, underwent cataract surgery recently  Stable chronic arthralgias, fatigue, voice hoarseness, submandibular swelling.  No recent worsening  Denies fever and weight loss  No infectious complications  QMVHQ-46 booster dose 03/19/2020    Review of Systems  As per HPI    Medications and Allergies were reviewed with the patient.       Current Outpatient Medications:     albuterol (VENTOLIN HFA) 108 (90 Base) MCG/ACT inhaler, Inhale 2 puffs by mouth every 6 hours as needed for Wheezing., Disp: 3 each, Rfl: 1    Amantadine HCl 100 MG tablet, Take 100 mg by mouth 2 times daily. Takes PRN, Disp: 60 tablet, Rfl: 0    atropine (ISOPTO) 1 % ophthalmic solution, 1 drop to operated eye bid. Use this drop after surgery., Disp: 5 mL, Rfl: 3    baclofen (LIORESAL) 10 MG tablet, TAKE 1 TABLET BY MOUTH EVERY DAY NIGHTLY, Disp: 30 tablet, Rfl: 11    buPROPion (WELLBUTRIN) 300 MG Extended-Release tablet, Take 1 tablet (300 mg) by mouth every morning., Disp: 90 tablet, Rfl: 1    diazepam (VALIUM) 5 MG tablet, Take 1 tablet (5 mg) by mouth daily as needed for Anxiety., Disp: 7 tablet, Rfl: 0    DULoxetine (CYMBALTA) 60 MG CR capsule, Take 2 capsules (120 mg) by mouth daily., Disp: 180 capsule, Rfl: 11    gabapentin (NEURONTIN) 300 MG capsule, Take 2 capsules (600 mg) by mouth nightly., Disp: 60 capsule, Rfl: 5    nortriptyline (PAMELOR) 25 MG capsule, Take 2 capsules (50 mg) by mouth nightly., Disp: 180 capsule, Rfl: 3    ofloxacin (OCUFLOX)  0.3 % ophthalmic solution, 1 drop to operated eye qid. Use this drop after surgery., Disp: 5 mL, Rfl: 3    prednisoLONE acetate (PRED FORTE) 1 % ophthalmic suspension, 1 drop to operated eye qid. Use this drop after surgery., Disp: 5 mL, Rfl: 3    RITUXIMAB IV, Every 6 months., Disp: , Rfl:     thyroid (ARMOUR THYROID) 15 MG tablet, Take 1 tablet (15 mg) by mouth every other day., Disp: 45 tablet, Rfl: 2    thyroid (ARMOUR THYROID) 60 MG tablet, Take 1 tablet (60 mg) by mouth daily., Disp: 90 tablet, Rfl: 2    tiZANidine (ZANAFLEX) 4 MG tablet, Take 1 tablet (4 mg) by mouth at bedtime., Disp: 90 tablet, Rfl: 1    Allergies  Penicillins and Modafinil    Past Medical History:   Diagnosis Date    Asthma 1991    with pregnancy    Hypothyroidism     Major depressive disorder, single episode     MS (multiple sclerosis) (CMS-HCC)        Past Surgical History:   Procedure Laterality Date  leep      for cervical dysplasia    LYMPH NODE BIOPSY      IgG4 related disease    TONSILLECTOMY AND ADENOIDECTOMY      only tonsillectomy       Family History   Problem Relation Name Age of Onset    Other Mother Zara Chess     Thyroid Mother Zara Chess     Heart Disease Mother Velva Harman mcclain         heart failure    Cancer Father don mc clain         lung    Cancer Brother tom mcclain     Heart Disease Brother tom mcclain         heart attack    Cancer Sister Velva Harman mcclain         breast    Diabetes Sister suzanne mcclain     Thyroid Sister suzanne mcclain         car accident    Stroke Brother Don mcclain     Cancer Brother Timmothy Sours mcclain         prostate cancer    Cancer Brother john mc clain         copd lungs    Glaucoma Neg Hx      Macular Degeneration (AMD) Neg Hx       Social History     Socioeconomic History    Marital status: Divorced   Tobacco Use    Smoking status: Former Smoker     Packs/day: 1.00     Years: 15.00     Pack years: 15.00     Types: Cigarettes     Quit date: 01/27/2016     Years since  quitting: 4.6    Smokeless tobacco: Never Used   Substance and Sexual Activity    Alcohol use: Yes     Alcohol/week: 2.0 standard drinks     Types: 2 Glasses of wine per week     Comment: 2 glasses of wine per week    Drug use: Never    Sexual activity: Yes     Partners: Male     Birth control/protection: Rhythm   Other Topics Concern    Military Service No    Blood Transfusions No    Caffeine Concern No    Occupational Exposure No    Hobby Hazards No    Sleep Concern No    Stress Concern No    Weight Concern No    Special Diet No    Back Care No    Exercises Regularly Yes    Bike Helmet Use Yes    Seat Belt Use Yes    Performs Self-Exams Yes   Social History Narrative    Moved from [place] is great, high fiber, she is very conscientious of effect of diet on health.    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    Hasn't had a cigarette in 8 months.    Has smoked 26 years, less than a pack a day.    Currently drinks 2-4 drinks a day on the weekends, total 4-8 per week.    Sexually active with one female partner.    Currently not working, has been on disability for most of the time from 1992 to now.         Physical Exam  There were no vitals filed for this visit.  Gen: Alert, cooperative  Psych: Appropriate affect, pleasant  Neck: No visibly enlarged lymph nodes  Skin: No rheumatologic rashes or ulcers  Eyes: EOMI. No scleral icterus  ENT: No oral mucositis  Resp: Breathing comfortably, no tachypnea  Cardiac: Well perfused extremities, no cyanosis  Joint exam:  No visible joint swelling  Neuro: Oriented to time, place and person         Most recent labs and imaging were reviewed and discussed with the patient.  50 pages of outside records reviewed, outlined in HPI    08/08/2015  IgG4 level 215 upper limit 196m/dl  Total IgG 1050 upper limit 1590    01/12/17  IgG4 level 193  03/29/17:  IgG4 elevated at 205.  Normal IgG    03/2018 IgG4 163, normal IgG     Most recent labs reviewed with the patient  IgG4  135    09/23/20 labs reviewed- IgG4 132    Impression    ICD-10-CM ICD-9-CM   1. IgG4 related disease (CMS-HCC)  D89.89 279.8   2. Multiple sclerosis (CMS-HCC)  G35 340   3. High risk medication use  Z79.899 V58.69   4. Uveitis of right eye  H20.9 364.3   5. Sialadenitis  K11.20 527.2   6. Cataract, unspecified cataract type, unspecified laterality  H26.9 366.9   7. Lymphadenopathy, cervical  R59.0 785.6   8. Arthralgia, unspecified joint  M25.50 719.40   9. Voice hoarseness  R49.0 784.42       Jeselle KGangis a 60year old female with multiple sclerosis, followed by Neurology.  Previously well controlled on treatment with natalizumab.    Switched to rituximab in March 2019 due to history of IgG4 related disease, with cervical lymphadenopathy, submandibular gland enlargement and recurrent uveitis of right eye.  Chronic nerve pain well controlled with Cymbalta and gabapentin.    IgG4 related disease on histopathology of enlarged right cervical lymph nodes.  No pancreatitis, no other systemic lymphadenopathy on CT chest abdomen pelvis performed April 2017.    History 2 episodes of anterior uveitis in 2018, no recurrence.  Negative ANA HLA B27, Qft TB    Last rituximab Feb 2022  Most recent IgG was 132, clinically stable    PLAN:   Repeat rituximab 1 g single dose in Aug if worsening   Repeat disease activity labs before next visit   Recommend 4th dose of COVID-19 vaccination    Orders Placed This Encounter   Procedures    CBC w/ Diff Lavender    CMP    C-Reactive Protein, Blood Green Plasma Separator Tube    Sedimentation Rate (ESR), Blood Lavender    IgG Subclass, Blood Yellow serum separator tube    IgG, Blood - See Instructions    Ig Panel (IgA, IgG, IgM) - See Instructions        Return in about 10 weeks (around 12/09/2020).

## 2020-10-01 ENCOUNTER — Telehealth (INDEPENDENT_AMBULATORY_CARE_PROVIDER_SITE_OTHER): Payer: Medicare Other | Admitting: Family Practice

## 2020-10-01 ENCOUNTER — Telehealth (INDEPENDENT_AMBULATORY_CARE_PROVIDER_SITE_OTHER): Payer: Medicare Other

## 2020-10-01 ENCOUNTER — Other Ambulatory Visit: Payer: Self-pay

## 2020-10-01 DIAGNOSIS — R942 Abnormal results of pulmonary function studies: Secondary | ICD-10-CM

## 2020-10-01 DIAGNOSIS — G8929 Other chronic pain: Secondary | ICD-10-CM

## 2020-10-01 DIAGNOSIS — M545 Low back pain, unspecified: Secondary | ICD-10-CM

## 2020-10-01 DIAGNOSIS — R0602 Shortness of breath: Secondary | ICD-10-CM

## 2020-10-01 DIAGNOSIS — Q790 Congenital diaphragmatic hernia: Secondary | ICD-10-CM

## 2020-10-01 DIAGNOSIS — R197 Diarrhea, unspecified: Secondary | ICD-10-CM

## 2020-10-01 DIAGNOSIS — R932 Abnormal findings on diagnostic imaging of liver and biliary tract: Secondary | ICD-10-CM

## 2020-10-01 NOTE — Patient Instructions (Addendum)
Please get the [place] of your liver  Please get the CT of your chest and abdomen to evaluate the hernia  Please see pulmonary  Please get the stool tests and lab work  I ordered Physical therapy referral for your back and x-ray of your back  I would recommend to follow up with one of my colleagues in 3 months and see me in January

## 2020-10-01 NOTE — Progress Notes (Signed)
FAMILY MEDICINE TELEMEDICINE PROGRESS NOTE    CC:    Chief Complaint   Patient presents with    Follow up Results     CT     [place]   ---------------------(data below generated by Christin Bach, MD)--------------------     Patient Verification & Telemedicine Consent & Financial Waiver:    1.   Identity: I have verified this patient's identity to be accurate.  2.   Consent: I verify consent has been secured in one of the following methods: (a) obtained written/ online attestation consent (via MyChartVideoVisit pathway), (b) the spoke-side provider has obtained verbal or written consent from patient/surrogate (if this is a "provider to provider" evaluation), or (c) in all other cases, I have personally obtained verbal consent from the patient/ surrogate (noting all elements below) to perform this voluntary telemedicine evaluation (including obtaining history, performing examination and reviewing data provided by the patient).   The patient/ surrogate has the right to refuse this evaluation.  I have explained risks (including potential loss of confidentiality), benefits, alternatives, and the potential need for subsequent face to face care. Patient/ surrogate understands that there is a risk of medical inaccuracies given that our recommendations will be made based on reported data (and we must therefore assume this information is accurate).  Knowing that there is a risk that this information is not reported accurately, and that the telemedicine video, audio, or data feed may be incomplete, the patient agrees to proceed with evaluation and holds [place] harmless knowing these risks.  3.   Healthcare Team: The patient/ surrogate has been notified that other healthcare professionals (including students, residents and Metallurgist) may be involved in this audio-video evaluation.   All laws concerning confidentiality and patient access to medical records and copies of medical records apply to telemedicine.  4.    Privacy: If this is a Radiographer, therapeutic Visit, the patient/ surrogate has received the [place] Notice of Privacy Practices via E-Checkin process.  For all other video visit techniques, I have verbally provided the patient/ surrogate with the [place] in [place] ([email or web address]://[email or web address].[email or web address]) or Spanish (https://health.https://www.matthews.info/.aspx).  The patient/ surrogate acknowledges both being provided the NPP link, and has been offered to have the NPP mailed to the patient/ surrogate by [place] mail.  The patient/ surrogate has voiced understanding an acknowledgement of receipt of this NPP web address.  If the patient/surrogate has elected to receive the NPP via [place] mail, I verify that the NPP will be sent promptly to the patient/surrogate via [place] mail.  5.   Capacity: I have reviewed this above verification and consent paragraph with the patient/ surrogate and the patient is capacitated or has a surrogate. If the patient is not capacitated to understand the above, and no surrogate is available, since this is not an emergency evaluation, the visit will be rescheduled until such time that the patient can consent, or the surrogate is available to consent. If this is an emergency evaluation and the patient is not capacitated to understand the above, and no surrogate is available, I am proceeding with this evaluation as this is felt to be an emergency setting and no appropriate specialist is available at the bedside to perform these evaluations.  6.   Financial Waiver: If this is a Radiographer, therapeutic Visit, the patient has been made aware of the financial waiver via E-Checkin process.  For all other video visit techniques, an E-Checkin process is not performed.  As such, I have personally verbally informed the  patient/ surrogate that this evaluation will be a billable encounter similar to an in-person clinic visit, and the patient/ surrogate has agreed to pay the fee for services rendered.  If we are  billing insurance for the patient's telehealth visit, her out-of-pocket cost will be determined based on her plan and will be billed to her.  The patient/ surrogate has also been informed that if the patient does not have insurance or does not wish to use insurance, [place] [employer] price for a primary care telehealth visit is $59.00 and specialist telehealth visit is $88.00.  I have further informed the patient/ surrogate that in the event the patient has additional services provided in conjunction with the specialty visit (Ex. Psychotherapy services), those services will be billed at the current rate less a 45% discount.  7.   Intra-State Location: The patient/ surrogate attests to understanding that if the patient accesses these services from a location outside of [place], that the patient does so at the patient's own risk and initiative and that the patient is ultimately responsible for compliance with any laws or regulations associated with the patient's use.  8.   Specific Use:The patient/ surrogate understands that [place] makes no representation that materials or servicesdelivered via telecommunication services, or listed on telemedicine websites, are appropriate or available for use in any other location.           Demographics:  Medical Record #: [id number]  Date: October 01, 2020  Patient Name: Debra Bolton  DOB: Sep 26, 1960  Age: 60 year old  Sex: female  Location: Home address on file     Evaluator(s):  Meigan Rivkin was evaluated by me today.    Clinic Location: [facility]  [address] [place] [address]                    SUBJECTIVE:    Debra Bolton is a 60 year old female who is being seen for the following issues:        HPI by Problem:     Review of test results, had heart ct scan, found to have Bochdalek hernia incidental and also liver with irregular contour possible cirrhosis, concerned about results    Has never heard  about it  No issues with passing gas  Having bowel movements    Has had in the last 10 days loose stool and nausea and sometimes vomiting, thinks could be related to anxiety  She has been worried about her liver and other issues  Also symptoms started after she found out her nephew was found dead, unsure of cause of death, he had some mental health issues      Feels less nauseous when she eats  She has been nervous was thought it was her nerves  First time it happened that was flu or viral illness, then happened second time was a few days later; then happened a few days later; not vomiting a lot, very nauseous  No vomiting today or yesterday  Usually has daily normal bowel movements, in past week more watery and small amount of solid, no blood, feels like until she is "emptied" out; if she relaxes and slows down and let it all go through her then she is ok, then will have a 7 up to help with hydration  She is not eating much, not feeling great  No fever    No anitbiotics  recently    She was in [place] seeing her grandchild, when she came home and took covid test, and it was negative      She had PFT's done, using inhaler when she exercises, has not been exercising the past 3 weeks so has not used  She had eye surgery and cannot bend over to do anything after surgery    Not sure if feeling of getting out of breath is getting better  Can do most things she can do  Does feel out of breath often, was told long covid symptoms would take a long time to improve    Pulmonary and neurology said it can take a long time to get back to normal after covid    She is getting colonosocpy at end of month  Going back to [place] temporarily in first week July coming back in August      Lower back right side, feels like has some sciatica, her low back hurts all the time, chronic ache in her low back on right side, not shooting down leg right, now   MS- has numbness off and on everywhere  No saddle anesthesia  No accidents bowel or  bladder            Review of Systems:   As per HPI     Patient Active Problem List   Diagnosis    Multiple sclerosis (CMS-HCC)    IgG4 related disease (CMS-HCC)    Uveitis of right eye    Posterior subcapsular age-related cataract, right eye    Mood disorder (CMS-HCC)    PTSD (post-traumatic stress disorder)    Adequate exercise, at or above goal    Rotator cuff tear    Dental caries, unspecified    Deposits (accretions) on teeth    Rectal bleeding    Inadequate exercise - not at goal       Outpatient Medications Prior to Visit   Medication Sig Dispense Refill    albuterol (VENTOLIN HFA) 108 (90 Base) MCG/ACT inhaler Inhale 2 puffs by mouth every 6 hours as needed for Wheezing. 3 each 1    Amantadine HCl 100 MG tablet Take 100 mg by mouth 2 times daily. Takes PRN 60 tablet 0    atropine (ISOPTO) 1 % ophthalmic solution 1 drop to operated eye bid. Use this drop after surgery. 5 mL 3    baclofen (LIORESAL) 10 MG tablet TAKE 1 TABLET BY MOUTH EVERY DAY NIGHTLY 30 tablet 11    buPROPion (WELLBUTRIN) 300 MG Extended-Release tablet Take 1 tablet (300 mg) by mouth every morning. 90 tablet 1    diazepam (VALIUM) 5 MG tablet Take 1 tablet (5 mg) by mouth daily as needed for Anxiety. 7 tablet 0    DULoxetine (CYMBALTA) 60 MG CR capsule Take 2 capsules (120 mg) by mouth daily. 180 capsule 11    gabapentin (NEURONTIN) 300 MG capsule Take 2 capsules (600 mg) by mouth nightly. 60 capsule 5    nortriptyline (PAMELOR) 25 MG capsule Take 2 capsules (50 mg) by mouth nightly. 180 capsule 3    ofloxacin (OCUFLOX) 0.3 % ophthalmic solution 1 drop to operated eye qid. Use this drop after surgery. 5 mL 3    prednisoLONE acetate (PRED FORTE) 1 % ophthalmic suspension 1 drop to operated eye qid. Use this drop after surgery. 5 mL 3    RITUXIMAB IV Every 6 months.      thyroid (ARMOUR THYROID) 15 MG tablet Take 1 tablet (15 mg)  by mouth every other day. 45 tablet 2    thyroid (ARMOUR THYROID) 60 MG tablet Take 1  tablet (60 mg) by mouth daily. 90 tablet 2    tiZANidine (ZANAFLEX) 4 MG tablet Take 1 tablet (4 mg) by mouth at bedtime. 90 tablet 1     No facility-administered medications prior to visit.           OBJECTIVE:  Physical Exam: General Appearance: healthy, alert, no distress, pleasant affect, cooperative.  Mental Status: Appearance/Cooperation: in no apparent distress and well developed and well nourished  Attitude: pleasant   Behavior :normal  Eye Contact: normal  Attention Span: good  Speech: normal volume, rate, and pitch  Affect: full and appropriate  Respiratory: able to speak in full sentences, no accessory muscle use noted    LABS:  Results for orders placed or performed during the hospital encounter of 09/26/20   COVID-19 BinaxNow Antigen (POCT)   Result Value Ref Range    COVID-19 BinaxNow Antigen (POCT) Negative     COVID-19 Antigen, Test Control Pass        EXAM DESCRIPTION:  CT HEART CALCIUM SCORE WITHOUT CONTRAST    CLINICAL HISTORY:  Famhx of CAD    TECHNIQUE:  Gated noncontrast imaging of the heart was obtained from the transverse aorta through the diaphragm.    Up-to-date CT equipment and radiation dose reduction techniques were employed. CTDIvol: 2.7 mGy. DLP: 43 mGy-cm.    COMPARISON:  None    FINDINGS:    AGATSTON CALCIUM SCORE:  Left main coronary artery: 0  Left anterior descending coronary artery: 54.2  Left circumflex coronary artery: 0  Right coronary artery: 0  TOTAL = 54.2.    Additional findings: Bochdalek hernia of the right diaphragm with protrusion of abdominal fat, measuring 3.6 cm. Additionally, liver with irregular contour, suggestive of cirrhosis. Descending aorta with focal calcification.             Signed by: Margarita Mail 09/24/2020 10:32:23  IMPRESSION:  IMPRESSION:  Agatston Calcium score =54.2. The places the patient at the 30th percentile for subjects of the same age, gender, and race/ethnicity who are free of clinical cardiovascular disease and treated  diabetes.    Bochdalek hernia incidentally noted. Liver with irregular contour, suggestive of cirrhosis. Recommend correlation with recent dedicated hepatic imaging or follow-up with liver ultrasound, if no recent imaging is available.      PFT 07/24/20  Physician Interpretation:  The spirometry pattern discloses a borderline obstructive defect in the small airways. The FEV1 and FVC are within normal limits but the  FEV1/FVC ratio is decreased. The mid-expiratory flow rates are disproportionately decreased and they correspond to a hyperbolic shape of  the expiratory portion of the flow-volume loop.  Lung volumes measured by plethysmography are within normal limits.  The airways resistance, measured during quiet breathing at resting lung volume and adjusted for the lung volume, is within normal limits.  After the inhalation of bronchodilators, spirometry, lung volumes and airway resistance were measured again. There was no significant  improvement in FEV1 or FVC. There was no convincing improvement in residual volume or airways resistance. However, the absence of a  demonstrable response may not preclude clinical benefit.  The DLCO adjusted to the patient's hemoglobin level is within normal limits.  Georgina Peer, MD [facility]  (electronically signed)    ASSESSMENT & PLAN:  Verneice Caspers is a 60 year old female was seen today for:  [place] was seen today for follow up  results and hernia.    Diagnoses and all orders for this visit:    Chronic right-sided low back pain without sciatica  -     Consult to  Physical Therapy - Internal; Future  -     X-Ray Lumbosacral Spine 2 Or 3 Views; Future  Start physical therapy, if no improvement needs to follow up may need further imaging  Abnormal finding on imaging of liver  -     Comprehensive Metabolic Panel (Expected today and later); Future  -     US Abdomen Complete; Future  Will get [place] of liver to evaluate abnormality noted on CT of chest and check liver enzymes  Bochdalek  hernia  -     CT Chest With Contrast; Future  -     CT Abdomen With Contrast; Future  No symptoms, discussed could be incidental finding, reviewed ER precautions, will get imaging to further evaluate  SOB (shortness of breath)  -     Pulmonary and Sleep Medicine Services; Future  Could be related to long covid, reviewed PFT results, will refer to pulmonary to discuss further  Abnormal PFT  -     Pulmonary and Sleep Medicine Services; Future    Diarrhea, unspecified type  -     Stool Culture [employer]; Future  -     C. difficile Toxin PCR, Stool Liquid Stool in Sterile Container; Future  Trying to stay hydrated, could be related to recent stress will get stool testing      Health Maintenance   Topic Date Due    Shingles Vaccine (1 of 2) Never done    Influenza (Season Ended) 11/17/2020    PHQ9 Depression Monitoring doc flowsheet  11/22/2020    Breast Cancer Screen  04/08/2021    Medicare Annual Wellness Visit  06/16/2021    Colorectal Cancer Screening  01/16/2022    Cervical Cancer Screening  01/19/2022    Tetanus (2 - Td or Tdap) 01/20/2027    Hepatitis C Screening  Completed    COVID-19 Vaccine  Completed    Polio Vaccine  Aged Out    HPV Vaccine <= 82 Yrs  Aged Out    Meningococcal MCV4 Vaccine  Aged Out    Pneumococcal Vaccine  Aged Out       Return in about 3 months (around 01/01/2021) for f/u SOB, back pain, MS, chronic issues 40 min.    Patient Instructions   Please get the [place] of your liver  Please get the CT of your chest and abdomen to evaluate the hernia  Please see pulmonary  Please get the stool tests and lab work  I ordered Physical therapy referral for your back and x-ray of your back  I would recommend to follow up with one of my colleagues in 3 months and see me in January        I personally spent 40 total minutes in face -to-face and non-face-to-face activities related to the patients visit today, excluding any separately reportable services/procedures.    Patient's  questions answered.    Safari Cinque, MD

## 2020-10-01 NOTE — Progress Notes (Deleted)
Retina Progress Note     Subjective:     Debra Bolton is a 60 year old year old female patient who presents as a POW1 s/p CE IOL right eye  Patient reports that they slept well overnight, denies pain, kept eye patch on. Patient confirms that they are in receipt of their postoperative drops. Patient feels she vision is already improved.     Assessment and Care Plan:   POW1 s/p CE IOL right eye   - Doing well, IOP acceptable, wound Seidel negative, IOL centered in capsular bag  - prednisolone acetate taper over 4 weeks 4-3-2-1, stop ofloxacin, stop atropine to operative eye  - Endophthalmitis precautions advised  - Wear hard shield  - Activity restrictions reviewed: no heavy lifting, no strenuous activity, no pooled water to the eye  - RTC 1 month with Dr. Frederico Hamman 10/25/20     Wende Neighbors, MD  Retina Fellow

## 2020-10-02 NOTE — Telephone Encounter (Signed)
From: [name]  To: Revere Kristen Loader, MD  Sent: 09/30/2020 3:27 PM PDT  Subject: MRI    I completed the MRI you ordered, now I just need to know how it looks, when to get a booster and infusion next, and how my labs look. I am traveling in and out of town for family things, but plan on staying at [facility] for the next 4 years. I also had a heart CT, with a possible liver issue. Can that be worse with all the medications I take? Trying to get clarity.   Thanks Debra Bolton

## 2020-10-03 ENCOUNTER — Encounter (INDEPENDENT_AMBULATORY_CARE_PROVIDER_SITE_OTHER): Payer: Medicare Other

## 2020-10-06 ENCOUNTER — Encounter (INDEPENDENT_AMBULATORY_CARE_PROVIDER_SITE_OTHER): Payer: Medicare Other | Admitting: Interventional Cardiology

## 2020-10-06 NOTE — Progress Notes (Signed)
Retina Progress Note     Subjective:     Richardine Peppers is a 60 year old year old female patient who presents as a POW1 s/p CE IOL right eye  Patient reports that they slept well overnight, denies pain, kept eye patch on. Patient confirms that they are in receipt of their postoperative drops. Patient feels she vision is already improved.    IMAGING/TESTING    Fundus Photo Interpretation    OD: flat    OS: flat      OCT Interpretation:    OD: no IRF    OS: no IRF     Assessment and Care Plan:   POW1 s/p CE IOL right eye   - Doing well, IOP acceptable, wound Seidel negative, IOL centered in capsular bag  - prednisolone acetate taper over 4 weeks 4-3-2-1, stop ofloxacin, stop atropine to operative eye  - Endophthalmitis precautions advised  - Wear hard shield at night  - Activity restrictions reviewed: no heavy lifting, no strenuous activity, no pooled water to the eye  - RTC 1 month with Dr. Frederico Hamman 10/25/20     Wende Neighbors, MD  Retina Fellow

## 2020-10-07 ENCOUNTER — Ambulatory Visit (INDEPENDENT_AMBULATORY_CARE_PROVIDER_SITE_OTHER): Payer: Medicare Other

## 2020-10-07 DIAGNOSIS — Z09 Encounter for follow-up examination after completed treatment for conditions other than malignant neoplasm: Secondary | ICD-10-CM

## 2020-10-07 DIAGNOSIS — Z9889 Other specified postprocedural states: Secondary | ICD-10-CM

## 2020-10-08 ENCOUNTER — Telehealth (INDEPENDENT_AMBULATORY_CARE_PROVIDER_SITE_OTHER): Payer: Self-pay | Admitting: Neurology

## 2020-10-08 ENCOUNTER — Telehealth (INDEPENDENT_AMBULATORY_CARE_PROVIDER_SITE_OTHER): Payer: Self-pay | Admitting: Gastroenterology

## 2020-10-08 ENCOUNTER — Other Ambulatory Visit: Payer: Self-pay | Admitting: Legal Medicine

## 2020-10-08 DIAGNOSIS — E782 Mixed hyperlipidemia: Secondary | ICD-10-CM

## 2020-10-08 NOTE — Telephone Encounter (Signed)
Pre-procedure call made. Left a message for the patient to call back to confirm their procedure check in time, procedure date, and location. Also to remind the patient that they need transportation post procedure, pick up their bowel prep (if applicable) and confirm our current COVID protocols/testing guidelines.    When the patient calls back please ask the COVID screening questions and document. Thank you.

## 2020-10-08 NOTE — Telephone Encounter (Signed)
Called pt. (*[phone]) to schedule follow up. lvm

## 2020-10-09 ENCOUNTER — Other Ambulatory Visit (INDEPENDENT_AMBULATORY_CARE_PROVIDER_SITE_OTHER): Payer: Self-pay | Admitting: Gastroenterology

## 2020-10-09 ENCOUNTER — Encounter (INDEPENDENT_AMBULATORY_CARE_PROVIDER_SITE_OTHER): Payer: Self-pay | Admitting: Ophthalmology

## 2020-10-09 ENCOUNTER — Other Ambulatory Visit (INDEPENDENT_AMBULATORY_CARE_PROVIDER_SITE_OTHER): Payer: Self-pay | Admitting: Psychiatric/Mental Health

## 2020-10-09 DIAGNOSIS — Z1211 Encounter for screening for malignant neoplasm of colon: Secondary | ICD-10-CM

## 2020-10-09 DIAGNOSIS — F339 Major depressive disorder, recurrent, unspecified: Secondary | ICD-10-CM

## 2020-10-09 MED ORDER — BUPROPION XL (DAILY) 300 MG OR TB24
ORAL_TABLET | ORAL | 1 refills | Status: DC
Start: 2020-10-09 — End: 2021-03-09

## 2020-10-09 NOTE — Telephone Encounter (Signed)
Patient is having a coloscopy on 06/29 with Dr. Truddie Coco. Per patient bowel prep is expired and she unable to obtain. New rx pended and routed to provider for review. Thank you

## 2020-10-10 ENCOUNTER — Other Ambulatory Visit (INDEPENDENT_AMBULATORY_CARE_PROVIDER_SITE_OTHER): Payer: Medicare Other

## 2020-10-10 ENCOUNTER — Encounter (INDEPENDENT_AMBULATORY_CARE_PROVIDER_SITE_OTHER): Payer: Self-pay | Admitting: Hospital

## 2020-10-10 MED ORDER — SUPREP BOWEL PREP KIT 17.5-3.13-1.6 GM/177ML PO SOLN
1.0000 | Freq: Once | ORAL | 0 refills | Status: AC
Start: 2020-10-10 — End: 2020-10-10

## 2020-10-13 ENCOUNTER — Telehealth (INDEPENDENT_AMBULATORY_CARE_PROVIDER_SITE_OTHER): Payer: Self-pay | Admitting: Psychiatric/Mental Health

## 2020-10-13 NOTE — Telephone Encounter (Signed)
GEN PSYCH PATIENT (scheduling instructions):    *If patient calls back, OK to schedule at [facility] or [facility] - please follow the instructions below:  Transfers at [facility]: 1hr Transfer of Care slot OR 1hr Transfer of Care VIDEO slot  Options:  --Dr. Stephanie Martinez, MD - clinic days: Monday, Tuesday, Thursday  --Dr. Andres Schneeberger, MD - clinic days: Monday - Thursday  --Dr. Alejandro Meruelo, MD, PhD - clinic day: Tuesday  --Dr. Xia Li, MD, PhD - clinic days: Monday, Thursday  **New providers coming July/August 2022:   --Dr. Shane Castro, MD  --Dr. Karim Ghobrial-Sedky, MD    Transfers at [facility]: 30min Return slot  Options:  --Crystal Ross, NP (Tues)  --Steve Koh, MD (Wed)  --Dr. Lama Muhammad, MD (Wed)  --Dr. Gregory Bishop, MD (Mon-Fri)  *Or with NEW [facility] residents (1hr appt- OK to use 2 Return slots)  --Akkoor, Amendolara, Bent, Caballero, Fettinger, Fry, Kriksciun, Kruckeberg, Zobell, Castillo, Creel  Add in Appt Notes ? [facility] transfer  Add a Permanent Note ?*Case Closed at [facility]- patient transferred to [facility] -initials/date*   Give Clinic # to patient ? [phone] option 3  Clinic Address: [address] [place], [place] [address]    *If patients need a refill - please transfer patient to the refill line after they schedule their appointment.

## 2020-10-13 NOTE — Addendum Note (Signed)
Addended by: Filiberto Pinks on: 10/13/2020 02:39 PM     Modules accepted: Orders

## 2020-10-13 NOTE — Interdisciplinary (Signed)
AVS Discharge instructions given to patient? Yes     Patient verbalized understanding of instructions given? Yes     Did patient have additional questions? No If yes, explain:     Next Scheduled Appointment 12/09/20    Patient was notified of appointment policy and cancellation options: Yes     In-Person No     MyChart Telemedicine Visit: Yes     Telephone Follow up: No

## 2020-10-14 NOTE — Telephone Encounter (Signed)
Patient called back to confirm upcoming procedure scheduled for 6/29/22and confirmed the following     Procedure date, time and location. yes  Transportation policy yes  Preparation instructions and that the patient has reviewed their procedure instruction letter yes  Patient is having and EGD [place] a. No  Confirmed that patient is aware of the current COVID protocols/testing guidelines yes    Are you currently experiencing any of the following symptoms (fever, new cough, shortness of breath or loss of taste and smell)?     a. No    The patient had no further questions at this time. Thank you.

## 2020-10-15 ENCOUNTER — Ambulatory Visit
Admission: RE | Admit: 2020-10-15 | Discharge: 2020-10-15 | Disposition: A | Discharge status: Home / Self Care | Payer: Medicare Other | Attending: Gastroenterology | Admitting: Gastroenterology

## 2020-10-15 ENCOUNTER — Ambulatory Visit (HOSPITAL_BASED_OUTPATIENT_CLINIC_OR_DEPARTMENT_OTHER): Payer: Medicare Other | Admitting: Certified Registered Nurse Anesthetist

## 2020-10-15 ENCOUNTER — Ambulatory Visit (HOSPITAL_BASED_OUTPATIENT_CLINIC_OR_DEPARTMENT_OTHER): Payer: Medicare Other | Admitting: Nurse Practitioner

## 2020-10-15 ENCOUNTER — Encounter (HOSPITAL_BASED_OUTPATIENT_CLINIC_OR_DEPARTMENT_OTHER): Admission: RE | Disposition: A | Discharge status: Home Health | Payer: Self-pay | Attending: Gastroenterology

## 2020-10-15 DIAGNOSIS — Z1211 Encounter for screening for malignant neoplasm of colon: Secondary | ICD-10-CM

## 2020-10-15 DIAGNOSIS — K635 Polyp of colon: Secondary | ICD-10-CM | POA: Insufficient documentation

## 2020-10-15 DIAGNOSIS — K921 Melena: Secondary | ICD-10-CM

## 2020-10-15 DIAGNOSIS — D122 Benign neoplasm of ascending colon: Secondary | ICD-10-CM

## 2020-10-15 DIAGNOSIS — K5730 Diverticulosis of large intestine without perforation or abscess without bleeding: Secondary | ICD-10-CM | POA: Insufficient documentation

## 2020-10-15 DIAGNOSIS — Z79899 Other long term (current) drug therapy: Secondary | ICD-10-CM | POA: Insufficient documentation

## 2020-10-15 DIAGNOSIS — K625 Hemorrhage of anus and rectum: Secondary | ICD-10-CM | POA: Insufficient documentation

## 2020-10-15 DIAGNOSIS — K648 Other hemorrhoids: Secondary | ICD-10-CM | POA: Insufficient documentation

## 2020-10-15 DIAGNOSIS — Z880 Allergy status to penicillin: Secondary | ICD-10-CM | POA: Insufficient documentation

## 2020-10-15 DIAGNOSIS — K6389 Other specified diseases of intestine: Secondary | ICD-10-CM

## 2020-10-15 SURGERY — COLONOSCOPY
Anesthesia: Monitored Anesthesia Care (MAC)

## 2020-10-15 MED ORDER — LIDOCAINE HCL 2 % IJ SOLN WRAPPED RECORD
INTRAMUSCULAR | Status: DC | PRN
Start: 2020-10-15 — End: 2020-10-15
  Administered 2020-10-15 (×2): 20 mg via INTRAVENOUS

## 2020-10-15 MED ORDER — LIDOCAINE HCL (PF) 1 % IJ SOLN
0.1000 mL | Freq: Once | INTRAMUSCULAR | Status: DC | PRN
Start: 2020-10-15 — End: 2020-10-15

## 2020-10-15 MED ORDER — SODIUM CHLORIDE 0.9 % IV SOLN
INTRAVENOUS | Status: DC
Start: 2020-10-15 — End: 2020-10-15

## 2020-10-15 MED ORDER — ONDANSETRON HCL 4 MG/2ML IV SOLN
4.0000 mg | Freq: Once | INTRAMUSCULAR | Status: DC | PRN
Start: 2020-10-15 — End: 2020-10-15

## 2020-10-15 MED ORDER — PROPOFOL 200 MG/20ML IV EMUL
INTRAVENOUS | Status: DC | PRN
Start: 2020-10-15 — End: 2020-10-15
  Administered 2020-10-15: 20 mg via INTRAVENOUS
  Administered 2020-10-15: 40 mg via INTRAVENOUS
  Administered 2020-10-15: 30 mg via INTRAVENOUS
  Administered 2020-10-15: 20 mg via INTRAVENOUS

## 2020-10-15 MED ORDER — SODIUM CHLORIDE 0.9 % IV SOLN
INTRAVENOUS | Status: DC | PRN
Start: 2020-10-15 — End: 2020-10-15

## 2020-10-15 MED ORDER — PROPOFOL 1000 MG/100ML IV EMUL
INTRAVENOUS | Status: DC | PRN
Start: 2020-10-15 — End: 2020-10-15
  Administered 2020-10-15: 100 ug/kg/min via INTRAVENOUS
  Administered 2020-10-15: 09:00:00 150 ug/kg/min via INTRAVENOUS
  Administered 2020-10-15: 175 ug/kg/min via INTRAVENOUS
  Administered 2020-10-15: 150 ug/kg/min via INTRAVENOUS

## 2020-10-15 MED ORDER — DIPHENHYDRAMINE HCL 50 MG/ML IJ SOLN
12.5000 mg | Freq: Once | INTRAMUSCULAR | Status: DC | PRN
Start: 2020-10-15 — End: 2020-10-15

## 2020-10-15 SURGICAL SUPPLY — 3 items
SNARE COLD CAPTIVATOR 10MM ROUNDED STIFF (Misc Surgical Supply) ×1
SNARE COLD CAPTIVATOR 10MM ROUNDED STIFF 10/BX (Misc Surgical Supply) ×2
TRAP POLYP E TRAP (Misc Medical Supply) ×2

## 2020-10-15 NOTE — H&P (Signed)
History and Physical    Indication for procedure:  Colonoscopy for rectal bleeding               Past Medical History:   Diagnosis Date    Asthma 1991    with pregnancy    Hypothyroidism     Major depressive disorder, single episode     MS (multiple sclerosis) (CMS-HCC)      Past Surgical History:   Procedure Laterality Date    leep      for cervical dysplasia    LYMPH NODE BIOPSY      IgG4 related disease    TONSILLECTOMY AND ADENOIDECTOMY      only tonsillectomy     Allergies   Allergen Reactions    Penicillins Rash    Modafinil Diarrhea and Nausea and Vomiting     Prior to Admission Medications   Prescriptions Last Dose Informant Patient Reported? Taking?   Amantadine HCl 100 MG tablet   No No   Sig: Take 100 mg by mouth 2 times daily. Takes PRN   DULoxetine (CYMBALTA) 60 MG CR capsule   No No   Sig: Take 2 capsules (120 mg) by mouth daily.   Na Sulfate-K Sulfate-Mg Sulf (SUPREP BOWEL PREP KIT) bowel prep kit   No No   Sig: Take 1 kit by mouth once for 1 dose. take as directed per written instructions provided by GI clinic for split dose preparation.   RITUXIMAB IV   Yes No   Sig: Every 6 months.   albuterol (VENTOLIN HFA) 108 (90 Base) MCG/ACT inhaler   No No   Sig: Inhale 2 puffs by mouth every 6 hours as needed for Wheezing.   atropine (ISOPTO) 1 % ophthalmic solution   No No   Sig: 1 drop to operated eye bid. Use this drop after surgery.   baclofen (LIORESAL) 10 MG tablet   No No   Sig: TAKE 1 TABLET BY MOUTH EVERY DAY NIGHTLY   buPROPion (WELLBUTRIN) 300 MG Extended-Release tablet   No No   Sig: TAKE 1 TABLET BY MOUTH EVERY MORNING   diazepam (VALIUM) 5 MG tablet   No No   Sig: Take 1 tablet (5 mg) by mouth daily as needed for Anxiety.   gabapentin (NEURONTIN) 300 MG capsule   No No   Sig: Take 2 capsules (600 mg) by mouth nightly.   nortriptyline (PAMELOR) 25 MG capsule   No No   Sig: Take 2 capsules (50 mg) by mouth nightly.   ofloxacin (OCUFLOX) 0.3 % ophthalmic solution   No No   Sig: 1 drop to  operated eye qid. Use this drop after surgery.   prednisoLONE acetate (PRED FORTE) 1 % ophthalmic suspension   No No   Sig: 1 drop to operated eye qid. Use this drop after surgery.   thyroid (ARMOUR THYROID) 15 MG tablet   No No   Sig: Take 1 tablet (15 mg) by mouth every other day.   thyroid (ARMOUR THYROID) 60 MG tablet   No No   Sig: Take 1 tablet (60 mg) by mouth daily.   tiZANidine (ZANAFLEX) 4 MG tablet   No No   Sig: Take 1 tablet (4 mg) by mouth at bedtime.      Facility-Administered Medications: None       There were no vitals taken for this visit.  General: Well developed, well nourished, in no apparent distress.  Lungs: Clear breath sounds bilaterally.  CV: Normal rate, regular  rhythm, no significant murmur present.  Abdomen: Soft, nontender, normal bowel sounds present.    ASA Score:  2   Airway (Mallimpati) Score:  Class II - Soft palate, uvula, and fauces are visible.    Assessment and Plan  Proceed to planned procedure.    The patient has consented to the procedure, which will be done with sedation.  I have assessed the patient's status immediately prior to this procedure.  I have discussed pain management needs and options for the patient with the patient or caregiver.      The patient agrees to be full code for the duration of the procedure.    Sedation options, risks, and plans have been discussed with the patient or caregiver.  Questions were answered.  The patient or caregiver agrees to proceed as planned.    Debra Bolton

## 2020-10-15 NOTE — Anesthesia Postprocedure Evaluation (Signed)
Anesthesia Post Note    Patient: Debra Bolton    Procedure(s) Performed: Procedure(s):  GI COLONOSCOPY      Final anesthesia type: Monitored Anesthesia Care    Patient location: PACU    Post anesthesia pain: adequate analgesia    Mental status: awake, alert  and oriented    Airway Patent: Yes    Last Vitals:   Vitals Value Taken Time   BP 111/81 10/15/20 1015   Temp 36.4 C 10/15/20 1015   Pulse 67 10/15/20 1015   Resp 10 10/15/20 1015   SpO2 100 % 10/15/20 1015        Post vital signs: stable    Hydration: adequate    N/V:no    Anesthetic complications: no    Plan of care per primary team.

## 2020-10-15 NOTE — Procedures (Signed)
[place]  Gastroenterology/Special Procedures    Patient Name: Debra Bolton  Date of Birth: 12/29/1960  Record Number: [id number]  Date of Procedure: 10/15/2020  Referring Physician:   Endoscopist: Renelda Loma   Asst. Endoscopist:     Nurse: Franne Forts    PROCEDURE PERFORMED  Colonoscopy - snare polypectomy    INDICATIONS FOR EXAMINATION   rectal bleeding     Instruments:  972  Medications: MAC Propofol                  The attending physician, Dr. Truddie Coco, was present for the entire examination.  Procedure Technique: Patient's medications, allergies, past medical, surgical, social and family histories were reviewed and updated as appropriate. A discussion of informed consent was had with the patient and/or the patient's family prior to the   procedure, including sedation.  The alternatives, benefits and risks of the procedure including but not limited to pain, perforation, hemorrhage, infection, adverse drug reaction, missed lesion/incomplete procedure, and aspiration were discussed    Description of Procedure:  Based on the pre-procedure assessment, including review of the patient's medical history, medications, allergies, and review of systems, the patient had been deemed to be an appropriate candidate for sedation; the patient was therefore sedated with the   medications listed. The patient was monitored continuously with pulse oximetry, blood pressure monitoring, and direct observations.  After digital rectal examination, the colonoscope 972 was inserted into the rectum and advanced under direct vision to the level of the terminal ileum.  The quality of the colonic preparation was Excellent.  A careful inspection was made as the   colonoscope was withdrawn.  Findings and interventions are described below.  Extent of Exam: terminal ileum.  Biopsy Obtained: No.  Complications: .   Estimated Blood Loss: Minimal.   Need for Future Anesthesia: MAC.  Cecal Withdrawal Time:  Total Procedure Time:      FINDINGS  -Pediatric colonoscope was used. Withdrawal time was over 12 minutes  -The distal terminal ileum appeared normal  -2 17m sessile ascending and descending colon polyps resected and retrieved with cold snare polypectomy  -A few scattered diverticula throughout the colon  -Moderate internal hemorrhoids seen on retroflexion    ENDOSCOPIC DIAGNOSIS  -The distal terminal ileum appeared normal  -2 483msessile ascending and descending colon polyps resected and retrieved with cold snare polypectomy  -A few scattered diverticula throughout the colon  -Moderate internal hemorrhoids seen on retroflexion    RECOMMENDATIONS  -Follow up pathology  -Repeat colonoscopy in 7-10 years based on path and health status at that time    Signature:_________________________________ JoRenelda LomaMD              ([phone]   Note:  The final official report is in the [facility] record.      This electronic signature authenticates all electronic and/or handwritten documentation, including orders, generated by the signer during the episode of care contained in this record.  10/15/2020 09:43:34 AM By RuRenelda LomaD

## 2020-10-18 ENCOUNTER — Ambulatory Visit (INDEPENDENT_AMBULATORY_CARE_PROVIDER_SITE_OTHER): Payer: Self-pay | Admitting: Family Practice

## 2020-10-21 ENCOUNTER — Ambulatory Visit
Admission: RE | Admit: 2020-10-21 | Discharge: 2020-10-21 | Disposition: A | Discharge status: Home / Self Care | Payer: Medicare Other | Attending: Family Practice | Admitting: Family Practice

## 2020-10-21 ENCOUNTER — Other Ambulatory Visit (INDEPENDENT_AMBULATORY_CARE_PROVIDER_SITE_OTHER): Payer: Medicare Other

## 2020-10-21 DIAGNOSIS — D849 Immunodeficiency, unspecified: Secondary | ICD-10-CM

## 2020-10-21 DIAGNOSIS — M47816 Spondylosis without myelopathy or radiculopathy, lumbar region: Secondary | ICD-10-CM

## 2020-10-21 DIAGNOSIS — R197 Diarrhea, unspecified: Secondary | ICD-10-CM

## 2020-10-21 DIAGNOSIS — G8929 Other chronic pain: Secondary | ICD-10-CM | POA: Insufficient documentation

## 2020-10-21 DIAGNOSIS — M545 Low back pain, unspecified: Secondary | ICD-10-CM

## 2020-10-21 DIAGNOSIS — G35 Multiple sclerosis: Secondary | ICD-10-CM

## 2020-10-21 DIAGNOSIS — R932 Abnormal findings on diagnostic imaging of liver and biliary tract: Secondary | ICD-10-CM

## 2020-10-21 DIAGNOSIS — D8989 Other specified disorders involving the immune mechanism, not elsewhere classified: Secondary | ICD-10-CM

## 2020-10-21 DIAGNOSIS — M5136 Other intervertebral disc degeneration, lumbar region: Secondary | ICD-10-CM

## 2020-10-21 LAB — CBC WITH DIFF, BLOOD
ANC-Automated: 2.8 10*3/uL (ref 1.6–7.0)
Abs Basophils: 0 10*3/uL (ref <0.1)
Abs Eosinophils: 0.3 10*3/uL (ref 0.0–0.5)
Abs Lymphs: 2 10*3/uL (ref 0.8–3.1)
Abs Monos: 0.5 10*3/uL (ref 0.2–0.8)
Basophils: 1 %
Eosinophils: 6 %
Hct: 40.6 % (ref 34.0–45.0)
Hgb: 13.2 gm/dL (ref 11.2–15.7)
Lymphocytes: 36 %
MCH: 29.6 pg (ref 26.0–32.0)
MCHC: 32.5 g/dL (ref 32.0–36.0)
MCV: 91 um3 (ref 79.0–95.0)
MPV: 9.3 fL — ABNORMAL LOW (ref 9.4–12.4)
Monocytes: 9 %
Plt Count: 295 10*3/uL (ref 140–370)
RBC: 4.46 10*6/uL (ref 3.90–5.20)
RDW: 11.7 % — ABNORMAL LOW (ref 12.0–14.0)
Segs: 49 %
WBC: 5.6 10*3/uL (ref 4.0–10.0)

## 2020-10-21 LAB — COMPREHENSIVE METABOLIC PANEL, BLOOD
ALT (SGPT): 16 U/L (ref 0–33)
AST (SGOT): 21 U/L (ref 0–32)
Albumin: 4.7 g/dL (ref 3.5–5.2)
Alkaline Phos: 65 U/L (ref 35–104)
Anion Gap: 10 mmol/L (ref 7–15)
BUN: 8 mg/dL (ref 8–23)
Bicarbonate: 28 mmol/L (ref 22–29)
Bilirubin, Tot: 0.33 mg/dL (ref <1.2)
Calcium: 10.7 mg/dL — ABNORMAL HIGH (ref 8.5–10.6)
Chloride: 101 mmol/L (ref 98–107)
Creatinine: 0.56 mg/dL (ref 0.51–0.95)
GFR: >60 mL/min
Glucose: 93 mg/dL (ref 70–99)
Potassium: 4.4 mmol/L (ref 3.5–5.1)
Sodium: 139 mmol/L (ref 136–145)
Total Protein: 7.7 g/dL (ref 6.0–8.0)
eGFR Based on CKD-EPI 2021 Equation: >60 mL/min

## 2020-10-21 LAB — IMMUNOGLOBULIN PANEL (IGA,IGG,IGM), BLOOD
IGA: 513 mg/dL — ABNORMAL HIGH (ref 70–400)
IGG: 985 mg/dL (ref 700–1600)
IGM: 24 mg/dL — ABNORMAL LOW (ref 40–230)

## 2020-10-21 LAB — SED RATE, BLOOD: Sed Rate: 4 mm/hr (ref 0–30)

## 2020-10-21 LAB — C-REACTIVE PROTEIN, BLOOD: CRP: <0.3 mg/dL (ref <0.5)

## 2020-10-21 NOTE — Telephone Encounter (Signed)
From: [name]  To: Verl Blalock Edi, MD  Sent: 10/18/2020 8:04 PM PDT  Subject: Vomiting diarrhea     Hi Dr Edi   Unfortunately I am having projectile vomiting and diarrhea again. So this is the 3rd time in the last 20 days. I tried to schedule the CTs but as you said they are behind. I had cataract surgery and a colonoscopy over the last 10 days, but I am not caught up on tests. I am however concerned. Any chance this is the [place] hernia? I'm supposed to go to [place] on the 6th, but obviously I need to be well.    Any thoughts   Thanks [name]

## 2020-10-21 NOTE — Telephone Encounter (Signed)
PROVIDER ACTION REQUESTED: No, FYI only   Action item needed:  No   Appt scheduled: Pt declined    Chief Complaint  Had symptoms x on Saturd  Assessment details:  No symptoms today. Was having vomiting and diarrhea on Saturday which have resolved. Pt declined triage. Will wait for tests results and review with PCP.    CLINIC/RN/LVN ACTION REQUEST: NO, FYI Only  Action item needed: NO, FYI Only    Routing to Provider for review and pt given strict ED precautions and reviewed all applicable Home Care Advice per protocol.     If COVID+, is patient interested in Paxlovid or other COVID therapeutics: NO, pt not interested   If COVID+, please advise pt to upload test result.     COVID VACCINATION STATUS: Yes, fully vaccinated  COVID TRIAGE PROTOCOL: NEGATIVE   Symptoms w/in the last 10 days: fever or chills, cough, sob/diff breathing, fatigue, muscle or body aches, headache, loss of taste or smell, sore throat, congestion or runny nose, nausea and vomiting, diarrhea.   Questions:   1. Looking back to the last 10 days, have you been informed by a public health agency or a healthcare agency or a healthcare system that you have been exposed to [place] (COVID-19)  2. Looking back to the last 10 days, has a person in your household been diagnosed with Coronavirus (COVID-19) infection       Reason for Call: Symptoms (Information only, no symptoms at the time of call)     Disposition: Information or Advice Only Call       Reason for Disposition   [1] Follow-up call to recent contact AND [2] information only call, no triage required    Additional Information   Negative: [1] Caller is not with the adult (patient) AND [2] reporting urgent symptoms   Negative: Lab result questions   Negative: Medication questions   Negative: Caller can't be reached by phone   Negative: Caller has already spoken to PCP or another triager   Negative: RN needs further essential information from caller in order to complete triage   Negative:  Requesting regular office appointment   Negative: [1] Caller requesting NON-URGENT health information AND [2] PCP's office is the best resource    Answer Assessment - Initial Assessment Questions  1. REASON FOR CALL or QUESTION: "What is your reason for calling today?" or "How can I best help you?" or "What question do you have that I can help answer?"      Triage follow up call for my chart message sent by pt 3 days ago. Pt says symptoms have resolved today. Is not having vomiting or diarrhea.   Says Dr. Rana Snare told pt to let Dr. Rana Snare know when pt was having symptoms and pt let Dr. Rana Snare know when pt was having symptoms. My chart message was sent as an FYI per pt.   Pt had tests done today and will wait for results to review with Dr. Rana Snare. Declined triage at this time.    Protocols used: INFORMATION ONLY CALL-A-AH

## 2020-10-22 LAB — IGG SUBCLASS PANEL, BLOOD
IGG Subclass 1: 467 mg/dL (ref 240–1118)
IGG Subclass 2: 280 mg/dL (ref 124–549)
IGG Subclass 3: 9 mg/dL — ABNORMAL LOW (ref 21–134)
IGG Subclass 4: 130 mg/dL — ABNORMAL HIGH (ref 1–123)

## 2020-10-23 LAB — B CELL SUBSET ANALYSIS
CD19+ B Cells %: 0.1 % Lymphs — ABNORMAL LOW (ref 6.4–22.0)
CD19+ B Cells: 3 cells/uL — ABNORMAL LOW (ref 110–450)

## 2020-10-24 ENCOUNTER — Encounter (INDEPENDENT_AMBULATORY_CARE_PROVIDER_SITE_OTHER): Payer: Self-pay | Admitting: Nurse Practitioner

## 2020-10-24 LAB — STOOL CULTURE: Shiga Toxin Result: NEGATIVE

## 2020-10-27 DIAGNOSIS — M47816 Spondylosis without myelopathy or radiculopathy, lumbar region: Secondary | ICD-10-CM | POA: Diagnosis not present

## 2020-10-27 DIAGNOSIS — G894 Chronic pain syndrome: Secondary | ICD-10-CM | POA: Diagnosis not present

## 2020-10-27 DIAGNOSIS — Z1389 Encounter for screening for other disorder: Secondary | ICD-10-CM | POA: Diagnosis not present

## 2020-10-27 DIAGNOSIS — M5136 Other intervertebral disc degeneration, lumbar region: Secondary | ICD-10-CM | POA: Diagnosis not present

## 2020-10-27 DIAGNOSIS — M461 Sacroiliitis, not elsewhere classified: Secondary | ICD-10-CM | POA: Diagnosis not present

## 2020-10-27 DIAGNOSIS — M48061 Spinal stenosis, lumbar region without neurogenic claudication: Secondary | ICD-10-CM | POA: Diagnosis not present

## 2020-10-28 ENCOUNTER — Telehealth (INDEPENDENT_AMBULATORY_CARE_PROVIDER_SITE_OTHER): Payer: Self-pay | Admitting: Gastroenterology

## 2020-10-28 ENCOUNTER — Encounter (INDEPENDENT_AMBULATORY_CARE_PROVIDER_SITE_OTHER): Payer: Self-pay | Admitting: Psychiatric/Mental Health

## 2020-10-28 NOTE — Telephone Encounter (Signed)
LM for patient informing her that I made her a video visit with Dr. Rana Snare @920am  on 11/05/2020. I will also mychart patient to let her know.

## 2020-10-28 NOTE — Telephone Encounter (Signed)
Mychart message sent to pt verbatim of MD result note and procedure note recommendations. Health Maintenance and procedure history updated per MD result note

## 2020-10-28 NOTE — Telephone Encounter (Signed)
From: [name]  To: Areta Haber, NP  Sent: 10/28/2020 9:18 AM PDT  Subject: Retirement    Dear [name],  Just a note to express my gratitude for you at all of our appts. Your ability to understand my issues and kindness to me with my struggles did not go unnoticed. I am grateful for you compassion and empathy and you will be missed. Congratulations on your retirement!  Thank you Debra Bolton

## 2020-11-04 ENCOUNTER — Encounter (INDEPENDENT_AMBULATORY_CARE_PROVIDER_SITE_OTHER): Payer: Self-pay | Admitting: Neurology

## 2020-11-04 ENCOUNTER — Telehealth (INDEPENDENT_AMBULATORY_CARE_PROVIDER_SITE_OTHER): Payer: Self-pay | Admitting: Interventional Cardiology

## 2020-11-04 DIAGNOSIS — Z8249 Family history of ischemic heart disease and other diseases of the circulatory system: Secondary | ICD-10-CM

## 2020-11-04 DIAGNOSIS — G35 Multiple sclerosis: Secondary | ICD-10-CM

## 2020-11-04 NOTE — Telephone Encounter (Signed)
Previous lab order expired.     New orders submitted.

## 2020-11-05 ENCOUNTER — Encounter (INDEPENDENT_AMBULATORY_CARE_PROVIDER_SITE_OTHER): Payer: Medicare Other | Admitting: Family Practice

## 2020-11-05 ENCOUNTER — Telehealth (INDEPENDENT_AMBULATORY_CARE_PROVIDER_SITE_OTHER): Payer: Self-pay | Admitting: Family Practice

## 2020-11-05 DIAGNOSIS — Z0189 Encounter for other specified special examinations: Secondary | ICD-10-CM

## 2020-11-05 NOTE — Telephone Encounter (Signed)
I signed the order for lab testing  I don't see a food log though on mychart, can she re-send it?  Thanks!

## 2020-11-05 NOTE — Telephone Encounter (Signed)
I spoke with patient and advised that labs have been signed.   Patient will send a picture of her food log.

## 2020-11-05 NOTE — Telephone Encounter (Signed)
Patient was scheduled for a video visit with Dr. Rana Snare today (07/20) at 9:20 AM; I called patient to complete intake and she informed me that she's not in [place]. I advise patient that appointment will be cancelled; patient verbalized understanding.     Patient requests to send a message to Dr. Rana Snare to inform her that she is keeping a food log (please refer to MyChart message dated 07/08).    Patient is scheduled for CT Abdomen and Abdomen US next month and she was advised by radiology department that metabolic labs need to be completed prior to appointment.

## 2020-11-06 DIAGNOSIS — F17210 Nicotine dependence, cigarettes, uncomplicated: Secondary | ICD-10-CM | POA: Diagnosis not present

## 2020-11-06 DIAGNOSIS — I6523 Occlusion and stenosis of bilateral carotid arteries: Secondary | ICD-10-CM | POA: Diagnosis not present

## 2020-11-10 ENCOUNTER — Other Ambulatory Visit: Payer: Self-pay | Admitting: Legal Medicine

## 2020-11-10 DIAGNOSIS — J449 Chronic obstructive pulmonary disease, unspecified: Secondary | ICD-10-CM

## 2020-11-13 ENCOUNTER — Other Ambulatory Visit: Payer: Self-pay | Admitting: Legal Medicine

## 2020-11-13 DIAGNOSIS — I10 Essential (primary) hypertension: Secondary | ICD-10-CM

## 2020-11-17 ENCOUNTER — Encounter (INDEPENDENT_AMBULATORY_CARE_PROVIDER_SITE_OTHER): Payer: Medicare Other | Admitting: Interventional Cardiology

## 2020-11-24 DIAGNOSIS — M5136 Other intervertebral disc degeneration, lumbar region: Secondary | ICD-10-CM | POA: Diagnosis not present

## 2020-11-24 DIAGNOSIS — G894 Chronic pain syndrome: Secondary | ICD-10-CM | POA: Diagnosis not present

## 2020-11-24 DIAGNOSIS — Z1389 Encounter for screening for other disorder: Secondary | ICD-10-CM | POA: Diagnosis not present

## 2020-11-24 DIAGNOSIS — M461 Sacroiliitis, not elsewhere classified: Secondary | ICD-10-CM | POA: Diagnosis not present

## 2020-11-24 DIAGNOSIS — M48061 Spinal stenosis, lumbar region without neurogenic claudication: Secondary | ICD-10-CM | POA: Diagnosis not present

## 2020-11-24 DIAGNOSIS — M47816 Spondylosis without myelopathy or radiculopathy, lumbar region: Secondary | ICD-10-CM | POA: Diagnosis not present

## 2020-11-25 ENCOUNTER — Telehealth: Payer: Self-pay

## 2020-11-25 NOTE — Chronic Care Management (AMB) (Signed)
    Chronic Care Management Pharmacy Assistant   Name: Dorothy Parker  MRN: [id number] DOB: February 01, 1961  Reason for Encounter: COPD Disease State Call  Recent office visits:  No visits noted  Recent consult visits:  No visits noted  Hospital visits:  None in previous 6 months  Medications: Outpatient Encounter Medications as of 11/25/2020  Medication Sig   alendronate (FOSAMAX) 70 MG tablet Take 1 tablet (70 mg total) by mouth every 7 (seven) days. Take with a full glass of water on an empty stomach.   ascorbic acid (VITAMIN C) 500 MG tablet Take by mouth.   aspirin 81 MG EC tablet    glycopyrrolate (ROBINUL) 1 MG tablet TAKE TWO TABLETS TWICE DAILY   lidocaine (LIDODERM) 5 % Place 1 patch onto the skin daily.   metoprolol tartrate (LOPRESSOR) 25 MG tablet TAKE 1 TABLET BY MOUTH TWICE A DAY   naloxone (NARCAN) 4 MG/0.1ML LIQD nasal spray kit USE 1 SPRAY IN ONE NOSTRIL. MAY REPEAT IN OTHER NOSTRIL AFTER 2-3 MINS IF NEEDED   nitroGLYCERIN (NITROLINGUAL) 0.4 MG/SPRAY spray Place under the tongue.   oxyCODONE-acetaminophen (PERCOCET) 7.5-325 MG tablet Take 1 tablet by mouth 4 (four) times daily as needed.   quinapril (ACCUPRIL) 10 MG tablet TAKE 1 TABLET BY MOUTH DAILY   rosuvastatin (CRESTOR) 5 MG tablet TAKE 1 TABLET BY MOUTH DAILY.   STIOLTO RESPIMAT 2.5-2.5 MCG/ACT AERS INHALE 1 PUFF INTO THE LUNGS IN THE MORNING AND AT BEDTIME   triamcinolone cream (KENALOG) 0.1 % APPLY A THIN LAYER TO THE AFFECTED AREA(S) TWICE A DAY.   No facility-administered encounter medications on file as of 11/25/2020.    Current COPD regimen:  Stiolto 1 puff into the lungs in am and hs Patient reported periodically missing a dose if not needed  Any recent hospitalizations or ED visits since last visit with CPP? No  reports the following COPD symptoms, including Increased shortness of breath due to humidity   What recent interventions/DTPs have been made by any provider to improve breathing since last  visit: No recent interventions  Have you had exacerbation/flare-up since last visit? Yes, at least 5 x week  What do you do when you are short of breath?  Rest  Current tobacco use? Yes, 1.5 packs/ day Interested in cessation? Yes, previously tried patches and would like further recommendations  Respiratory Devices/Equipment Do you have a nebulizer? No Do you use a Peak Flow Meter? No Do you use a maintenance inhaler? Yes How often do you forget to use your daily inhaler? Patient uses inhaler at least once daily Do you use a rescue inhaler? No How often do you use your rescue inhaler?  never Do you use a spacer with your inhaler? No  Adherence Review: Does the patient have >5 day gap between last estimated fill date for maintenance inhaler medications? No Stiolto - 30 DS last filled 10/17/20 Patient reported last filled 11/10/20  Care Gaps: Last annual wellness visit? 10/25/19  Star Rating Drugs: Rosuvastatin 5 mg- 90 DS last filled 10/08/20  [facility] CPA, CMA

## 2020-12-01 ENCOUNTER — Ambulatory Visit (INDEPENDENT_AMBULATORY_CARE_PROVIDER_SITE_OTHER): Payer: Medicare HMO

## 2020-12-01 DIAGNOSIS — M810 Age-related osteoporosis without current pathological fracture: Secondary | ICD-10-CM | POA: Diagnosis not present

## 2020-12-01 DIAGNOSIS — F17210 Nicotine dependence, cigarettes, uncomplicated: Secondary | ICD-10-CM | POA: Diagnosis not present

## 2020-12-01 DIAGNOSIS — F17219 Nicotine dependence, cigarettes, with unspecified nicotine-induced disorders: Secondary | ICD-10-CM

## 2020-12-01 DIAGNOSIS — F17200 Nicotine dependence, unspecified, uncomplicated: Secondary | ICD-10-CM

## 2020-12-01 NOTE — Progress Notes (Signed)
Chronic Care Management Pharmacy Note  12/01/2020 Name:  DANASHA MELMAN MRN:  [id number] DOB:  09-Apr-1961  Subjective: Dorothy Parker is an 60 y.o. year old female who is a primary patient of Henrene Pastor, Zeb Comfort, MD.  The CCM team was consulted for assistance with disease management and care coordination needs.    Plan Updates:  Patient expressed interest in help with smoking cessation during call with pharmacy team. Pharmacist reached out to discuss options and assess readiness/confidence. Smoking 2 packs per day. Patient has tried to cut back unsuccessfully. She has tried and failed, bupropion, Chantix, Nicorette gum and nicotine patch. Interested in coverage of nicotrol inhaler.  Pharmacist completed prior authorization and insurance denied unless patient has tried and failed nicotrol nasal spray. Will discuss with patient and Dr. Henrene Pastor. Patient has visit with Dr. Henrene Pastor 08/29. Pharmacist discussed the benefits of having something in her hand like a pretzel stick or piece of licorice.  Drinking boost for nutrition. Has lost down to 93 lbs. Has very little appetite.   Engaged with patient by telephone for follow up visit in response to provider referral for pharmacy case management and/or care coordination services.   Consent to Services:  The patient was given the following information about Chronic Care Management services today, agreed to services, and gave verbal consent: 1. CCM service includes personalized support from designated clinical staff supervised by the primary care provider, including individualized plan of care and coordination with other care providers 2. 24/7 contact phone numbers for assistance for urgent and routine care needs. 3. Service will only be billed when office clinical staff spend 20 minutes or more in a month to coordinate care. 4. Only one practitioner may furnish and bill the service in a calendar month. 5.The patient may stop CCM services at any time  (effective at the end of the month) by phone call to the office staff. 6. The patient will be responsible for cost sharing (co-pay) of up to 20% of the service fee (after annual deductible is met). Patient agreed to services and consent obtained.  Patient Care Team: Lillard Anes, MD as PCP - General (Family Medicine) Flossie Buffy., MD as Referring Physician (Cardiology) Dorthula Rue, MD as Referring Physician (Vascular Surgery) Duanne Limerick, [facility] as Consulting Physician (Ophthalmology) Burnice Logan, [facility] as Pharmacist (Pharmacist)  Recent office visits:  07/30/20- order placed for bone density   06/16/20. Dr. Henrene Pastor PCP, hyperlipidemia, follow up 22mo, no medication changes, CBC normal, kidney and liver tests normal, Cholesterol normal,  CCM referral, follow up 65mo   Recent consult visits:  11/06/2020 - Vascular -  continue antiplatelet and statin. Repeat carotid duplex in 1 year if no symptoms.   08/18/2020 - cardiology - recommend smoking cessation. Recommend continuing to follow-up with vascular/doppler scan.   Hospital visits:  None in previous 6 months  Objective:  Lab Results  Component Value Date   CREATININE 0.62 06/16/2020   BUN 15 06/16/2020   GFRNONAA 97 12/17/2019   GFRAA 112 12/17/2019   NA 146 (H) 06/16/2020   K 4.0 06/16/2020   CALCIUM 9.7 06/16/2020   CO2 23 06/16/2020   GLUCOSE 91 06/16/2020    No results found for: HGBA1C, FRUCTOSAMINE, GFR, MICROALBUR  Last diabetic Eye exam: No results found for: HMDIABEYEEXA  Last diabetic Foot exam: No results found for: HMDIABFOOTEX   Lab Results  Component Value Date   CHOL 135 06/16/2020   HDL 59 06/16/2020   LDLCALC 55  06/16/2020   TRIG 122 06/16/2020   CHOLHDL 2.3 06/16/2020    Hepatic Function Latest Ref Rng & Units 06/16/2020 12/17/2019 08/16/2019  Total Protein 6.0 - 8.5 g/dL 7.0 6.5 6.6  Albumin 3.8 - 4.8 g/dL 4.6 4.4 4.4  AST 0 - 40 IU/L _0 ALT 0 - 32 IU/L _1 Alk Phosphatase 44 - 121 IU/L 94 97 105  Total Bilirubin 0.0 - 1.2 mg/dL 0.4 0.4 0.5    Lab Results  Component Value Date/Time   TSH 0.688 12/17/2019 09:34 AM    CBC Latest Ref Rng & Units 06/16/2020 12/17/2019 08/16/2019  WBC 3.4 - 10.8 x10E3/uL 8.0 7.6 7.0  Hemoglobin 11.1 - 15.9 g/dL 14.8 13.8 13.9  Hematocrit 34.0 - 46.6 % 43.7 40.3 40.3  Platelets 150 - 450 x10E3/uL 214 204 211    No results found for: VD25OH  Clinical ASCVD: Yes  The 10-year ASCVD risk score Mikey Bussing DC Jr., et al., 2013) is: 12%   Values used to calculate the score:     Age: 75 years     Sex: Female     Is Non-Hispanic African American: No     Diabetic: No     Tobacco smoker: Yes     Systolic Blood Pressure: 163 mmHg     Is BP treated: Yes     HDL Cholesterol: 59 mg/dL     Total Cholesterol: 135 mg/dL    Depression screen [facility] 2/9 06/16/2020 12/17/2019 10/25/2019  Decreased Interest 0 1 0  Down, Depressed, Hopeless 1 0 0  PHQ - 2 Score 1 1 0  Altered sleeping 2 2 -  Tired, decreased energy 2 2 -  Change in appetite 1 0 -  Feeling bad or failure about yourself  0 0 -  Trouble concentrating 0 0 -  Moving slowly or fidgety/restless 0 0 -  Suicidal thoughts 0 0 -  PHQ-9 Score 6 5 -  Difficult doing work/chores Somewhat difficult Somewhat difficult -     Social History   Tobacco Use  Smoking Status Every Day   Packs/day: 1.50   Years: 45.00   Pack years: 67.50   Types: Cigarettes  Smokeless Tobacco Never   BP Readings from Last 3 Encounters:  06/16/20 (!) 140/58  12/17/19 120/66  10/25/19 118/60   Pulse Readings from Last 3 Encounters:  06/16/20 (!) 55  12/17/19 70  10/25/19 (!) 46   Wt Readings from Last 3 Encounters:  06/16/20 96 lb (43.5 kg)  12/17/19 99 lb 6.4 oz (45.1 kg)  10/25/19 97 lb 3.2 oz (44.1 kg)   BMI Readings from Last 3 Encounters:  06/16/20 17.01 kg/m  12/17/19 16.80 kg/m  10/25/19 16.17 kg/m    Assessment/Interventions: Review of patient past medical history,  allergies, medications, health status, including review of consultants reports, laboratory and other test data, was performed as part of comprehensive evaluation and provision of chronic care management services.   SDOH:  (Social Determinants of Health) assessments and interventions performed: Yes  SDOH Screenings   Alcohol Screen: Not on file  Depression (PHQ2-9): Medium Risk   PHQ-2 Score: 6  Financial Resource Strain: Not on file  Food Insecurity: No Food Insecurity   Worried About Charity fundraiser in the Last Year: Never true   Ran Out of Food in the Last Year: Never true  Housing: Low Risk    Last Housing Risk Score: 0  Physical Activity: Not on file  Social Connections:  Not on file  Stress: Not on file  Tobacco Use: High Risk   Smoking Tobacco Use: Every Day   Smokeless Tobacco Use: Never  Transportation Needs: No Transportation Needs   Lack of Transportation (Medical): No   Lack of Transportation (Non-Medical): No    CCM Care Plan  Allergies  Allergen Reactions   Bupropion Other (See Comments)   Clarithromycin Other (See Comments)   Clopidogrel Other (See Comments)   Varenicline Other (See Comments)   Vitamin D Analogs Rash    High dosage of Vitamin D will cause rash High dosage of Vitamin D will cause rash High dosage of Vitamin D will cause rash    Prednisone Other (See Comments)    Hyperglycemia    Medications Reviewed Today     Reviewed by Burnice Logan, [facility] (Pharmacist) on 08/05/20 at 1041  Med List Status: <None>   Medication Order Taking? Sig Documenting Provider Last Dose Status Informant  ascorbic acid (VITAMIN C) 500 MG tablet [id number] Yes Take by mouth. [provider] Taking Active   aspirin 81 MG EC tablet [id number] Yes  [provider] Taking Active   glycopyrrolate (ROBINUL) 1 MG tablet [id number] Yes TAKE TWO TABLETS TWICE DAILY Lillard Anes, MD Taking Active   lidocaine (LIDODERM) 5 % [id number] Yes Place 1  patch onto the skin daily. [provider] Taking Active   metoprolol tartrate (LOPRESSOR) 25 MG tablet [id number] Yes TAKE 1 TABLET BY MOUTH TWICE A DAY Lillard Anes, MD Taking Active   naloxone [facility]) 4 MG/0.1ML LIQD nasal spray kit [id number] Yes USE 1 SPRAY IN ONE NOSTRIL. MAY REPEAT IN OTHER NOSTRIL AFTER 2-3 MINS IF NEEDED [provider] Taking Active   nitroGLYCERIN (NITROLINGUAL) 0.4 MG/SPRAY spray [id number] Yes Place under the tongue. [provider] Taking Active   oxyCODONE-acetaminophen (PERCOCET) 7.5-325 MG tablet [id number] Yes Take 1 tablet by mouth 4 (four) times daily as needed. [provider] Taking Active   quinapril (ACCUPRIL) 10 MG tablet [id number] Yes TAKE 1 TABLET BY MOUTH TWICE DAILY  Patient taking differently: Take 10 mg by mouth daily.   Lillard Anes, MD Taking Active   rosuvastatin (CRESTOR) 5 MG tablet [id number] Yes Take 1 tablet (5 mg total) by mouth daily. Lillard Anes, MD Taking Active   STIOLTO RESPIMAT 2.5-2.5 MCG/ACT AERS [id number] Yes INHALE 1 PUFF INTO THE LUNGS IN THE MORNING AND AT BEDTIME. Lillard Anes, MD Taking Active   triamcinolone (KENALOG) 0.1 % [id number] Yes APPLY A THIN LAYER TO THE AFFECTED AREA(S) TWICE A DAY. Lillard Anes, MD Taking Active             Patient Active Problem List   Diagnosis Date Noted   Osteoporosis 08/12/2020   Adult BMI <19 kg/sq m 08/16/2019   Malnutrition of moderate degree ([place]) 08/16/2019   COPD (chronic obstructive pulmonary disease) (HCC)    Mixed hyperlipidemia    Major depressive disorder, single episode, moderate (HCC)    Tobacco use disorder 10/03/2018   Bilateral carotid artery stenosis 01/04/2018   Hx of CABG 12/02/2016   Essential hypertension 11/17/2015   CAD in native artery 11/17/2015    Immunization History  Administered Date(s) Administered   Influenza-Unspecified 02/02/2019, 03/12/2020   Pneumococcal  Conjugate-13 01/27/2015   Pneumococcal Polysaccharide-23 01/27/2016    Conditions to be addressed/monitored:  Hypertension, Hyperlipidemia, Coronary Artery Disease, COPD and Depression  There are no care plans that you recently modified to display for this patient.  Medication Assistance: None required.  Patient affirms current coverage meets needs.  Patient's preferred pharmacy is:  [facility], [place] [address] Phone: [phone] Fax: [phone]  Uses pill box? No - keeps stored on shelf in medicine cabinet Pt endorses good compliance  We discussed: Benefits of medication synchronization, packaging and delivery as well as enhanced pharmacist oversight with [employer]. Patient decided to: Continue current medication management strategy  Care Plan and Follow Up Patient Decision:  Patient agrees to Care Plan and Follow-up.  Plan: Telephone follow up appointment with care management team member scheduled for:  Pharmacy Team will follow-up with smoking cessation progress 12/2020. Pharmacist will follow-up on smoking cessation 01/2021. If patient has tried and failed nicotine nasal spray, Nicotine inhaler prior authorization can be completed.

## 2020-12-02 ENCOUNTER — Other Ambulatory Visit (INDEPENDENT_AMBULATORY_CARE_PROVIDER_SITE_OTHER): Payer: Medicare Other | Attending: Interventional Cardiology

## 2020-12-02 ENCOUNTER — Telehealth: Payer: Self-pay

## 2020-12-02 DIAGNOSIS — M546 Pain in thoracic spine: Secondary | ICD-10-CM | POA: Diagnosis not present

## 2020-12-02 DIAGNOSIS — G894 Chronic pain syndrome: Secondary | ICD-10-CM | POA: Diagnosis not present

## 2020-12-02 DIAGNOSIS — M5090 Cervical disc disorder, unspecified, unspecified cervical region: Secondary | ICD-10-CM | POA: Diagnosis not present

## 2020-12-02 DIAGNOSIS — M47816 Spondylosis without myelopathy or radiculopathy, lumbar region: Secondary | ICD-10-CM | POA: Diagnosis not present

## 2020-12-02 DIAGNOSIS — M461 Sacroiliitis, not elsewhere classified: Secondary | ICD-10-CM | POA: Diagnosis not present

## 2020-12-02 DIAGNOSIS — M5136 Other intervertebral disc degeneration, lumbar region: Secondary | ICD-10-CM | POA: Diagnosis not present

## 2020-12-02 DIAGNOSIS — M48061 Spinal stenosis, lumbar region without neurogenic claudication: Secondary | ICD-10-CM | POA: Diagnosis not present

## 2020-12-02 DIAGNOSIS — Z8249 Family history of ischemic heart disease and other diseases of the circulatory system: Secondary | ICD-10-CM

## 2020-12-02 DIAGNOSIS — Z0189 Encounter for other specified special examinations: Secondary | ICD-10-CM

## 2020-12-02 LAB — COMPREHENSIVE METABOLIC PANEL, BLOOD
ALT (SGPT): 24 U/L (ref 0–33)
AST (SGOT): 37 U/L — ABNORMAL HIGH (ref 0–32)
Albumin: 4.8 g/dL (ref 3.5–5.2)
Alkaline Phos: 69 U/L (ref 40–130)
Anion Gap: 9 mmol/L (ref 7–15)
BUN: 7 mg/dL — ABNORMAL LOW (ref 8–23)
Bicarbonate: 27 mmol/L (ref 22–29)
Bilirubin, Tot: 0.34 mg/dL (ref <1.2)
Calcium: 9.9 mg/dL (ref 8.5–10.6)
Chloride: 101 mmol/L (ref 98–107)
Creatinine: 0.82 mg/dL (ref 0.51–0.95)
GFR: >60 mL/min
Glucose: 93 mg/dL (ref 70–99)
Potassium: 4.5 mmol/L (ref 3.5–5.1)
Sodium: 137 mmol/L (ref 136–145)
Total Protein: 7.1 g/dL (ref 6.0–8.0)
eGFR Based on CKD-EPI 2021 Equation: >60 mL/min

## 2020-12-02 LAB — LIPID(CHOL FRACT) PANEL, BLOOD
Cholesterol: 283 mg/dL — ABNORMAL HIGH (ref <200)
HDL-Cholesterol: 101 mg/dL
LDL-Chol (Calc): 159 mg/dL (ref <160)
Non-HDL Cholesterol: 182 mg/dL
Triglycerides: 117 mg/dL (ref 10–170)

## 2020-12-02 LAB — CRP HS, BLOOD: CRP HS: 0.5 mg/L (ref 0.0–4.9)

## 2020-12-02 NOTE — Patient Instructions (Signed)
Visit Information   Goals Addressed             This Visit's Progress    Improve My Heart Health-Coronary Artery Disease       Timeframe:  Long-Range Goal Priority:  High Start Date:                             Expected End Date:                       Follow Up Date 06/2021  - be open to making changes - learn about small changes that will make a big difference    Why is this important?   Lifestyle changes are key to improving the blood flow to your heart. Think about the things you can change and set a goal to live healthy.  Remember, when the blood vessels to your heart start to get clogged you may not have any symptoms.  Over time, they can get worse.  Don't ignore the signs, like chest pain, and get help right away.     Notes:        Patient Care Plan: CCM Pharmacy Care Plan     Problem Identified: cad, htn, hld, copd   Priority: High  Onset Date: 08/05/2020     Long-Range Goal: Disease Management   Start Date: 08/05/2020  Expected End Date: 08/05/2021  Recent Progress: On track  Priority: High  Note:   Current Barriers:  Patient is interested in support for smoking cessation but has tried and failed various products in the past   Pharmacist Clinical Goal(s):  Patient will achieve control of smoking cessation as evidenced by continued use and recommended treatment through collaboration with PharmD and provider.   Interventions: 1:1 collaboration with Abigail Miyamoto, MD regarding development and update of comprehensive plan of care as evidenced by provider attestation and co-signature Inter-disciplinary care team collaboration (see longitudinal plan of care) Comprehensive medication review performed; medication list updated in electronic medical record  Hypertension (BP goal <130/80) -Not ideally controlled -Current treatment: metoprolol tartrate 25 mg bid  Quinapril 10 mg daily  -Medications previously tried: none reported -Current home readings:  120-140s/60-80s mmHg -Current dietary habits: Lives alone and doesn't cook for herself a lot. Fish and shrimp yesterday. Filet mignon in airfryer on Sunday. Toast streudel, cereal or bacon and eggs for breakfast. Likes fruits and vegetables but hard to use before they go bad.  -Current exercise habits: Uses above ground pool at her home during warmer weather. Back pain limits exercise.  -Denies hypotensive/hypertensive symptoms -Educated on BP goals and benefits of medications for prevention of heart attack, stroke and kidney damage; Daily salt intake goal < 2300 mg; Exercise goal of 150 minutes per week; Importance of home blood pressure monitoring; -Counseled to monitor BP at home occasionally, document, and provide log at future appointments -Counseled on diet and exercise extensively Collaborated with nurse to have updated prescription for quinapril once daily to pharmacy. Patient is not checking bp regularly but has occasional reading above goal. Patient continues to smoke and discussed smoking cessation.   Hyperlipidemia/CAD: (LDL goal < 55) -Controlled -Current treatment: aspirin 81 mg ec  crestor 5 mg daily  Nitroglycerin 0.4 mg/spray prn -Medications previously tried: none reported  -Current dietary patterns: Drinks 2% milk daily. Rarely eats cheese.  -Current exercise habits: limited due to back pain.  -Educated on Cholesterol goals;  Benefits of statin  for ASCVD risk reduction; Importance of limiting foods high in cholesterol; Exercise goal of 150 minutes per week; -Counseled on diet and exercise extensively Recommended to continue current medication  COPD (Goal: control symptoms and prevent exacerbations) -Controlled -Current treatment  Stiolto 1 puff into the lungs in am and hs -Medications previously tried: none reported  -Gold Grade: Gold 2 (FEV1 50-79%) -Pulmonary function testing: 08/16/2019 - 61% -Exacerbations requiring treatment in last 6 months: none  reported -Patient reports consistent use of maintenance inhaler -Frequency of rescue inhaler use: none per patient  -Counseled on Benefits of consistent maintenance inhaler use -Recommended to continue current medication  Osteoporosis / Osteopenia (Goal prevent fractures) -Not ideally controlled -Last DEXA Scan: 07/2020   T-Score femoral neck: -3.2  T-Score lumbar spine: -3.5 -Patient is a candidate for pharmacologic treatment due to T-Score < -2.5 in lumbar spine -Current treatment  Alendronate 70 mg weekly  Calcium with D supplementation recommended by Dr. Marina Goodell and pharmacist  -Medications previously tried: n/a  -Recommend [phone] units of vitamin D daily. Recommend 1200 mg of calcium daily from dietary and supplemental sources. Recommend weight-bearing and muscle strengthening exercises for building and maintaining bone density. -Counseled on diet and exercise extensively Educated on calcium and vitamin d recommendations. Patient does drink boost but has very little intake of food due to appetite.   Tobacco use (Goal smoking cessation ) -Uncontrolled - smoking 2 packs per day -Previous quit attempts: Chantix - allergic reaction, patches - didn't work, wellbutrin, gum - dentures may prohibit chewing and parking -Current treatment  None reported -Patient smokes Within 30 minutes of waking -Patient triggers include: watching television, driving and finishing a meal -On a scale of 1-10, reports MOTIVATION to quit is 6 -On a scale of 1-10, reports CONFIDENCE in quitting is 6 -Provided contact information for [place] Quit Line (1-800-QUIT-NOW) and encouraged patient to reach out to this group for support. -Counseled on benefits of smoking cessation. Discussed options of smoking cessation and patient is interested in inhaler. Insurance will not approve inhaler until nasal spray has been tried. Will discuss with patient and then Dr. Marina Goodell if patient is ready to proceed.   Pain (Goal: manage  symptoms of pain with specialist) -Controlled -Current treatment  Oxycodone-acetaminophen 7.5-325 mg QID  Naloxone 4 mg/0.1 ml nasal spray 1 spray in one nostril may repeat in other nostril after 2-3 minutes if needed -Medications previously tried: none reported   -Recommended to continue current medication  Sweating (Goal: minimize sweating) -Controlled -Current treatment  glycopyrrolate 1 mg 2 tablets twice daily  -Medications previously tried: none reported  -Recommended to continue current medication  Health Maintenance -Vaccine gaps: COVID - not interested, Shingrix - hasn't had , TDAP - patient not interested at this time -Current therapy:  vitamin c 500 mg daily -Educated on Cost vs benefit of each product must be carefully weighed by individual consumer -Patient is satisfied with current therapy and denies issues -Recommended to continue current medication    Patient Goals/Self-Care Activities Patient will:  - take medications as prescribed focus on medication adherence by considering a pill box.  check blood pressure weekly, document, and provide at future appointments engage in dietary modifications by limiting salt and focusing on heart-healthy diet.   Follow Up Plan: Telephone follow up appointment with care management team member scheduled [id number] team 12/2020 to follow-up on smoking cessation and Pharmacist 01/2021 to follow-up on smoking cessation       Patient verbalizes understanding of instructions provided today and  agrees to view in [place].  Telephone follow up appointment with pharmacy team member scheduled for: 12/2020 with pharmacy team to assess smoking cessation and 01/2021 with pharmacist to support and assess smoking cessation  Earvin Hansen, [facility]

## 2020-12-02 NOTE — Progress Notes (Signed)
  Ms. Shanautica returned my call and I informed her that her insurance requires her to try nicotine nasal spray prior to an inhaler being approved. She requested that the nasal spray be requested prior to her appointment with PCP 08/29. She was also researching alternate cessation medications and came across Varenicline. She would like to know if this would be an appropriate option for her to try.   Purvis Kilts CPA, CMA

## 2020-12-08 ENCOUNTER — Other Ambulatory Visit: Payer: Self-pay

## 2020-12-08 ENCOUNTER — Ambulatory Visit (INDEPENDENT_AMBULATORY_CARE_PROVIDER_SITE_OTHER): Payer: Medicare Other | Admitting: Ophthalmology

## 2020-12-08 ENCOUNTER — Encounter (INDEPENDENT_AMBULATORY_CARE_PROVIDER_SITE_OTHER): Payer: Medicare Other | Admitting: Interventional Cardiology

## 2020-12-08 DIAGNOSIS — H209 Unspecified iridocyclitis: Secondary | ICD-10-CM

## 2020-12-08 DIAGNOSIS — H2511 Age-related nuclear cataract, right eye: Secondary | ICD-10-CM

## 2020-12-08 MED ORDER — PREDNISOLONE ACETATE 1 % OP SUSP
1.0000 [drp] | Freq: Four times a day (QID) | OPHTHALMIC | 3 refills | Status: AC
Start: 2020-12-08 — End: 2021-01-07

## 2020-12-08 NOTE — Result Encounter Note (Signed)
Can arrange video visit to discuss statin therapy  in 1-3 months. thx

## 2020-12-08 NOTE — Assessment & Plan Note (Addendum)
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 Mildly active today pending repeat Rituxan q 6 months, has been 6 months since last infusion, rec restart PF daily or bid for now pending Rituxan. Improved VA OD, patient happy with improvement s/p CE/IOL OD 09/2020. High risk of vision loss from uncontrolled uveitis, agree with systemic treatment. Uveitis precautions emphasized. Recommendations discussed with outside providers.    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 OCT mac w/o edema ou today.

## 2020-12-09 ENCOUNTER — Telehealth (HOSPITAL_BASED_OUTPATIENT_CLINIC_OR_DEPARTMENT_OTHER): Payer: Self-pay

## 2020-12-09 ENCOUNTER — Telehealth (INDEPENDENT_AMBULATORY_CARE_PROVIDER_SITE_OTHER): Payer: Medicare Other | Admitting: Internal Medicine

## 2020-12-09 ENCOUNTER — Ambulatory Visit (HOSPITAL_BASED_OUTPATIENT_CLINIC_OR_DEPARTMENT_OTHER): Payer: Medicare Other

## 2020-12-09 ENCOUNTER — Telehealth (INDEPENDENT_AMBULATORY_CARE_PROVIDER_SITE_OTHER): Payer: Self-pay | Admitting: Internal Medicine

## 2020-12-09 ENCOUNTER — Encounter (INDEPENDENT_AMBULATORY_CARE_PROVIDER_SITE_OTHER): Payer: Self-pay | Admitting: Internal Medicine

## 2020-12-09 DIAGNOSIS — R590 Localized enlarged lymph nodes: Secondary | ICD-10-CM

## 2020-12-09 DIAGNOSIS — H209 Unspecified iridocyclitis: Secondary | ICD-10-CM

## 2020-12-09 DIAGNOSIS — Z79899 Other long term (current) drug therapy: Secondary | ICD-10-CM

## 2020-12-09 DIAGNOSIS — D8989 Other specified disorders involving the immune mechanism, not elsewhere classified: Secondary | ICD-10-CM

## 2020-12-09 DIAGNOSIS — K1120 Sialoadenitis, unspecified: Secondary | ICD-10-CM

## 2020-12-09 DIAGNOSIS — D84821 Immunodeficiency due to drugs (CMS-HCC): Secondary | ICD-10-CM

## 2020-12-09 DIAGNOSIS — G35 Multiple sclerosis: Secondary | ICD-10-CM

## 2020-12-09 NOTE — Progress Notes (Signed)
Due to the COVID-19 pandemic and high risk of community-acquired infection with patient travel, I am proceeding with this telemedicine evaluation at the request of the patient.     Patient Verification & Telemedicine Consent:    I am proceeding with this evaluation at the direct request of the patient.  I have verified this is the correct patient and have obtained verbal consent and written consent from the patient/ surrogate to perform this voluntary telemedicine evaluation (including obtaining history, performing examination and reviewing data provided by the patient).   The patient/ surrogate has the right to refuse this evaluation.  I have explained risks (including potential loss of confidentiality), benefits, alternatives, and the potential need for subsequent face to face care. Patient/ surrogate understands that there is a risk of medical inaccuracies given that our recommendations will be made based on reported data (and we must therefore assume this information is accurate).  Knowing that there is a risk that this information is not reported accurately, and that the telemedicine video, audio, or data feed may be incomplete, the patient agrees to proceed with evaluation and holds [place] harmless knowing these risks. In this evaluation, we will be providing recommendations only. The patient/ surrogate has been notified that other healthcare professionals (including students, residents and Metallurgist) may be involved in this audio-video evaluation.   All laws concerning confidentiality and patient access to medical records and copies of medical records apply to telemedicine.  The patient/ surrogate has received the [place] Notice of Privacy Practices.  I have reviewed this above verification and consent paragraph with the patient/ surrogate.  If the patient is not capacitated to understand the above, and no surrogate is available, since this is not an emergency evaluation, the visit will be rescheduled  until such time that the patient can consent, or the surrogate is available to consent.    Demographics:   Medical Record #: [id number]   Date: December 09, 2020   Patient Name: Debra Bolton   DOB: Nov 06, 1960  Age: 60 year old  Sex: female  Location: Home address on file    Evaluator(s):   Sakira Denunzio was evaluated by me today.    Clinic Location: [facility]  [address] [place]  [place] [address]    Chief Complaint  No chief complaint on file.  1. Multiple Sclerosis, relapsing form  2. Anterior uveitis  3. IgG related lymphadenopathy, chronic sialoadenitis    History of present Illness  60 year old female with history of multiple sclerosis diagnosed 1991, presented with numbness in both legs, seizures of 4 years duration.  Multiple relapses since then.  Previously treated with Copaxone.  Started Natalizumab in 2014 with good response and improvement in pain and numbness in the extremities.  Currently taking Cymbalta and gabapentin which appear to help with pain in her extremities.    History of anterior uveitis in 2018, treated with topical corticosteroids.  Seen by Ophthalmology at [facility].     She is a chronic heavy smoker, around 2017, developed change in voice with cervical lymphadenopathy.  Underwent removal of enlarged tonsil and cervical lymph node to rule out malignancy.  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  CT chest abdomen pelvis 08/11/2015 which was negative for lymphadenopathy and for retroperitoneal fibrosis.  No pancreatitis.  Neck ultrasound showed bilateral submandibular gland enlargement with heterogeneous echotexture, likely secondary to  IgG4 related disease    Switched from natalizumab to rituximab for multiple sclerosis, in light of history of uveitis and IgG4 related disease, by Neurology.  Received rituximab 500 mg IV 2 doses 2 weeks apart in March 2019  and September 2019  1 g IV single dose June 2020, April 2021, Feb 2022    Remains on rituximab every 6 months, however delayed last dose d/t pandemic . Tolerating rituximab well.  Overall felt better with respect to energy levels, submandibular pain and swelling after the infusion.  Abdominal pain has resolved.     Recurrence of uveitis in 06/2019, treated with pred forte.  Underwent rituximab April 2021 and February 2022  Mild increase in uveitis in the last few weeks, using Pred Forte eyedrops  Previously resolved with repeat rituximab    Stable chronic arthralgias, fatigue, voice hoarseness, submandibular swelling.  Stable  Denies fever and weight loss  No infectious complications  OFBPZ-02 booster dose 2022    Review of Systems  As per HPI    Medications and Allergies were reviewed with the patient.       Current Outpatient Medications:     albuterol (VENTOLIN HFA) 108 (90 Base) MCG/ACT inhaler, Inhale 2 puffs by mouth every 6 hours as needed for Wheezing., Disp: 3 each, Rfl: 1    Amantadine HCl 100 MG tablet, Take 100 mg by mouth 2 times daily. Takes PRN, Disp: 60 tablet, Rfl: 0    atropine (ISOPTO) 1 % ophthalmic solution, 1 drop to operated eye bid. Use this drop after surgery., Disp: 5 mL, Rfl: 3    baclofen (LIORESAL) 10 MG tablet, TAKE 1 TABLET BY MOUTH EVERY DAY NIGHTLY, Disp: 30 tablet, Rfl: 11    buPROPion (WELLBUTRIN) 300 MG Extended-Release tablet, TAKE 1 TABLET BY MOUTH EVERY MORNING, Disp: 90 tablet, Rfl: 1    diazepam (VALIUM) 5 MG tablet, Take 1 tablet (5 mg) by mouth daily as needed for Anxiety., Disp: 7 tablet, Rfl: 0    DULoxetine (CYMBALTA) 60 MG CR capsule, Take 2 capsules (120 mg) by mouth daily., Disp: 180 capsule, Rfl: 11    gabapentin (NEURONTIN) 300 MG capsule, Take 2 capsules (600 mg) by mouth nightly., Disp: 60 capsule, Rfl: 5    nortriptyline (PAMELOR) 25 MG capsule, Take 2 capsules (50 mg) by mouth nightly., Disp: 180 capsule, Rfl: 3    ofloxacin (OCUFLOX) 0.3 % ophthalmic  solution, 1 drop to operated eye qid. Use this drop after surgery., Disp: 5 mL, Rfl: 3    prednisoLONE acetate (PRED FORTE) 1 % ophthalmic suspension, Place 1 drop into both eyes 4 times daily for 30 days., Disp: 1 each, Rfl: 3    prednisoLONE acetate (PRED FORTE) 1 % ophthalmic suspension, 1 drop to operated eye qid. Use this drop after surgery., Disp: 5 mL, Rfl: 3    RITUXIMAB IV, Every 6 months., Disp: , Rfl:     thyroid (ARMOUR THYROID) 15 MG tablet, Take 1 tablet (15 mg) by mouth every other day., Disp: 45 tablet, Rfl: 2    thyroid (ARMOUR THYROID) 60 MG tablet, Take 1 tablet (60 mg) by mouth daily., Disp: 90 tablet, Rfl: 2    tiZANidine (ZANAFLEX) 4 MG tablet, Take 1 tablet (4 mg) by mouth at bedtime., Disp: 90 tablet, Rfl: 1    Allergies  Penicillins and Modafinil    Past Medical History:   Diagnosis Date    Asthma 1991    with pregnancy    Hypothyroidism  Major depressive disorder, single episode     MS (multiple sclerosis) (CMS-HCC)        Past Surgical History:   Procedure Laterality Date    leep      for cervical dysplasia    LYMPH NODE BIOPSY      IgG4 related disease    TONSILLECTOMY AND ADENOIDECTOMY      only tonsillectomy       Family History   Problem Relation Name Age of Onset    Other Mother Zara Chess     Thyroid Mother Zara Chess     Heart Disease Mother Velva Harman mcclain         heart failure    Cancer Father don mc clain         lung    Cancer Brother tom mcclain     Heart Disease Brother tom mcclain         heart attack    Cancer Sister Velva Harman mcclain         breast    Diabetes Sister suzanne mcclain     Thyroid Sister suzanne mcclain         car accident    Stroke Brother Don mcclain     Cancer Brother Don mcclain         prostate cancer    Cancer Brother john mc clain         copd lungs    Glaucoma Neg Hx      Macular Degeneration (AMD) Neg Hx       Social History     Socioeconomic History    Marital status: Divorced   Tobacco Use    Smoking status: Former  Smoker     Packs/day: 1.00     Years: 15.00     Pack years: 15.00     Types: Cigarettes     Quit date: 01/27/2016     Years since quitting: 4.8    Smokeless tobacco: Never Used   Substance and Sexual Activity    Alcohol use: Yes     Alcohol/week: 2.0 standard drinks     Types: 2 Glasses of wine per week     Comment: 2 glasses of wine per week    Drug use: Never    Sexual activity: Yes     Partners: Male     Birth control/protection: Rhythm   Other Topics Concern    Military Service No    Blood Transfusions No    Caffeine Concern No    Occupational Exposure No    Hobby Hazards No    Sleep Concern No    Stress Concern No    Weight Concern No    Special Diet No    Back Care No    Exercises Regularly Yes    Bike Helmet Use Yes    Seat Belt Use Yes    Performs Self-Exams Yes   Social History Narrative    Moved from [place] is great, high fiber, she is very conscientious of effect of diet on health.    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    Hasn't had a cigarette in 8 months.    Has smoked 26 years, less than a pack a day.    Currently drinks 2-4 drinks a day on the weekends, total 4-8 per week.    Sexually active with one female partner.    Currently not working, has been on disability for most of the time from 1992  to now.         Physical Exam  There were no vitals filed for this visit.  Gen: Alert, cooperative  Psych: Appropriate affect, pleasant  Neck: No visibly enlarged lymph nodes  Skin: No rheumatologic rashes or ulcers  Eyes: EOMI. No scleral icterus  ENT: No oral mucositis  Resp: Breathing comfortably, no tachypnea  Cardiac: Well perfused extremities, no cyanosis  Joint exam:  No visible joint swelling  Neuro: Oriented to time, place and person         Most recent labs and imaging were reviewed and discussed with the patient.  50 pages of outside records reviewed, outlined in HPI    08/08/2015  IgG4 level 215 upper limit 131m/dl  Total IgG 1050 upper limit 1590    01/12/17  IgG4 level  193  03/29/17:  IgG4 elevated at 205.  Normal IgG    03/2018 IgG4 163, normal IgG     Most recent labs reviewed with the patient  IgG4 135    10/21/20 labs reviewed- IgG4 130    Impression    ICD-10-CM ICD-9-CM   1. IgG4 related disease (CMS-HCC)  D89.89 279.8   2. Multiple sclerosis (CMS-HCC)  G35 340   3. Immunosuppression due to drug therapy (CMS-HCC)  D84.821 V58.69    Z79.899    4. Uveitis of right eye  H20.9 364.3   5. Sialadenitis  K11.20 527.2   6. Lymphadenopathy, cervical  R59.0 785.6       DTomara Youngbergis a 60year old female with multiple sclerosis, followed by Neurology.  Previously well controlled on treatment with natalizumab.    Switched to rituximab in March 2019 due to history of IgG4 related disease, with cervical lymphadenopathy, submandibular gland enlargement and recurrent uveitis of right eye.  Chronic nerve pain well controlled with Cymbalta and gabapentin.    IgG4 related disease on histopathology of enlarged right cervical lymph nodes.  No pancreatitis, no other systemic lymphadenopathy on CT chest abdomen pelvis performed April 2017.    History 2 episodes of anterior uveitis in 2018, no recurrence.  Negative ANA HLA B27, Qft TB    Last rituximab Feb 2022  Most recent IgG was 130, however clinically worsening with recurrence of uveitis    PLAN:   Repeat rituximab 1 g single dose next week   Repeat disease activity labs every 6 weeks    Orders Placed This Encounter   Procedures    CBC w/ Diff Lavender    CMP    C-Reactive Protein, Blood Green Plasma Separator Tube    Sedimentation Rate (ESR), Blood Lavender    IgG Subclass, Blood Yellow serum separator tube    IgG, Blood - See Instructions      Return in about 13 weeks (around 03/10/2021).     I personally spent total 40 minutes in face-to-face and non-face-to-face activities related to the patients visit today, excluding any separately reportable services/procedures.

## 2020-12-09 NOTE — Telephone Encounter (Signed)
T/C from pt stating Dr.Singh has put in an order for her to have an infusion at [facility] and she would like to schedule.    Pt can be reached at: [phone] (

## 2020-12-09 NOTE — Telephone Encounter (Signed)
LVM for pt to call back reg intake questions

## 2020-12-09 NOTE — Telephone Encounter (Signed)
Called patient to schedule RITUXAN for our [place] office. After confirming authorization. Made apt for Friday 8/26, per patient's request. Due to planning to be out of town the following week.

## 2020-12-09 NOTE — Interdisciplinary (Signed)
AVS Discharge instructions given to patient? Yes     Patient verbalized understanding of instructions given? Yes     Did patient have additional questions? No If yes, explain:     Next Scheduled Appointment 03/16/21    Patient was notified of appointment policy and cancellation options: Yes     In-Person No     MyChart Telemedicine Visit: Yes     Telephone Follow up: No

## 2020-12-09 NOTE — Addendum Note (Signed)
Addended by: Filiberto Pinks on: 12/09/2020 03:14 PM     Modules accepted: Orders

## 2020-12-09 NOTE — Telephone Encounter (Signed)
Returned patient's call, was requesting to schedule RUXIENCE. Was looking to schedule at [facility] office. Let her know IRS is currently pending. Will call her back once authorization is obtained.

## 2020-12-10 ENCOUNTER — Ambulatory Visit (HOSPITAL_BASED_OUTPATIENT_CLINIC_OR_DEPARTMENT_OTHER)
Admit: 2020-12-10 | Discharge: 2020-12-10 | Disposition: A | Discharge status: Home Health | Payer: Medicare Other | Attending: Family Practice | Admitting: Family Practice

## 2020-12-10 ENCOUNTER — Ambulatory Visit
Admission: RE | Admit: 2020-12-10 | Discharge: 2020-12-10 | Disposition: A | Discharge status: Home / Self Care | Payer: Medicare Other | Attending: Family Practice | Admitting: Family Practice

## 2020-12-10 DIAGNOSIS — R932 Abnormal findings on diagnostic imaging of liver and biliary tract: Secondary | ICD-10-CM | POA: Insufficient documentation

## 2020-12-10 DIAGNOSIS — Q790 Congenital diaphragmatic hernia: Secondary | ICD-10-CM | POA: Insufficient documentation

## 2020-12-10 DIAGNOSIS — K449 Diaphragmatic hernia without obstruction or gangrene: Secondary | ICD-10-CM

## 2020-12-10 MED ORDER — IOHEXOL 350 MG/ML IV SOLN
100.0000 mL | Freq: Once | INTRAVENOUS | Status: AC
Start: 2020-12-10 — End: 2020-12-10
  Administered 2020-12-10 (×2): 100 mL via INTRAVENOUS
  Filled 2020-12-10: qty 100

## 2020-12-11 ENCOUNTER — Encounter (INDEPENDENT_AMBULATORY_CARE_PROVIDER_SITE_OTHER): Payer: Self-pay | Admitting: Family Practice

## 2020-12-11 ENCOUNTER — Encounter (INDEPENDENT_AMBULATORY_CARE_PROVIDER_SITE_OTHER): Payer: Self-pay

## 2020-12-11 ENCOUNTER — Encounter (INDEPENDENT_AMBULATORY_CARE_PROVIDER_SITE_OTHER): Payer: Medicare Other | Admitting: Family Practice

## 2020-12-11 DIAGNOSIS — Z1389 Encounter for screening for other disorder: Secondary | ICD-10-CM

## 2020-12-11 DIAGNOSIS — R112 Nausea with vomiting, unspecified: Secondary | ICD-10-CM

## 2020-12-11 DIAGNOSIS — Z789 Other specified health status: Secondary | ICD-10-CM

## 2020-12-11 DIAGNOSIS — Z1339 Encounter for screening examination for other mental health and behavioral disorders: Secondary | ICD-10-CM

## 2020-12-11 NOTE — Progress Notes (Unsigned)
[place] Telephone Visit      ---------------------(data below generated by Olive Bass, MD)--------------------    Patient Verification & Telephone Based Visit Consent:    She agreed to the visit.     Demographics:   Medical Record #: [id number]   Date: December 11, 2020   Patient Name: Debra Bolton  DOB: 07/30/1960  Age: 60 year old  Sex: female  Location: Home Address on File    Evaluator(s):   Ladean Sebastiano was evaluated by me today.    Clinic Location: [facility]  [address], [address] [place] [address]      Telephone Visit:    [name] is a 60 year old female female who is attending a virtual based telephone visit.    The Telephone Visit was 9 minutes.       HPI:  Patient having N/v of unclear etiology.   Very concerned about her liver based on previous results/what someone said.   Primarily wants to discuss results.   Today not having symptoms .   Usually sees Dr. Rana Snare.           Patient Active Problem List   Diagnosis    Multiple sclerosis (CMS-HCC)    IgG4 related disease (CMS-HCC)    Uveitis of right eye    Posterior subcapsular age-related cataract, right eye    Mood disorder (CMS-HCC)    PTSD (post-traumatic stress disorder)    Adequate exercise, at or above goal    Rotator cuff tear    Dental caries, unspecified    Deposits (accretions) on teeth    Rectal bleeding    Inadequate exercise - not at goal           Current Outpatient Medications on File Prior to Visit   Medication Sig Dispense Refill    albuterol (VENTOLIN HFA) 108 (90 Base) MCG/ACT inhaler Inhale 2 puffs by mouth every 6 hours as needed for Wheezing. 3 each 1    Amantadine HCl 100 MG tablet Take 100 mg by mouth 2 times daily. Takes PRN 60 tablet 0    atropine (ISOPTO) 1 % ophthalmic solution 1 drop to operated eye bid. Use this drop after surgery. 5 mL 3    baclofen (LIORESAL) 10 MG tablet TAKE 1 TABLET BY MOUTH EVERY DAY NIGHTLY 30 tablet 11     buPROPion (WELLBUTRIN) 300 MG Extended-Release tablet TAKE 1 TABLET BY MOUTH EVERY MORNING 90 tablet 1    diazepam (VALIUM) 5 MG tablet Take 1 tablet (5 mg) by mouth daily as needed for Anxiety. 7 tablet 0    DULoxetine (CYMBALTA) 60 MG CR capsule Take 2 capsules (120 mg) by mouth daily. 180 capsule 11    gabapentin (NEURONTIN) 300 MG capsule Take 2 capsules (600 mg) by mouth nightly. 60 capsule 5    nortriptyline (PAMELOR) 25 MG capsule Take 2 capsules (50 mg) by mouth nightly. 180 capsule 3    ofloxacin (OCUFLOX) 0.3 % ophthalmic solution 1 drop to operated eye qid. Use this drop after surgery. 5 mL 3    prednisoLONE acetate (PRED FORTE) 1 % ophthalmic suspension Place 1 drop into both eyes 4 times daily for 30 days. 1 each 3    prednisoLONE acetate (PRED FORTE) 1 % ophthalmic suspension 1 drop to operated eye qid. Use this drop after surgery. 5 mL 3    RITUXIMAB IV Every 6 months.      thyroid (  ARMOUR THYROID) 15 MG tablet Take 1 tablet (15 mg) by mouth every other day. 45 tablet 2    thyroid (ARMOUR THYROID) 60 MG tablet Take 1 tablet (60 mg) by mouth daily. 90 tablet 2    tiZANidine (ZANAFLEX) 4 MG tablet Take 1 tablet (4 mg) by mouth at bedtime. 90 tablet 1     No current facility-administered medications on file prior to visit.         Psychosocial:  Social History     Socioeconomic History    Marital status: Divorced   Tobacco Use    Smoking status: Former Smoker     Packs/day: 1.00     Years: 15.00     Pack years: 15.00     Types: Cigarettes     Quit date: 01/27/2016     Years since quitting: 4.8    Smokeless tobacco: Never Used   Substance and Sexual Activity    Alcohol use: Yes     Alcohol/week: 2.0 standard drinks     Types: 2 Glasses of wine per week     Comment: 2 glasses of wine per week    Drug use: Never    Sexual activity: Yes     Partners: Male     Birth control/protection: Rhythm   Other Topics Concern    Military Service No    Blood Transfusions No    Caffeine Concern No     Occupational Exposure No    Hobby Hazards No    Sleep Concern No    Stress Concern No    Weight Concern No    Special Diet No    Back Care No    Exercises Regularly Yes    Bike Helmet Use Yes    Seat Belt Use Yes    Performs Self-Exams Yes   Social History Narrative    Moved from [place] is great, high fiber, she is very conscientious of effect of diet on health.    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    Hasn't had a cigarette in 8 months.    Has smoked 26 years, less than a pack a day.    Currently drinks 2-4 drinks a day on the weekends, total 4-8 per week.    Sexually active with one female partner.    Currently not working, has been on disability for most of the time from 1992 to now.        Allergies   Allergen Reactions    Penicillins Rash    Modafinil Diarrhea and Nausea and Vomiting     Verbally reviewed:      Most Recent Labs:  Results for orders placed or performed in visit on 12/02/20   Comprehensive Metabolic Panel (CMP)   Result Value Ref Range    Glucose 93 70 - 99 mg/dL    BUN 7 (L) 8 - 23 mg/dL    Creatinine 0.82 0.51 - 0.95 mg/dL    eGFR Based on CKD-EPI 2021 Equation >60 mL/min    GFR >60 mL/min    Sodium 137 136 - 145 mmol/L    Potassium 4.5 3.5 - 5.1 mmol/L    Chloride 101 98 - 107 mmol/L    Bicarbonate 27 22 - 29 mmol/L    Anion Gap 9 7 - 15 mmol/L    Calcium 9.9 8.5 - 10.6 mg/dL    Total Protein 7.1 6.0 - 8.0 g/dL    Albumin 4.8 3.5 - 5.2 g/dL  Bilirubin, Tot 0.34 <1.2 mg/dL    AST (SGOT) 37 (H) 0 - 32 U/L    ALT (SGPT) 24 0 - 33 U/L    Alkaline Phos 69 40 - 130 U/L   Lipid Panel Green Plasma Separator Tube   Result Value Ref Range    Cholesterol 283 (H) <200 mg/dL    HDL-Cholesterol 101 mg/dL    LDL-Chol (Calc) 159 <160 mg/dL    Non-HDL Cholesterol 182 mg/dL    Triglycerides 117 10 - 170 mg/dL   CRP HS, Blood Green Plasma Separator Tube   Result Value Ref Range    CRP HS 0.5 0.0 - 4.9 mg/L       + reassuring abdominal u/s  + CT chest  + CT abdomen/pelvis.     All ~  normal  IMPRESSION:  Unremarkable abdominal ultrasound.    IMPRESSION:    IMPRESSION:  CT scan of the abdomen with IV contrast    Grossly stable appearance of a right Bochdalek's hernia containing fat.    Small fat containing right Bochdalek's hernia which is unchanged compared to calcium score CT 09/23/2020.      ASSESSMENT AND PLAN  Kalynn Rodenbaugh is a 60 year old female female who was provided a virtual visit with telephone based delivery.    She has been having nausea/vomiting of unclear etiology, Results all look reassuring - labs and imaging. Do not think hernia is likely having anything to do with the N/V.   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 Pt to call/message/make appointment, etc if symptoms recur.       ICD-10-CM ICD-9-CM    1. Non-intractable vomiting with nausea, unspecified vomiting type  R11.2 787.01    2. Adequate exercise, at or above goal  Z78.9 V49.89    3. Screened negative for alcohol use  Z13.39 V79.1    4. Screened negative for drug use  Z13.89 V82.9          The plan was carefully reviewed verbally with the patient and also affirmed that the patient understood next steps and follow up plan.    Ned Clines Carlye Grippe, MD

## 2020-12-12 ENCOUNTER — Ambulatory Visit (INDEPENDENT_AMBULATORY_CARE_PROVIDER_SITE_OTHER): Payer: Medicare Other | Admitting: Ophthalmology

## 2020-12-12 ENCOUNTER — Ambulatory Visit
Admission: RE | Admit: 2020-12-12 | Discharge: 2020-12-12 | Disposition: A | Discharge status: Home / Self Care | Payer: Medicare Other | Attending: Internal Medicine | Admitting: Internal Medicine

## 2020-12-12 VITALS — BP 130/80 | HR 86 | Temp 97.4°F | Resp 18 | Wt 143.3 lb

## 2020-12-12 DIAGNOSIS — D8989 Other specified disorders involving the immune mechanism, not elsewhere classified: Secondary | ICD-10-CM | POA: Insufficient documentation

## 2020-12-12 DIAGNOSIS — R110 Nausea: Secondary | ICD-10-CM

## 2020-12-12 DIAGNOSIS — H209 Unspecified iridocyclitis: Secondary | ICD-10-CM

## 2020-12-12 DIAGNOSIS — G35 Multiple sclerosis: Secondary | ICD-10-CM | POA: Insufficient documentation

## 2020-12-12 LAB — CBC WITH DIFF, BLOOD
ANC-Automated: 3.5 10*3/uL (ref 1.6–7.0)
ANC-Instrument: 3.5 10*3/uL (ref 1.6–7.0)
Abs Basophils: 0 10*3/uL (ref <0.2)
Abs Eosinophils: 0.3 10*3/uL (ref 0.0–0.5)
Abs Lymphs: 1.7 10*3/uL (ref 0.8–3.1)
Abs Monos: 0.6 10*3/uL (ref 0.2–0.8)
Basophils: 1 %
Eosinophils: 5 %
Hct: 38.5 % (ref 34.0–45.0)
Hgb: 12.6 gm/dL (ref 11.2–15.7)
Lymphocytes: 28 %
MCH: 30.1 pg (ref 26.0–32.0)
MCHC: 32.7 g/dL (ref 32.0–36.0)
MCV: 91.9 um3 (ref 79.0–95.0)
MPV: 9 fL — ABNORMAL LOW (ref 9.4–12.4)
Monocytes: 10 %
Plt Count: 258 10*3/uL (ref 140–370)
RBC: 4.19 10*6/uL (ref 3.90–5.20)
RDW: 12.2 % (ref 12.0–14.0)
Segs: 58 %
WBC: 6 10*3/uL (ref 4.0–10.0)

## 2020-12-12 LAB — COMPREHENSIVE METABOLIC PANEL, BLOOD
ALT (SGPT): 18 U/L (ref 0–33)
AST (SGOT): 22 U/L (ref 0–32)
Albumin: 4.2 g/dL (ref 3.5–5.2)
Alkaline Phos: 64 U/L (ref 40–130)
Anion Gap: 11 mmol/L (ref 7–15)
BUN: 11 mg/dL (ref 8–23)
Bicarbonate: 25 mmol/L (ref 22–29)
Bilirubin, Tot: 0.17 mg/dL (ref <1.2)
Calcium: 9.4 mg/dL (ref 8.5–10.6)
Chloride: 105 mmol/L (ref 98–107)
Creatinine: 0.73 mg/dL (ref 0.51–0.95)
GFR: >60 mL/min
Glucose: 114 mg/dL — ABNORMAL HIGH (ref 70–99)
Potassium: 4.1 mmol/L (ref 3.5–5.1)
Sodium: 141 mmol/L (ref 136–145)
Total Protein: 6.9 g/dL (ref 6.0–8.0)
eGFR Based on CKD-EPI 2021 Equation: >60 mL/min

## 2020-12-12 LAB — SED RATE, BLOOD: Sed Rate: 6 mm/hr (ref 0–30)

## 2020-12-12 LAB — IGG, BLOOD: IGG: 867 mg/dL (ref 700–1600)

## 2020-12-12 LAB — C-REACTIVE PROTEIN, BLOOD: CRP: <0.3 mg/dL (ref <0.5)

## 2020-12-12 MED ORDER — SODIUM CHLORIDE 0.9 % IV SOLN
INTRAVENOUS | Status: DC
Start: 2020-12-12 — End: 2020-12-13

## 2020-12-12 MED ORDER — ACETAMINOPHEN 325 MG PO TABS
650.0000 mg | ORAL_TABLET | Freq: Once | ORAL | Status: AC
Start: 2020-12-12 — End: 2020-12-12
  Administered 2020-12-12 (×2): 650 mg via ORAL
  Filled 2020-12-12: qty 2

## 2020-12-12 MED ORDER — ONDANSETRON HCL 4 MG/2ML IV SOLN
8.0000 mg | Freq: Once | INTRAMUSCULAR | Status: AC | PRN
Start: 2020-12-12 — End: 2020-12-12
  Administered 2020-12-12 (×2): 8 mg via INTRAVENOUS
  Filled 2020-12-12: qty 4

## 2020-12-12 MED ORDER — METHYLPREDNISOLONE SODIUM SUCC 125 MG IJ SOLR CUSTOM
100.0000 mg | Freq: Once | INTRAMUSCULAR | Status: AC
Start: 2020-12-12 — End: 2020-12-12
  Administered 2020-12-12 (×2): 100 mg via INTRAVENOUS
  Filled 2020-12-12: qty 125

## 2020-12-12 MED ORDER — SODIUM CHLORIDE 0.9 % IV SOLN
1000.0000 mg | Freq: Once | INTRAVENOUS | Status: AC
Start: 2020-12-12 — End: 2020-12-12
  Administered 2020-12-12 (×2): 1000 mg via INTRAVENOUS
  Filled 2020-12-12: qty 100

## 2020-12-12 MED ORDER — ACETAMINOPHEN 325 MG PO TABS
325.0000 mg | ORAL_TABLET | Freq: Once | ORAL | Status: DC | PRN
Start: 2020-12-12 — End: 2020-12-16

## 2020-12-12 MED ORDER — DIPHENHYDRAMINE HCL 50 MG/ML IJ SOLN
25.0000 mg | Freq: Once | INTRAMUSCULAR | Status: AC
Start: 2020-12-12 — End: 2020-12-12
  Administered 2020-12-12 (×2): 25 mg via INTRAVENOUS
  Filled 2020-12-12: qty 1

## 2020-12-12 NOTE — Interdisciplinary (Signed)
Non-Chemotherapy Infusion Nursing Note - Debra Bolton is a 60 year old female who presents for infusion of Rituxan, lab draw via piv.    Vitals:    12/12/20 1100 12/12/20 1200 12/12/20 1230 12/12/20 1438   BP: 114/67 116/72 112/73 130/80   BP Location:       BP Patient Position:       Pulse: 68 76 79 86   Resp: '18 18 18 18   '$ Temp:    97.4 F (36.3 C)   SpO2:       Weight:         Pain Score: NA (pre med, non-pain or scheduled)  Body surface area is 1.71 meters squared.  Body mass index is 24.6 kg/m.    Pre-treatment nursing assessment:  Pt reported some neuropathy pain, she forgot to take her pain med last night.   Tylenol given as part of premeds  1200 pt reported pain relieved.  [name] tolerated treatment well.    Post blood return: Brisk  Post-Flush: NS  1308 pt c/o nausea, gave pt Zofran IV  1350 nausea resolved.  Medications   sodium chloride 0.9% infusion (0 mL IntraVENOUS Stopped 12/12/20 1440)   acetaminophen (TYLENOL) tablet 325 mg (has no administration in time range)   acetaminophen (TYLENOL) tablet 650 mg (650 mg Oral Given 12/12/20 1022)   diphenhydrAMINE (BENADRYL) injection 25 mg (25 mg IntraVENOUS Given 12/12/20 1025)   methylPREDNISolone sodium succinate (SOLU-MEDROL) injection 100 mg (100 mg IntraVENOUS Given 12/12/20 1022)   riTUXimab (RITUXAN) 1,000 mg in sodium chloride 0.9 % 1,000 mL infusion (0 mg IntraVENOUS Stopped 12/12/20 1435)   ondansetron (ZOFRAN) injection 8 mg (8 mg IntraVENOUS Given 12/12/20 1316)   pt declined to stay for 30 min of observation.    Patient Education  Learner: Patient  Barriers to learning: No Barriers  Readiness to learn: Acceptance  Method: Explanation    Treatment Education: Information/teaching given to patient including: signs and symptoms of infection, bleeding, adverse reaction(s), symptom control, and when to notify MD.    [name]: Instructed patient to call for assistance.    Pain Education: Patient  instructed to contact nurse if pain should develop or if their current pain therapy becomes ineffective.    Response: Verbalizes understanding    Discharge Plan  Discharge instructions given to patient.  Future appointments given and reviewed with treatment plan.  Discharge Mode: Ambulatory  Discharge Time: Long  Accompanied by: Self  Discharged To: Home

## 2020-12-13 ENCOUNTER — Other Ambulatory Visit (INDEPENDENT_AMBULATORY_CARE_PROVIDER_SITE_OTHER): Payer: Self-pay | Admitting: Psychiatric/Mental Health

## 2020-12-13 DIAGNOSIS — F339 Major depressive disorder, recurrent, unspecified: Secondary | ICD-10-CM

## 2020-12-14 LAB — IGG SUBCLASS PANEL, BLOOD
IGG Subclass 1: 382 mg/dL (ref 240–1118)
IGG Subclass 2: 242 mg/dL (ref 124–549)
IGG Subclass 3: 8 mg/dL — ABNORMAL LOW (ref 21–134)
IGG Subclass 4: 118 mg/dL (ref 1–123)

## 2020-12-15 ENCOUNTER — Ambulatory Visit (INDEPENDENT_AMBULATORY_CARE_PROVIDER_SITE_OTHER): Payer: Medicare HMO | Admitting: Legal Medicine

## 2020-12-15 ENCOUNTER — Other Ambulatory Visit: Payer: Self-pay

## 2020-12-15 ENCOUNTER — Encounter: Payer: Self-pay | Admitting: Legal Medicine

## 2020-12-15 VITALS — BP 142/70 | HR 60 | Temp 97.5°F | Ht 63.0 in | Wt 96.6 lb

## 2020-12-15 DIAGNOSIS — F321 Major depressive disorder, single episode, moderate: Secondary | ICD-10-CM | POA: Diagnosis not present

## 2020-12-15 DIAGNOSIS — I10 Essential (primary) hypertension: Secondary | ICD-10-CM

## 2020-12-15 DIAGNOSIS — E782 Mixed hyperlipidemia: Secondary | ICD-10-CM

## 2020-12-15 DIAGNOSIS — Z681 Body mass index (BMI) 19 or less, adult: Secondary | ICD-10-CM | POA: Diagnosis not present

## 2020-12-15 DIAGNOSIS — I2510 Atherosclerotic heart disease of native coronary artery without angina pectoris: Secondary | ICD-10-CM | POA: Diagnosis not present

## 2020-12-15 DIAGNOSIS — E440 Moderate protein-calorie malnutrition: Secondary | ICD-10-CM | POA: Diagnosis not present

## 2020-12-15 DIAGNOSIS — F17200 Nicotine dependence, unspecified, uncomplicated: Secondary | ICD-10-CM

## 2020-12-15 DIAGNOSIS — J449 Chronic obstructive pulmonary disease, unspecified: Secondary | ICD-10-CM

## 2020-12-15 MED ORDER — NICOTROL NS 10 MG/ML NA SOLN: 1.0000 mg | NASAL | 3 refills | Status: DC | PRN

## 2020-12-15 MED ORDER — NICOTROL NS 10 MG/ML NA SOLN: NASAL | 3 refills | Status: DC

## 2020-12-15 NOTE — Telephone Encounter (Signed)
Patient contacted the office to request a medication refill on the following:    buPROPion (WELLBUTRIN) 300 MG Extended-Release tablet  - Sig: TAKE 1 TABLET BY MOUTH EVERY MORNING    Pharmacy:   [id number] [place] [id number]  Phone:  [phone]:  [phone]     Last appt: 05/13/20 with NP JCreig Hines Next appt: 0      - GEN PSYCH PATIENT:       *If patient calls back, [facility]- please follow the instructions below:      [facility] (Telehealth is available).     628m TOC slot in-person or as video.   [place]:   -Dr. ShEartha InchTues-Th)  -Dr. AlLamar SprinklesTues)  -Dr. XiJules SchickMon, Th, Fri)  -Dr. AnDairl PonderMon-Th)  -Dr. KaLanell PersonsMon-Fri)  -[facility]   *NOTE: LaPrudence Davidsonroviders are booked ~2-3 months out.     304mfollow up slot as in person or as video.   [place]:  -NP Crystal Ross (Tues)  -Dr. SteSuan Haltered)  -Dr. JesClayburn Perted)  -[facility]. LawFleet [facility]-Dr. LamCato Mulliganed)   -Dr. GreRaynaldo OpitzMon-Fri)  *NOTE: [place] has sooner appointments than [facility]   [facility] - residents.   Options: AstoLeonie ManamOlivia CanterrkDerenda FennelmpMustang RidgexoRiobrBarbourvillegdCarson[place], [place]ba [place]  Add in Appt notes: OPSLJ transfer  Add a Permanent  Note - *Case Closed at [facility] to patient: [phone]  [address] [place] [id number]  Thank you,    KendEduardo OsierA
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
## 2020-12-15 NOTE — Telephone Encounter (Signed)
Call in nicotrol ns lp

## 2020-12-16 ENCOUNTER — Other Ambulatory Visit: Payer: Self-pay | Admitting: Legal Medicine

## 2020-12-16 LAB — COMPREHENSIVE METABOLIC PANEL
ALT: 15 IU/L (ref 0–32)
AST: 19 IU/L (ref 0–40)
Albumin/Globulin Ratio: 1.6 (ref 1.2–2.2)
Albumin: 4.4 g/dL (ref 3.8–4.8)
Alkaline Phosphatase: 74 IU/L (ref 44–121)
BUN/Creatinine Ratio: 25 (ref 12–28)
BUN: 17 mg/dL (ref 8–27)
Bilirubin Total: 0.4 mg/dL (ref 0.0–1.2)
CO2: 25 mmol/L (ref 20–29)
Calcium: 9.5 mg/dL (ref 8.7–10.3)
Chloride: 102 mmol/L (ref 96–106)
Creatinine, Ser: 0.67 mg/dL (ref 0.57–1.00)
Globulin, Total: 2.7 g/dL (ref 1.5–4.5)
Glucose: 84 mg/dL (ref 65–99)
Potassium: 4.4 mmol/L (ref 3.5–5.2)
Sodium: 137 mmol/L (ref 134–144)
Total Protein: 7.1 g/dL (ref 6.0–8.5)
eGFR: 99 mL/min/{1.73_m2} (ref >59)

## 2020-12-16 LAB — CBC WITH DIFFERENTIAL/PLATELET
Basophils Absolute: 0 10*3/uL (ref 0.0–0.2)
Basos: 0 %
EOS (ABSOLUTE): 0.1 10*3/uL (ref 0.0–0.4)
Eos: 1 %
Hematocrit: 42.6 % (ref 34.0–46.6)
Hemoglobin: 14.4 g/dL (ref 11.1–15.9)
Immature Grans (Abs): 0 10*3/uL (ref 0.0–0.1)
Immature Granulocytes: 0 %
Lymphocytes Absolute: 1.8 10*3/uL (ref 0.7–3.1)
Lymphs: 27 %
MCH: 32.1 pg (ref 26.6–33.0)
MCHC: 33.8 g/dL (ref 31.5–35.7)
MCV: 95 fL (ref 79–97)
Monocytes Absolute: 0.5 10*3/uL (ref 0.1–0.9)
Monocytes: 7 %
Neutrophils Absolute: 4.3 10*3/uL (ref 1.4–7.0)
Neutrophils: 65 %
Platelets: 207 10*3/uL (ref 150–450)
RBC: 4.49 x10E6/uL (ref 3.77–5.28)
RDW: 12.4 % (ref 11.7–15.4)
WBC: 6.7 10*3/uL (ref 3.4–10.8)

## 2020-12-16 LAB — LIPID PANEL
Chol/HDL Ratio: 2.7 ratio (ref 0.0–4.4)
Cholesterol, Total: 139 mg/dL (ref 100–199)
HDL: 52 mg/dL (ref >39)
LDL Chol Calc (NIH): 56 mg/dL (ref 0–99)
Triglycerides: 193 mg/dL — ABNORMAL HIGH (ref 0–149)
VLDL Cholesterol Cal: 31 mg/dL (ref 5–40)

## 2020-12-16 LAB — CARDIOVASCULAR RISK ASSESSMENT

## 2020-12-16 NOTE — Chronic Care Management (AMB) (Signed)
Ms. Dorothy Parker returned my call this morning and she is doing well. It is [birth year] today and she has no complaints at this time. I informed her of CPP's recommendation to adhere to the use of her Stiolto for preventative measure. She understands the importance and agreed to this plan. She no further questions or concerns.  Purvis Kilts CPA, CMA

## 2020-12-16 NOTE — Progress Notes (Signed)
Kidney and liver tests normal, CBC normal, triglycerides 193,  lp

## 2020-12-18 ENCOUNTER — Encounter: Payer: Self-pay | Admitting: Legal Medicine

## 2020-12-18 ENCOUNTER — Other Ambulatory Visit: Payer: Self-pay

## 2020-12-18 ENCOUNTER — Ambulatory Visit (INDEPENDENT_AMBULATORY_CARE_PROVIDER_SITE_OTHER): Payer: Medicare HMO

## 2020-12-18 VITALS — BP 132/68 | HR 88 | Resp 16 | Ht 63.0 in | Wt 98.0 lb

## 2020-12-18 DIAGNOSIS — Z0000 Encounter for general adult medical examination without abnormal findings: Secondary | ICD-10-CM | POA: Diagnosis not present

## 2020-12-18 DIAGNOSIS — Z5320 Procedure and treatment not carried out because of patient's decision for unspecified reasons: Secondary | ICD-10-CM

## 2020-12-18 DIAGNOSIS — Z28310 Unvaccinated for covid-19: Secondary | ICD-10-CM | POA: Diagnosis not present

## 2020-12-18 DIAGNOSIS — F17219 Nicotine dependence, cigarettes, with unspecified nicotine-induced disorders: Secondary | ICD-10-CM | POA: Diagnosis not present

## 2020-12-18 DIAGNOSIS — Z122 Encounter for screening for malignant neoplasm of respiratory organs: Secondary | ICD-10-CM

## 2020-12-18 DIAGNOSIS — Z1231 Encounter for screening mammogram for malignant neoplasm of breast: Secondary | ICD-10-CM

## 2020-12-18 DIAGNOSIS — M810 Age-related osteoporosis without current pathological fracture: Secondary | ICD-10-CM | POA: Diagnosis not present
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
## 2020-12-18 NOTE — Patient Instructions (Signed)

## 2020-12-22 NOTE — Progress Notes (Signed)
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    Posterior polar cataract OD: congenital?    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 Mildly active today pending repeat Rituxan q 6 months, has been 6 months since last infusion, rec restart PF daily or bid for now pending Rituxan. Improved VA OD, patient happy with improvement s/p CE/IOL OD 09/2020. High risk of vision loss from uncontrolled uveitis, agree with systemic treatment. Uveitis precautions emphasized. Recommendations discussed with outside providers.    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 OCT mac w/o edema ou today.    Orders:  -     OCT, Retina - OU - Both Eyes -  -     Fundus Photos - OU - Both Eyes -  -     prednisoLONE acetate (PRED FORTE) 1 % ophthalmic suspension    Nuclear sclerotic cataract of right eye

## 2020-12-23 DIAGNOSIS — M47816 Spondylosis without myelopathy or radiculopathy, lumbar region: Secondary | ICD-10-CM | POA: Diagnosis not present

## 2020-12-23 DIAGNOSIS — G894 Chronic pain syndrome: Secondary | ICD-10-CM | POA: Diagnosis not present

## 2020-12-23 DIAGNOSIS — M461 Sacroiliitis, not elsewhere classified: Secondary | ICD-10-CM | POA: Diagnosis not present

## 2020-12-23 DIAGNOSIS — M48061 Spinal stenosis, lumbar region without neurogenic claudication: Secondary | ICD-10-CM | POA: Diagnosis not present

## 2020-12-23 DIAGNOSIS — M5136 Other intervertebral disc degeneration, lumbar region: Secondary | ICD-10-CM | POA: Diagnosis not present

## 2020-12-23 DIAGNOSIS — M546 Pain in thoracic spine: Secondary | ICD-10-CM | POA: Diagnosis not present

## 2020-12-23 DIAGNOSIS — M5090 Cervical disc disorder, unspecified, unspecified cervical region: Secondary | ICD-10-CM | POA: Diagnosis not present

## 2020-12-25 ENCOUNTER — Telehealth: Payer: Self-pay | Admitting: Legal Medicine

## 2020-12-25 NOTE — Telephone Encounter (Signed)
   Dorothy Parker has been scheduled for the following appointment:  WHAT: MAMMOGRAM & CT LUNG SCREEN WHERE: [facility] DATE: 01/08/21 TIME: 3:30 PM ARRIVAL TIME  Patient has been made aware.

## 2020-12-26 ENCOUNTER — Ambulatory Visit (HOSPITAL_BASED_OUTPATIENT_CLINIC_OR_DEPARTMENT_OTHER): Payer: Medicare Other

## 2020-12-29 DIAGNOSIS — M47816 Spondylosis without myelopathy or radiculopathy, lumbar region: Secondary | ICD-10-CM | POA: Diagnosis not present

## 2020-12-29 DIAGNOSIS — Z1389 Encounter for screening for other disorder: Secondary | ICD-10-CM | POA: Diagnosis not present

## 2020-12-29 DIAGNOSIS — M5136 Other intervertebral disc degeneration, lumbar region: Secondary | ICD-10-CM | POA: Diagnosis not present

## 2020-12-29 DIAGNOSIS — Z79891 Long term (current) use of opiate analgesic: Secondary | ICD-10-CM | POA: Diagnosis not present

## 2020-12-29 DIAGNOSIS — G894 Chronic pain syndrome: Secondary | ICD-10-CM | POA: Diagnosis not present

## 2020-12-29 DIAGNOSIS — M461 Sacroiliitis, not elsewhere classified: Secondary | ICD-10-CM | POA: Diagnosis not present

## 2020-12-29 DIAGNOSIS — M48061 Spinal stenosis, lumbar region without neurogenic claudication: Secondary | ICD-10-CM | POA: Diagnosis not present

## 2020-12-30 ENCOUNTER — Encounter: Payer: Self-pay | Admitting: Legal Medicine

## 2020-12-30 ENCOUNTER — Other Ambulatory Visit: Payer: Self-pay

## 2020-12-30 ENCOUNTER — Ambulatory Visit (INDEPENDENT_AMBULATORY_CARE_PROVIDER_SITE_OTHER): Payer: Medicare HMO | Admitting: Legal Medicine

## 2020-12-30 VITALS — BP 124/86 | HR 68 | Temp 97.8°F | Ht 63.0 in | Wt 98.0 lb

## 2020-12-30 DIAGNOSIS — F17200 Nicotine dependence, unspecified, uncomplicated: Secondary | ICD-10-CM | POA: Diagnosis not present
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
## 2021-01-02 DIAGNOSIS — M5090 Cervical disc disorder, unspecified, unspecified cervical region: Secondary | ICD-10-CM | POA: Diagnosis not present

## 2021-01-02 DIAGNOSIS — M461 Sacroiliitis, not elsewhere classified: Secondary | ICD-10-CM | POA: Diagnosis not present

## 2021-01-02 DIAGNOSIS — M5136 Other intervertebral disc degeneration, lumbar region: Secondary | ICD-10-CM | POA: Diagnosis not present

## 2021-01-02 DIAGNOSIS — M48061 Spinal stenosis, lumbar region without neurogenic claudication: Secondary | ICD-10-CM | POA: Diagnosis not present

## 2021-01-02 DIAGNOSIS — G894 Chronic pain syndrome: Secondary | ICD-10-CM | POA: Diagnosis not present

## 2021-01-02 DIAGNOSIS — M47816 Spondylosis without myelopathy or radiculopathy, lumbar region: Secondary | ICD-10-CM | POA: Diagnosis not present

## 2021-01-02 DIAGNOSIS — M546 Pain in thoracic spine: Secondary | ICD-10-CM | POA: Diagnosis not present

## 2021-01-05 ENCOUNTER — Encounter (INDEPENDENT_AMBULATORY_CARE_PROVIDER_SITE_OTHER): Payer: Self-pay | Admitting: Family Practice

## 2021-01-05 ENCOUNTER — Other Ambulatory Visit (INDEPENDENT_AMBULATORY_CARE_PROVIDER_SITE_OTHER): Payer: Self-pay | Admitting: Psychiatric/Mental Health

## 2021-01-05 ENCOUNTER — Encounter (INDEPENDENT_AMBULATORY_CARE_PROVIDER_SITE_OTHER): Payer: Self-pay | Admitting: Clinical

## 2021-01-05 DIAGNOSIS — F515 Nightmare disorder: Secondary | ICD-10-CM

## 2021-01-05 DIAGNOSIS — F339 Major depressive disorder, recurrent, unspecified: Secondary | ICD-10-CM

## 2021-01-05 NOTE — Telephone Encounter (Signed)
Spoke to pt over the phone and informed her that NP [facility] sent 90 day supply of  buPROPion (WELLBUTRIN) 300 MG Extended-Release tablet on 10/09/20 with one additional refill. Pt stated that she will request for the pharmacy to transfer pharmacy in [place]. Pt is actually with daughter to help care for her new grandchild.  Pt stated she will not back in [place] until November.     Macey C. LVN

## 2021-01-08 DIAGNOSIS — Z87891 Personal history of nicotine dependence: Secondary | ICD-10-CM | POA: Diagnosis not present

## 2021-01-08 DIAGNOSIS — F17210 Nicotine dependence, cigarettes, uncomplicated: Secondary | ICD-10-CM | POA: Diagnosis not present

## 2021-01-08 DIAGNOSIS — Z1231 Encounter for screening mammogram for malignant neoplasm of breast: Secondary | ICD-10-CM | POA: Diagnosis not present

## 2021-01-15 ENCOUNTER — Telehealth: Payer: Self-pay

## 2021-01-15 NOTE — Chronic Care Management (AMB) (Signed)
    Chronic Care Management Pharmacy Assistant   Name: Dorothy Parker  MRN: [id number] DOB: 1960/09/11   Reason for Encounter: General Adherence Call    Recent office visits:  12/30/20 Dorothy Meeker MD. Seen for tobacco disorder. Changed the Lidocaine Patch from 5% to 1.8%. Follow up in 3 months.  12/18/20 Dorothy Hammock LPN. Seen for Screening for lung cancer and AWV. Ordered CT scan and Mammogram. No med changes. Follow up in 1 year.  12/15/20 Dorothy Meeker MD. Seen for HL, HTN. Started Nicotine 1 mg nasal as needed. Follow up in 2 weeks.  Recent consult visits:  None since 12/01/20  Hospital visits:  None since 12/01/20  Medications: Outpatient Encounter Medications as of 01/15/2021  Medication Sig   alendronate (FOSAMAX) 70 MG tablet Take 1 tablet (70 mg total) by mouth every 7 (seven) days. Take with a full glass of water on an empty stomach.   ascorbic acid (VITAMIN C) 500 MG tablet Take by mouth.   aspirin 81 MG EC tablet    glycopyrrolate (ROBINUL) 1 MG tablet TAKE TWO TABLETS BY MOUTH TWICE DAILY   metoprolol tartrate (LOPRESSOR) 25 MG tablet TAKE 1 TABLET BY MOUTH TWICE A DAY   naloxone (NARCAN) 4 MG/0.1ML LIQD nasal spray kit USE 1 SPRAY IN ONE NOSTRIL. MAY REPEAT IN OTHER NOSTRIL AFTER 2-3 MINS IF NEEDED   Nicotine (NICOTROL NS) 10 MG/ML SOLN 2 puff into each nostril as needed up to 5 times per hour. Maximum 40 doses per day.   nitroGLYCERIN (NITROLINGUAL) 0.4 MG/SPRAY spray Place under the tongue.   oxyCODONE-acetaminophen (PERCOCET) 7.5-325 MG tablet Take 1 tablet by mouth 4 (four) times daily as needed.   quinapril (ACCUPRIL) 10 MG tablet TAKE 1 TABLET BY MOUTH DAILY   rosuvastatin (CRESTOR) 5 MG tablet TAKE 1 TABLET BY MOUTH DAILY.   STIOLTO RESPIMAT 2.5-2.5 MCG/ACT AERS INHALE 1 PUFF INTO THE LUNGS IN THE MORNING AND AT BEDTIME   triamcinolone cream (KENALOG) 0.1 % APPLY A THIN LAYER TO THE AFFECTED AREA(S) TWICE A DAY.   ZTLIDO 1.8 % PTCH    No  facility-administered encounter medications on file as of 01/15/2021.    Contacted Dorothy Parker for general disease state and medication adherence call.   Patient is > 5 days past due for refill on the following medications per chart history:  [facility] Medications: Medication Name/mg Last Fill Days Supply Rosuvastatin 5 mg  10/08/20 90ds   What concerns do you have about your medications? Pt states no issues  The patient denies side effects with her medications.   How often do you forget or accidentally miss a dose? Never  Do you use a pillbox? No  Are you having any problems getting your medications from your pharmacy? Pt not having any issues  Has the cost of your medications been a concern? No concerns   Since last visit with CPP, no interventions have been made:   The patient has not had an ED visit since last contact.   The patient denies problems with their health. Pt stated everything is going well    Patient states BP readings are as follows: Pt only checks her BP infrequently and last reading was a week ago and it was 140/60s.   Care Gaps: Last annual wellness visit? Done 11/20/23 If applicable: N/A Last eye exam / retinopathy screening? Diabetic foot exam?    Scott

## 2021-01-20 ENCOUNTER — Other Ambulatory Visit (HOSPITAL_BASED_OUTPATIENT_CLINIC_OR_DEPARTMENT_OTHER): Payer: Self-pay | Admitting: Neurology

## 2021-01-20 DIAGNOSIS — M797 Fibromyalgia: Secondary | ICD-10-CM

## 2021-01-20 NOTE — Telephone Encounter (Signed)
Requesting medication t be phoned in.  abapentin (NEURONTIN) 300 MG capsule

## 2021-01-21 MED ORDER — GABAPENTIN 300 MG OR CAPS
600.0000 mg | ORAL_CAPSULE | Freq: Every evening | ORAL | 5 refills | Status: DC
Start: 2021-01-21 — End: 2021-04-29

## 2021-01-21 MED ORDER — GABAPENTIN 300 MG OR CAPS
600.0000 mg | ORAL_CAPSULE | Freq: Every evening | ORAL | 5 refills | Status: DC
Start: 2021-01-21 — End: 2021-01-21

## 2021-01-21 NOTE — Telephone Encounter (Signed)
[facility] lookup with phone number provided is:     [place] [address]  Store [phone]    Rx pended with above pharmacy for provider signature and review since this is a controlled drug.      Erik Obey Armiyah Capron, PHARMD, BCPS

## 2021-01-22 DIAGNOSIS — M48061 Spinal stenosis, lumbar region without neurogenic claudication: Secondary | ICD-10-CM | POA: Diagnosis not present

## 2021-01-22 DIAGNOSIS — M546 Pain in thoracic spine: Secondary | ICD-10-CM | POA: Diagnosis not present

## 2021-01-22 DIAGNOSIS — M461 Sacroiliitis, not elsewhere classified: Secondary | ICD-10-CM | POA: Diagnosis not present

## 2021-01-22 DIAGNOSIS — M5136 Other intervertebral disc degeneration, lumbar region: Secondary | ICD-10-CM | POA: Diagnosis not present

## 2021-01-22 DIAGNOSIS — G894 Chronic pain syndrome: Secondary | ICD-10-CM | POA: Diagnosis not present

## 2021-01-22 DIAGNOSIS — M5090 Cervical disc disorder, unspecified, unspecified cervical region: Secondary | ICD-10-CM | POA: Diagnosis not present

## 2021-01-22 DIAGNOSIS — M47816 Spondylosis without myelopathy or radiculopathy, lumbar region: Secondary | ICD-10-CM | POA: Diagnosis not present

## 2021-01-26 DIAGNOSIS — M48061 Spinal stenosis, lumbar region without neurogenic claudication: Secondary | ICD-10-CM | POA: Diagnosis not present

## 2021-01-26 DIAGNOSIS — Z79891 Long term (current) use of opiate analgesic: Secondary | ICD-10-CM | POA: Diagnosis not present

## 2021-01-26 DIAGNOSIS — M47816 Spondylosis without myelopathy or radiculopathy, lumbar region: Secondary | ICD-10-CM | POA: Diagnosis not present

## 2021-01-26 DIAGNOSIS — M461 Sacroiliitis, not elsewhere classified: Secondary | ICD-10-CM | POA: Diagnosis not present

## 2021-01-26 DIAGNOSIS — Z1389 Encounter for screening for other disorder: Secondary | ICD-10-CM | POA: Diagnosis not present

## 2021-01-26 DIAGNOSIS — G894 Chronic pain syndrome: Secondary | ICD-10-CM | POA: Diagnosis not present

## 2021-01-26 DIAGNOSIS — M5136 Other intervertebral disc degeneration, lumbar region: Secondary | ICD-10-CM | POA: Diagnosis not present

## 2021-02-01 DIAGNOSIS — M461 Sacroiliitis, not elsewhere classified: Secondary | ICD-10-CM | POA: Diagnosis not present

## 2021-02-01 DIAGNOSIS — M5136 Other intervertebral disc degeneration, lumbar region: Secondary | ICD-10-CM | POA: Diagnosis not present

## 2021-02-01 DIAGNOSIS — M48061 Spinal stenosis, lumbar region without neurogenic claudication: Secondary | ICD-10-CM | POA: Diagnosis not present

## 2021-02-01 DIAGNOSIS — M546 Pain in thoracic spine: Secondary | ICD-10-CM | POA: Diagnosis not present

## 2021-02-01 DIAGNOSIS — M47816 Spondylosis without myelopathy or radiculopathy, lumbar region: Secondary | ICD-10-CM | POA: Diagnosis not present

## 2021-02-01 DIAGNOSIS — M5090 Cervical disc disorder, unspecified, unspecified cervical region: Secondary | ICD-10-CM | POA: Diagnosis not present

## 2021-02-01 DIAGNOSIS — G894 Chronic pain syndrome: Secondary | ICD-10-CM | POA: Diagnosis not present

## 2021-02-03 ENCOUNTER — Other Ambulatory Visit: Payer: Self-pay

## 2021-02-03 ENCOUNTER — Ambulatory Visit (INDEPENDENT_AMBULATORY_CARE_PROVIDER_SITE_OTHER): Payer: Medicare HMO

## 2021-02-03 DIAGNOSIS — E782 Mixed hyperlipidemia: Secondary | ICD-10-CM

## 2021-02-03 DIAGNOSIS — F17200 Nicotine dependence, unspecified, uncomplicated: Secondary | ICD-10-CM

## 2021-02-03 DIAGNOSIS — I10 Essential (primary) hypertension: Secondary | ICD-10-CM

## 2021-02-03 NOTE — Patient Instructions (Signed)
Visit Information   Goals Addressed   None    Patient Care Plan: CCM Pharmacy Care Plan     Problem Identified: cad, htn, hld, copd   Priority: High  Onset Date: 08/05/2020     Long-Range Goal: Disease Management   Start Date: 08/05/2020  Expected End Date: 08/05/2021  Recent Progress: On track  Priority: High  Note:   Current Barriers:  Patient is interested in support for smoking cessation but has tried and failed various products in the past   Pharmacist Clinical Goal(s):  Patient will achieve control of smoking cessation as evidenced by continued use and recommended treatment through collaboration with PharmD and provider.   Interventions: 1:1 collaboration with Abigail Miyamoto, MD regarding development and update of comprehensive plan of care as evidenced by provider attestation and co-signature Inter-disciplinary care team collaboration (see longitudinal plan of care) Comprehensive medication review performed; medication list updated in electronic medical record  Hypertension (BP goal <130/80) -Not ideally controlled -Current treatment: metoprolol tartrate 25 mg bid  Quinapril 10 mg daily  -Medications previously tried: none reported -Current home readings: 120-140s/60-80s mmHg -Current dietary habits: Lives alone and doesn't cook for herself a lot. Fish and shrimp yesterday. Filet mignon in airfryer on Sunday. Toast streudel, cereal or bacon and eggs for breakfast. Likes fruits and vegetables but hard to use before they go bad.  -Current exercise habits: Uses above ground pool at her home during warmer weather. Back pain limits exercise.  -Denies hypotensive/hypertensive symptoms -Educated on BP goals and benefits of medications for prevention of heart attack, stroke and kidney damage; Daily salt intake goal < 2300 mg; Exercise goal of 150 minutes per week; Importance of home blood pressure monitoring; -Counseled to monitor BP at home occasionally, document,  and provide log at future appointments -Counseled on diet and exercise extensively Collaborated with nurse to have updated prescription for quinapril once daily to pharmacy. Patient is not checking bp regularly but has occasional reading above goal. Patient continues to smoke and discussed smoking cessation.   Hyperlipidemia/CAD: (LDL goal < 55) -Controlled -Current treatment: aspirin 81 mg ec  crestor 5 mg daily  Nitroglycerin 0.4 mg/spray prn -Medications previously tried: none reported  -Current dietary patterns: Drinks 2% milk daily. Rarely eats cheese.  -Current exercise habits: limited due to back pain.  -Educated on Cholesterol goals;  Benefits of statin for ASCVD risk reduction; Importance of limiting foods high in cholesterol; Exercise goal of 150 minutes per week; -Counseled on diet and exercise extensively Recommended to continue current medication  COPD (Goal: control symptoms and prevent exacerbations) -Controlled -Current treatment  Stiolto 1 puff into the lungs in am and hs -Medications previously tried: none reported  -Gold Grade: Gold 2 (FEV1 50-79%) -Pulmonary function testing: 08/16/2019 - 61% -Exacerbations requiring treatment in last 6 months: none reported -Patient reports consistent use of maintenance inhaler -Frequency of rescue inhaler use: none per patient  -Counseled on Benefits of consistent maintenance inhaler use -Recommended to continue current medication  Osteoporosis / Osteopenia (Goal prevent fractures) -Not ideally controlled -Last DEXA Scan: 07/2020   T-Score femoral neck: -3.2  T-Score lumbar spine: -3.5 -Patient is a candidate for pharmacologic treatment due to T-Score < -2.5 in lumbar spine -Current treatment  Alendronate 70 mg weekly  Calcium with D supplementation recommended by Dr. Marina Goodell and pharmacist  -Medications previously tried: n/a  -Recommend [phone] units of vitamin D daily. Recommend 1200 mg of calcium daily from dietary  and supplemental sources. Recommend weight-bearing and muscle  strengthening exercises for building and maintaining bone density. -Counseled on diet and exercise extensively Educated on calcium and vitamin d recommendations. Patient does drink boost but has very little intake of food due to appetite.   Tobacco use (Goal smoking cessation ) -Uncontrolled - smoking 1 packs per day (October 2022), 2 PPD (September 2022) -Previous quit attempts: Chantix - allergic reaction, patches - didn't work, wellbutrin, gum - dentures may prohibit chewing and parking -Current treatment  None reported -Patient smokes Within 30 minutes of waking -Patient triggers include: watching television, driving and finishing a meal -On a scale of 1-10, reports MOTIVATION to quit is 6 -On a scale of 1-10, reports CONFIDENCE in quitting is 6 -Provided contact information for  Quit Line (1-800-QUIT-NOW) and encouraged patient to reach out to this group for support. August 2022: Counseled on benefits of smoking cessation. Discussed options of smoking cessation and patient is interested in inhaler. Insurance will not approve inhaler until nasal spray has been tried. Will discuss with patient and then Dr. Marina Goodell if patient is ready to proceed.  October 2022: Nicotine nasal spray is causing ADR's (Congestion and sore throat), will ask PCP for Inhaled Nicotine. Patient smoking 1ppd (Down from 2PPD). Goal set for 1/2PPD by Nov 16th  Pain (Goal: manage symptoms of pain with specialist) -Controlled -Current treatment  Oxycodone-acetaminophen 7.5-325 mg QID  Naloxone 4 mg/0.1 ml nasal spray 1 spray in one nostril may repeat in other nostril after 2-3 minutes if needed -Medications previously tried: none reported   -Recommended to continue current medication  Sweating (Goal: minimize sweating) -Controlled -Current treatment  glycopyrrolate 1 mg 2 tablets twice daily  -Medications previously tried: none reported  -Recommended to  continue current medication  Health Maintenance -Vaccine gaps: COVID - not interested, Shingrix - hasn't had , TDAP - patient not interested at this time -Current therapy:  vitamin c 500 mg daily -Educated on Cost vs benefit of each product must be carefully weighed by individual consumer -Patient is satisfied with current therapy and denies issues -Recommended to continue current medication    Patient Goals/Self-Care Activities Patient will:  - take medications as prescribed focus on medication adherence by considering a pill box.  check blood pressure weekly, document, and provide at future appointments engage in dietary modifications by limiting salt and focusing on heart-healthy diet.   Follow Up Plan: Telephone follow up appointment with care management team member scheduled for: Pharmacist 02/2021 to follow-up on smoking cessation       The patient verbalized understanding of instructions, educational materials, and care plan provided today and declined offer to receive copy of patient instructions, educational materials, and care plan.  The pharmacy team will reach out to the patient again over the next 90 days.   Dorothy Parker, [facility]

## 2021-02-03 NOTE — Progress Notes (Signed)
Chronic Care Management Pharmacy Note  02/03/2021 Name:  Dorothy Parker MRN:  [id number] DOB:  28-Sep-1960  Subjective: Dorothy Parker is an 60 y.o. year old female who is a primary patient of Henrene Pastor, Zeb Comfort, MD.  The CCM team was consulted for assistance with disease management and care coordination needs.    Plan Updates:   If patient has tried and failed nicotine nasal spray (She has, caused sore throat and congestion, couldn't sing at church since starting medication), Nicotine inhaler prior authorization can be completed. Could you please send a new script of this to her pharmacy and we can get it approved ASAP, thanks!   Engaged with patient by telephone for follow up visit in response to provider referral for pharmacy case management and/or care coordination services.   Consent to Services:  The patient was given the following information about Chronic Care Management services today, agreed to services, and gave verbal consent: 1. CCM service includes personalized support from designated clinical staff supervised by the primary care provider, including individualized plan of care and coordination with other care providers 2. 24/7 contact phone numbers for assistance for urgent and routine care needs. 3. Service will only be billed when office clinical staff spend 20 minutes or more in a month to coordinate care. 4. Only one practitioner may furnish and bill the service in a calendar month. 5.The patient may stop CCM services at any time (effective at the end of the month) by phone call to the office staff. 6. The patient will be responsible for cost sharing (co-pay) of up to 20% of the service fee (after annual deductible is met). Patient agreed to services and consent obtained.  Patient Care Team: Lillard Anes, MD as PCP - General (Family Medicine) Flossie Buffy., MD as Referring Physician (Cardiology) Dorthula Rue, MD as Referring Physician  (Vascular Surgery) Duanne Limerick, OD as Consulting Physician (Ophthalmology) Burnice Logan, [facility] (Inactive) as Pharmacist (Pharmacist)  Recent office visits:  07/30/20- order placed for bone density   06/16/20. Dr. Henrene Pastor PCP, hyperlipidemia, follow up 51mo, no medication changes, CBC normal, kidney and liver tests normal, Cholesterol normal,  CCM referral, follow up 634mo   Recent consult visits:  11/06/2020 - Vascular -  continue antiplatelet and statin. Repeat carotid duplex in 1 year if no symptoms.   08/18/2020 - cardiology - recommend smoking cessation. Recommend continuing to follow-up with vascular/doppler scan.   Hospital visits:  None in previous 6 months  Objective:  Lab Results  Component Value Date   CREATININE 0.67 12/15/2020   BUN 17 12/15/2020   GFRNONAA 97 12/17/2019   GFRAA 112 12/17/2019   NA 137 12/15/2020   K 4.4 12/15/2020   CALCIUM 9.5 12/15/2020   CO2 25 12/15/2020   GLUCOSE 84 12/15/2020    No results found for: HGBA1C, FRUCTOSAMINE, GFR, MICROALBUR  Last diabetic Eye exam: No results found for: HMDIABEYEEXA  Last diabetic Foot exam: No results found for: HMDIABFOOTEX   Lab Results  Component Value Date   CHOL 139 12/15/2020   HDL 52 12/15/2020   LDLCALC 56 12/15/2020   TRIG 193 (H) 12/15/2020   CHOLHDL 2.7 12/15/2020    Hepatic Function Latest Ref Rng & Units 12/15/2020 06/16/2020 12/17/2019  Total Protein 6.0 - 8.5 g/dL 7.1 7.0 6.5  Albumin 3.8 - 4.8 g/dL 4.4 4.6 4.4  AST 0 - 40 IU/L 19 24 15   ALT 0 - 32 IU/L 15 19 9   Alk Phosphatase  44 - 121 IU/L 74 94 97  Total Bilirubin 0.0 - 1.2 mg/dL 0.4 0.4 0.4    Lab Results  Component Value Date/Time   TSH 0.688 12/17/2019 09:34 AM    CBC Latest Ref Rng & Units 12/15/2020 06/16/2020 12/17/2019  WBC 3.4 - 10.8 x10E3/uL 6.7 8.0 7.6  Hemoglobin 11.1 - 15.9 g/dL 14.4 14.8 13.8  Hematocrit 34.0 - 46.6 % 42.6 43.7 40.3  Platelets 150 - 450 x10E3/uL 207 214 204    No results found for:  VD25OH  Clinical ASCVD: Yes  The 10-year ASCVD risk score (Arnett DK, et al., 2019) is: 8.2%   Values used to calculate the score:     Age: 51 years     Sex: Female     Is Non-Hispanic African American: No     Diabetic: No     Tobacco smoker: Yes     Systolic Blood Pressure: 409 mmHg     Is BP treated: Yes     HDL Cholesterol: 52 mg/dL     Total Cholesterol: 139 mg/dL    Depression screen [facility] 2/9 12/18/2020 12/15/2020 12/15/2020  Decreased Interest 0 0 0  Down, Depressed, Hopeless 0 0 0  PHQ - 2 Score 0 0 0  Altered sleeping - 3 -  Tired, decreased energy - 3 -  Change in appetite - 2 -  Feeling bad or failure about yourself  - 0 -  Trouble concentrating - 0 -  Moving slowly or fidgety/restless - 0 -  Suicidal thoughts - 0 -  PHQ-9 Score - 8 -  Difficult doing work/chores - Not difficult at all -     Social History   Tobacco Use  Smoking Status Every Day   Packs/day: 2.00   Years: 45.00   Pack years: 90.00   Types: Cigarettes  Smokeless Tobacco Never  Tobacco Comments   Started using Nicotrol NS 12/17/20   BP Readings from Last 3 Encounters:  12/30/20 124/86  12/18/20 132/68  12/15/20 (!) 142/70   Pulse Readings from Last 3 Encounters:  12/30/20 68  12/18/20 88  12/15/20 60   Wt Readings from Last 3 Encounters:  12/30/20 98 lb (44.5 kg)  12/18/20 98 lb (44.5 kg)  12/15/20 96 lb 9.6 oz (43.8 kg)   BMI Readings from Last 3 Encounters:  12/30/20 17.36 kg/m  12/18/20 17.36 kg/m  12/15/20 17.11 kg/m    Assessment/Interventions: Review of patient past medical history, allergies, medications, health status, including review of consultants reports, laboratory and other test data, was performed as part of comprehensive evaluation and provision of chronic care management services.   SDOH:  (Social Determinants of Health) assessments and interventions performed: Yes  SDOH Screenings   Alcohol Screen: Not on file  Depression (PHQ2-9): Low Risk    PHQ-2 Score:  0  Financial Resource Strain: Not on file  Food Insecurity: No Food Insecurity   Worried About Charity fundraiser in the Last Year: Never true   Ran Out of Food in the Last Year: Never true  Housing: Low Risk    Last Housing Risk Score: 0  Physical Activity: Not on file  Social Connections: Not on file  Stress: Not on file  Tobacco Use: High Risk   Smoking Tobacco Use: Every Day   Smokeless Tobacco Use: Never  Transportation Needs: No Transportation Needs   Lack of Transportation (Medical): No   Lack of Transportation (Non-Medical): No    CCM Care Plan  Allergies  Allergen Reactions  Bupropion Other (See Comments)   Clarithromycin Other (See Comments)   Clopidogrel Other (See Comments)   Varenicline Other (See Comments)   Vitamin D Analogs Rash    High dosage of Vitamin D will cause rash High dosage of Vitamin D will cause rash High dosage of Vitamin D will cause rash    Prednisone Other (See Comments)    Hyperglycemia    Medications Reviewed Today     Reviewed by Lillard Anes, MD (Physician) on 12/30/20 at [place] List Status: <None>   Medication Order Taking? Sig Documenting Provider Last Dose Status Informant  alendronate (FOSAMAX) 70 MG tablet [id number] Yes Take 1 tablet (70 mg total) by mouth every 7 (seven) days. Take with a full glass of water on an empty stomach. Lillard Anes, MD Taking Active   ascorbic acid (VITAMIN C) 500 MG tablet [id number] Yes Take by mouth. [provider] Taking Active   aspirin 81 MG EC tablet [id number] Yes  [provider] Taking Active   glycopyrrolate (ROBINUL) 1 MG tablet [id number] Yes TAKE TWO TABLETS BY MOUTH TWICE DAILY Lillard Anes, MD Taking Active   metoprolol tartrate (LOPRESSOR) 25 MG tablet [id number] Yes TAKE 1 TABLET BY MOUTH TWICE A DAY Lillard Anes, MD Taking Active   naloxone [facility]) 4 MG/0.1ML LIQD nasal spray kit [id number] Yes USE 1 SPRAY IN ONE NOSTRIL. MAY  REPEAT IN OTHER NOSTRIL AFTER 2-3 MINS IF NEEDED [provider] Taking Active   Nicotine (NICOTROL NS) 10 MG/ML SOLN [id number] Yes 2 puff into each nostril as needed up to 5 times per hour. Maximum 40 doses per day. Lillard Anes, MD Taking Active   nitroGLYCERIN (NITROLINGUAL) 0.4 MG/SPRAY spray [id number] Yes Place under the tongue. [provider] Taking Active   oxyCODONE-acetaminophen (PERCOCET) 7.5-325 MG tablet [id number] Yes Take 1 tablet by mouth 4 (four) times daily as needed. [provider] Taking Active   quinapril (ACCUPRIL) 10 MG tablet [id number] Yes TAKE 1 TABLET BY MOUTH DAILY Lillard Anes, MD Taking Active   rosuvastatin (CRESTOR) 5 MG tablet [id number] Yes TAKE 1 TABLET BY MOUTH DAILY. Lillard Anes, MD Taking Active   STIOLTO RESPIMAT 2.5-2.5 MCG/ACT AERS [id number] Yes INHALE 1 PUFF INTO THE LUNGS IN THE MORNING AND AT BEDTIME Lillard Anes, MD Taking Active   triamcinolone cream (KENALOG) 0.1 % [id number] Yes APPLY A THIN LAYER TO THE AFFECTED AREA(S) TWICE A DAY. Lillard Anes, MD Taking Active   ZTLIDO 1.8 % [facility] [id number] Yes  [provider] Taking Active             Patient Active Problem List   Diagnosis Date Noted   Osteoporosis 08/12/2020   Adult BMI <19 kg/sq m 08/16/2019   Malnutrition of moderate degree ([place]) 08/16/2019   COPD (chronic obstructive pulmonary disease) ([place])    Mixed hyperlipidemia    Major depressive disorder, single episode, moderate (HCC)    Tobacco use disorder 10/03/2018   Bilateral carotid artery stenosis 01/04/2018   Hx of CABG 12/02/2016   Essential hypertension 11/17/2015   CAD in native artery 11/17/2015    Immunization History  Administered Date(s) Administered   Influenza-Unspecified 02/02/2019, 03/12/2020   Pneumococcal Conjugate-13 01/27/2015   Pneumococcal Polysaccharide-23 01/27/2016    Conditions to be addressed/monitored:  Hypertension,  Hyperlipidemia, Coronary Artery Disease, COPD and Depression  Care Plan : [place]  Updates made by Lane Hacker, Justin since 02/03/2021 12:00 AM  Problem: cad, htn, hld, copd   Priority: High  Onset Date: 08/05/2020     Long-Range Goal: Disease Management   Start Date: 08/05/2020  Expected End Date: 08/05/2021  Recent Progress: On track  Priority: High  Note:   Current Barriers:  Patient is interested in support for smoking cessation but has tried and failed various products in the past   Pharmacist Clinical Goal(s):  Patient will achieve control of smoking cessation as evidenced by continued use and recommended treatment through collaboration with PharmD and provider.   Interventions: 1:1 collaboration with Lillard Anes, MD regarding development and update of comprehensive plan of care as evidenced by provider attestation and co-signature Inter-disciplinary care team collaboration (see longitudinal plan of care) Comprehensive medication review performed; medication list updated in electronic medical record  Hypertension (BP goal <130/80) -Not ideally controlled -Current treatment: metoprolol tartrate 25 mg bid  Quinapril 10 mg daily  -Medications previously tried: none reported -Current home readings: 120-140s/60-80s mmHg -Current dietary habits: Lives alone and doesn't cook for herself a lot. Fish and shrimp yesterday. Filet mignon in [place] on Sunday. Toast streudel, cereal or bacon and eggs for breakfast. Likes fruits and vegetables but hard to use before they go bad.  -Current exercise habits: Uses above ground pool at her home during warmer weather. Back pain limits exercise.  -Denies hypotensive/hypertensive symptoms -Educated on BP goals and benefits of medications for prevention of heart attack, stroke and kidney damage; Daily salt intake goal < 2300 mg; Exercise goal of 150 minutes per week; Importance of home blood pressure  monitoring; -Counseled to monitor BP at home occasionally, document, and provide log at future appointments -Counseled on diet and exercise extensively Collaborated with nurse to have updated prescription for quinapril once daily to pharmacy. Patient is not checking bp regularly but has occasional reading above goal. Patient continues to smoke and discussed smoking cessation.   Hyperlipidemia/CAD: (LDL goal < 55) -Controlled -Current treatment: aspirin 81 mg ec  crestor 5 mg daily  Nitroglycerin 0.4 mg/spray prn -Medications previously tried: none reported  -Current dietary patterns: Drinks 2% milk daily. Rarely eats cheese.  -Current exercise habits: limited due to back pain.  -Educated on Cholesterol goals;  Benefits of statin for ASCVD risk reduction; Importance of limiting foods high in cholesterol; Exercise goal of 150 minutes per week; -Counseled on diet and exercise extensively Recommended to continue current medication  COPD (Goal: control symptoms and prevent exacerbations) -Controlled -Current treatment  Stiolto 1 puff into the lungs in am and hs -Medications previously tried: none reported  -Gold Grade: Gold 2 (FEV1 50-79%) -Pulmonary function testing: 08/16/2019 - 61% -Exacerbations requiring treatment in last 6 months: none reported -Patient reports consistent use of maintenance inhaler -Frequency of rescue inhaler use: none per patient  -Counseled on Benefits of consistent maintenance inhaler use -Recommended to continue current medication  Osteoporosis / Osteopenia (Goal prevent fractures) -Not ideally controlled -Last DEXA Scan: 07/2020   T-Score femoral neck: -3.2  T-Score lumbar spine: -3.5 -Patient is a candidate for pharmacologic treatment due to T-Score < -2.5 in lumbar spine -Current treatment  Alendronate 70 mg weekly  Calcium with D supplementation recommended by Dr. Henrene Pastor and pharmacist  -Medications previously tried: n/a  -Recommend [id number] units  of vitamin D daily. Recommend 1200 mg of calcium daily from dietary and supplemental sources. Recommend weight-bearing and muscle strengthening exercises for building and maintaining bone density. -Counseled on diet and exercise extensively Educated on calcium and vitamin d recommendations. Patient does drink  boost but has very little intake of food due to appetite.   Tobacco use (Goal smoking cessation ) -Uncontrolled - smoking 1 packs per day (October 2022), 2 PPD (September 2022) -Previous quit attempts: Chantix - allergic reaction, patches - didn't work, wellbutrin, gum - dentures may prohibit chewing and parking -Current treatment  None reported -Patient smokes Within 30 minutes of waking -Patient triggers include: watching television, driving and finishing a meal -On a scale of 1-10, reports MOTIVATION to quit is 6 -On a scale of 1-10, reports CONFIDENCE in quitting is 6 -Provided contact information for [place] [facility]uit Line (1-800-QUIT-NOW) and encouraged patient to reach out to this group for support. August 2022: Counseled on benefits of smoking cessation. Discussed options of smoking cessation and patient is interested in inhaler. Insurance will not approve inhaler until nasal spray has been tried. Will discuss with patient and then Dr. Henrene Pastor if patient is ready to proceed.  October 2022: Nicotine nasal spray is causing ADR's (Congestion and sore throat), will ask PCP for Inhaled Nicotine. Patient smoking 1ppd (Down from 2PPD). Goal set for 1/2PPD by Nov 16th  Pain (Goal: manage symptoms of pain with specialist) -Controlled -Current treatment  Oxycodone-acetaminophen 7.5-325 mg QID  Naloxone 4 mg/0.1 ml nasal spray 1 spray in one nostril may repeat in other nostril after 2-3 minutes if needed -Medications previously tried: none reported   -Recommended to continue current medication  Sweating (Goal: minimize sweating) -Controlled -Current treatment  glycopyrrolate 1 mg 2 tablets twice  daily  -Medications previously tried: none reported  -Recommended to continue current medication  Health Maintenance -Vaccine gaps: COVID - not interested, Shingrix - hasn't had , TDAP - patient not interested at this time -Current therapy:  vitamin c 500 mg daily -Educated on Cost vs benefit of each product must be carefully weighed by individual consumer -Patient is satisfied with current therapy and denies issues -Recommended to continue current medication    Patient Goals/Self-Care Activities Patient will:  - take medications as prescribed focus on medication adherence by considering a pill box.  check blood pressure weekly, document, and provide at future appointments engage in dietary modifications by limiting salt and focusing on heart-healthy diet.   Follow Up Plan: Telephone follow up appointment with care management team member scheduled for: Pharmacist 02/2021 to follow-up on smoking cessation        Medication Assistance: None required.  Patient affirms current coverage meets needs.  Patient's preferred pharmacy is:  [facility] [address] [address] Phone: [phone] Fax: [phone]  Uses pill box? No - keeps stored on shelf in medicine cabinet Pt endorses good compliance  We discussed: Benefits of medication synchronization, packaging and delivery as well as enhanced pharmacist oversight with [employer]. Patient decided to: Continue current medication management strategy  Care Plan and Follow Up Patient Decision:  Patient agrees to Care Plan and Follow-up.  Plan: Telephone follow up appointment with care management team member scheduled for:  Pharmacy Team will follow-up with smoking cessation progress 03/04/21  [place] Constable, Pharm.D. - [phone]

## 2021-02-11 ENCOUNTER — Telehealth (INDEPENDENT_AMBULATORY_CARE_PROVIDER_SITE_OTHER): Payer: Self-pay | Admitting: Neurology

## 2021-02-11 NOTE — Telephone Encounter (Signed)
Lvm to let patient know he needs to change her appointment to in person visit on 11/09 or rescheduled to a [place] specific day that Dr, Rosendo Gros has scheduled.

## 2021-02-16 ENCOUNTER — Telehealth (INDEPENDENT_AMBULATORY_CARE_PROVIDER_SITE_OTHER): Payer: Self-pay

## 2021-02-16 DIAGNOSIS — E782 Mixed hyperlipidemia: Secondary | ICD-10-CM

## 2021-02-16 DIAGNOSIS — I10 Essential (primary) hypertension: Secondary | ICD-10-CM | POA: Diagnosis not present

## 2021-02-16 NOTE — Telephone Encounter (Signed)
Left message for patient that she needs to come in, in person. I rescheduled her for the same time and day, 02/25/21, but changed the visit to in person. Advised to call if this does not work so she can re schedule.

## 2021-02-19 ENCOUNTER — Telehealth (INDEPENDENT_AMBULATORY_CARE_PROVIDER_SITE_OTHER): Payer: Self-pay | Admitting: Neurology

## 2021-02-19 ENCOUNTER — Encounter (INDEPENDENT_AMBULATORY_CARE_PROVIDER_SITE_OTHER): Payer: Self-pay | Admitting: Internal Medicine

## 2021-02-19 ENCOUNTER — Encounter: Payer: Self-pay | Admitting: Family Practice

## 2021-02-19 DIAGNOSIS — D8989 Other specified disorders involving the immune mechanism, not elsewhere classified: Secondary | ICD-10-CM

## 2021-02-19 DIAGNOSIS — N959 Unspecified menopausal and perimenopausal disorder: Secondary | ICD-10-CM

## 2021-02-19 NOTE — Telephone Encounter (Signed)
Pt is requesting a call back to discuss the need to come in for in person appt vs telemed. She is not able to make 11/9 due to a schedule conflict. Next available booked for now (04/29/21). Please advise     Chrissy  [place]: [phone]

## 2021-02-22 DIAGNOSIS — G894 Chronic pain syndrome: Secondary | ICD-10-CM | POA: Diagnosis not present

## 2021-02-22 DIAGNOSIS — M48061 Spinal stenosis, lumbar region without neurogenic claudication: Secondary | ICD-10-CM | POA: Diagnosis not present

## 2021-02-22 DIAGNOSIS — M47816 Spondylosis without myelopathy or radiculopathy, lumbar region: Secondary | ICD-10-CM | POA: Diagnosis not present

## 2021-02-22 DIAGNOSIS — M5090 Cervical disc disorder, unspecified, unspecified cervical region: Secondary | ICD-10-CM | POA: Diagnosis not present

## 2021-02-22 DIAGNOSIS — M461 Sacroiliitis, not elsewhere classified: Secondary | ICD-10-CM | POA: Diagnosis not present

## 2021-02-22 DIAGNOSIS — M5136 Other intervertebral disc degeneration, lumbar region: Secondary | ICD-10-CM | POA: Diagnosis not present

## 2021-02-22 DIAGNOSIS — M546 Pain in thoracic spine: Secondary | ICD-10-CM | POA: Diagnosis not present

## 2021-02-23 ENCOUNTER — Encounter (INDEPENDENT_AMBULATORY_CARE_PROVIDER_SITE_OTHER): Payer: Self-pay | Admitting: Family Practice

## 2021-02-23 ENCOUNTER — Other Ambulatory Visit: Payer: Self-pay | Admitting: Legal Medicine

## 2021-02-23 DIAGNOSIS — M545 Low back pain, unspecified: Secondary | ICD-10-CM

## 2021-02-23 NOTE — Telephone Encounter (Signed)
From: [name]  To: Christin Bach, MD  Sent: 02/23/2021 1:50 PM PST  Subject: physical therapy    I have physical therapy sessions that I need to schedule at [facility] in [facility]. They need to see what I need to work on via [employer] referrals. The things we decided I need to work on, (sciatica, rotator cuff tear, and balance). In order to get medicare to cover some sessions they need to see [place] referral and information. Can someone take care of this for me?  It can be faxed to [phone].  Thanks for this and please let me know if I need to do anything else.  [name]

## 2021-02-23 NOTE — Addendum Note (Signed)
Addended byKathrynn Humble on: 02/23/2021 12:31 PM     Modules accepted: Orders

## 2021-02-25 ENCOUNTER — Telehealth (INDEPENDENT_AMBULATORY_CARE_PROVIDER_SITE_OTHER): Payer: Medicare Other | Admitting: Neurology

## 2021-02-25 ENCOUNTER — Encounter (INDEPENDENT_AMBULATORY_CARE_PROVIDER_SITE_OTHER): Payer: Medicare Other | Admitting: Neurology

## 2021-02-26 ENCOUNTER — Encounter (INDEPENDENT_AMBULATORY_CARE_PROVIDER_SITE_OTHER): Payer: Medicare Other | Admitting: Nurse Practitioner

## 2021-02-26 NOTE — Telephone Encounter (Signed)
Covering Dr Edi's inbox  Referral signed

## 2021-03-03 ENCOUNTER — Telehealth: Payer: Self-pay

## 2021-03-03 DIAGNOSIS — M5136 Other intervertebral disc degeneration, lumbar region: Secondary | ICD-10-CM | POA: Diagnosis not present

## 2021-03-03 DIAGNOSIS — M47816 Spondylosis without myelopathy or radiculopathy, lumbar region: Secondary | ICD-10-CM | POA: Diagnosis not present

## 2021-03-03 DIAGNOSIS — M461 Sacroiliitis, not elsewhere classified: Secondary | ICD-10-CM | POA: Diagnosis not present

## 2021-03-03 DIAGNOSIS — Z1389 Encounter for screening for other disorder: Secondary | ICD-10-CM | POA: Diagnosis not present

## 2021-03-03 DIAGNOSIS — M48061 Spinal stenosis, lumbar region without neurogenic claudication: Secondary | ICD-10-CM | POA: Diagnosis not present

## 2021-03-03 DIAGNOSIS — Z79891 Long term (current) use of opiate analgesic: Secondary | ICD-10-CM | POA: Diagnosis not present

## 2021-03-03 DIAGNOSIS — G894 Chronic pain syndrome: Secondary | ICD-10-CM | POA: Diagnosis not present

## 2021-03-03 NOTE — Telephone Encounter (Signed)
Placed call to patient to try to have her come in tomorrow, 03/04/2021 to our [place] clinic. She missed previous appointment.  Patient did not answer and this RN left a VM

## 2021-03-03 NOTE — Progress Notes (Signed)
Left vm reminding pt of telephone visit with CPP tomorrow at 2  Roxana Hires, [facility] Clinical Pharmacist Assistant  [phone]

## 2021-03-04 ENCOUNTER — Other Ambulatory Visit: Payer: Self-pay

## 2021-03-04 ENCOUNTER — Ambulatory Visit (INDEPENDENT_AMBULATORY_CARE_PROVIDER_SITE_OTHER): Payer: Medicare HMO

## 2021-03-04 DIAGNOSIS — M5136 Other intervertebral disc degeneration, lumbar region: Secondary | ICD-10-CM | POA: Diagnosis not present

## 2021-03-04 DIAGNOSIS — M461 Sacroiliitis, not elsewhere classified: Secondary | ICD-10-CM | POA: Diagnosis not present

## 2021-03-04 DIAGNOSIS — M48061 Spinal stenosis, lumbar region without neurogenic claudication: Secondary | ICD-10-CM | POA: Diagnosis not present

## 2021-03-04 DIAGNOSIS — M546 Pain in thoracic spine: Secondary | ICD-10-CM | POA: Diagnosis not present

## 2021-03-04 DIAGNOSIS — M5090 Cervical disc disorder, unspecified, unspecified cervical region: Secondary | ICD-10-CM | POA: Diagnosis not present

## 2021-03-04 DIAGNOSIS — M47816 Spondylosis without myelopathy or radiculopathy, lumbar region: Secondary | ICD-10-CM | POA: Diagnosis not present

## 2021-03-04 DIAGNOSIS — G894 Chronic pain syndrome: Secondary | ICD-10-CM | POA: Diagnosis not present

## 2021-03-04 NOTE — Progress Notes (Signed)
Chronic Care Management Pharmacy Note  03/04/2021 Name:  Dorothy Parker MRN:  [id number] DOB:  08/20/1960  Subjective: Dorothy Parker is an 60 y.o. year old female who is a primary patient of Henrene Pastor, Zeb Comfort, MD.  The CCM team was consulted for assistance with disease management and care coordination needs.    Plan Updates:   If patient has tried and failed nicotine nasal spray (She has, caused sore throat and congestion, couldn't sing at church since starting medication), Nicotine inhaler prior authorization can be completed. Will ask PCP to send in prescription so patient can start path to quitting   Engaged with patient by telephone for follow up visit in response to provider referral for pharmacy case management and/or care coordination services.   Consent to Services:  The patient was given the following information about Chronic Care Management services today, agreed to services, and gave verbal consent: 1. CCM service includes personalized support from designated clinical staff supervised by the primary care provider, including individualized plan of care and coordination with other care providers 2. 24/7 contact phone numbers for assistance for urgent and routine care needs. 3. Service will only be billed when office clinical staff spend 20 minutes or more in a month to coordinate care. 4. Only one practitioner may furnish and bill the service in a calendar month. 5.The patient may stop CCM services at any time (effective at the end of the month) by phone call to the office staff. 6. The patient will be responsible for cost sharing (co-pay) of up to 20% of the service fee (after annual deductible is met). Patient agreed to services and consent obtained.  Patient Care Team: Lillard Anes, MD as PCP - General (Family Medicine) Flossie Buffy., MD as Referring Physician (Cardiology) Dorthula Rue, MD as Referring Physician (Vascular Surgery) Duanne Limerick, OD as Consulting Physician (Ophthalmology) Burnice Logan, [facility] (Inactive) as Pharmacist (Pharmacist)  Recent office visits:  07/30/20- order placed for bone density   06/16/20. Dr. Henrene Pastor PCP, hyperlipidemia, follow up 19mo, no medication changes, CBC normal, kidney and liver tests normal, Cholesterol normal,  CCM referral, follow up 653mo   Recent consult visits:  11/06/2020 - Vascular -  continue antiplatelet and statin. Repeat carotid duplex in 1 year if no symptoms.   08/18/2020 - cardiology - recommend smoking cessation. Recommend continuing to follow-up with vascular/doppler scan.   Hospital visits:  None in previous 6 months  Objective:  Lab Results  Component Value Date   CREATININE 0.67 12/15/2020   BUN 17 12/15/2020   GFRNONAA 97 12/17/2019   GFRAA 112 12/17/2019   NA 137 12/15/2020   K 4.4 12/15/2020   CALCIUM 9.5 12/15/2020   CO2 25 12/15/2020   GLUCOSE 84 12/15/2020    No results found for: HGBA1C, FRUCTOSAMINE, GFR, MICROALBUR  Last diabetic Eye exam: No results found for: HMDIABEYEEXA  Last diabetic Foot exam: No results found for: HMDIABFOOTEX   Lab Results  Component Value Date   CHOL 139 12/15/2020   HDL 52 12/15/2020   LDLCALC 56 12/15/2020   TRIG 193 (H) 12/15/2020   CHOLHDL 2.7 12/15/2020    Hepatic Function Latest Ref Rng & Units 12/15/2020 06/16/2020 12/17/2019  Total Protein 6.0 - 8.5 g/dL 7.1 7.0 6.5  Albumin 3.8 - 4.8 g/dL 4.4 4.6 4.4  AST 0 - 40 IU/L _0 ALT 0 - 32 IU/L _1 Alk Phosphatase 44 - 121 IU/L 74 94  97  Total Bilirubin 0.0 - 1.2 mg/dL 0.4 0.4 0.4    Lab Results  Component Value Date/Time   TSH 0.688 12/17/2019 09:34 AM    CBC Latest Ref Rng & Units 12/15/2020 06/16/2020 12/17/2019  WBC 3.4 - 10.8 x10E3/uL 6.7 8.0 7.6  Hemoglobin 11.1 - 15.9 g/dL 14.4 14.8 13.8  Hematocrit 34.0 - 46.6 % 42.6 43.7 40.3  Platelets 150 - 450 x10E3/uL 207 214 204    No results found for: VD25OH  Clinical ASCVD: Yes  The  10-year ASCVD risk score (Arnett DK, et al., 2019) is: 8.2%   Values used to calculate the score:     Age: 51 years     Sex: Female     Is Non-Hispanic African American: No     Diabetic: No     Tobacco smoker: Yes     Systolic Blood Pressure: 001 mmHg     Is BP treated: Yes     HDL Cholesterol: 52 mg/dL     Total Cholesterol: 139 mg/dL    Depression screen [facility] 2/9 12/18/2020 12/15/2020 12/15/2020  Decreased Interest 0 0 0  Down, Depressed, Hopeless 0 0 0  PHQ - 2 Score 0 0 0  Altered sleeping - 3 -  Tired, decreased energy - 3 -  Change in appetite - 2 -  Feeling bad or failure about yourself  - 0 -  Trouble concentrating - 0 -  Moving slowly or fidgety/restless - 0 -  Suicidal thoughts - 0 -  PHQ-9 Score - 8 -  Difficult doing work/chores - Not difficult at all -     Social History   Tobacco Use  Smoking Status Every Day   Packs/day: 1.00   Years: 45.00   Pack years: 45.00   Types: Cigarettes  Smokeless Tobacco Never  Tobacco Comments   Started using Nicotrol NS 12/17/20   BP Readings from Last 3 Encounters:  12/30/20 124/86  12/18/20 132/68  12/15/20 (!) 142/70   Pulse Readings from Last 3 Encounters:  12/30/20 68  12/18/20 88  12/15/20 60   Wt Readings from Last 3 Encounters:  12/30/20 98 lb (44.5 kg)  12/18/20 98 lb (44.5 kg)  12/15/20 96 lb 9.6 oz (43.8 kg)   BMI Readings from Last 3 Encounters:  12/30/20 17.36 kg/m  12/18/20 17.36 kg/m  12/15/20 17.11 kg/m    Assessment/Interventions: Review of patient past medical history, allergies, medications, health status, including review of consultants reports, laboratory and other test data, was performed as part of comprehensive evaluation and provision of chronic care management services.   SDOH:  (Social Determinants of Health) assessments and interventions performed: Yes  SDOH Screenings   Alcohol Screen: Not on file  Depression (PHQ2-9): Low Risk    PHQ-2 Score: 0  Financial Resource Strain: Not  on file  Food Insecurity: No Food Insecurity   Worried About Charity fundraiser in the Last Year: Never true   Ran Out of Food in the Last Year: Never true  Housing: Low Risk    Last Housing Risk Score: 0  Physical Activity: Not on file  Social Connections: Not on file  Stress: Not on file  Tobacco Use: High Risk   Smoking Tobacco Use: Every Day   Smokeless Tobacco Use: Never   Passive Exposure: Not on file  Transportation Needs: No Transportation Needs   Lack of Transportation (Medical): No   Lack of Transportation (Non-Medical): No    CCM Care Plan  Allergies  Allergen  Reactions   Bupropion Other (See Comments)   Clarithromycin Other (See Comments)   Clopidogrel Other (See Comments)   Varenicline Other (See Comments)   Vitamin D Analogs Rash    High dosage of Vitamin D will cause rash High dosage of Vitamin D will cause rash High dosage of Vitamin D will cause rash    Prednisone Other (See Comments)    Hyperglycemia    Medications Reviewed Today     Reviewed by Lillard Anes, MD (Physician) on 12/30/20 at [place] List Status: <None>   Medication Order Taking? Sig Documenting Provider Last Dose Status Informant  alendronate (FOSAMAX) 70 MG tablet [id number] Yes Take 1 tablet (70 mg total) by mouth every 7 (seven) days. Take with a full glass of water on an empty stomach. Lillard Anes, MD Taking Active   ascorbic acid (VITAMIN C) 500 MG tablet [id number] Yes Take by mouth. [provider] Taking Active   aspirin 81 MG EC tablet [id number] Yes  [provider] Taking Active   glycopyrrolate (ROBINUL) 1 MG tablet [id number] Yes TAKE TWO TABLETS BY MOUTH TWICE DAILY Lillard Anes, MD Taking Active   metoprolol tartrate (LOPRESSOR) 25 MG tablet [id number] Yes TAKE 1 TABLET BY MOUTH TWICE A DAY Lillard Anes, MD Taking Active   naloxone [facility]) 4 MG/0.1ML LIQD nasal spray kit [id number] Yes USE 1 SPRAY IN ONE NOSTRIL. MAY  REPEAT IN OTHER NOSTRIL AFTER 2-3 MINS IF NEEDED [provider] Taking Active   Nicotine (NICOTROL NS) 10 MG/ML SOLN [id number] Yes 2 puff into each nostril as needed up to 5 times per hour. Maximum 40 doses per day. Lillard Anes, MD Taking Active   nitroGLYCERIN (NITROLINGUAL) 0.4 MG/SPRAY spray [id number] Yes Place under the tongue. [provider] Taking Active   oxyCODONE-acetaminophen (PERCOCET) 7.5-325 MG tablet [id number] Yes Take 1 tablet by mouth 4 (four) times daily as needed. [provider] Taking Active   quinapril (ACCUPRIL) 10 MG tablet [id number] Yes TAKE 1 TABLET BY MOUTH DAILY Lillard Anes, MD Taking Active   rosuvastatin (CRESTOR) 5 MG tablet [id number] Yes TAKE 1 TABLET BY MOUTH DAILY. Lillard Anes, MD Taking Active   STIOLTO RESPIMAT 2.5-2.5 MCG/ACT AERS [id number] Yes INHALE 1 PUFF INTO THE LUNGS IN THE MORNING AND AT BEDTIME Lillard Anes, MD Taking Active   triamcinolone cream (KENALOG) 0.1 % [id number] Yes APPLY A THIN LAYER TO THE AFFECTED AREA(S) TWICE A DAY. Lillard Anes, MD Taking Active   ZTLIDO 1.8 % [facility] [id number] Yes  [provider] Taking Active             Patient Active Problem List   Diagnosis Date Noted   Osteoporosis 08/12/2020   Adult BMI <19 kg/sq m 08/16/2019   Malnutrition of moderate degree ([place]) 08/16/2019   COPD (chronic obstructive pulmonary disease) ([place])    Mixed hyperlipidemia    Major depressive disorder, single episode, moderate (HCC)    Tobacco use disorder 10/03/2018   Bilateral carotid artery stenosis 01/04/2018   Hx of CABG 12/02/2016   Essential hypertension 11/17/2015   CAD in native artery 11/17/2015    Immunization History  Administered Date(s) Administered   Influenza-Unspecified 02/02/2019, 03/12/2020   Pneumococcal Conjugate-13 01/27/2015   Pneumococcal Polysaccharide-23 01/27/2016    Conditions to be addressed/monitored:  Hypertension,  Hyperlipidemia, Coronary Artery Disease, COPD and Depression  Care Plan : [place]  Updates made by Lane Hacker, [place] since 03/04/2021 12:00  AM     Problem: cad, htn, hld, copd   Priority: High  Onset Date: 08/05/2020     Long-Range Goal: Disease Management   Start Date: 08/05/2020  Expected End Date: 08/05/2021  Recent Progress: On track  Priority: High  Note:   Current Barriers:  Patient is interested in support for smoking cessation but has tried and failed various products in the past   Pharmacist Clinical Goal(s):  Patient will achieve control of smoking cessation as evidenced by continued use and recommended treatment through collaboration with PharmD and provider.   Interventions: 1:1 collaboration with Lillard Anes, MD regarding development and update of comprehensive plan of care as evidenced by provider attestation and co-signature Inter-disciplinary care team collaboration (see longitudinal plan of care) Comprehensive medication review performed; medication list updated in electronic medical record  Hypertension (BP goal <130/80) -Not ideally controlled -Current treatment: metoprolol tartrate 25 mg bid  Quinapril 10 mg daily  -Medications previously tried: none reported -Current home readings: 120-140s/60-80s mmHg -Current dietary habits: Lives alone and doesn't cook for herself a lot. Fish and shrimp yesterday. Filet mignon in [place] on Sunday. Toast streudel, cereal or bacon and eggs for breakfast. Likes fruits and vegetables but hard to use before they go bad.  -Current exercise habits: Uses above ground pool at her home during warmer weather. Back pain limits exercise.  -Denies hypotensive/hypertensive symptoms -Educated on BP goals and benefits of medications for prevention of heart attack, stroke and kidney damage; Daily salt intake goal < 2300 mg; Exercise goal of 150 minutes per week; Importance of home blood pressure  monitoring; -Counseled to monitor BP at home occasionally, document, and provide log at future appointments -Counseled on diet and exercise extensively Collaborated with nurse to have updated prescription for quinapril once daily to pharmacy. Patient is not checking bp regularly but has occasional reading above goal. Patient continues to smoke and discussed smoking cessation.   Hyperlipidemia/CAD: (LDL goal < 55) -Controlled -Current treatment: aspirin 81 mg ec  crestor 5 mg daily  Nitroglycerin 0.4 mg/spray prn -Medications previously tried: none reported  -Current dietary patterns: Drinks 2% milk daily. Rarely eats cheese.  -Current exercise habits: limited due to back pain.  -Educated on Cholesterol goals;  Benefits of statin for ASCVD risk reduction; Importance of limiting foods high in cholesterol; Exercise goal of 150 minutes per week; -Counseled on diet and exercise extensively Recommended to continue current medication  COPD (Goal: control symptoms and prevent exacerbations) -Controlled -Current treatment  Stiolto 1 puff into the lungs in am and hs -Medications previously tried: none reported  -Gold Grade: Gold 2 (FEV1 50-79%) -Pulmonary function testing: 08/16/2019 - 61% -Exacerbations requiring treatment in last 6 months: none reported -Patient reports consistent use of maintenance inhaler -Frequency of rescue inhaler use: none per patient  -Counseled on Benefits of consistent maintenance inhaler use -Recommended to continue current medication  Osteoporosis / Osteopenia (Goal prevent fractures) -Not ideally controlled -Last DEXA Scan: 07/2020   T-Score femoral neck: -3.2  T-Score lumbar spine: -3.5 -Patient is a candidate for pharmacologic treatment due to T-Score < -2.5 in lumbar spine -Current treatment  Alendronate 70 mg weekly  Calcium with D supplementation recommended by Dr. Henrene Pastor and pharmacist  -Medications previously tried: n/a  -Recommend [phone] units  of vitamin D daily. Recommend 1200 mg of calcium daily from dietary and supplemental sources. Recommend weight-bearing and muscle strengthening exercises for building and maintaining bone density. -Counseled on diet and exercise extensively Educated on calcium and vitamin  d recommendations. Patient does drink boost but has very little intake of food due to appetite.   Tobacco use (Goal smoking cessation ) -Uncontrolled - smoking 1 packs per day (October 2022), 2 PPD (September 2022) -Previous quit attempts: Chantix - allergic reaction, patches - didn't work, wellbutrin, gum - dentures may prohibit chewing and parking -Current treatment  None reported -Patient smokes Within 30 minutes of waking -Patient triggers include: watching television, driving and finishing a meal -On a scale of 1-10, reports MOTIVATION to quit is 6 -On a scale of 1-10, reports CONFIDENCE in quitting is 6 -Provided contact information for  Quit Line (1-800-QUIT-NOW) and encouraged patient to reach out to this group for support. August 2022: Counseled on benefits of smoking cessation. Discussed options of smoking cessation and patient is interested in inhaler. Insurance will not approve inhaler until nasal spray has been tried. Will discuss with patient and then Dr. Henrene Pastor if patient is ready to proceed.  October 2022: Nicotine nasal spray is causing ADR's (Congestion and sore throat), will ask PCP for Inhaled Nicotine. Patient smoking 1ppd (Down from 2PPD). Goal set for 1/2PPD by Nov 16th November 2022: Patient hasn't gotten inhaler and is still smoking 1PPD. New goal is 3/4 PPD by December and I will re-ask PCP for inhaler  Pain (Goal: manage symptoms of pain with specialist) -Controlled -Current treatment  Oxycodone-acetaminophen 7.5-325 mg QID  Naloxone 4 mg/0.1 ml nasal spray 1 spray in one nostril may repeat in other nostril after 2-3 minutes if needed -Medications previously tried: none reported   -Recommended to  continue current medication  Sweating (Goal: minimize sweating) -Controlled -Current treatment  glycopyrrolate 1 mg 2 tablets twice daily  -Medications previously tried: none reported  -Recommended to continue current medication  Health Maintenance -Vaccine gaps: COVID - not interested, Shingrix - hasn't had , TDAP - patient not interested at this time -Current therapy:  vitamin c 500 mg daily -Educated on Cost vs benefit of each product must be carefully weighed by individual consumer -Patient is satisfied with current therapy and denies issues -Recommended to continue current medication    Patient Goals/Self-Care Activities Patient will:  - take medications as prescribed focus on medication adherence by considering a pill box.  check blood pressure weekly, document, and provide at future appointments engage in dietary modifications by limiting salt and focusing on heart-healthy diet.   Follow Up Plan: Telephone follow up appointment with care management team member scheduled for: Pharmacist 03/2021 to follow-up on smoking cessation        Medication Assistance: None required.  Patient affirms current coverage meets needs.  Patient's preferred pharmacy is:  [facility], [address] [place] [address] Phone: [phone] Fax: [phone]  Uses pill box? No - keeps stored on shelf in medicine cabinet Pt endorses good compliance  We discussed: Benefits of medication synchronization, packaging and delivery as well as enhanced pharmacist oversight with [employer]. Patient decided to: Continue current medication management strategy  Care Plan and Follow Up Patient Decision:  Patient agrees to Care Plan and Follow-up.  Plan: Telephone follow up appointment with care management team member scheduled for:  Pharmacy Team will follow-up with smoking cessation progress 03/31/21  [place] Constable, Pharm.D. -  [phone]

## 2021-03-04 NOTE — Patient Instructions (Signed)
Visit Information   Goals Addressed   None    Patient Care Plan: CCM Pharmacy Care Plan     Problem Identified: cad, htn, hld, copd   Priority: High  Onset Date: 08/05/2020     Long-Range Goal: Disease Management   Start Date: 08/05/2020  Expected End Date: 08/05/2021  Recent Progress: On track  Priority: High  Note:   Current Barriers:  Patient is interested in support for smoking cessation but has tried and failed various products in the past   Pharmacist Clinical Goal(s):  Patient will achieve control of smoking cessation as evidenced by continued use and recommended treatment through collaboration with PharmD and provider.   Interventions: 1:1 collaboration with Abigail Miyamoto, MD regarding development and update of comprehensive plan of care as evidenced by provider attestation and co-signature Inter-disciplinary care team collaboration (see longitudinal plan of care) Comprehensive medication review performed; medication list updated in electronic medical record  Hypertension (BP goal <130/80) -Not ideally controlled -Current treatment: metoprolol tartrate 25 mg bid  Quinapril 10 mg daily  -Medications previously tried: none reported -Current home readings: 120-140s/60-80s mmHg -Current dietary habits: Lives alone and doesn't cook for herself a lot. Fish and shrimp yesterday. Filet mignon in airfryer on Sunday. Toast streudel, cereal or bacon and eggs for breakfast. Likes fruits and vegetables but hard to use before they go bad.  -Current exercise habits: Uses above ground pool at her home during warmer weather. Back pain limits exercise.  -Denies hypotensive/hypertensive symptoms -Educated on BP goals and benefits of medications for prevention of heart attack, stroke and kidney damage; Daily salt intake goal < 2300 mg; Exercise goal of 150 minutes per week; Importance of home blood pressure monitoring; -Counseled to monitor BP at home occasionally, document,  and provide log at future appointments -Counseled on diet and exercise extensively Collaborated with nurse to have updated prescription for quinapril once daily to pharmacy. Patient is not checking bp regularly but has occasional reading above goal. Patient continues to smoke and discussed smoking cessation.   Hyperlipidemia/CAD: (LDL goal < 55) -Controlled -Current treatment: aspirin 81 mg ec  crestor 5 mg daily  Nitroglycerin 0.4 mg/spray prn -Medications previously tried: none reported  -Current dietary patterns: Drinks 2% milk daily. Rarely eats cheese.  -Current exercise habits: limited due to back pain.  -Educated on Cholesterol goals;  Benefits of statin for ASCVD risk reduction; Importance of limiting foods high in cholesterol; Exercise goal of 150 minutes per week; -Counseled on diet and exercise extensively Recommended to continue current medication  COPD (Goal: control symptoms and prevent exacerbations) -Controlled -Current treatment  Stiolto 1 puff into the lungs in am and hs -Medications previously tried: none reported  -Gold Grade: Gold 2 (FEV1 50-79%) -Pulmonary function testing: 08/16/2019 - 61% -Exacerbations requiring treatment in last 6 months: none reported -Patient reports consistent use of maintenance inhaler -Frequency of rescue inhaler use: none per patient  -Counseled on Benefits of consistent maintenance inhaler use -Recommended to continue current medication  Osteoporosis / Osteopenia (Goal prevent fractures) -Not ideally controlled -Last DEXA Scan: 07/2020   T-Score femoral neck: -3.2  T-Score lumbar spine: -3.5 -Patient is a candidate for pharmacologic treatment due to T-Score < -2.5 in lumbar spine -Current treatment  Alendronate 70 mg weekly  Calcium with D supplementation recommended by Dr. Marina Goodell and pharmacist  -Medications previously tried: n/a  -Recommend [phone] units of vitamin D daily. Recommend 1200 mg of calcium daily from dietary  and supplemental sources. Recommend weight-bearing and muscle  strengthening exercises for building and maintaining bone density. -Counseled on diet and exercise extensively Educated on calcium and vitamin d recommendations. Patient does drink boost but has very little intake of food due to appetite.   Tobacco use (Goal smoking cessation ) -Uncontrolled - smoking 1 packs per day (October 2022), 2 PPD (September 2022) -Previous quit attempts: Chantix - allergic reaction, patches - didn't work, wellbutrin, gum - dentures may prohibit chewing and parking -Current treatment  None reported -Patient smokes Within 30 minutes of waking -Patient triggers include: watching television, driving and finishing a meal -On a scale of 1-10, reports MOTIVATION to quit is 6 -On a scale of 1-10, reports CONFIDENCE in quitting is 6 -Provided contact information for [place] Quit Line (1-800-QUIT-NOW) and encouraged patient to reach out to this group for support. August 2022: Counseled on benefits of smoking cessation. Discussed options of smoking cessation and patient is interested in inhaler. Insurance will not approve inhaler until nasal spray has been tried. Will discuss with patient and then Dr. Marina Goodell if patient is ready to proceed.  October 2022: Nicotine nasal spray is causing ADR's (Congestion and sore throat), will ask PCP for Inhaled Nicotine. Patient smoking 1ppd (Down from 2PPD). Goal set for 1/2PPD by Nov 16th November 2022: Patient hasn't gotten inhaler and is still smoking 1PPD. New goal is 3/4 PPD by December and I will re-ask PCP for inhaler  Pain (Goal: manage symptoms of pain with specialist) -Controlled -Current treatment  Oxycodone-acetaminophen 7.5-325 mg QID  Naloxone 4 mg/0.1 ml nasal spray 1 spray in one nostril may repeat in other nostril after 2-3 minutes if needed -Medications previously tried: none reported   -Recommended to continue current medication  Sweating (Goal: minimize  sweating) -Controlled -Current treatment  glycopyrrolate 1 mg 2 tablets twice daily  -Medications previously tried: none reported  -Recommended to continue current medication  Health Maintenance -Vaccine gaps: COVID - not interested, Shingrix - hasn't had , TDAP - patient not interested at this time -Current therapy:  vitamin c 500 mg daily -Educated on Cost vs benefit of each product must be carefully weighed by individual consumer -Patient is satisfied with current therapy and denies issues -Recommended to continue current medication    Patient Goals/Self-Care Activities Patient will:  - take medications as prescribed focus on medication adherence by considering a pill box.  check blood pressure weekly, document, and provide at future appointments engage in dietary modifications by limiting salt and focusing on heart-healthy diet.   Follow Up Plan: Telephone follow up appointment with care management team member scheduled for: Pharmacist 03/2021 to follow-up on smoking cessation       The patient verbalized understanding of instructions, educational materials, and care plan provided today and declined offer to receive copy of patient instructions, educational materials, and care plan.  The pharmacy team will reach out to the patient again over the next 30 days.   Zettie Pho, [facility]

## 2021-03-09 ENCOUNTER — Other Ambulatory Visit (HOSPITAL_BASED_OUTPATIENT_CLINIC_OR_DEPARTMENT_OTHER): Payer: Self-pay | Admitting: Neurology

## 2021-03-09 ENCOUNTER — Telehealth (INDEPENDENT_AMBULATORY_CARE_PROVIDER_SITE_OTHER): Payer: Medicare Other | Admitting: Nurse Practitioner

## 2021-03-09 ENCOUNTER — Other Ambulatory Visit (INDEPENDENT_AMBULATORY_CARE_PROVIDER_SITE_OTHER): Payer: Self-pay | Admitting: Family Practice

## 2021-03-09 ENCOUNTER — Other Ambulatory Visit: Payer: Self-pay

## 2021-03-09 ENCOUNTER — Telehealth (INDEPENDENT_AMBULATORY_CARE_PROVIDER_SITE_OTHER): Payer: Self-pay | Admitting: Family Practice

## 2021-03-09 DIAGNOSIS — M797 Fibromyalgia: Secondary | ICD-10-CM

## 2021-03-09 DIAGNOSIS — F339 Major depressive disorder, recurrent, unspecified: Secondary | ICD-10-CM

## 2021-03-09 DIAGNOSIS — F419 Anxiety disorder, unspecified: Secondary | ICD-10-CM

## 2021-03-09 DIAGNOSIS — F4310 Post-traumatic stress disorder, unspecified: Secondary | ICD-10-CM

## 2021-03-09 DIAGNOSIS — F32A Depression, unspecified: Secondary | ICD-10-CM

## 2021-03-09 DIAGNOSIS — F17200 Nicotine dependence, unspecified, uncomplicated: Secondary | ICD-10-CM

## 2021-03-09 DIAGNOSIS — E039 Hypothyroidism, unspecified: Secondary | ICD-10-CM

## 2021-03-09 NOTE — Telephone Encounter (Signed)
*  MEDS FLAGGED FOR REMOVAL*      *The requested med passed all protocol parameters in the associated med info guide - Protocol details reviewed by pharmacist.       Hypothyroid Medication Refill Protocol    Last visit in enc specialty: 12/11/2020     Recent Visits in This Encounter Department     Date Provider Department Visit Type Primary Dx    10/01/2020 Edi, Verl Blalock, MD [place] [facility] Chronic right-sided low back pain without sciatica    07/23/2020 Edi, Verl Blalock, MD [place] [facility] Hypothyroidism, unspecified type    06/16/2020 Edi, Verl Blalock, MD [place] [facility] Short of breath on exertion    05/12/2020 Edi, Verl Blalock, MD [place] [facility] Inadequate exercise - not at goal    03/28/2020 Edi, Verl Blalock, MD [place] [facility] Encounter for screening mammogram for malignant neoplasm of breast         Population Health Visits  Recent [facility] Visits    None       Next appt in enc specialty: Visit date not found      Future Appointments 03/09/2021 - 03/08/2026      Date Visit Type Department Provider     04/29/2021 11:30 AM [facility] - Neurology - Leda Quail, Tillman Sers, MD    Appointment Notes:     return visit rescheduled from 02/25/21             06/03/2021 11:40 AM [place] Johny Shears, MD    Appointment Notes:     F/U [facility]                      **If no recent dose changes, and last TSH (and T4 for Liothyronine only) normal/within date, no recent notes will be reviewed per Pharmacy Dept protocol**      LABS required:  (Q year TSH)  *If recent dose change: check TSH 2 months after starting new  dose.    Lab Results   Component Value Date    TSH 0.97 07/24/2020    FREET4 0.85 (L) 07/24/2020         Monitoring  required:  (Q year BP, HR, Wt)   *If pt is pregnant, DEFER TO MD*    (BP range: HQRFXJOI=32-549  IYMEBRAXE=94-07)  Blood Pressure   12/12/20 130/80   10/15/20 111/81   09/26/20 107/71       (HR range: 55-110)   Pulse Readings from Last 3 Encounters:   12/12/20 86   10/15/20 67   09/26/20 67       Wt Readings from Last 3 Encounters:   12/12/20 65 kg (143 lb 4.8 oz)   10/15/20 63.8 kg (140 lb 9.6 oz)   05/21/20 65.4 kg (144 lb 3 oz)         Last Digital Health Monitoring Vitals:        Last MyChart BP Values:        Last Pt Entered MyChart BP Values:

## 2021-03-10 DIAGNOSIS — F419 Anxiety disorder, unspecified: Secondary | ICD-10-CM | POA: Insufficient documentation

## 2021-03-10 MED ORDER — PRAZOSIN HCL 1 MG OR CAPS
1.0000 mg | ORAL_CAPSULE | Freq: Every evening | ORAL | 0 refills | Status: AC
Start: 2021-03-10 — End: ?

## 2021-03-10 MED ORDER — DIAZEPAM 5 MG OR TABS
5.0000 mg | ORAL_TABLET | Freq: Every day | ORAL | 0 refills | Status: AC | PRN
Start: 2021-03-10 — End: ?

## 2021-03-10 MED ORDER — THYROID 15 MG OR TABS
15.0000 mg | ORAL_TABLET | ORAL | 1 refills | Status: DC
Start: 2021-03-10 — End: 2021-09-15

## 2021-03-10 MED ORDER — THYROID 60 MG OR TABS
60.0000 mg | ORAL_TABLET | Freq: Every day | ORAL | 1 refills | Status: DC
Start: 2021-03-10 — End: 2021-09-15

## 2021-03-10 MED ORDER — BUPROPION XL (DAILY) 300 MG OR TB24
300.0000 mg | ORAL_TABLET | Freq: Every morning | ORAL | 0 refills | Status: DC
Start: 2021-03-10 — End: 2021-06-25

## 2021-03-10 NOTE — Telephone Encounter (Signed)
Per Medication Reconciliation Committee protocol:   3 meds previously flagged for removal were d/c'd by the pharmacist- atropine oph, ofloxacin oph, and pred forte oph

## 2021-03-10 NOTE — Telephone Encounter (Signed)
Called patient left a message to contact Dr Edi to schedule a follow up medication review, please inform patient Dr Edi is out on Maternity leave and schedule with any available provider.
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
## 2021-03-10 NOTE — Telephone Encounter (Signed)
Patient is requesting refill of medication, please see orders for preloaded prescription refill of nortriptyline (PAMELOR) 25 MG capsule     Last visit with this physician:    Visit date not found  Next appointment with this physician:   Visit date not found    Request has been routed to provider

## 2021-03-10 NOTE — Telephone Encounter (Signed)
Scheduling Request:   [facility]     Please remind pt to schedule f/u appt w Dr Edi, Rina S, for 3 month f/u for back pain, MS, and chronic issues (video visit if appropriate), was due on or after 01/01/21.           Thanks,    [facility]   Refill and Prior [employer]  Phone:  [phone]  Ext:  [phone]

## 2021-03-11 ENCOUNTER — Other Ambulatory Visit (INDEPENDENT_AMBULATORY_CARE_PROVIDER_SITE_OTHER): Payer: Self-pay | Admitting: Nurse Practitioner

## 2021-03-11 DIAGNOSIS — F515 Nightmare disorder: Secondary | ICD-10-CM

## 2021-03-11 MED ORDER — NORTRIPTYLINE HCL 25 MG OR CAPS
50.0000 mg | ORAL_CAPSULE | Freq: Every evening | ORAL | 3 refills | Status: DC
Start: 2021-03-11 — End: 2021-04-29

## 2021-03-11 NOTE — Telephone Encounter (Addendum)
From: [name]  To: Office of Areta Haber, NP  Sent: 03/09/2021 10:03 AM PST  Subject: Medication Renewal Request    Refills have been requested for the following medications:        tiZANidine (ZANAFLEX) 4 MG tablet [[facility] Anthony [facility]]    Last visit: 03/09/2021  Next visit:0    Preferred pharmacy: [id number] - [place], [place]  Delivery method: Pickup    Thank you,     Brand Males, MA

## 2021-03-11 NOTE — Telephone Encounter (Signed)
Patient requesting medication refill of: (PAMELOR) nortriptyline 25 mg    Last refill: 04/02/2020, with 3 refills    Last visit in this department 04/02/2020  Next visit in this department Visit date not found     Routed to MD/NP team as Rx request

## 2021-03-13 NOTE — Telephone Encounter (Signed)
Patient is tapering Tizanidine in order to start Prazosin for PTSD nightmares. We discussed that she would taper for 2 weeks and then stop and does not need a refill. Will decline refill request.

## 2021-03-16 ENCOUNTER — Encounter (HOSPITAL_BASED_OUTPATIENT_CLINIC_OR_DEPARTMENT_OTHER): Payer: Self-pay | Admitting: Hospital

## 2021-03-16 ENCOUNTER — Telehealth (INDEPENDENT_AMBULATORY_CARE_PROVIDER_SITE_OTHER): Payer: Medicare Other | Admitting: Internal Medicine

## 2021-03-18 ENCOUNTER — Telehealth (INDEPENDENT_AMBULATORY_CARE_PROVIDER_SITE_OTHER): Payer: Self-pay | Admitting: Family Practice

## 2021-03-18 DIAGNOSIS — E782 Mixed hyperlipidemia: Secondary | ICD-10-CM

## 2021-03-18 DIAGNOSIS — I10 Essential (primary) hypertension: Secondary | ICD-10-CM | POA: Diagnosis not present

## 2021-03-18 NOTE — Telephone Encounter (Signed)
Covering Dr Aaron Edelman inbox  Reviewed, signed

## 2021-03-18 NOTE — Telephone Encounter (Signed)
Signed form faxed to [facility]. Received confirmation fax was sent successfully.

## 2021-03-18 NOTE — Telephone Encounter (Signed)
Initial Examination received from [facility]. Form placed in Dr. Aaron Edelman inbox to review and sign.

## 2021-03-24 DIAGNOSIS — G894 Chronic pain syndrome: Secondary | ICD-10-CM | POA: Diagnosis not present

## 2021-03-24 DIAGNOSIS — M461 Sacroiliitis, not elsewhere classified: Secondary | ICD-10-CM | POA: Diagnosis not present

## 2021-03-24 DIAGNOSIS — M47816 Spondylosis without myelopathy or radiculopathy, lumbar region: Secondary | ICD-10-CM | POA: Diagnosis not present

## 2021-03-24 DIAGNOSIS — M5090 Cervical disc disorder, unspecified, unspecified cervical region: Secondary | ICD-10-CM | POA: Diagnosis not present

## 2021-03-24 DIAGNOSIS — M546 Pain in thoracic spine: Secondary | ICD-10-CM | POA: Diagnosis not present

## 2021-03-24 DIAGNOSIS — M48061 Spinal stenosis, lumbar region without neurogenic claudication: Secondary | ICD-10-CM | POA: Diagnosis not present

## 2021-03-24 DIAGNOSIS — M5136 Other intervertebral disc degeneration, lumbar region: Secondary | ICD-10-CM | POA: Diagnosis not present

## 2021-03-27 ENCOUNTER — Telehealth: Payer: Self-pay

## 2021-03-27 NOTE — Chronic Care Management (AMB) (Signed)
    Chronic Care Management Pharmacy Assistant   Name: Dorothy Parker  MRN: [id number] DOB: [birth year]   Reason for Encounter: General Adherence Call     Recent office visits:  None   Recent consult visits:  None   Hospital visits:  None   Medications: Outpatient Encounter Medications as of 03/27/2021  Medication Sig   alendronate (FOSAMAX) 70 MG tablet Take 1 tablet (70 mg total) by mouth every 7 (seven) days. Take with a full glass of water on an empty stomach.   ascorbic acid (VITAMIN C) 500 MG tablet Take by mouth.   aspirin 81 MG EC tablet    glycopyrrolate (ROBINUL) 1 MG tablet TAKE TWO TABLETS BY MOUTH TWICE DAILY   metoprolol tartrate (LOPRESSOR) 25 MG tablet TAKE 1 TABLET BY MOUTH TWICE A DAY   naloxone (NARCAN) 4 MG/0.1ML LIQD nasal spray kit USE 1 SPRAY IN ONE NOSTRIL. MAY REPEAT IN OTHER NOSTRIL AFTER 2-3 MINS IF NEEDED   Nicotine (NICOTROL NS) 10 MG/ML SOLN 2 puff into each nostril as needed up to 5 times per hour. Maximum 40 doses per day.   nitroGLYCERIN (NITROLINGUAL) 0.4 MG/SPRAY spray Place under the tongue.   oxyCODONE-acetaminophen (PERCOCET) 7.5-325 MG tablet Take 1 tablet by mouth 4 (four) times daily as needed.   quinapril (ACCUPRIL) 10 MG tablet TAKE 1 TABLET BY MOUTH DAILY   rosuvastatin (CRESTOR) 5 MG tablet TAKE 1 TABLET BY MOUTH DAILY.   STIOLTO RESPIMAT 2.5-2.5 MCG/ACT AERS INHALE 1 PUFF INTO THE LUNGS IN THE MORNING AND AT BEDTIME   triamcinolone cream (KENALOG) 0.1 % APPLY A THIN LAYER TO THE AFFECTED AREA(S) TWICE A DAY.   ZTLIDO 1.8 % PTCH    No facility-administered encounter medications on file as of 03/27/2021.   Contacted Dorothy Parker for general disease state and medication adherence call.   Patient is not > 5 days past due for refill on the following medications per chart history:  [facility] Medications: Medication Name/mg Last Fill Days Supply Quinapril 10 mg  02/10/21 90ds     11/13/20 90ds Rosuvastatin 5  mg  01/02/21 90ds     10/08/20 90ds   What concerns do you have about your medications? Pt has no concerns   The patient denies side effects with her medications.   How often do you forget or accidentally miss a dose? Never  Do you use a pillbox? No  Are you having any problems getting your medications from your pharmacy? No  Has the cost of your medications been a concern? No  Since last visit with CPP, no interventions have been made:   The patient has not had an ED visit since last contact.   The patient denies problems with their health.    Patient states BP and BG readings are as follows  Pt stated she normally checks her BP but has been slacking on checking it lately and had no readings available     Care Gaps: Last annual wellness visit: 12/18/20 Colonoscopy: postponed 12/18/21 Dexa Scan : 08/12/20 Mammogram: 01/12/21   Dorothy Parker, [place] Clinical Pharmacist Assistant  [phone]

## 2021-03-30 DIAGNOSIS — M47816 Spondylosis without myelopathy or radiculopathy, lumbar region: Secondary | ICD-10-CM | POA: Diagnosis not present

## 2021-03-30 DIAGNOSIS — G894 Chronic pain syndrome: Secondary | ICD-10-CM | POA: Diagnosis not present

## 2021-03-30 DIAGNOSIS — Z79891 Long term (current) use of opiate analgesic: Secondary | ICD-10-CM | POA: Diagnosis not present

## 2021-03-30 DIAGNOSIS — Z1389 Encounter for screening for other disorder: Secondary | ICD-10-CM | POA: Diagnosis not present

## 2021-03-30 DIAGNOSIS — M5136 Other intervertebral disc degeneration, lumbar region: Secondary | ICD-10-CM | POA: Diagnosis not present

## 2021-03-30 DIAGNOSIS — M461 Sacroiliitis, not elsewhere classified: Secondary | ICD-10-CM | POA: Diagnosis not present

## 2021-03-30 DIAGNOSIS — M48061 Spinal stenosis, lumbar region without neurogenic claudication: Secondary | ICD-10-CM | POA: Diagnosis not present

## 2021-04-01 ENCOUNTER — Ambulatory Visit (INDEPENDENT_AMBULATORY_CARE_PROVIDER_SITE_OTHER): Payer: Medicare HMO | Admitting: Legal Medicine

## 2021-04-01 ENCOUNTER — Encounter: Payer: Self-pay | Admitting: Legal Medicine

## 2021-04-01 ENCOUNTER — Other Ambulatory Visit: Payer: Self-pay

## 2021-04-01 VITALS — BP 124/64 | HR 78 | Temp 98.1°F | Resp 18 | Ht 64.0 in | Wt 101.0 lb

## 2021-04-01 DIAGNOSIS — I10 Essential (primary) hypertension: Secondary | ICD-10-CM | POA: Diagnosis not present

## 2021-04-01 DIAGNOSIS — F321 Major depressive disorder, single episode, moderate: Secondary | ICD-10-CM | POA: Diagnosis not present

## 2021-04-01 DIAGNOSIS — I6523 Occlusion and stenosis of bilateral carotid arteries: Secondary | ICD-10-CM | POA: Diagnosis not present

## 2021-04-01 DIAGNOSIS — I2510 Atherosclerotic heart disease of native coronary artery without angina pectoris: Secondary | ICD-10-CM | POA: Diagnosis not present

## 2021-04-01 DIAGNOSIS — M810 Age-related osteoporosis without current pathological fracture: Secondary | ICD-10-CM

## 2021-04-01 DIAGNOSIS — E782 Mixed hyperlipidemia: Secondary | ICD-10-CM

## 2021-04-01 DIAGNOSIS — Z681 Body mass index (BMI) 19 or less, adult: Secondary | ICD-10-CM | POA: Insufficient documentation

## 2021-04-01 DIAGNOSIS — J449 Chronic obstructive pulmonary disease, unspecified: Secondary | ICD-10-CM

## 2021-04-01 DIAGNOSIS — E440 Moderate protein-calorie malnutrition: Secondary | ICD-10-CM | POA: Insufficient documentation

## 2021-04-01 MED ORDER — ALENDRONATE SODIUM 70 MG PO TABS: 70.0000 mg | ORAL_TABLET | ORAL | 2 refills | Status: DC
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
## 2021-04-03 ENCOUNTER — Other Ambulatory Visit: Payer: Self-pay | Admitting: Legal Medicine

## 2021-04-03 DIAGNOSIS — M546 Pain in thoracic spine: Secondary | ICD-10-CM | POA: Diagnosis not present

## 2021-04-03 DIAGNOSIS — M5090 Cervical disc disorder, unspecified, unspecified cervical region: Secondary | ICD-10-CM | POA: Diagnosis not present

## 2021-04-03 DIAGNOSIS — M47816 Spondylosis without myelopathy or radiculopathy, lumbar region: Secondary | ICD-10-CM | POA: Diagnosis not present

## 2021-04-03 DIAGNOSIS — M5136 Other intervertebral disc degeneration, lumbar region: Secondary | ICD-10-CM | POA: Diagnosis not present

## 2021-04-03 DIAGNOSIS — M48061 Spinal stenosis, lumbar region without neurogenic claudication: Secondary | ICD-10-CM | POA: Diagnosis not present

## 2021-04-03 DIAGNOSIS — E782 Mixed hyperlipidemia: Secondary | ICD-10-CM

## 2021-04-03 DIAGNOSIS — G894 Chronic pain syndrome: Secondary | ICD-10-CM | POA: Diagnosis not present

## 2021-04-03 DIAGNOSIS — M461 Sacroiliitis, not elsewhere classified: Secondary | ICD-10-CM | POA: Diagnosis not present

## 2021-04-06 ENCOUNTER — Other Ambulatory Visit (HOSPITAL_BASED_OUTPATIENT_CLINIC_OR_DEPARTMENT_OTHER): Payer: Self-pay | Admitting: Neurology

## 2021-04-06 DIAGNOSIS — F339 Major depressive disorder, recurrent, unspecified: Secondary | ICD-10-CM

## 2021-04-06 DIAGNOSIS — G35 Multiple sclerosis: Secondary | ICD-10-CM

## 2021-04-06 DIAGNOSIS — G894 Chronic pain syndrome: Secondary | ICD-10-CM

## 2021-04-08 ENCOUNTER — Other Ambulatory Visit: Payer: Medicare HMO

## 2021-04-08 ENCOUNTER — Other Ambulatory Visit: Payer: Self-pay | Admitting: Legal Medicine

## 2021-04-08 ENCOUNTER — Other Ambulatory Visit: Payer: Self-pay

## 2021-04-08 DIAGNOSIS — F321 Major depressive disorder, single episode, moderate: Secondary | ICD-10-CM | POA: Diagnosis not present

## 2021-04-08 DIAGNOSIS — I10 Essential (primary) hypertension: Secondary | ICD-10-CM | POA: Diagnosis not present

## 2021-04-08 DIAGNOSIS — E782 Mixed hyperlipidemia: Secondary | ICD-10-CM | POA: Diagnosis not present

## 2021-04-09 LAB — CBC WITH DIFFERENTIAL/PLATELET
Basophils Absolute: 0 10*3/uL (ref 0.0–0.2)
Basos: 1 %
EOS (ABSOLUTE): 0.1 10*3/uL (ref 0.0–0.4)
Eos: 1 %
Hematocrit: 43.4 % (ref 34.0–46.6)
Hemoglobin: 14.6 g/dL (ref 11.1–15.9)
Immature Grans (Abs): 0 10*3/uL (ref 0.0–0.1)
Immature Granulocytes: 0 %
Lymphocytes Absolute: 2.2 10*3/uL (ref 0.7–3.1)
Lymphs: 27 %
MCH: 32.2 pg (ref 26.6–33.0)
MCHC: 33.6 g/dL (ref 31.5–35.7)
MCV: 96 fL (ref 79–97)
Monocytes Absolute: 0.5 10*3/uL (ref 0.1–0.9)
Monocytes: 6 %
Neutrophils Absolute: 5.3 10*3/uL (ref 1.4–7.0)
Neutrophils: 65 %
Platelets: 220 10*3/uL (ref 150–450)
RBC: 4.53 x10E6/uL (ref 3.77–5.28)
RDW: 12 % (ref 11.7–15.4)
WBC: 8.1 10*3/uL (ref 3.4–10.8)

## 2021-04-09 LAB — LIPID PANEL
Chol/HDL Ratio: 2.3 ratio (ref 0.0–4.4)
Cholesterol, Total: 133 mg/dL (ref 100–199)
HDL: 57 mg/dL (ref >39)
LDL Chol Calc (NIH): 57 mg/dL (ref 0–99)
Triglycerides: 104 mg/dL (ref 0–149)
VLDL Cholesterol Cal: 19 mg/dL (ref 5–40)

## 2021-04-09 LAB — COMPREHENSIVE METABOLIC PANEL
ALT: 12 IU/L (ref 0–32)
AST: 18 IU/L (ref 0–40)
Albumin/Globulin Ratio: 2.3 — ABNORMAL HIGH (ref 1.2–2.2)
Albumin: 4.8 g/dL (ref 3.8–4.8)
Alkaline Phosphatase: 74 IU/L (ref 44–121)
BUN/Creatinine Ratio: 20 (ref 12–28)
BUN: 14 mg/dL (ref 8–27)
Bilirubin Total: 0.5 mg/dL (ref 0.0–1.2)
CO2: 24 mmol/L (ref 20–29)
Calcium: 9.2 mg/dL (ref 8.7–10.3)
Chloride: 102 mmol/L (ref 96–106)
Creatinine, Ser: 0.7 mg/dL (ref 0.57–1.00)
Globulin, Total: 2.1 g/dL (ref 1.5–4.5)
Glucose: 84 mg/dL (ref 70–99)
Potassium: 4.4 mmol/L (ref 3.5–5.2)
Sodium: 140 mmol/L (ref 134–144)
Total Protein: 6.9 g/dL (ref 6.0–8.5)
eGFR: 98 mL/min/{1.73_m2} (ref >59)

## 2021-04-09 LAB — CARDIOVASCULAR RISK ASSESSMENT

## 2021-04-09 LAB — TSH: TSH: 0.378 u[IU]/mL — ABNORMAL LOW (ref 0.450–4.500)

## 2021-04-09 NOTE — Progress Notes (Signed)
Kidney and liver tests normal, cholesterol normal, cbc normal, TSH 0.378 low we will watch,  lp

## 2021-04-17 ENCOUNTER — Other Ambulatory Visit: Payer: Self-pay | Admitting: Legal Medicine

## 2021-04-17 DIAGNOSIS — E782 Mixed hyperlipidemia: Secondary | ICD-10-CM

## 2021-04-22 ENCOUNTER — Other Ambulatory Visit: Payer: Self-pay

## 2021-04-22 ENCOUNTER — Encounter (INDEPENDENT_AMBULATORY_CARE_PROVIDER_SITE_OTHER): Payer: Self-pay | Admitting: Internal Medicine

## 2021-04-22 ENCOUNTER — Other Ambulatory Visit: Payer: Self-pay | Admitting: Legal Medicine

## 2021-04-22 DIAGNOSIS — J449 Chronic obstructive pulmonary disease, unspecified: Secondary | ICD-10-CM

## 2021-04-22 NOTE — Telephone Encounter (Signed)
From: [name]  To: Johny Shears, MD  Sent: 04/22/2021 1:18 PM PST  Subject: lab tests    Dear Dr. Candiss Norse,     I will be in [facility] for a Doctor appt on Jan. 11th with my neurologist. We have an appt. on February 15th. I am not sure if you need any labs done, or if I did labs too soon. Let me know if you need anything so I can do the labs in [place].   Thanks,   [name]

## 2021-04-24 DIAGNOSIS — G894 Chronic pain syndrome: Secondary | ICD-10-CM | POA: Diagnosis not present

## 2021-04-24 DIAGNOSIS — M546 Pain in thoracic spine: Secondary | ICD-10-CM | POA: Diagnosis not present

## 2021-04-24 DIAGNOSIS — M47816 Spondylosis without myelopathy or radiculopathy, lumbar region: Secondary | ICD-10-CM | POA: Diagnosis not present

## 2021-04-24 DIAGNOSIS — M461 Sacroiliitis, not elsewhere classified: Secondary | ICD-10-CM | POA: Diagnosis not present

## 2021-04-24 DIAGNOSIS — M48061 Spinal stenosis, lumbar region without neurogenic claudication: Secondary | ICD-10-CM | POA: Diagnosis not present

## 2021-04-24 DIAGNOSIS — M5136 Other intervertebral disc degeneration, lumbar region: Secondary | ICD-10-CM | POA: Diagnosis not present

## 2021-04-24 DIAGNOSIS — M5090 Cervical disc disorder, unspecified, unspecified cervical region: Secondary | ICD-10-CM | POA: Diagnosis not present

## 2021-04-27 ENCOUNTER — Other Ambulatory Visit (HOSPITAL_BASED_OUTPATIENT_CLINIC_OR_DEPARTMENT_OTHER): Payer: Self-pay | Admitting: Neurology

## 2021-04-27 DIAGNOSIS — F339 Major depressive disorder, recurrent, unspecified: Secondary | ICD-10-CM

## 2021-04-27 DIAGNOSIS — G35 Multiple sclerosis: Secondary | ICD-10-CM

## 2021-04-27 DIAGNOSIS — G894 Chronic pain syndrome: Secondary | ICD-10-CM

## 2021-04-29 ENCOUNTER — Encounter (INDEPENDENT_AMBULATORY_CARE_PROVIDER_SITE_OTHER): Payer: Self-pay | Admitting: Neurology

## 2021-04-29 ENCOUNTER — Ambulatory Visit (INDEPENDENT_AMBULATORY_CARE_PROVIDER_SITE_OTHER): Payer: Medicare Other | Admitting: Neurology

## 2021-04-29 ENCOUNTER — Other Ambulatory Visit (INDEPENDENT_AMBULATORY_CARE_PROVIDER_SITE_OTHER): Payer: Medicare Other | Attending: Internal Medicine

## 2021-04-29 VITALS — BP 123/78 | HR 78 | Temp 96.7°F | Ht 64.0 in | Wt 139.0 lb

## 2021-04-29 DIAGNOSIS — G35 Multiple sclerosis: Secondary | ICD-10-CM

## 2021-04-29 DIAGNOSIS — G894 Chronic pain syndrome: Secondary | ICD-10-CM

## 2021-04-29 DIAGNOSIS — F339 Major depressive disorder, recurrent, unspecified: Secondary | ICD-10-CM

## 2021-04-29 DIAGNOSIS — F515 Nightmare disorder: Secondary | ICD-10-CM

## 2021-04-29 DIAGNOSIS — M797 Fibromyalgia: Secondary | ICD-10-CM

## 2021-04-29 DIAGNOSIS — D8989 Other specified disorders involving the immune mechanism, not elsewhere classified: Secondary | ICD-10-CM | POA: Insufficient documentation

## 2021-04-29 LAB — CBC WITH DIFF, BLOOD
ANC-Automated: 3.3 10*3/uL (ref 1.6–7.0)
Abs Basophils: 0 10*3/uL (ref <0.2)
Abs Eosinophils: 0.2 10*3/uL (ref 0.0–0.5)
Abs Lymphs: 2 10*3/uL (ref 0.8–3.1)
Abs Monos: 0.5 10*3/uL (ref 0.2–0.8)
Basophils: 1 %
Eosinophils: 3 %
Hct: 39.8 % (ref 34.0–45.0)
Hgb: 12.6 gm/dL (ref 11.2–15.7)
Lymphocytes: 33 %
MCH: 28.9 pg (ref 26.0–32.0)
MCHC: 31.7 g/dL — ABNORMAL LOW (ref 32.0–36.0)
MCV: 91.3 um3 (ref 79.0–95.0)
MPV: 9.7 fL (ref 9.4–12.4)
Monocytes: 9 %
Plt Count: 277 10*3/uL (ref 140–370)
RBC: 4.36 10*6/uL (ref 3.90–5.20)
RDW: 12 % (ref 12.0–14.0)
Segs: 55 %
WBC: 6 10*3/uL (ref 4.0–10.0)

## 2021-04-29 LAB — COMPREHENSIVE METABOLIC PANEL, BLOOD
ALT (SGPT): 18 U/L (ref 0–33)
AST (SGOT): 25 U/L (ref 0–32)
Albumin: 4.5 g/dL (ref 3.5–5.2)
Alkaline Phos: 67 U/L (ref 40–130)
Anion Gap: 12 mmol/L (ref 7–15)
BUN: 14 mg/dL (ref 8–23)
Bicarbonate: 26 mmol/L (ref 22–29)
Bilirubin, Tot: 0.53 mg/dL (ref <1.2)
Calcium: 10 mg/dL (ref 8.5–10.6)
Chloride: 101 mmol/L (ref 98–107)
Creatinine: 0.83 mg/dL (ref 0.51–0.95)
Glucose: 88 mg/dL (ref 70–99)
Potassium: 4.2 mmol/L (ref 3.5–5.1)
Sodium: 139 mmol/L (ref 136–145)
Total Protein: 7.4 g/dL (ref 6.0–8.0)
eGFR Based on CKD-EPI 2021 Equation: >60 mL/min

## 2021-04-29 LAB — SED RATE, BLOOD: Sed Rate: 14 mm/hr (ref 0–30)

## 2021-04-29 LAB — IGG, BLOOD: IGG: 949 mg/dL (ref 700–1600)

## 2021-04-29 LAB — C-REACTIVE PROTEIN, BLOOD: CRP: <0.3 mg/dL (ref <0.5)

## 2021-04-29 MED ORDER — TIZANIDINE HCL 4 MG OR TABS
4.0000 mg | ORAL_TABLET | Freq: Every evening | ORAL | 3 refills | Status: AC
Start: 2021-04-29 — End: ?

## 2021-04-29 MED ORDER — BACLOFEN 10 MG OR TABS
10.0000 mg | ORAL_TABLET | Freq: Every evening | ORAL | 3 refills | Status: AC
Start: 2021-04-29 — End: ?

## 2021-04-29 MED ORDER — NORTRIPTYLINE HCL 25 MG OR CAPS
50.0000 mg | ORAL_CAPSULE | Freq: Every evening | ORAL | 3 refills | Status: AC
Start: 2021-04-29 — End: ?

## 2021-04-29 MED ORDER — DULOXETINE HCL 60 MG OR CPEP
120.0000 mg | ORAL_CAPSULE | Freq: Every day | ORAL | 2 refills | Status: AC
Start: 2021-04-29 — End: ?

## 2021-04-29 MED ORDER — GABAPENTIN 300 MG OR CAPS
600.0000 mg | ORAL_CAPSULE | Freq: Every evening | ORAL | 3 refills | Status: AC
Start: 2021-04-29 — End: ?

## 2021-04-29 NOTE — Patient Instructions (Addendum)
Trial of THC +/- CBD for insomnia. Go to [facility]  Use lowest dose of prazosin and tizanidine needed  Schedule follow-up in 6 months    Baclofen, nortriptyline and gabapentin are the medications you take most commonly associated with brain fog

## 2021-04-29 NOTE — Progress Notes (Signed)
[facility]    First visit: 12/09/2015  Last visit:04/02/2020  Current visit 04/29/2021:     Principle Neurological Diagnosis:   1. Clinically definite Multiple Sclerosis, relapsing form  2. Anterior uveitis (monitored by Dr Rolland Porter)  3. 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 Vestibular neuronitis    Identifying information: Debra Bolton is a 61 year old woman now living in [place] after living in [place] for the past 2 years. She is originally from [place], seen for 27 years by Dr. Lavon Paganini at [facility]. 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  She recently completed yoga teaching training. We reviewed the history and problem list in detail today which is documented in the following sections.   ______________________________________________________________________    MS History      Initial Symptoms (Date and Description): 1987 (~age 70), after delivery of first daughter, both legs would fall asleep during exercise  1988 grand mal seizure in doctor office  1989 another grand mal seizure in [place] while on vacation, was placed on tegretol for 2 years  1991 diagnosed with MS as she had more frequent episodes of fatigue, depression and numbness     Progressive Disease (approx onset and description): see above    Cumulative [place] Visits (since 05/26/2018)    -Visit 05/26/2018: Reports 2 falls recently feels she is tripping and catching her feet (R>L); concerned that her balance is more off lately. Feels her legs are "jittery" or have subtle tremors/shakes; sees black spots in the periphery of R eye constantly; She tolerated the RItuximab well (last infusion on 01/16/2018); Initially noticed improvement in hoarse voice (related to swollen cervical LNs from IgG4 syndrome) but now returning;Dr Truman Hayward felt uveitis OD better on 09/09/17 but she feels inflammation  is returning. Dr Truman Hayward is waiting several months to make sure uveitis resolved before doing cataract surgery; Continues to have mouth ulcers continue to occur intermittently (sore raised white spot without bleeding but painful).     -Telephone visit 11/24/2018: Acute onset severe vertigo 10/05/2018 when she got up to go the bathroom. Persisted with nausea and ataxia ; bedridden for 2 days before vertigo became intermittent and positional; still occurring but 80 % improved. MRI brain 10/19/2018 with no new enlarging or active lesions. Brainstem looks normal  Reports amantadine helps fatigue but takes prn because she get too jittery with regular usage  Discussed risks of rituximab with COVID 19. Somewhat of a moot point since she received an infusion last on 09/18/2018. Traveling by car to [place] for daughter's small outdoor wedding in September. Discussed precautions    -06/01/2019 telephone follow-up: In the last year she has fallen 5 times including a recent fall: She went to the bathroom and fell forward reaching to the toilet paper. L head purple, CT Head / Face without fractures or ICH. She has noticed an association with alcohol use and will try to avoid this. 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 Recent UTI and endorses urinary hesitancy but not interested in starting medication yet.       -04/02/2020 in person follow up: Covid19 pneumonia in August, 2021 withy residual worsening fatigue . 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 Feels shakier. Cognitive issues, maintaining attention and completing complicated tasks. Reading is harder. Has been walking 3-5  miles per day but has not been able to run/ do yoga. Tired by 5pm. Intermittent slurred speech. Has had extreme thirst. Gained 10 pounds. No falls in the past 6 months. Feels slow decline with regards to balance and energy.  Swallowing is a little more difficult, gags more than used to. Getting cataract surgery  in January. Moved from [place] to [place], may want to transfer care to neurologist in [place].    -04/29/2021 visit [facility]): Highly symptomatic this time of year but no new symptoms (increased fatigue,brain fog and discomfort primarily); partly related to spending several months in [place] helping care for new grandchild, getting with grandchild at night so parents could sleep.       Disease Modifying Treatment History: [Documented Needle phobia: No]    1. Betaseron (~ 1 year on therapy)  2. Copaxone ( <1 year) discontinued because it worsened depression  3. Tysabri (~1884 - Sep 11, 2015)  4. Rituximab 500 mg X 2 doses (07/01/17 & 07/19/17), 1000 mg X 2 (01/02/18 & 01/16/18), 1000 mg X 1 (09/19/2018, 06/10/2020, 12/12/2020)    ______________________________________________________________________    Review of Systems: Complete 10 point review of symptoms from the patient self report questionnaire was reviewed with the patient today and scanned into the media tab. Any pertinent positives are listed above in the narrative history or problem list.    Allergies   Allergen Reactions   . Penicillins Rash   . Modafinil Diarrhea and Nausea and Vomiting       Current Outpatient Medications   Medication Sig   . albuterol (VENTOLIN HFA) 108 (90 Base) MCG/ACT inhaler Inhale 2 puffs by mouth every 6 hours as needed for Wheezing.   . Amantadine HCl 100 MG tablet Take 100 mg by mouth 2 times daily. Takes PRN   . baclofen (LIORESAL) 10 MG tablet TAKE 1 TABLET BY MOUTH EVERY DAY NIGHTLY   . buPROPion (WELLBUTRIN) 300 MG Extended-Release tablet Take 1 tablet (300 mg) by mouth every morning.   . diazepam (VALIUM) 5 MG tablet Take 1 tablet (5 mg) by mouth daily as needed for Anxiety.   . DULoxetine (CYMBALTA) 60 MG CR capsule Take 2 capsules (120 mg) by mouth daily.   Marland Kitchen gabapentin (NEURONTIN) 300 MG capsule Take 2 capsules (600 mg) by mouth nightly.   . nortriptyline (PAMELOR) 25 MG capsule Take 2 capsules (50 mg) by mouth nightly.   .  prazosin (MINIPRESS) 1 MG capsule Take 1 capsule (1 mg) by mouth nightly.   Marland Kitchen RITUXIMAB IV Every 6 months.   . thyroid (ARMOUR THYROID) 15 MG tablet Take 1 tablet (15 mg) by mouth every other day.   . thyroid (ARMOUR THYROID) 60 MG tablet Take 1 tablet (60 mg) by mouth daily.   Marland Kitchen tiZANidine (ZANAFLEX) 4 MG tablet Take 1 tablet (4 mg) by mouth at bedtime.     No current facility-administered medications for this visit.       Past Medical History:   Diagnosis Date   . Asthma 1991    with pregnancy   . Hypothyroidism    . Major depressive disorder, single episode    . MS (multiple sclerosis) (CMS-HCC)      Past Surgical History:   Procedure Laterality Date   . leep      for cervical dysplasia   . LYMPH NODE BIOPSY      IgG4 related disease   . TONSILLECTOMY AND ADENOIDECTOMY      only tonsillectomy  Family Medical History:  Family History   Problem Relation Name Age of Onset   . Other Mother Zara Chess    . Thyroid Mother Zara Chess    . Heart Disease Mother Zara Chess         heart failure   . Cancer Father don mc clain         lung   . Cancer Brother tom mcclain    . Heart Disease Brother tom mcclain         heart attack   . Cancer Sister Zara Chess         breast   . Diabetes Sister suzanne mcclain    . Thyroid Sister suzanne mcclain         car accident   . Stroke Brother [place]    . Cancer Brother Jeremy Johann         prostate cancer   . Cancer Brother john mc clain         copd lungs   . Glaucoma Neg Hx     . Macular Degeneration (AMD) Neg Hx       ______________________________________________________________________    Examination:   BP 123/78 (BP Location: Left arm, BP Patient Position: Sitting, BP cuff size: Regular)   Pulse 78   Temp 96.7 F (35.9 C) (Temporal)   Ht 5' 4"  (1.626 m)   Wt 63 kg (139 lb)   SpO2 99%   BMI 23.86 kg/m   General: Examination of the skin, joints and extremities revealed no abnormalities  ;  There were no cervical, ocular or cranial bruits.      Cognitive/Behavioral: Examination of cognition, language and prosody revealed no abnormalities . Behavioral and affect was appropriate.    Cranial Nerves: Near visual acuity was 20/20-1 with corrective lenses (Previously J10 OD and J2 OS with PH).Visual fields were full  to confrontation testing. Lids were normal.  Pupils were 24m  and reactive with no  RAPD . Fundoscopic exam revealed no definite optic nerve pallor. Eyes were orthotropic with bilateral rotary gaze evoked nystagmus (extorting) and questionable screw in right lateral gaze.  Pursuit and saccadic eye movements revealed few beats of lateral nystagmus in both directions. Facial movements revealed no abnormalities. Testing of facial sensation revealed no abnormalities. Hearing was normal  AD and normal  AS. Bulbar examination revealed no abnormalities .     Motor: Upper extremity strength was as follows (R/L): Shoulder abduction 5/5, Elbow extension 5/5, Wrist extension 5/5, Hand instrinsics 5/5. Rapid alternating movements were graded 1/1+. Spasticity (ashworth) was rated 1/1. Lower extremity strength was as follows (R/L): Hip Flexors 5/5, Knee Flexors 5/5, Ankle dorsiflexors 5/5; Toe tapping was rating 1/1; Spasticity was rated 1+/1  There was no atrophy. There were no fasiculations.    Cerebellar: Finger to nose testing revealed no abnormalities  in the RUE and no abnormalities  in the LUE. Heel to shin testing revealed no abnormalities  in the RLE and no abnormalities  in the LLE. (see gait for description of midline/axial cerebellum dysfunction)    Sensory: Sensation to pp and temperature was normal. Vibration sensation duration (secs) was (R/L): Upper extremity middle finger ND/ND; Lower extremity big toe 5s/7s   Joint position sensation was normal .      Reflexes: (R/L): Biceps 2+/3, BR 2+/3, Triceps 2+/3, Patella 3/3, Ankle 3/3. There was no clonus .     Gait Description: normal gait and station.  Ambulation was performed safely without  assistance. The patient was able to  tandem walk without faltering.   ----------------------------------------------------------------------------------------------------------------------  Performance Measures:  9-Hole PEG Test 04/29/2021 04/02/2020 05/26/2018 06/17/2017   RUE  22 22 19.8 21.5   RUE (Best) 19.1 21.4 17.9 19.5   LUE 24.5 24.7 19.8 22.7   LUE (Best) 23.9 21.8 18.5 19.6       25 Ft. Ambulation Time 04/29/2021 04/02/2020 05/26/2018 06/17/2017   25 ft Ambulation Time - 1st Trial (Seconds) 4.8 4.98 4.5 4.5   Independently or With Assistance? 0 - Independently 0 - Independently Independently Independently   25 ft Ambulation Time - 2nd Trial (Seconds) 4.8 5.4 4.6 4.5   Independently or With Assistance? Independently Independently Independently Independently       No flowsheet data found.    No flowsheet data found.    No flowsheet data found.    No flowsheet data found.    ----------------------------------------------------------------------------------------------------------------------    Review of Imaging Studies:  1. MRI brain 08/10/17 reviewed and compared to prior MRI dated 07/16/16: CLassic MS; New T2=3 (small discrete and all in DWM); relatively low burden. No gad lesions. Mild atrophy for age  49. MRI Brain 10/19/18 reviewed: no radiological evidence of disease activity  3. MRI brain 09/23/2020 reviewed: no new or enlarging T2 lesions or active lesions    Review of Labs:  1. CSF reportedly with oligoclonal bands  JC virus 0.24  (09/11/15), 0.20 (01/02/16)      Assessment:   1.  Clinically definite Multiple Sclerosis, relapsing form, Stable clinically. MRI 08/10/2017 without activity but New T2 lesions since study dated 07/16/16; now on rituximab (for MS and IgG 4 related disease) since 07/01/2017 and tolerating well; most recent MRI 10/19/2018 (done because of vertigo) with no evidence of disease activity. No evidence for clinical relapse on exam, but with worsened fatigue initially after covid and now even more  problematic after helping with new grandchild in [place] .     Atypical features (red flags) include: seizure at onset  Individual risk factors for disease activity and worsening disability include: IgG4 related disease    2. Symptoms (brief description ) and their management:   . Neuropathic pain: Sometimes with full body, tension fire like pain. controlled on current regimen of duloxetine 14m at bedtime, gabapentin 6050mat bedtime,nortriptyline 50g at bedtime  . Spasms: Worse at the end of the day, controlled with current regimen of baclofen 1073mt bedtime, tizanadine 4mg31m bedtime, and diazepam 5mg 78m (uses about every 2 weeks)  . Fatigue: Sleeps well and depression well controlled. No obvious medication causes and hypothyroidism well controlled. Provigil causes nausea. Amantadine helpful. Worsened after having Covid in August 2021.  . UriMarland Kitchenary hesitancy: will monitor, currently not interested in starting medication  . Vestibular neuronitis (started 10/05/2018) now 80 % improved but still with positional  vertigo although more difficult to provoke  . Infrequent falls: Will avoid alcohol, will reach out if imbalance worsens, in that case  we will consider PT   . IgG 4 related lymphadenopathy; isolated to cervical lymph notes; stable  symptomatically  . Anterior uveitis (may be related to IgG 4 syndrome): HLA B27 negative; 2 episodes  in 2018 both OD; according to visit with Dr Lee oTruman Hayward/24/19 this improved after cycle 1of rituximab.   . Polypharmacy issues; Combined effects of baclofen, nortriptyline, gabapentin, tizanidine and prazosin on both fatigue and orthostatic symptoms at night. Will adjust over time    4. Mood/psychiatric therapy: Previous anxiety and depression, improved.  No problems at present     5. Lifestyle issues and recommended adjustments: None at present     6. Rehabilitation needs and DME requirements: None at present    7. Vocation/Disability Concerns: None at present       After  discussing the options available and the potential benefits (pros) and risks (cons), we mutually decided on the following management plan    Plan:  1. Trial of THC +/- CBD for insomnia. Go to [employer]  2. Use lowest dose of prazosin and tizanidine needed  3. Schedule follow-up in 6 months    Advised that Baclofen, nortriptyline, gabapentin prazosin and tizanidine are medications she takes most commonly associated with brain fog, impaired balance and lightheadedness    Time spent reviewing chart, outside records, communications  in preparation for visit, face to face time, counseling, completing orders, and documenting encounter : 40 minutes

## 2021-04-29 NOTE — Telephone Encounter (Signed)
.  Patient is requesting refill of medication, please see orders for preloaded prescription refill of DULoxetine (CYMBALTA) 60 MG CR capsule       Last visit with this physician:    Visit date not found  Next appointment with this physician:   Visit date not found    Request has been routed to provider

## 2021-04-30 DIAGNOSIS — Z1389 Encounter for screening for other disorder: Secondary | ICD-10-CM | POA: Diagnosis not present

## 2021-04-30 DIAGNOSIS — M47816 Spondylosis without myelopathy or radiculopathy, lumbar region: Secondary | ICD-10-CM | POA: Diagnosis not present

## 2021-04-30 DIAGNOSIS — Z79891 Long term (current) use of opiate analgesic: Secondary | ICD-10-CM | POA: Diagnosis not present

## 2021-04-30 DIAGNOSIS — M461 Sacroiliitis, not elsewhere classified: Secondary | ICD-10-CM | POA: Diagnosis not present

## 2021-04-30 DIAGNOSIS — M5136 Other intervertebral disc degeneration, lumbar region: Secondary | ICD-10-CM | POA: Diagnosis not present

## 2021-04-30 DIAGNOSIS — G894 Chronic pain syndrome: Secondary | ICD-10-CM | POA: Diagnosis not present

## 2021-04-30 DIAGNOSIS — M48061 Spinal stenosis, lumbar region without neurogenic claudication: Secondary | ICD-10-CM | POA: Diagnosis not present

## 2021-05-01 LAB — IGG SUBCLASS PANEL, BLOOD
IGG Subclass 1: 422 mg/dL (ref 240–1118)
IGG Subclass 2: 279 mg/dL (ref 124–549)
IGG Subclass 3: 11 mg/dL — ABNORMAL LOW (ref 21–134)
IGG Subclass 4: 113 mg/dL (ref 1–123)

## 2021-05-04 DIAGNOSIS — M546 Pain in thoracic spine: Secondary | ICD-10-CM | POA: Diagnosis not present

## 2021-05-04 DIAGNOSIS — M47816 Spondylosis without myelopathy or radiculopathy, lumbar region: Secondary | ICD-10-CM | POA: Diagnosis not present

## 2021-05-04 DIAGNOSIS — M461 Sacroiliitis, not elsewhere classified: Secondary | ICD-10-CM | POA: Diagnosis not present

## 2021-05-04 DIAGNOSIS — M5136 Other intervertebral disc degeneration, lumbar region: Secondary | ICD-10-CM | POA: Diagnosis not present

## 2021-05-04 DIAGNOSIS — I700 Atherosclerosis of aorta: Secondary | ICD-10-CM | POA: Diagnosis not present

## 2021-05-04 DIAGNOSIS — G894 Chronic pain syndrome: Secondary | ICD-10-CM | POA: Diagnosis not present

## 2021-05-04 DIAGNOSIS — M5090 Cervical disc disorder, unspecified, unspecified cervical region: Secondary | ICD-10-CM | POA: Diagnosis not present

## 2021-05-04 DIAGNOSIS — M48061 Spinal stenosis, lumbar region without neurogenic claudication: Secondary | ICD-10-CM | POA: Diagnosis not present

## 2021-05-04 DIAGNOSIS — M545 Low back pain, unspecified: Secondary | ICD-10-CM | POA: Diagnosis not present

## 2021-05-13 ENCOUNTER — Other Ambulatory Visit: Payer: Self-pay | Admitting: Legal Medicine

## 2021-05-13 DIAGNOSIS — I10 Essential (primary) hypertension: Secondary | ICD-10-CM

## 2021-05-25 DIAGNOSIS — M461 Sacroiliitis, not elsewhere classified: Secondary | ICD-10-CM | POA: Diagnosis not present

## 2021-05-25 DIAGNOSIS — M47816 Spondylosis without myelopathy or radiculopathy, lumbar region: Secondary | ICD-10-CM | POA: Diagnosis not present

## 2021-05-25 DIAGNOSIS — M48061 Spinal stenosis, lumbar region without neurogenic claudication: Secondary | ICD-10-CM | POA: Diagnosis not present

## 2021-05-25 DIAGNOSIS — M546 Pain in thoracic spine: Secondary | ICD-10-CM | POA: Diagnosis not present

## 2021-05-25 DIAGNOSIS — M5136 Other intervertebral disc degeneration, lumbar region: Secondary | ICD-10-CM | POA: Diagnosis not present

## 2021-05-25 DIAGNOSIS — M5090 Cervical disc disorder, unspecified, unspecified cervical region: Secondary | ICD-10-CM | POA: Diagnosis not present

## 2021-05-25 DIAGNOSIS — G894 Chronic pain syndrome: Secondary | ICD-10-CM | POA: Diagnosis not present

## 2021-05-28 ENCOUNTER — Telehealth (INDEPENDENT_AMBULATORY_CARE_PROVIDER_SITE_OTHER): Payer: Self-pay | Admitting: Internal Medicine

## 2021-05-28 NOTE — Telephone Encounter (Signed)
Pt called requesting to schedule a Mychart video visit. Agent scheduled soonest available and added to waitlist. Pt states she needs to be seen sooner due to getting a infusion. Please assist, Thank you.     CB# [phone]

## 2021-05-29 NOTE — Telephone Encounter (Signed)
LVM for patient. Offered [place] appointment on Mon 2/13 at 9:20am with Dr. Candiss Norse at [facility]. Asked patient to call back and confirm.

## 2021-06-01 ENCOUNTER — Other Ambulatory Visit (INDEPENDENT_AMBULATORY_CARE_PROVIDER_SITE_OTHER): Payer: Self-pay | Admitting: Nurse Practitioner

## 2021-06-01 ENCOUNTER — Ambulatory Visit (HOSPITAL_BASED_OUTPATIENT_CLINIC_OR_DEPARTMENT_OTHER): Payer: Medicare Other

## 2021-06-01 ENCOUNTER — Other Ambulatory Visit: Payer: Self-pay

## 2021-06-01 DIAGNOSIS — F4310 Post-traumatic stress disorder, unspecified: Secondary | ICD-10-CM

## 2021-06-01 NOTE — Telephone Encounter (Signed)
We received a refill request from pharmacy for prazosin (MINIPRESS) 1 MG capsule. Spoke to patient over the phone. Per patient she no longer take the medication. Patient stated she will call the clinic back to schedule f/u appt with NP Alford Highland.     Ben Avon, LVN

## 2021-06-01 NOTE — Telephone Encounter (Signed)
Patient did not respond to voicemail. Closing encounter since date/time of appointment has passed.

## 2021-06-02 ENCOUNTER — Ambulatory Visit (HOSPITAL_BASED_OUTPATIENT_CLINIC_OR_DEPARTMENT_OTHER)
Admit: 2021-06-02 | Discharge: 2021-06-02 | Disposition: A | Discharge status: Home Health | Payer: Medicare Other | Attending: Family Practice | Admitting: Family Practice

## 2021-06-02 ENCOUNTER — Ambulatory Visit
Admission: RE | Admit: 2021-06-02 | Discharge: 2021-06-02 | Disposition: A | Discharge status: Home / Self Care | Payer: Medicare Other | Attending: Family Practice | Admitting: Family Practice

## 2021-06-02 DIAGNOSIS — Z79891 Long term (current) use of opiate analgesic: Secondary | ICD-10-CM | POA: Diagnosis not present

## 2021-06-02 DIAGNOSIS — M48061 Spinal stenosis, lumbar region without neurogenic claudication: Secondary | ICD-10-CM | POA: Diagnosis not present

## 2021-06-02 DIAGNOSIS — G894 Chronic pain syndrome: Secondary | ICD-10-CM | POA: Diagnosis not present

## 2021-06-02 DIAGNOSIS — M47816 Spondylosis without myelopathy or radiculopathy, lumbar region: Secondary | ICD-10-CM | POA: Diagnosis not present

## 2021-06-02 DIAGNOSIS — Z1389 Encounter for screening for other disorder: Secondary | ICD-10-CM | POA: Diagnosis not present

## 2021-06-02 DIAGNOSIS — M461 Sacroiliitis, not elsewhere classified: Secondary | ICD-10-CM | POA: Diagnosis not present

## 2021-06-02 DIAGNOSIS — M5136 Other intervertebral disc degeneration, lumbar region: Secondary | ICD-10-CM | POA: Diagnosis not present

## 2021-06-02 DIAGNOSIS — Z13820 Encounter for screening for osteoporosis: Secondary | ICD-10-CM

## 2021-06-02 DIAGNOSIS — N959 Unspecified menopausal and perimenopausal disorder: Secondary | ICD-10-CM

## 2021-06-02 DIAGNOSIS — Z1231 Encounter for screening mammogram for malignant neoplasm of breast: Secondary | ICD-10-CM

## 2021-06-02 DIAGNOSIS — Z780 Asymptomatic menopausal state: Secondary | ICD-10-CM

## 2021-06-03 ENCOUNTER — Encounter (INDEPENDENT_AMBULATORY_CARE_PROVIDER_SITE_OTHER): Payer: Medicare Other | Admitting: Internal Medicine

## 2021-06-04 DIAGNOSIS — M47816 Spondylosis without myelopathy or radiculopathy, lumbar region: Secondary | ICD-10-CM | POA: Diagnosis not present

## 2021-06-04 DIAGNOSIS — M546 Pain in thoracic spine: Secondary | ICD-10-CM | POA: Diagnosis not present

## 2021-06-04 DIAGNOSIS — M5090 Cervical disc disorder, unspecified, unspecified cervical region: Secondary | ICD-10-CM | POA: Diagnosis not present

## 2021-06-04 DIAGNOSIS — M48061 Spinal stenosis, lumbar region without neurogenic claudication: Secondary | ICD-10-CM | POA: Diagnosis not present

## 2021-06-04 DIAGNOSIS — M461 Sacroiliitis, not elsewhere classified: Secondary | ICD-10-CM | POA: Diagnosis not present

## 2021-06-04 DIAGNOSIS — G894 Chronic pain syndrome: Secondary | ICD-10-CM | POA: Diagnosis not present

## 2021-06-04 DIAGNOSIS — M5136 Other intervertebral disc degeneration, lumbar region: Secondary | ICD-10-CM | POA: Diagnosis not present

## 2021-06-09 ENCOUNTER — Encounter (INDEPENDENT_AMBULATORY_CARE_PROVIDER_SITE_OTHER): Payer: Self-pay | Admitting: Internal Medicine

## 2021-06-09 DIAGNOSIS — D8989 Other specified disorders involving the immune mechanism, not elsewhere classified: Secondary | ICD-10-CM

## 2021-06-15 NOTE — Telephone Encounter (Signed)
Patient requesting medication refill of: Cymbalta 60 mg CR    Last refill: 04/29/2021    Last visit in this department 04/02/2020  Next visit in this department Visit date not found     Routed to MD/NP team as Rx request

## 2021-06-22 ENCOUNTER — Other Ambulatory Visit: Payer: Self-pay

## 2021-06-22 ENCOUNTER — Ambulatory Visit (INDEPENDENT_AMBULATORY_CARE_PROVIDER_SITE_OTHER): Payer: Medicare HMO

## 2021-06-22 DIAGNOSIS — G894 Chronic pain syndrome: Secondary | ICD-10-CM | POA: Diagnosis not present

## 2021-06-22 DIAGNOSIS — M48061 Spinal stenosis, lumbar region without neurogenic claudication: Secondary | ICD-10-CM | POA: Diagnosis not present

## 2021-06-22 DIAGNOSIS — M47816 Spondylosis without myelopathy or radiculopathy, lumbar region: Secondary | ICD-10-CM | POA: Diagnosis not present

## 2021-06-22 DIAGNOSIS — M461 Sacroiliitis, not elsewhere classified: Secondary | ICD-10-CM | POA: Diagnosis not present

## 2021-06-22 DIAGNOSIS — M5090 Cervical disc disorder, unspecified, unspecified cervical region: Secondary | ICD-10-CM | POA: Diagnosis not present

## 2021-06-22 DIAGNOSIS — I10 Essential (primary) hypertension: Secondary | ICD-10-CM

## 2021-06-22 DIAGNOSIS — M5136 Other intervertebral disc degeneration, lumbar region: Secondary | ICD-10-CM | POA: Diagnosis not present

## 2021-06-22 DIAGNOSIS — I2510 Atherosclerotic heart disease of native coronary artery without angina pectoris: Secondary | ICD-10-CM

## 2021-06-22 DIAGNOSIS — M546 Pain in thoracic spine: Secondary | ICD-10-CM | POA: Diagnosis not present

## 2021-06-22 DIAGNOSIS — J449 Chronic obstructive pulmonary disease, unspecified: Secondary | ICD-10-CM

## 2021-06-22 NOTE — Progress Notes (Signed)
Chronic Care Management Pharmacy Note  06/22/2021 Name:  Dorothy Parker MRN:  [id number] DOB:  1960/10/30  Subjective: Dorothy Parker is an 61 y.o. year old female who is a primary patient of Henrene Pastor, Zeb Comfort, MD.  The CCM team was consulted for assistance with disease management and care coordination needs.    Plan Updates:   Patient increased to 1.5PPD, will call quitline after visit with CPP   Engaged with patient by telephone for follow up visit in response to provider referral for pharmacy case management and/or care coordination services.   Consent to Services:  The patient was given the following information about Chronic Care Management services today, agreed to services, and gave verbal consent: 1. CCM service includes personalized support from designated clinical staff supervised by the primary care provider, including individualized plan of care and coordination with other care providers 2. 24/7 contact phone numbers for assistance for urgent and routine care needs. 3. Service will only be billed when office clinical staff spend 20 minutes or more in a month to coordinate care. 4. Only one practitioner may furnish and bill the service in a calendar month. 5.The patient may stop CCM services at any time (effective at the end of the month) by phone call to the office staff. 6. The patient will be responsible for cost sharing (co-pay) of up to 20% of the service fee (after annual deductible is met). Patient agreed to services and consent obtained.  Patient Care Team: Lillard Anes, MD as PCP - General (Family Medicine) Flossie Buffy., MD as Referring Physician (Cardiology) Dorthula Rue, MD as Referring Physician (Vascular Surgery) Duanne Limerick, OD as Consulting Physician (Ophthalmology) Lane Hacker, [facility] (Pharmacist)  Recent office visits:  07/30/20- order placed for bone density   06/16/20. Dr. Henrene Pastor PCP, hyperlipidemia, follow up 62mo,  no medication changes, CBC normal, kidney and liver tests normal, Cholesterol normal,  CCM referral, follow up 658mo   Recent consult visits:  11/06/2020 - Vascular -  continue antiplatelet and statin. Repeat carotid duplex in 1 year if no symptoms.   08/18/2020 - cardiology - recommend smoking cessation. Recommend continuing to follow-up with vascular/doppler scan.   Hospital visits:  None in previous 6 months  Objective:  Lab Results  Component Value Date   CREATININE 0.70 04/08/2021   BUN 14 04/08/2021   GFRNONAA 97 12/17/2019   GFRAA 112 12/17/2019   NA 140 04/08/2021   K 4.4 04/08/2021   CALCIUM 9.2 04/08/2021   CO2 24 04/08/2021   GLUCOSE 84 04/08/2021    No results found for: HGBA1C, FRUCTOSAMINE, GFR, MICROALBUR  Last diabetic Eye exam: No results found for: HMDIABEYEEXA  Last diabetic Foot exam: No results found for: HMDIABFOOTEX   Lab Results  Component Value Date   CHOL 133 04/08/2021   HDL 57 04/08/2021   LDLCALC 57 04/08/2021   TRIG 104 04/08/2021   CHOLHDL 2.3 04/08/2021    Hepatic Function Latest Ref Rng & Units 04/08/2021 12/15/2020 06/16/2020  Total Protein 6.0 - 8.5 g/dL 6.9 7.1 7.0  Albumin 3.8 - 4.8 g/dL 4.8 4.4 4.6  AST 0 - 40 IU/L 18 19 24   ALT 0 - 32 IU/L 12 15 19   Alk Phosphatase 44 - 121 IU/L 74 74 94  Total Bilirubin 0.0 - 1.2 mg/dL 0.5 0.4 0.4    Lab Results  Component Value Date/Time   TSH 0.378 (L) 04/08/2021 08:53 AM   TSH 0.688 12/17/2019 09:34 AM  CBC Latest Ref Rng & Units 04/08/2021 12/15/2020 06/16/2020  WBC 3.4 - 10.8 x10E3/uL 8.1 6.7 8.0  Hemoglobin 11.1 - 15.9 g/dL 14.6 14.4 14.8  Hematocrit 34.0 - 46.6 % 43.4 42.6 43.7  Platelets 150 - 450 x10E3/uL 220 207 214    No results found for: VD25OH  Clinical ASCVD: Yes  The 10-year ASCVD risk score (Arnett DK, et al., 2019) is: 7.5%   Values used to calculate the score:     Age: 58 years     Sex: Female     Is Non-Hispanic African American: No     Diabetic: No      Tobacco smoker: Yes     Systolic Blood Pressure: 161 mmHg     Is BP treated: Yes     HDL Cholesterol: 57 mg/dL     Total Cholesterol: 133 mg/dL    Depression screen [facility] 2/9 12/18/2020 12/15/2020 12/15/2020  Decreased Interest 0 0 0  Down, Depressed, Hopeless 0 0 0  PHQ - 2 Score 0 0 0  Altered sleeping - 3 -  Tired, decreased energy - 3 -  Change in appetite - 2 -  Feeling bad or failure about yourself  - 0 -  Trouble concentrating - 0 -  Moving slowly or fidgety/restless - 0 -  Suicidal thoughts - 0 -  PHQ-9 Score - 8 -  Difficult doing work/chores - Not difficult at all -     Social History   Tobacco Use  Smoking Status Every Day   Packs/day: 1.50   Years: 45.00   Pack years: 67.50   Types: Cigarettes  Smokeless Tobacco Never   BP Readings from Last 3 Encounters:  04/01/21 124/64  12/30/20 124/86  12/18/20 132/68   Pulse Readings from Last 3 Encounters:  04/01/21 78  12/30/20 68  12/18/20 88   Wt Readings from Last 3 Encounters:  04/01/21 101 lb (45.8 kg)  12/30/20 98 lb (44.5 kg)  12/18/20 98 lb (44.5 kg)   BMI Readings from Last 3 Encounters:  04/01/21 17.34 kg/m  12/30/20 17.36 kg/m  12/18/20 17.36 kg/m    Assessment/Interventions: Review of patient past medical history, allergies, medications, health status, including review of consultants reports, laboratory and other test data, was performed as part of comprehensive evaluation and provision of chronic care management services.   SDOH:  (Social Determinants of Health) assessments and interventions performed: Yes SDOH Interventions    Flowsheet Row Most Recent Value  SDOH Interventions   Financial Strain Interventions Intervention Not Indicated  Transportation Interventions Intervention Not Indicated      SDOH Screenings   Alcohol Screen: Not on file  Depression (PHQ2-9): Low Risk    PHQ-2 Score: 0  Financial Resource Strain: Low Risk    Difficulty of Paying Living Expenses: Not hard at all   Food Insecurity: No Food Insecurity   Worried About Charity fundraiser in the Last Year: Never true   Arboriculturist in the Last Year: Never true  Housing: Low Risk    Last Housing Risk Score: 0  Physical Activity: Not on file  Social Connections: Not on file  Stress: Not on file  Tobacco Use: High Risk   Smoking Tobacco Use: Every Day   Smokeless Tobacco Use: Never   Passive Exposure: Not on file  Transportation Needs: No Transportation Needs   Lack of Transportation (Medical): No   Lack of Transportation (Non-Medical): No    CCM Care Plan  Allergies  Allergen Reactions  Bupropion Other (See Comments)   Clarithromycin Other (See Comments)   Clopidogrel Other (See Comments)   Varenicline Other (See Comments)   Vitamin D Analogs Rash    High dosage of Vitamin D will cause rash High dosage of Vitamin D will cause rash High dosage of Vitamin D will cause rash    Prednisone Other (See Comments)    Hyperglycemia    Medications Reviewed Today     Reviewed by Lillard Anes, MD (Physician) on 04/01/21 at 1010  Med List Status: <None>   Medication Order Taking? Sig Documenting Provider Last Dose Status Informant  alendronate (FOSAMAX) 70 MG tablet [id number] No Take 1 tablet (70 mg total) by mouth every 7 (seven) days. Take with a full glass of water on an empty stomach. Lillard Anes, MD Taking Active   ascorbic acid (VITAMIN C) 500 MG tablet [id number] No Take by mouth. [provider] Taking Active   aspirin 81 MG EC tablet [id number] No  [provider] Taking Active   glycopyrrolate (ROBINUL) 1 MG tablet [id number] No TAKE TWO TABLETS BY MOUTH TWICE DAILY Lillard Anes, MD Taking Active   metoprolol tartrate (LOPRESSOR) 25 MG tablet [id number] No TAKE 1 TABLET BY MOUTH TWICE A DAY Lillard Anes, MD Taking Active   naloxone [facility]) 4 MG/0.1ML LIQD nasal spray kit [id number] No USE 1 SPRAY IN ONE NOSTRIL. MAY REPEAT IN OTHER  NOSTRIL AFTER 2-3 MINS IF NEEDED [provider] Taking Active   nitroGLYCERIN (NITROLINGUAL) 0.4 MG/SPRAY spray [id number] No Place under the tongue. [provider] Taking Active   oxyCODONE-acetaminophen (PERCOCET) 7.5-325 MG tablet [id number] No Take 1 tablet by mouth 4 (four) times daily as needed. [provider] Taking Active   quinapril (ACCUPRIL) 10 MG tablet [id number] No TAKE 1 TABLET BY MOUTH DAILY Lillard Anes, MD Taking Active   rosuvastatin (CRESTOR) 5 MG tablet [id number] No TAKE 1 TABLET BY MOUTH DAILY. Lillard Anes, MD Taking Active   STIOLTO RESPIMAT 2.5-2.5 MCG/ACT AERS [id number] No INHALE 1 PUFF INTO THE LUNGS IN THE MORNING AND AT BEDTIME Lillard Anes, MD Taking Active   triamcinolone cream (KENALOG) 0.1 % [id number]  APPLY A THIN LAYER TO THE AFFECTED AREA(S) TWICE A DAY. Lillard Anes, MD  Active   ZTLIDO 1.8 % [facility] [id number] No  [provider] Taking Active             Patient Active Problem List   Diagnosis Date Noted   Adult BMI <19 kg/sq m 04/01/2021   Osteoporosis 08/12/2020   Malnutrition of moderate degree ([place]) 08/16/2019   COPD (chronic obstructive pulmonary disease) (HCC)    Mixed hyperlipidemia    Major depressive disorder, single episode, moderate (HCC)    Tobacco use disorder 10/03/2018   Bilateral carotid artery stenosis 01/04/2018   Hx of CABG 12/02/2016   Essential hypertension 11/17/2015   CAD in native artery 11/17/2015    Immunization History  Administered Date(s) Administered   Influenza-Unspecified 02/02/2019, 03/12/2020, 01/08/2021   Pneumococcal Conjugate-13 01/27/2015   Pneumococcal Polysaccharide-23 01/27/2016    Conditions to be addressed/monitored:  Hypertension, Hyperlipidemia, Coronary Artery Disease, COPD and Depression  Care Plan : [place]  Updates made by Lane Hacker, RPH since 06/22/2021 12:00 AM     Problem: cad, htn, hld, copd    Priority: High  Onset Date: 08/05/2020     Long-Range Goal: Disease Management   Start Date: 08/05/2020  Expected End Date: 08/05/2021  Recent Progress: On track  Priority: High  Note:   Current Barriers:  Patient is interested in support for smoking cessation but has tried and failed various products in the past   Pharmacist Clinical Goal(s):  Patient will achieve control of smoking cessation as evidenced by continued use and recommended treatment through collaboration with PharmD and provider.   Interventions: 1:1 collaboration with Lillard Anes, MD regarding development and update of comprehensive plan of care as evidenced by provider attestation and co-signature Inter-disciplinary care team collaboration (see longitudinal plan of care) Comprehensive medication review performed; medication list updated in electronic medical record  Hypertension (BP goal <130/80) -Not ideally controlled -Current treatment: metoprolol tartrate 25 mg bid Appropriate, Effective, Safe, Accessible Quinapril 10 mg daily Appropriate, Effective, Safe, Accessible -Medications previously tried: none reported -Current home readings:  March 2023: Didn't have list -Current dietary habits: Lives alone and doesn't cook for herself a lot. Fish and shrimp yesterday. Filet mignon in [place] on Sunday. Toast streudel, cereal or bacon and eggs for breakfast. Likes fruits and vegetables but hard to use before they go bad.  -Current exercise habits: Uses above ground pool at her home during warmer weather. Back pain limits exercise.  -Denies hypotensive/hypertensive symptoms -Educated on BP goals and benefits of medications for prevention of heart attack, stroke and kidney damage; Daily salt intake goal < 2300 mg; Exercise goal of 150 minutes per week; Importance of home blood pressure monitoring; -Counseled to monitor BP at home occasionally, document, and provide log at future appointments -Counseled  on diet and exercise extensively  Hyperlipidemia/CAD: (LDL goal < 55) -Controlled -Current treatment: aspirin 81 mg ec Appropriate, Effective, Safe, Accessible crestor 5 mg daily Appropriate, Effective, Safe, Accessible -Medications previously tried: none reported  -Current dietary patterns: Drinks 2% milk daily. Rarely eats cheese.  -Current exercise habits: limited due to back pain.  -Educated on Cholesterol goals;  Benefits of statin for ASCVD risk reduction; Importance of limiting foods high in cholesterol; Exercise goal of 150 minutes per week; -Counseled on diet and exercise extensively Recommended to continue current medication  COPD (Goal: control symptoms and prevent exacerbations) -Controlled -Current treatment  Stiolto 1 puff into the lungs in am and hs Appropriate, Effective, Safe, Accessible -Medications previously tried: none reported  -Gold Grade: Gold 2 (FEV1 50-79%) -Pulmonary function testing: 08/16/2019 - 61% -Exacerbations requiring treatment in last 6 months: none reported -Patient reports consistent use of maintenance inhaler -Frequency of rescue inhaler use: none per patient  -Counseled on Benefits of consistent maintenance inhaler use -Recommended to continue current medication  Osteoporosis / Osteopenia (Goal prevent fractures) -Not ideally controlled -Last DEXA Scan: 07/2020   T-Score femoral neck: -3.2  T-Score lumbar spine: -3.5 -Patient is a candidate for pharmacologic treatment due to T-Score < -2.5 in lumbar spine -Current treatment  Alendronate 70 mg weekly (08/18/20 Start) Appropriate, Effective, Safe, Accessible Calcium with D supplementation recommended by Dr. Henrene Pastor and pharmacist  -Medications previously tried: n/a  -Recommend [id number] units of vitamin D daily. Recommend 1200 mg of calcium daily from dietary and supplemental sources. Recommend weight-bearing and muscle strengthening exercises for building and maintaining bone  density. -Counseled on diet and exercise extensively Educated on calcium and vitamin d recommendations. Patient does drink boost but has very little intake of food due to appetite.   Tobacco use (Goal smoking cessation ) -Uncontrolled -  March 2023: 1.5PPD September 2022: 2PPD October 2022: 1 PPD -Previous quit attempts: Chantix - allergic reaction, patches - didn't work, wellbutrin, gum -  dentures may prohibit chewing and parking -Current treatment  None reported -Patient smokes Within 30 minutes of waking -Patient triggers include: watching television, driving and finishing a meal -On a scale of 1-10, reports MOTIVATION to quit is 6 -On a scale of 1-10, reports CONFIDENCE in quitting is 6 -Provided contact information for [place] [facility] (1-800-QUIT-NOW) and encouraged patient to reach out to this group for support. August 2022: Counseled on benefits of smoking cessation. Discussed options of smoking cessation and patient is interested in inhaler. Insurance will not approve inhaler until nasal spray has been tried. Will discuss with patient and then Dr. Henrene Pastor if patient is ready to proceed.  October 2022: Nicotine nasal spray is causing ADR's (Congestion and sore throat), will ask PCP for Inhaled Nicotine. Patient smoking 1ppd (Down from 2PPD). Goal set for 1/2PPD by Nov 16th November 2022: Patient hasn't gotten inhaler and is still smoking 1PPD. New goal is 3/4 PPD by December and I will re-ask PCP for inhaler March 2023: Patient will call [phone] Quit-Now after she hangs up. She is interested in quitting, she is up to 1.5PPD and wants to cut back  Pain (Goal: manage symptoms of pain with specialist) -Controlled -Current treatment  Oxycodone-acetaminophen 7.5-325 mg QID Appropriate, Effective, Safe, Accessible Naloxone 4 mg/0.1 ml nasal spray 1 spray in one nostril may repeat in other nostril after 2-3 minutes if needed -Medications previously tried: none reported   -Recommended to continue  current medication     Patient Goals/Self-Care Activities Patient will:  - take medications as prescribed focus on medication adherence by considering a pill box.  check blood pressure weekly, document, and provide at future appointments engage in dietary modifications by limiting salt and focusing on heart-healthy diet.   Follow Up Plan: Telephone follow up appointment with care management team member scheduled for: Pharmacist 12/2021 to follow-up on smoking cessation   [place] Constable, Pharm.D. - [phone]        Medication Assistance: None required.  Patient affirms current coverage meets needs.  Patient's preferred pharmacy is:  [place], [place] [address] Phone: [phone] Fax: [phone]   Uses pill box? No - keeps stored on shelf in medicine cabinet Pt endorses good compliance  We discussed: Benefits of medication synchronization, packaging and delivery as well as enhanced pharmacist oversight with [employer]. Patient decided to: Continue current medication management strategy  Care Plan and Follow Up Patient Decision:  Patient agrees to Care Plan and Follow-up.  Plan: Telephone follow up appointment with care management team member scheduled for:  Pharmacy Team will follow-up with smoking cessation progress 9/23  [place] Constable, Pharm.D. - [phone]

## 2021-06-22 NOTE — Patient Instructions (Signed)
Visit Information ? ? Goals Addressed   ?None ?  ? ?Patient Care Plan: CCM Pharmacy Care Plan  ?  ? ?Problem Identified: cad, htn, hld, copd   ?Priority: High  ?Onset Date: 08/05/2020  ?  ? ?Long-Range Goal: Disease Management   ?Start Date: 08/05/2020  ?Expected End Date: 08/05/2021  ?Recent Progress: On track  ?Priority: High  ?Note:   ?Current Barriers:  ?Patient is interested in support for smoking cessation but has tried and failed various products in the past ? ? ?Pharmacist Clinical Goal(s):  ?Patient will achieve control of smoking cessation as evidenced by continued use and recommended treatment through collaboration with PharmD and provider.  ? ?Interventions: ?1:1 collaboration with Abigail Miyamoto, MD regarding development and update of comprehensive plan of care as evidenced by provider attestation and co-signature ?Inter-disciplinary care team collaboration (see longitudinal plan of care) ?Comprehensive medication review performed; medication list updated in electronic medical record ? ?Hypertension (BP goal <130/80) ?-Not ideally controlled ?-Current treatment: ?metoprolol tartrate 25 mg bid Appropriate, Effective, Safe, Accessible ?Quinapril 10 mg daily Appropriate, Effective, Safe, Accessible ?-Medications previously tried: none reported ?-Current home readings:  ?March 2023: Didn't have list ?-Current dietary habits: Lives alone and doesn't cook for herself a lot. Fish and shrimp yesterday. Filet mignon in airfryer on Sunday. Toast streudel, cereal or bacon and eggs for breakfast. Likes fruits and vegetables but hard to use before they go bad.  ?-Current exercise habits: Uses above ground pool at her home during warmer weather. Back pain limits exercise.  ?-Denies hypotensive/hypertensive symptoms ?-Educated on BP goals and benefits of medications for prevention of heart attack, stroke and kidney damage; ?Daily salt intake goal < 2300 mg; ?Exercise goal of 150 minutes per week; ?Importance of  home blood pressure monitoring; ?-Counseled to monitor BP at home occasionally, document, and provide log at future appointments ?-Counseled on diet and exercise extensively ? ?Hyperlipidemia/CAD: (LDL goal < 55) ?-Controlled ?-Current treatment: ?aspirin 81 mg ec Appropriate, Effective, Safe, Accessible ?crestor 5 mg daily Appropriate, Effective, Safe, Accessible ?-Medications previously tried: none reported  ?-Current dietary patterns: Drinks 2% milk daily. Rarely eats cheese.  ?-Current exercise habits: limited due to back pain.  ?-Educated on Cholesterol goals;  ?Benefits of statin for ASCVD risk reduction; ?Importance of limiting foods high in cholesterol; ?Exercise goal of 150 minutes per week; ?-Counseled on diet and exercise extensively ?Recommended to continue current medication ? ?COPD (Goal: control symptoms and prevent exacerbations) ?-Controlled ?-Current treatment  ?Stiolto 1 puff into the lungs in am and hs Appropriate, Effective, Safe, Accessible ?-Medications previously tried: none reported  ?-Gold Grade: Gold 2 (FEV1 50-79%) ?-Pulmonary function testing: 08/16/2019 - 61% ?-Exacerbations requiring treatment in last 6 months: none reported ?-Patient reports consistent use of maintenance inhaler ?-Frequency of rescue inhaler use: none per patient  ?-Counseled on Benefits of consistent maintenance inhaler use ?-Recommended to continue current medication ? ?Osteoporosis / Osteopenia (Goal prevent fractures) ?-Not ideally controlled ?-Last DEXA Scan: 07/2020  ? T-Score femoral neck: -3.2 ? T-Score lumbar spine: -3.5 ?-Patient is a candidate for pharmacologic treatment due to T-Score < -2.5 in lumbar spine ?-Current treatment  ?Alendronate 70 mg weekly (08/18/20 Start) Appropriate, Effective, Safe, Accessible ?Calcium with D supplementation recommended by Dr. Marina Goodell and pharmacist  ?-Medications previously tried: n/a  ?-Recommend [id number] units of vitamin D daily. Recommend 1200 mg of calcium daily from  dietary and supplemental sources. Recommend weight-bearing and muscle strengthening exercises for building and maintaining bone density. ?-Counseled on diet and exercise  extensively ?Educated on calcium and vitamin d recommendations. Patient does drink boost but has very little intake of food due to appetite.  ? ?Tobacco use (Goal smoking cessation ) ?-Uncontrolled -  ?March 2023: 1.5PPD ?September 2022: 2PPD ?October 2022: 1 PPD ?-Previous quit attempts: Chantix - allergic reaction, patches - didn't work, wellbutrin, gum - dentures may prohibit chewing and parking ?-Current treatment  ?None reported ?-Patient smokes Within 30 minutes of waking ?-Patient triggers include: watching television, driving and finishing a meal ?-On a scale of 1-10, reports MOTIVATION to quit is 6 ?-On a scale of 1-10, reports CONFIDENCE in quitting is 6 ?-Provided contact information for [place] [facility]uit Line (1-800-QUIT-NOW) and encouraged patient to reach out to this group for support. ?August 2022: Counseled on benefits of smoking cessation. Discussed options of smoking cessation and patient is interested in inhaler. Insurance will not approve inhaler until nasal spray has been tried. Will discuss with patient and then Dr. Marina Goodell if patient is ready to proceed.  ?October 2022: Nicotine nasal spray is causing ADR's (Congestion and sore throat), will ask PCP for Inhaled Nicotine. Patient smoking 1ppd (Down from 2PPD). Goal set for 1/2PPD by Nov 16th ?November 2022: Patient hasn't gotten inhaler and is still smoking 1PPD. New goal is 3/4 PPD by December and I will re-ask PCP for inhaler ?March 2023: Patient will call 1800 Quit-Now after she hangs up. She is interested in quitting, she is up to 1.5PPD and wants to cut back ? ?Pain (Goal: manage symptoms of pain with specialist) ?-Controlled ?-Current treatment  ?Oxycodone-acetaminophen 7.5-325 mg QID Appropriate, Effective, Safe, Accessible ?Naloxone 4 mg/0.1 ml nasal spray 1 spray in one nostril  may repeat in other nostril after 2-3 minutes if needed ?-Medications previously tried: none reported   ?-Recommended to continue current medication ? ? ? ? ?Patient Goals/Self-Care Activities ?Patient will:  ?- take medications as prescribed ?focus on medication adherence by considering a pill box.  ?check blood pressure weekly, document, and provide at future appointments ?engage in dietary modifications by limiting salt and focusing on heart-healthy diet.  ? ?Follow Up Plan: Telephone follow up appointment with care management team member scheduled for: Pharmacist 12/2021 to follow-up on smoking cessation  ? ?Artelia Laroche, Pharm.D. - [phone] ? ?  ? ? ?Dorothy Parker was given information about Chronic Care Management services today including:  ?CCM service includes personalized support from designated clinical staff supervised by her physician, including individualized plan of care and coordination with other care providers ?24/7 contact phone numbers for assistance for urgent and routine care needs. ?Standard insurance, coinsurance, copays and deductibles apply for chronic care management only during months in which we provide at least 20 minutes of these services. Most insurances cover these services at 100%, however patients may be responsible for any copay, coinsurance and/or deductible if applicable. This service may help you avoid the need for more expensive face-to-face services. ?Only one practitioner may furnish and bill the service in a calendar month. ?The patient may stop CCM services at any time (effective at the end of the month) by phone call to the office staff. ? ?Patient agreed to services and verbal consent obtained.  ? ?The patient verbalized understanding of instructions, educational materials, and care plan provided today and declined offer to receive copy of patient instructions, educational materials, and care plan.  ?The pharmacy team will reach out to the patient again over the next  60 days.  ? ?Dorothy Parker, [facility] [phone] ? ?

## 2021-06-24 ENCOUNTER — Telehealth (INDEPENDENT_AMBULATORY_CARE_PROVIDER_SITE_OTHER): Payer: Self-pay | Admitting: Nurse Practitioner

## 2021-06-24 DIAGNOSIS — F4310 Post-traumatic stress disorder, unspecified: Secondary | ICD-10-CM

## 2021-06-24 NOTE — Telephone Encounter (Signed)
Prescription Refill Request     Incoming call from patient requesting refill     Prescribing provider: Alford Highland     Last office visit: 03/09/21  Next office visit:none    Requested Medication/s:buPROPion (WELLBUTRIN) 300 MG Extended-Release tablet         Send to:     [id number] [id number] Phone: [phone]: [phone]          Is patient out of medication? none    When did patient run out of medication? unk     Has Patient been advised this message will be transmitted to office and if any issues can expect a response within the next 72 hours? YDarreld Mclean

## 2021-06-24 NOTE — Addendum Note (Signed)
Addended byKathrynn Humble on: 06/24/2021 10:33 AM     Modules accepted: Orders

## 2021-06-25 ENCOUNTER — Telehealth (HOSPITAL_BASED_OUTPATIENT_CLINIC_OR_DEPARTMENT_OTHER): Payer: Self-pay

## 2021-06-25 NOTE — Telephone Encounter (Signed)
Returned patient's call, had left a message on scheduling line. Requesting to schedule RITUXAN for our [place] office. Informed her that authorization was currently pending. We will call to schedule once obtained.

## 2021-06-25 NOTE — Telephone Encounter (Signed)
Sent MyChart message requesting a return call to Call Center to schedule an appt.         Brand Males, MA

## 2021-06-25 NOTE — Telephone Encounter (Signed)
06/25/21 at 2:05 P.M.  Patient left message on scheduling line to schedule INFUSION.   Please call and follow up with patient.

## 2021-06-26 MED ORDER — BUPROPION XL (DAILY) 300 MG OR TB24
300.0000 mg | ORAL_TABLET | Freq: Every morning | ORAL | 0 refills | Status: AC
Start: 2021-06-26 — End: ?

## 2021-06-26 NOTE — Telephone Encounter (Signed)
Called patient to schedule RITUXAN for our [place] office. Made apt for Tuesday 3/14, per patient's request.         Debra Bolton, Debra Bolton; [facility]fusion Scheduling; [facility] Infusion Authorizations; Debra Bolton; Debra Bolton, Debra Bolton 45 minutes ago (9:00 AM)     JM  This request was approved

## 2021-06-29 ENCOUNTER — Ambulatory Visit (INDEPENDENT_AMBULATORY_CARE_PROVIDER_SITE_OTHER): Payer: Medicare Other | Admitting: Ophthalmology

## 2021-06-29 DIAGNOSIS — H209 Unspecified iridocyclitis: Secondary | ICD-10-CM

## 2021-06-29 MED ORDER — PREDNISOLONE ACETATE 1 % OP SUSP
1.0000 [drp] | Freq: Two times a day (BID) | OPHTHALMIC | 1 refills | Status: AC
Start: 2021-06-29 — End: 2021-07-29

## 2021-06-29 NOTE — Patient Instructions (Addendum)
Jerrilyn Cairo, MD  Director of Uveitis in the Department of Ophthalmology  [facility]    Consider adding methotrexate to current Rituxan.

## 2021-06-29 NOTE — Assessment & Plan Note (Addendum)
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 Mildly active today pending repeat Rituxan q 6 months, has been 6 months since last infusion, rec restart PF daily or bid for now pending Rituxan. Improved VA OD, patient happy with improvement s/p CE/IOL OD 09/2020. High risk of vision loss from uncontrolled uveitis, agree with systemic treatment. Uveitis precautions emphasized. Recommendations discussed with outside providers.    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 OCT mac w/o edema ou today.    Pending repeat Rituxan tomorrow, has been 7 months, rec 6 months or sooner. Consider MTX in future., mildly active OD, quiet OS.

## 2021-06-30 ENCOUNTER — Ambulatory Visit
Admission: RE | Admit: 2021-06-30 | Discharge: 2021-06-30 | Disposition: A | Discharge status: Home / Self Care | Payer: Medicare Other | Attending: Internal Medicine | Admitting: Internal Medicine

## 2021-06-30 VITALS — BP 111/72 | HR 85 | Temp 96.9°F | Resp 16 | Ht 64.0 in | Wt 139.0 lb

## 2021-06-30 DIAGNOSIS — M461 Sacroiliitis, not elsewhere classified: Secondary | ICD-10-CM | POA: Diagnosis not present

## 2021-06-30 DIAGNOSIS — Z1389 Encounter for screening for other disorder: Secondary | ICD-10-CM | POA: Diagnosis not present

## 2021-06-30 DIAGNOSIS — Z79891 Long term (current) use of opiate analgesic: Secondary | ICD-10-CM | POA: Diagnosis not present

## 2021-06-30 DIAGNOSIS — M5136 Other intervertebral disc degeneration, lumbar region: Secondary | ICD-10-CM | POA: Diagnosis not present

## 2021-06-30 DIAGNOSIS — M47816 Spondylosis without myelopathy or radiculopathy, lumbar region: Secondary | ICD-10-CM | POA: Diagnosis not present

## 2021-06-30 DIAGNOSIS — G894 Chronic pain syndrome: Secondary | ICD-10-CM | POA: Diagnosis not present

## 2021-06-30 DIAGNOSIS — M48061 Spinal stenosis, lumbar region without neurogenic claudication: Secondary | ICD-10-CM | POA: Diagnosis not present

## 2021-06-30 DIAGNOSIS — H209 Unspecified iridocyclitis: Secondary | ICD-10-CM | POA: Insufficient documentation

## 2021-06-30 DIAGNOSIS — D8989 Other specified disorders involving the immune mechanism, not elsewhere classified: Secondary | ICD-10-CM

## 2021-06-30 DIAGNOSIS — G35 Multiple sclerosis: Secondary | ICD-10-CM | POA: Insufficient documentation

## 2021-06-30 LAB — CBC WITH DIFF, BLOOD
ANC-Automated: 3.2 10*3/uL (ref 1.6–7.0)
ANC-Instrument: 3.2 10*3/uL (ref 1.6–7.0)
Abs Basophils: 0 10*3/uL (ref <0.2)
Abs Eosinophils: 0.4 10*3/uL (ref 0.0–0.5)
Abs Lymphs: 2.3 10*3/uL (ref 0.8–3.1)
Abs Monos: 0.8 10*3/uL (ref 0.2–0.8)
Basophils: 0 %
Eosinophils: 7 %
Hct: 41.8 % (ref 34.0–45.0)
Hgb: 13.3 gm/dL (ref 11.2–15.7)
Lymphocytes: 34 %
MCH: 29.8 pg (ref 26.0–32.0)
MCHC: 31.8 g/dL — ABNORMAL LOW (ref 32.0–36.0)
MCV: 93.5 um3 (ref 79.0–95.0)
MPV: 9 fL — ABNORMAL LOW (ref 9.4–12.4)
Monocytes: 12 %
Plt Count: 294 10*3/uL (ref 140–370)
RBC: 4.47 10*6/uL (ref 3.90–5.20)
RDW: 12.3 % (ref 12.0–14.0)
Segs: 48 %
WBC: 6.7 10*3/uL (ref 4.0–10.0)

## 2021-06-30 LAB — COMPREHENSIVE METABOLIC PANEL, BLOOD
ALT (SGPT): 26 U/L (ref 0–33)
AST (SGOT): 32 U/L (ref 0–32)
Albumin: 4.3 g/dL (ref 3.5–5.2)
Alkaline Phos: 69 U/L (ref 40–130)
Anion Gap: 13 mmol/L (ref 7–15)
BUN: 14 mg/dL (ref 8–23)
Bicarbonate: 24 mmol/L (ref 22–29)
Bilirubin, Tot: 0.25 mg/dL (ref <1.2)
Calcium: 10 mg/dL (ref 8.5–10.6)
Chloride: 101 mmol/L (ref 98–107)
Creatinine: 0.79 mg/dL (ref 0.51–0.95)
Glucose: 79 mg/dL (ref 70–99)
Potassium: 4.4 mmol/L (ref 3.5–5.1)
Sodium: 138 mmol/L (ref 136–145)
Total Protein: 7.2 g/dL (ref 6.0–8.0)
eGFR Based on CKD-EPI 2021 Equation: >60 mL/min/{1.73_m2}

## 2021-06-30 MED ORDER — SODIUM CHLORIDE 0.9 % IV SOLN
1000.0000 mg | Freq: Once | INTRAVENOUS | Status: AC
Start: 2021-06-30 — End: 2021-06-30
  Administered 2021-06-30 (×2): 1000 mg via INTRAVENOUS
  Filled 2021-06-30: qty 100

## 2021-06-30 MED ORDER — ACETAMINOPHEN 325 MG PO TABS
325.0000 mg | ORAL_TABLET | Freq: Once | ORAL | Status: DC | PRN
Start: 2021-06-30 — End: 2021-07-04

## 2021-06-30 MED ORDER — DIPHENHYDRAMINE HCL 50 MG/ML IJ SOLN
25.0000 mg | Freq: Once | INTRAMUSCULAR | Status: AC
Start: 2021-06-30 — End: 2021-06-30
  Administered 2021-06-30 (×2): 25 mg via INTRAVENOUS
  Filled 2021-06-30: qty 1

## 2021-06-30 MED ORDER — ACETAMINOPHEN 325 MG PO TABS
650.0000 mg | ORAL_TABLET | Freq: Once | ORAL | Status: AC
Start: 2021-06-30 — End: 2021-06-30
  Administered 2021-06-30 (×2): 650 mg via ORAL
  Filled 2021-06-30: qty 2

## 2021-06-30 MED ORDER — ONDANSETRON HCL 4 MG/2ML IV SOLN
8.0000 mg | Freq: Once | INTRAMUSCULAR | Status: AC | PRN
Start: 2021-06-30 — End: 2021-06-30
  Administered 2021-06-30 (×2): 8 mg via INTRAVENOUS
  Filled 2021-06-30: qty 4

## 2021-06-30 MED ORDER — SODIUM CHLORIDE 0.9 % IV SOLN
INTRAVENOUS | Status: AC
Start: 2021-06-30 — End: 2021-07-01

## 2021-06-30 MED ORDER — METHYLPREDNISOLONE SODIUM SUCC 125 MG IJ SOLR CUSTOM
100.0000 mg | Freq: Once | INTRAMUSCULAR | Status: AC
Start: 2021-06-30 — End: 2021-06-30
  Administered 2021-06-30 (×2): 100 mg via INTRAVENOUS
  Filled 2021-06-30: qty 125

## 2021-06-30 NOTE — Interdisciplinary (Signed)
Non-Chemotherapy Infusion Nursing Note - [place] is a 61 year old female who presents for infusion of rituxan, lab draw via piv.    Vitals:    06/30/21 0857 06/30/21 1030 06/30/21 1130 06/30/21 1330   BP: 119/65 101/60 120/78 111/72   BP Location: Left arm      BP Patient Position: Sitting      Pulse: 82 77 82 85   Resp: '18 16 16 16   '$ Temp: 96.9 F (36.1 C)      TempSrc: Temporal      SpO2: 97%      Weight: 63 kg (139 lb)      Height: '5\' 4"'$  (1.626 m)        Pain Score: NA (pre med, non-pain or scheduled)  Body surface area is 1.69 meters squared.  Body mass index is 23.86 kg/m.    Pre-treatment nursing assessment:  No problems identified upon assessment.    [name] tolerated treatment well.    Post blood return: Brisk  Post-Flush: NS  Medications   sodium chloride 0.9% infusion (0 mL IntraVENOUS Stopped 06/30/21 1330)   acetaminophen (TYLENOL) tablet 325 mg (has no administration in time range)   acetaminophen (TYLENOL) tablet 650 mg (650 mg Oral Given 06/30/21 0919)   diphenhydrAMINE (BENADRYL) injection 25 mg (25 mg IntraVENOUS Given 06/30/21 0919)   methylPREDNISolone sodium succinate (SOLU-MEDROL) injection 100 mg (100 mg IntraVENOUS Given 06/30/21 0920)   riTUXimab (RITUXAN) 1,000 mg in sodium chloride 0.9 % 1,000 mL infusion (0 mg IntraVENOUS Completed 06/30/21 1325)   ondansetron (ZOFRAN) injection 8 mg (8 mg IntraVENOUS Given 06/30/21 0957)     Patient Education  Learner: Patient  Barriers to learning: No Barriers  Readiness to learn: Acceptance  Method: Explanation    Treatment Education: Information/teaching given to patient including: signs and symptoms of infection, bleeding, adverse reaction(s), symptom control, and when to notify MD.    [name]: Instructed patient to call for assistance.    Pain Education: Patient instructed to contact nurse if pain should develop or if their current pain therapy becomes ineffective.    Response: Verbalizes  understanding    Discharge Plan  Discharge instructions given to patient.  Future appointments given and reviewed with treatment plan.  Discharge Mode: Ambulatory  Discharge Time: 1332  Accompanied by: Family  Discharged To: Home

## 2021-07-02 DIAGNOSIS — M5136 Other intervertebral disc degeneration, lumbar region: Secondary | ICD-10-CM | POA: Diagnosis not present

## 2021-07-02 DIAGNOSIS — M48061 Spinal stenosis, lumbar region without neurogenic claudication: Secondary | ICD-10-CM | POA: Diagnosis not present

## 2021-07-02 DIAGNOSIS — M5090 Cervical disc disorder, unspecified, unspecified cervical region: Secondary | ICD-10-CM | POA: Diagnosis not present

## 2021-07-02 DIAGNOSIS — G894 Chronic pain syndrome: Secondary | ICD-10-CM | POA: Diagnosis not present

## 2021-07-02 DIAGNOSIS — M47816 Spondylosis without myelopathy or radiculopathy, lumbar region: Secondary | ICD-10-CM | POA: Diagnosis not present

## 2021-07-02 DIAGNOSIS — M461 Sacroiliitis, not elsewhere classified: Secondary | ICD-10-CM | POA: Diagnosis not present

## 2021-07-02 DIAGNOSIS — M546 Pain in thoracic spine: Secondary | ICD-10-CM | POA: Diagnosis not present

## 2021-07-06 ENCOUNTER — Telehealth (INDEPENDENT_AMBULATORY_CARE_PROVIDER_SITE_OTHER): Payer: Medicare Other | Admitting: Internal Medicine

## 2021-07-08 ENCOUNTER — Other Ambulatory Visit: Payer: Self-pay | Admitting: Legal Medicine

## 2021-07-17 DIAGNOSIS — J449 Chronic obstructive pulmonary disease, unspecified: Secondary | ICD-10-CM

## 2021-07-17 DIAGNOSIS — I119 Hypertensive heart disease without heart failure: Secondary | ICD-10-CM | POA: Diagnosis not present

## 2021-07-17 DIAGNOSIS — E785 Hyperlipidemia, unspecified: Secondary | ICD-10-CM

## 2021-07-21 ENCOUNTER — Other Ambulatory Visit: Payer: Self-pay | Admitting: Legal Medicine

## 2021-07-23 DIAGNOSIS — G894 Chronic pain syndrome: Secondary | ICD-10-CM | POA: Diagnosis not present

## 2021-07-23 DIAGNOSIS — M5090 Cervical disc disorder, unspecified, unspecified cervical region: Secondary | ICD-10-CM | POA: Diagnosis not present

## 2021-07-23 DIAGNOSIS — M546 Pain in thoracic spine: Secondary | ICD-10-CM | POA: Diagnosis not present

## 2021-07-23 DIAGNOSIS — M48061 Spinal stenosis, lumbar region without neurogenic claudication: Secondary | ICD-10-CM | POA: Diagnosis not present

## 2021-07-23 DIAGNOSIS — M47816 Spondylosis without myelopathy or radiculopathy, lumbar region: Secondary | ICD-10-CM | POA: Diagnosis not present

## 2021-07-23 DIAGNOSIS — M461 Sacroiliitis, not elsewhere classified: Secondary | ICD-10-CM | POA: Diagnosis not present

## 2021-07-23 DIAGNOSIS — M5136 Other intervertebral disc degeneration, lumbar region: Secondary | ICD-10-CM | POA: Diagnosis not present

## 2021-07-29 ENCOUNTER — Telehealth: Payer: Self-pay

## 2021-07-29 DIAGNOSIS — Z1389 Encounter for screening for other disorder: Secondary | ICD-10-CM | POA: Diagnosis not present

## 2021-07-29 DIAGNOSIS — G894 Chronic pain syndrome: Secondary | ICD-10-CM | POA: Diagnosis not present

## 2021-07-29 DIAGNOSIS — M48061 Spinal stenosis, lumbar region without neurogenic claudication: Secondary | ICD-10-CM | POA: Diagnosis not present

## 2021-07-29 DIAGNOSIS — Z79891 Long term (current) use of opiate analgesic: Secondary | ICD-10-CM | POA: Diagnosis not present

## 2021-07-29 DIAGNOSIS — M47816 Spondylosis without myelopathy or radiculopathy, lumbar region: Secondary | ICD-10-CM | POA: Diagnosis not present

## 2021-07-29 DIAGNOSIS — M461 Sacroiliitis, not elsewhere classified: Secondary | ICD-10-CM | POA: Diagnosis not present

## 2021-07-29 DIAGNOSIS — M5136 Other intervertebral disc degeneration, lumbar region: Secondary | ICD-10-CM | POA: Diagnosis not present

## 2021-07-29 NOTE — Progress Notes (Signed)
? ? ?  Chronic Care Management ?Pharmacy Assistant  ? ?Name: Dorothy Parker  MRN: [id number] DOB: [birth year] ? ? ?Reason for Encounter: General Adherence Call  ? ? ?Recent office visits:  ?None ? ?Recent consult visits:  ?None ? ?Hospital visits:  ?None ? ?Medications: ?Outpatient Encounter Medications as of 07/29/2021  ?Medication Sig  ? alendronate (FOSAMAX) 70 MG tablet Take 1 tablet (70 mg total) by mouth every 7 (seven) days. Take with a full glass of water on an empty stomach.  ? ascorbic acid (VITAMIN C) 500 MG tablet Take by mouth.  ? aspirin 81 MG EC tablet   ? glycopyrrolate (ROBINUL) 1 MG tablet TAKE TWO TABLETS BY MOUTH TWICE DAILY  ? metoprolol tartrate (LOPRESSOR) 25 MG tablet TAKE 1 TABLET BY MOUTH TWICE A DAY  ? naloxone (NARCAN) 4 MG/0.1ML LIQD nasal spray kit USE 1 SPRAY IN ONE NOSTRIL. MAY REPEAT IN OTHER NOSTRIL AFTER 2-3 MINS IF NEEDED  ? nitroGLYCERIN (NITROLINGUAL) 0.4 MG/SPRAY spray Place under the tongue.  ? oxyCODONE-acetaminophen (PERCOCET) 7.5-325 MG tablet Take 1 tablet by mouth 4 (four) times daily as needed.  ? quinapril (ACCUPRIL) 10 MG tablet TAKE 1 TABLET BY MOUTH DAILY  ? rosuvastatin (CRESTOR) 5 MG tablet TAKE 1 TABLET BY MOUTH DAILY  ? STIOLTO RESPIMAT 2.5-2.5 MCG/ACT AERS INHALE 1 PUFF INTO THE LUNGS IN THE MORNING AND AT BEDTIME  ? triamcinolone cream (KENALOG) 0.1 % APPLY A THIN LAYER TO THE AFFECTED AREA(S) TWICE A DAY.  ? ZTLIDO 1.8 % PTCH   ? ?No facility-administered encounter medications on file as of 07/29/2021.  ? ? ?Contacted Dorothy Parker for general disease state and medication adherence call.  ? ?Patient is not > 5 days past due for refill on the following medications per chart history: ? ?[facility] Medications: ?Medication Name/mg Last Fill Days Supply ?Quinapril 10mg    05/13/21 90ds ?    02/10/21 90ds ?Rosuvastatin 5mg   06/30/21 90ds ?    04/03/21 90ds ? ? ?What concerns do you have about your medications? Pt denies any concerns  ? ?The patient denies side effects with her  medications.  ? ?How often do you forget or accidentally miss a dose? Never ? ?Do you use a pillbox? No ? ?Are you having any problems getting your medications from your pharmacy? No ? ?Has the cost of your medications been a concern? No ? ?Since last visit with CPP, no interventions have been made:  ? ?The patient has not had an ED visit since last contact.  ? ?The patient denies problems with their health.  ? ?she denies  concerns or questions for [place], at this time.  ? ?Pt stated she never started her nicotine patches  and she did call her Quit line and stated she could never get a live person. She said the recording was on a loop and never could get in touch with someone so she gave her. She stated she has cut back a little from the 1.5 packs a day but not by much.  ? ? ?Care Gaps: ?Last annual wellness visit?10/25/19 ?Colonoscopy: 01/05/10 ?Dexa Scan: 08/12/20 ?Mammogram: 01/12/21 ? ?Elray Mcgregor, CMA ?Clinical Pharmacist Assistant  ?[phone]  ?

## 2021-08-02 DIAGNOSIS — M5090 Cervical disc disorder, unspecified, unspecified cervical region: Secondary | ICD-10-CM | POA: Diagnosis not present

## 2021-08-02 DIAGNOSIS — M546 Pain in thoracic spine: Secondary | ICD-10-CM | POA: Diagnosis not present

## 2021-08-02 DIAGNOSIS — M48061 Spinal stenosis, lumbar region without neurogenic claudication: Secondary | ICD-10-CM | POA: Diagnosis not present

## 2021-08-02 DIAGNOSIS — M461 Sacroiliitis, not elsewhere classified: Secondary | ICD-10-CM | POA: Diagnosis not present

## 2021-08-02 DIAGNOSIS — M5136 Other intervertebral disc degeneration, lumbar region: Secondary | ICD-10-CM | POA: Diagnosis not present

## 2021-08-02 DIAGNOSIS — G894 Chronic pain syndrome: Secondary | ICD-10-CM | POA: Diagnosis not present

## 2021-08-02 DIAGNOSIS — M47816 Spondylosis without myelopathy or radiculopathy, lumbar region: Secondary | ICD-10-CM | POA: Diagnosis not present

## 2021-08-03 NOTE — Telephone Encounter (Signed)
Patient states she called Quitline 2 days in a row and was unable to speak with someone. I called the quitline and got a person after 32 seconds. Patient agreed to try calling again today. Also, patient agreed to cut down to 1PPD starting today (Her sister is visiting from [place]) ?

## 2021-08-06 DIAGNOSIS — H25813 Combined forms of age-related cataract, bilateral: Secondary | ICD-10-CM | POA: Diagnosis not present

## 2021-08-06 DIAGNOSIS — H35373 Puckering of macula, bilateral: Secondary | ICD-10-CM | POA: Diagnosis not present

## 2021-08-06 DIAGNOSIS — H43813 Vitreous degeneration, bilateral: Secondary | ICD-10-CM | POA: Diagnosis not present

## 2021-08-06 DIAGNOSIS — H52223 Regular astigmatism, bilateral: Secondary | ICD-10-CM | POA: Diagnosis not present

## 2021-08-06 DIAGNOSIS — H5203 Hypermetropia, bilateral: Secondary | ICD-10-CM | POA: Diagnosis not present

## 2021-08-06 DIAGNOSIS — H524 Presbyopia: Secondary | ICD-10-CM | POA: Diagnosis not present

## 2021-08-20 ENCOUNTER — Ambulatory Visit (INDEPENDENT_AMBULATORY_CARE_PROVIDER_SITE_OTHER): Payer: Medicare HMO

## 2021-08-20 DIAGNOSIS — E782 Mixed hyperlipidemia: Secondary | ICD-10-CM

## 2021-08-20 DIAGNOSIS — I10 Essential (primary) hypertension: Secondary | ICD-10-CM

## 2021-08-20 DIAGNOSIS — F17219 Nicotine dependence, cigarettes, with unspecified nicotine-induced disorders: Secondary | ICD-10-CM

## 2021-08-20 NOTE — Patient Instructions (Signed)
Visit Information ? ? Goals Addressed   ?None ?  ? ?Patient Care Plan: CCM Pharmacy Care Plan  ?  ? ?Problem Identified: cad, htn, hld, copd   ?Priority: High  ?Onset Date: 08/05/2020  ?  ? ?Long-Range Goal: Disease Management   ?Start Date: 08/05/2020  ?Expected End Date: 08/05/2021  ?Recent Progress: On track  ?Priority: High  ?Note:   ?Current Barriers:  ?Patient is interested in support for smoking cessation but has tried and failed various products in the past ? ? ?Pharmacist Clinical Goal(s):  ?Patient will achieve control of smoking cessation as evidenced by continued use and recommended treatment through collaboration with PharmD and provider.  ? ?Interventions: ?1:1 collaboration with Abigail Miyamoto, MD regarding development and update of comprehensive plan of care as evidenced by provider attestation and co-signature ?Inter-disciplinary care team collaboration (see longitudinal plan of care) ?Comprehensive medication review performed; medication list updated in electronic medical record ? ?Hypertension (BP goal <130/80) ?BP Readings from Last 3 Encounters:  ?04/01/21 124/64  ?12/30/20 124/86  ?12/18/20 132/68  ?-Not ideally controlled ?-Current treatment: ?metoprolol tartrate 25 mg bid Appropriate, Effective, Safe, Accessible ?Quinapril 10 mg daily Appropriate, Effective, Safe, Accessible ?-Medications previously tried: none reported ?-Current home readings:  ?March 2023: Didn't have list ?-Current dietary habits: Lives alone and doesn't cook for herself a lot. Fish and shrimp yesterday. Filet mignon in airfryer on Sunday. Toast streudel, cereal or bacon and eggs for breakfast. Likes fruits and vegetables but hard to use before they go bad.  ?-Current exercise habits: Uses above ground pool at her home during warmer weather. Back pain limits exercise.  ?-Denies hypotensive/hypertensive symptoms ?-Educated on BP goals and benefits of medications for prevention of heart attack, stroke and kidney  damage; ?Daily salt intake goal < 2300 mg; ?Exercise goal of 150 minutes per week; ?Importance of home blood pressure monitoring; ?-Counseled to monitor BP at home occasionally, document, and provide log at future appointments ?-Counseled on diet and exercise extensively ? ?Hyperlipidemia/CAD: (LDL goal < 55) ?The 10-year ASCVD risk score (Arnett DK, et al., 2019) is: 7.5% ?  Values used to calculate the score: ?    Age: 61 years ?    Sex: Female ?    Is Non-Hispanic African American: No ?    Diabetic: No ?    Tobacco smoker: Yes ?    Systolic Blood Pressure: 124 mmHg ?    Is BP treated: Yes ?    HDL Cholesterol: 57 mg/dL ?    Total Cholesterol: 133 mg/dL ?Lab Results  ?Component Value Date  ? CHOL 133 04/08/2021  ? CHOL 139 12/15/2020  ? CHOL 135 06/16/2020  ? ?Lab Results  ?Component Value Date  ? HDL 57 04/08/2021  ? HDL 52 12/15/2020  ? HDL 59 06/16/2020  ? ?Lab Results  ?Component Value Date  ? LDLCALC 57 04/08/2021  ? LDLCALC 56 12/15/2020  ? LDLCALC 55 06/16/2020  ? ?Lab Results  ?Component Value Date  ? TRIG 104 04/08/2021  ? TRIG 193 (H) 12/15/2020  ? TRIG 122 06/16/2020  ? ?Lab Results  ?Component Value Date  ? CHOLHDL 2.3 04/08/2021  ? CHOLHDL 2.7 12/15/2020  ? CHOLHDL 2.3 06/16/2020  ?No results found for: LDLDIRECT ?-Controlled ?-Current treatment: ?aspirin 81 mg ec Appropriate, Effective, Safe, Accessible ?crestor 5 mg daily Appropriate, Effective, Safe, Accessible ?-Medications previously tried: none reported  ?-Current dietary patterns: Drinks 2% milk daily. Rarely eats cheese.  ?-Current exercise habits: limited due to back pain.  ?-  Educated on Cholesterol goals;  ?Benefits of statin for ASCVD risk reduction; ?Importance of limiting foods high in cholesterol; ?Exercise goal of 150 minutes per week; ?-Counseled on diet and exercise extensively ?Recommended to continue current medication ? ?COPD (Goal: control symptoms and prevent exacerbations) ?-Controlled ?-Current treatment  ?Stiolto 1 puff into  the lungs in am and hs Appropriate, Effective, Safe, Accessible ?Albuterol PRN Appropriate, Effective, Safe, Accessible ?-Medications previously tried: none reported  ?No flowsheet data found. ?-Gold Grade: Gold 2 (FEV1 50-79%) ?-Pulmonary function testing: 08/16/2019 - 61% ?-Exacerbations requiring treatment in last 6 months: none reported ?-Patient reports consistent use of maintenance inhaler ?-Frequency of rescue inhaler use: none per patient  ?-Counseled on Benefits of consistent maintenance inhaler use ?-Recommended to continue current medication, unable to do CAT Score today, patient's focus was on smoking cessation. Will have concierge reach out and conduct next month ? ?Osteoporosis / Osteopenia (Goal prevent fractures) ?-Not ideally controlled ?-Last DEXA Scan: 07/2020  ? T-Score femoral neck:  ?07/2020: -3.2 ? T-Score lumbar spine:  ?07/2020: -3.5 ?-Patient is a candidate for pharmacologic treatment due to T-Score < -2.5 in lumbar spine ?-Current treatment  ?Alendronate 70 mg weekly (08/18/20 Start) Appropriate, Effective, Safe, Accessible ?Calcium with D Appropriate, Effective, Safe, Accessible ?-Medications previously tried: n/a  ?-Recommend [phone] units of vitamin D daily. Recommend 1200 mg of calcium daily from dietary and supplemental sources. Recommend weight-bearing and muscle strengthening exercises for building and maintaining bone density. ?-Counseled on diet and exercise extensively ?Educated on calcium and vitamin d recommendations. Patient does drink boost but has very little intake of food due to appetite.  ? ?Tobacco use (Goal smoking cessation ) ?-Uncontrolled -  ?March 2023: 1.5PPD ?September 2022: 2PPD ?October 2022: 1 PPD ?-Previous quit attempts: Chantix - allergic reaction, wellbutrin (Mental confusion), gum - dentures may prohibit chewing and parking ?-Current treatment  ?None reported ?-Patient smokes Within 30 minutes of waking ?-Patient triggers include: watching television, driving  and finishing a meal ?-On a scale of 1-10, reports MOTIVATION to quit is 6 ?-On a scale of 1-10, reports CONFIDENCE in quitting is 6 ?-Provided contact information for [place] [facility] (1-800-QUIT-NOW) and encouraged patient to reach out to this group for support. ?August 2022: Counseled on benefits of smoking cessation. Discussed options of smoking cessation and patient is interested in inhaler. Insurance will not approve inhaler until nasal spray has been tried. Will discuss with patient and then Dr. Marina GoodellPerry if patient is ready to proceed.  ?October 2022: Nicotine nasal spray is causing ADR's (Congestion and sore throat), will ask PCP for Inhaled Nicotine. Patient smoking 1ppd (Down from 2PPD). Goal set for 1/2PPD by Nov 16th ?November 2022: Patient hasn't gotten inhaler and is still smoking 1PPD. New goal is 3/4 PPD by December and I will re-ask PCP for inhaler ?March 2023: Patient will call 1800 Quit-Now after she hangs up. She is interested in quitting, she is up to 1.5PPD and wants to cut back. Will have concierge call monthly to get PPD ?May 2023: Patient got lozenges and patches in the mail. She planned on quitting today but her daughter just got forcibly admitted to a psych hospital after being in jail for drug use. She does not feel like she's able to quit or even think about quitting at this time. Will f/u in 2 months (Seeing PCP next month) ? ? ? ? ?Patient Goals/Self-Care Activities ?Patient will:  ?- take medications as prescribed ?focus on medication adherence by considering a pill box.  ?check  blood pressure weekly, document, and provide at future appointments ?engage in dietary modifications by limiting salt and focusing on heart-healthy diet.  ? ?Follow Up Plan: Telephone follow up appointment with care management team member scheduled for: Pharmacist 10/2021  ? ?Artelia Laroche, Pharm.D. - [phone] ? ?  ? ? ?Ms. Nowaczyk was given information about Chronic Care Management services today including:   ?CCM service includes personalized support from designated clinical staff supervised by her physician, including individualized plan of care and coordination with other care providers ?24/7 contact phone number

## 2021-08-20 NOTE — Progress Notes (Signed)
? ?Chronic Care Management ?Pharmacy Note ? ?08/20/2021 ?Name:  MERCI WALTHERS MRN:  [id number] DOB:  Mar 13, 1961 ? ?Subjective: ?Dorothy Parker is an 61 y.o. year old female who is a primary patient of Henrene Pastor, Zeb Comfort, MD.  The CCM team was consulted for assistance with disease management and care coordination needs.   ? ?Plan Updates:  ? Patient smoking 1.5PPD. Got lozenges/patches NRT in the mail but she would like to hold off on quitting until her daughter is in a better place (See CP for details) ? ? ?Engaged with patient by telephone for follow up visit in response to provider referral for pharmacy case management and/or care coordination services.  ? ?Consent to Services:  ?The patient was given the following information about Chronic Care Management services today, agreed to services, and gave verbal consent: 1. CCM service includes personalized support from designated clinical staff supervised by the primary care provider, including individualized plan of care and coordination with other care providers 2. 24/7 contact phone numbers for assistance for urgent and routine care needs. 3. Service will only be billed when office clinical staff spend 20 minutes or more in a month to coordinate care. 4. Only one practitioner may furnish and bill the service in a calendar month. 5.The patient may stop CCM services at any time (effective at the end of the month) by phone call to the office staff. 6. The patient will be responsible for cost sharing (co-pay) of up to 20% of the service fee (after annual deductible is met). Patient agreed to services and consent obtained. ? ?Patient Care Team: ?Lillard Anes, MD as PCP - General (Family Medicine) ?Flossie Buffy., MD as Referring Physician (Cardiology) ?Dorthula Rue, MD as Referring Physician (Vascular Surgery) ?Duanne Limerick, OD as Consulting Physician (Ophthalmology) ?Lane Hacker, [facility] (Pharmacist) ? ?Recent office visits:   ?07/30/20- order placed for bone density ?  ?06/16/20. Dr. Henrene Pastor PCP, hyperlipidemia, follow up 71mo, no medication changes, CBC normal, kidney and liver tests normal, Cholesterol normal,  CCM referral, follow up 646mo ?  ?Recent consult visits:  ?11/06/2020 - Vascular -  continue antiplatelet and statin. Repeat carotid duplex in 1 year if no symptoms.  ? ?08/18/2020 - cardiology - recommend smoking cessation. Recommend continuing to follow-up with vascular/doppler scan. ?  ?Hospital visits:  ?None in previous 6 months ? ?Objective: ? ?Lab Results  ?Component Value Date  ? CREATININE 0.70 04/08/2021  ? BUN 14 04/08/2021  ? GFRNONAA 97 12/17/2019  ? GFRAA 112 12/17/2019  ? NA 140 04/08/2021  ? K 4.4 04/08/2021  ? CALCIUM 9.2 04/08/2021  ? CO2 24 04/08/2021  ? GLUCOSE 84 04/08/2021  ? ? ?No results found for: HGBA1C, FRUCTOSAMINE, GFR, MICROALBUR  ?Last diabetic Eye exam: No results found for: HMDIABEYEEXA  ?Last diabetic Foot exam: No results found for: HMDIABFOOTEX  ? ?Lab Results  ?Component Value Date  ? CHOL 133 04/08/2021  ? HDL 57 04/08/2021  ? LDHaigler7 04/08/2021  ? TRIG 104 04/08/2021  ? CHOLHDL 2.3 04/08/2021  ? ? ? ?  Latest Ref Rng & Units 04/08/2021  ?  8:53 AM 12/15/2020  ?  9:48 AM 06/16/2020  ?  9:16 AM  ?Hepatic Function  ?Total Protein 6.0 - 8.5 g/dL 6.9   7.1   7.0    ?Albumin 3.8 - 4.8 g/dL 4.8   4.4   4.6    ?AST 0 - 40 IU/L 18   19   24     ?  ALT 0 - 32 IU/L 12   15   19     ?Alk Phosphatase 44 - 121 IU/L 74   74   94    ?Total Bilirubin 0.0 - 1.2 mg/dL 0.5   0.4   0.4    ? ? ?Lab Results  ?Component Value Date/Time  ? TSH 0.378 (L) 04/08/2021 08:53 AM  ? TSH 0.688 12/17/2019 09:34 AM  ? ? ? ?  Latest Ref Rng & Units 04/08/2021  ?  8:53 AM 12/15/2020  ?  9:48 AM 06/16/2020  ?  9:16 AM  ?CBC  ?WBC 3.4 - 10.8 x10E3/uL 8.1   6.7   8.0    ?Hemoglobin 11.1 - 15.9 g/dL 14.6   14.4   14.8    ?Hematocrit 34.0 - 46.6 % 43.4   42.6   43.7    ?Platelets 150 - 450 x10E3/uL 220   207   214    ? ? ?No results found  for: VD25OH ? ?Clinical ASCVD: Yes  ?The 10-year ASCVD risk score (Arnett DK, et al., 2019) is: 7.5% ?  Values used to calculate the score: ?    Age: 43 years ?    Sex: Female ?    Is Non-Hispanic African American: No ?    Diabetic: No ?    Tobacco smoker: Yes ?    Systolic Blood Pressure: 007 mmHg ?    Is BP treated: Yes ?    HDL Cholesterol: 57 mg/dL ?    Total Cholesterol: 133 mg/dL   ? ? ?  12/18/2020  ?  2:49 PM 12/15/2020  ?  9:06 AM 12/15/2020  ?  9:05 AM  ?Depression screen PHQ 2/9  ?Decreased Interest 0 0 0  ?Down, Depressed, Hopeless 0 0 0  ?PHQ - 2 Score 0 0 0  ?Altered sleeping  3   ?Tired, decreased energy  3   ?Change in appetite  2   ?Feeling bad or failure about yourself   0   ?Trouble concentrating  0   ?Moving slowly or fidgety/restless  0   ?Suicidal thoughts  0   ?PHQ-9 Score  8   ?Difficult doing work/chores  Not difficult at all   ?  ? ?Social History  ? ?Tobacco Use  ?Smoking Status Every Day  ? Packs/day: 1.50  ? Years: 45.00  ? Pack years: 67.50  ? Types: Cigarettes  ?Smokeless Tobacco Never  ? ?BP Readings from Last 3 Encounters:  ?04/01/21 124/64  ?12/30/20 124/86  ?12/18/20 132/68  ? ?Pulse Readings from Last 3 Encounters:  ?04/01/21 78  ?12/30/20 68  ?12/18/20 88  ? ?Wt Readings from Last 3 Encounters:  ?04/01/21 101 lb (45.8 kg)  ?12/30/20 98 lb (44.5 kg)  ?12/18/20 98 lb (44.5 kg)  ? ?BMI Readings from Last 3 Encounters:  ?04/01/21 17.34 kg/m?  ?12/30/20 17.36 kg/m?  ?12/18/20 17.36 kg/m?  ? ? ?Assessment/Interventions: Review of patient past medical history, allergies, medications, health status, including review of consultants reports, laboratory and other test data, was performed as part of comprehensive evaluation and provision of chronic care management services.  ? ?SDOH:  (Social Determinants of Health) assessments and interventions performed: Yes ?SDOH Interventions   ? ?Flowsheet Row Most Recent Value  ?SDOH Interventions   ?Financial Strain Interventions Intervention Not  Indicated  ? ?  ? ?SDOH Screenings  ? ?Alcohol Screen: Not on file  ?Depression (PHQ2-9): Low Risk   ? PHQ-2 Score: 0  ?Financial Resource Strain: Low Risk   ?  Difficulty of Paying Living Expenses: Not hard at all  ?Food Insecurity: Not on file  ?Housing: Not on file  ?Physical Activity: Not on file  ?Social Connections: Not on file  ?Stress: Not on file  ?Tobacco Use: High Risk  ? Smoking Tobacco Use: Every Day  ? Smokeless Tobacco Use: Never  ? Passive Exposure: Not on file  ?Transportation Needs: No Transportation Needs  ? Lack of Transportation (Medical): No  ? Lack of Transportation (Non-Medical): No  ? ? ?CCM Care Plan ? ?Allergies  ?Allergen Reactions  ? Bupropion Other (See Comments)  ? Clarithromycin Other (See Comments)  ? Clopidogrel Other (See Comments)  ? Varenicline Other (See Comments)  ? Vitamin D Analogs Rash  ?  High dosage of Vitamin D will cause rash ?High dosage of Vitamin D will cause rash ?High dosage of Vitamin D will cause rash ?  ? Prednisone Other (See Comments)  ?  Hyperglycemia  ? ? ?Medications Reviewed Today   ? ? Reviewed by Lane Hacker, [facility] (Pharmacist) on 08/20/21 at 1141  Med List Status: <None>  ? ?Medication Order Taking? Sig Documenting Provider Last Dose Status Informant  ?alendronate (FOSAMAX) 70 MG tablet [id number]  Take 1 tablet (70 mg total) by mouth every 7 (seven) days. Take with a full glass of water on an empty stomach. Lillard Anes, MD  Active   ?ascorbic acid (VITAMIN C) 500 MG tablet [id number] No Take by mouth. [provider] Taking Active   ?aspirin 81 MG EC tablet [id number] No  [provider] Taking Active   ?glycopyrrolate (ROBINUL) 1 MG tablet [id number]  TAKE TWO TABLETS BY MOUTH TWICE DAILY Lillard Anes, MD  Active   ?metoprolol tartrate (LOPRESSOR) 25 MG tablet [id number]  TAKE 1 TABLET BY MOUTH TWICE A DAY Lillard Anes, MD  Active   ?naloxone [facility]) 4 MG/0.1ML LIQD nasal spray kit [id number] No USE 1  SPRAY IN ONE NOSTRIL. MAY REPEAT IN OTHER NOSTRIL AFTER 2-3 MINS IF NEEDED [provider] Taking Active   ?nitroGLYCERIN (NITROLINGUAL) 0.4 MG/SPRAY spray [id number] No Place under the tongue. Provider, Histo

## 2021-08-22 DIAGNOSIS — M5136 Other intervertebral disc degeneration, lumbar region: Secondary | ICD-10-CM | POA: Diagnosis not present

## 2021-08-22 DIAGNOSIS — G894 Chronic pain syndrome: Secondary | ICD-10-CM | POA: Diagnosis not present

## 2021-08-22 DIAGNOSIS — M5090 Cervical disc disorder, unspecified, unspecified cervical region: Secondary | ICD-10-CM | POA: Diagnosis not present

## 2021-08-22 DIAGNOSIS — M546 Pain in thoracic spine: Secondary | ICD-10-CM | POA: Diagnosis not present

## 2021-08-22 DIAGNOSIS — M48061 Spinal stenosis, lumbar region without neurogenic claudication: Secondary | ICD-10-CM | POA: Diagnosis not present

## 2021-08-22 DIAGNOSIS — M47816 Spondylosis without myelopathy or radiculopathy, lumbar region: Secondary | ICD-10-CM | POA: Diagnosis not present

## 2021-08-22 DIAGNOSIS — M461 Sacroiliitis, not elsewhere classified: Secondary | ICD-10-CM | POA: Diagnosis not present

## 2021-08-25 ENCOUNTER — Other Ambulatory Visit: Payer: Self-pay | Admitting: Legal Medicine

## 2021-08-25 DIAGNOSIS — J449 Chronic obstructive pulmonary disease, unspecified: Secondary | ICD-10-CM

## 2021-09-01 DIAGNOSIS — M48061 Spinal stenosis, lumbar region without neurogenic claudication: Secondary | ICD-10-CM | POA: Diagnosis not present

## 2021-09-01 DIAGNOSIS — M5136 Other intervertebral disc degeneration, lumbar region: Secondary | ICD-10-CM | POA: Diagnosis not present

## 2021-09-01 DIAGNOSIS — M461 Sacroiliitis, not elsewhere classified: Secondary | ICD-10-CM | POA: Diagnosis not present

## 2021-09-01 DIAGNOSIS — G894 Chronic pain syndrome: Secondary | ICD-10-CM | POA: Diagnosis not present

## 2021-09-01 DIAGNOSIS — M5090 Cervical disc disorder, unspecified, unspecified cervical region: Secondary | ICD-10-CM | POA: Diagnosis not present

## 2021-09-01 DIAGNOSIS — M546 Pain in thoracic spine: Secondary | ICD-10-CM | POA: Diagnosis not present

## 2021-09-01 DIAGNOSIS — M47816 Spondylosis without myelopathy or radiculopathy, lumbar region: Secondary | ICD-10-CM | POA: Diagnosis not present

## 2021-09-03 ENCOUNTER — Encounter (INDEPENDENT_AMBULATORY_CARE_PROVIDER_SITE_OTHER): Payer: Self-pay | Admitting: Interventional Cardiology

## 2021-09-03 NOTE — Telephone Encounter (Signed)
Routing to Dr. Amedeo Plenty to review/advise.

## 2021-09-03 NOTE — Telephone Encounter (Signed)
Al Minta Balsam, MD  You 1 hour ago (11:10 AM)       I do see their point. I agree that you might need another echocardiogram before considering this medications. You did have some dynamic obstruction inside the heart chamber which can get worse if heart rate increases with those medications.       Response provided via MyChart message.

## 2021-09-03 NOTE — Telephone Encounter (Signed)
From: Debra Bolton  To: Belal Hurman Horn, MD  Sent: 09/03/2021 9:54 AM PDT  Subject: heart    Dear Dr. Leonia Corona,  I am messaging you to ask a question about my heart, I was a patient of yours in [place]. I have moved to [place] and my Doctors are at [facility]. They recently were going to prescribe methylphenidate for fatigue for the MS, but hesitated because of concerns for my heart abnormality. I thought everything checked out ok when I saw you, but wanted to double check and make sure you agree with me. Please let me know what you think.  Thank you for your help!  Debra Bolton  MRN [id number]

## 2021-09-07 DIAGNOSIS — Z1389 Encounter for screening for other disorder: Secondary | ICD-10-CM | POA: Diagnosis not present

## 2021-09-07 DIAGNOSIS — G894 Chronic pain syndrome: Secondary | ICD-10-CM | POA: Diagnosis not present

## 2021-09-07 DIAGNOSIS — M5136 Other intervertebral disc degeneration, lumbar region: Secondary | ICD-10-CM | POA: Diagnosis not present

## 2021-09-07 DIAGNOSIS — M461 Sacroiliitis, not elsewhere classified: Secondary | ICD-10-CM | POA: Diagnosis not present

## 2021-09-07 DIAGNOSIS — Z79891 Long term (current) use of opiate analgesic: Secondary | ICD-10-CM | POA: Diagnosis not present

## 2021-09-07 DIAGNOSIS — M47816 Spondylosis without myelopathy or radiculopathy, lumbar region: Secondary | ICD-10-CM | POA: Diagnosis not present

## 2021-09-07 DIAGNOSIS — M48061 Spinal stenosis, lumbar region without neurogenic claudication: Secondary | ICD-10-CM | POA: Diagnosis not present

## 2021-09-13 ENCOUNTER — Telehealth (INDEPENDENT_AMBULATORY_CARE_PROVIDER_SITE_OTHER): Payer: Self-pay | Admitting: Family Practice

## 2021-09-13 DIAGNOSIS — E039 Hypothyroidism, unspecified: Secondary | ICD-10-CM

## 2021-09-15 MED ORDER — THYROID 15 MG OR TABS
15.00 mg | ORAL_TABLET | ORAL | 0 refills | Status: AC
Start: 2021-09-15 — End: ?

## 2021-09-15 MED ORDER — THYROID 60 MG OR TABS
60.0000 mg | ORAL_TABLET | Freq: Every day | ORAL | 0 refills | Status: AC
Start: 2021-09-15 — End: ?

## 2021-09-15 NOTE — Telephone Encounter (Signed)
Scheduling Request:   [place] [facility]     Please remind pt to schedule f/u appt w Dr Rana Snare, Rina S, for annual CPE and labs (video visit if appropriate), was due on or after 10/11/2021.     Authorized 90 days supply + 0 RF until f/u scheduled.         Thanks,    [facility]   Refill and Prior [employer]  Phone:  [phone]  Ext:  [phone]

## 2021-09-15 NOTE — Telephone Encounter (Signed)
*  MEDS FLAGGED FOR REMOVAL*      Added 60 mg med sync.          Melisa Sharen Counter, CPhT  (Rx Refill and [facility])      Hypothyroid Medication Refill Protocol    Last visit in enc specialty: 12/11/2020 - No Show?    Recent Visits in This Encounter Department     Date Provider Department Visit Type Primary Dx    10/01/2020 Edi, Verl Blalock, MD [place] [facility] Home Health Telemedicine Chronic right-sided low back pain without sciatica    07/23/2020 Edi, Verl Blalock, MD [place] [facility] Home Health Telemedicine Hypothyroidism, unspecified type    06/16/2020 Edi, Verl Blalock, MD [place] [facility] Home Health Telemedicine Short of breath on exertion    05/12/2020 Edi, Verl Blalock, MD [place] [facility] Home Health Telemedicine Inadequate exercise - not at goal    03/28/2020 Edi, Rina S, MD [place] [facility] Encounter for screening mammogram for malignant neoplasm of breast         Population Health Visits  Recent [facility] Visits    None       Next f/u appt due:  Return in about 3 months (around 01/01/2021) for f/u SOB, back pain, MS, chronic issues 40 min.  Next appt in enc specialty: Visit date not found      Future Appointments 09/15/2021 - 09/14/2026      Date Visit Type Department Provider     10/28/2021 11:30 AM [facility] - Neurology - [place], Tillman Sers, MD    Appointment Notes:     6 month follow up                        **If no recent dose changes, and last TSH (and T4 for Liothyronine only) normal/within date, no recent notes will be reviewed per Pharmacy Dept protocol**      LABS required:  (Q year TSH)  *If recent dose change: check TSH 2 months after starting new  dose.    Lab Results   Component Value Date    TSH 0.97 07/24/2020    FREET4 0.85 (L) 07/24/2020         Monitoring required:  (Q year BP, HR, Wt)   *If pt is pregnant, DEFER TO MD*    (BP  range: ZRAQTMAU=63-335  KTGYBWLSL=37-34)  Blood Pressure   06/30/21 111/72   04/29/21 123/78   12/12/20 130/80       (HR range: 55-110)   Pulse Readings from Last 3 Encounters:   06/30/21 85   04/29/21 78   12/12/20 86       Wt Readings from Last 3 Encounters:   06/30/21 63 kg (139 lb)   04/29/21 63 kg (139 lb)   12/12/20 65 kg (143 lb 4.8 oz)         Last Digital Health Monitoring Vitals:        Last MyChart BP Values:        Last Pt Entered MyChart BP Values:

## 2021-09-15 NOTE — Telephone Encounter (Signed)
Prazosin flagged for removal by pt, not removed by RPh - Managed by specialist

## 2021-09-16 DIAGNOSIS — J449 Chronic obstructive pulmonary disease, unspecified: Secondary | ICD-10-CM

## 2021-09-16 DIAGNOSIS — I2510 Atherosclerotic heart disease of native coronary artery without angina pectoris: Secondary | ICD-10-CM

## 2021-09-16 DIAGNOSIS — M8580 Other specified disorders of bone density and structure, unspecified site: Secondary | ICD-10-CM

## 2021-09-16 DIAGNOSIS — Z951 Presence of aortocoronary bypass graft: Secondary | ICD-10-CM | POA: Diagnosis not present

## 2021-09-16 DIAGNOSIS — I10 Essential (primary) hypertension: Secondary | ICD-10-CM

## 2021-09-16 DIAGNOSIS — E785 Hyperlipidemia, unspecified: Secondary | ICD-10-CM

## 2021-09-16 DIAGNOSIS — F17200 Nicotine dependence, unspecified, uncomplicated: Secondary | ICD-10-CM | POA: Diagnosis not present

## 2021-09-16 DIAGNOSIS — F17210 Nicotine dependence, cigarettes, uncomplicated: Secondary | ICD-10-CM | POA: Diagnosis not present

## 2021-09-16 DIAGNOSIS — I6523 Occlusion and stenosis of bilateral carotid arteries: Secondary | ICD-10-CM | POA: Diagnosis not present

## 2021-09-16 DIAGNOSIS — R002 Palpitations: Secondary | ICD-10-CM | POA: Diagnosis not present

## 2021-09-21 NOTE — Telephone Encounter (Signed)
Called patient left a message to contact Dr Edi office please assist scheduling a CPE after 10/11/2021.

## 2021-09-22 ENCOUNTER — Other Ambulatory Visit (INDEPENDENT_AMBULATORY_CARE_PROVIDER_SITE_OTHER): Payer: Self-pay | Admitting: Nurse Practitioner

## 2021-09-22 ENCOUNTER — Other Ambulatory Visit: Payer: Self-pay | Admitting: Legal Medicine

## 2021-09-22 DIAGNOSIS — M48061 Spinal stenosis, lumbar region without neurogenic claudication: Secondary | ICD-10-CM | POA: Diagnosis not present

## 2021-09-22 DIAGNOSIS — M47816 Spondylosis without myelopathy or radiculopathy, lumbar region: Secondary | ICD-10-CM | POA: Diagnosis not present

## 2021-09-22 DIAGNOSIS — M5090 Cervical disc disorder, unspecified, unspecified cervical region: Secondary | ICD-10-CM | POA: Diagnosis not present

## 2021-09-22 DIAGNOSIS — G894 Chronic pain syndrome: Secondary | ICD-10-CM | POA: Diagnosis not present

## 2021-09-22 DIAGNOSIS — M5136 Other intervertebral disc degeneration, lumbar region: Secondary | ICD-10-CM | POA: Diagnosis not present

## 2021-09-22 DIAGNOSIS — M546 Pain in thoracic spine: Secondary | ICD-10-CM | POA: Diagnosis not present

## 2021-09-22 DIAGNOSIS — M461 Sacroiliitis, not elsewhere classified: Secondary | ICD-10-CM | POA: Diagnosis not present

## 2021-09-22 DIAGNOSIS — F4310 Post-traumatic stress disorder, unspecified: Secondary | ICD-10-CM

## 2021-09-22 NOTE — Telephone Encounter (Signed)
Refill request from pharmacy for,   Requested Prescriptions     Pending Prescriptions Disp Refills   . buPROPion (WELLBUTRIN) 300 MG Extended-Release tablet [Pharmacy Med Name: BUPROPION HCL XL 300 MG TABLET] 90 tablet 0     Sig: TAKE 1 TABLET BY MOUTH EVERY MORNING          Last visit:  02/26/21  No follow up visit as of today. Sent patient message to schedule appt.           [id number] [place]

## 2021-09-23 ENCOUNTER — Other Ambulatory Visit: Payer: Self-pay | Admitting: Legal Medicine

## 2021-09-24 DIAGNOSIS — Z951 Presence of aortocoronary bypass graft: Secondary | ICD-10-CM | POA: Diagnosis not present

## 2021-09-24 DIAGNOSIS — I6523 Occlusion and stenosis of bilateral carotid arteries: Secondary | ICD-10-CM | POA: Diagnosis not present

## 2021-09-24 DIAGNOSIS — E785 Hyperlipidemia, unspecified: Secondary | ICD-10-CM | POA: Diagnosis not present

## 2021-09-24 DIAGNOSIS — I10 Essential (primary) hypertension: Secondary | ICD-10-CM | POA: Diagnosis not present

## 2021-09-24 DIAGNOSIS — Z720 Tobacco use: Secondary | ICD-10-CM | POA: Diagnosis not present

## 2021-10-02 DIAGNOSIS — M48061 Spinal stenosis, lumbar region without neurogenic claudication: Secondary | ICD-10-CM | POA: Diagnosis not present

## 2021-10-02 DIAGNOSIS — M546 Pain in thoracic spine: Secondary | ICD-10-CM | POA: Diagnosis not present

## 2021-10-02 DIAGNOSIS — M5090 Cervical disc disorder, unspecified, unspecified cervical region: Secondary | ICD-10-CM | POA: Diagnosis not present

## 2021-10-02 DIAGNOSIS — M5136 Other intervertebral disc degeneration, lumbar region: Secondary | ICD-10-CM | POA: Diagnosis not present

## 2021-10-02 DIAGNOSIS — M47816 Spondylosis without myelopathy or radiculopathy, lumbar region: Secondary | ICD-10-CM | POA: Diagnosis not present

## 2021-10-02 DIAGNOSIS — G894 Chronic pain syndrome: Secondary | ICD-10-CM | POA: Diagnosis not present

## 2021-10-02 DIAGNOSIS — M461 Sacroiliitis, not elsewhere classified: Secondary | ICD-10-CM | POA: Diagnosis not present

## 2021-10-05 DIAGNOSIS — G894 Chronic pain syndrome: Secondary | ICD-10-CM | POA: Diagnosis not present

## 2021-10-05 DIAGNOSIS — M461 Sacroiliitis, not elsewhere classified: Secondary | ICD-10-CM | POA: Diagnosis not present

## 2021-10-05 DIAGNOSIS — Z1389 Encounter for screening for other disorder: Secondary | ICD-10-CM | POA: Diagnosis not present

## 2021-10-05 DIAGNOSIS — M5136 Other intervertebral disc degeneration, lumbar region: Secondary | ICD-10-CM | POA: Diagnosis not present

## 2021-10-05 DIAGNOSIS — M48061 Spinal stenosis, lumbar region without neurogenic claudication: Secondary | ICD-10-CM | POA: Diagnosis not present

## 2021-10-05 DIAGNOSIS — M47816 Spondylosis without myelopathy or radiculopathy, lumbar region: Secondary | ICD-10-CM | POA: Diagnosis not present

## 2021-10-05 DIAGNOSIS — Z79891 Long term (current) use of opiate analgesic: Secondary | ICD-10-CM | POA: Diagnosis not present
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
## 2021-10-06 ENCOUNTER — Ambulatory Visit (INDEPENDENT_AMBULATORY_CARE_PROVIDER_SITE_OTHER): Payer: Medicare HMO | Admitting: Legal Medicine

## 2021-10-06 ENCOUNTER — Encounter: Payer: Self-pay | Admitting: Legal Medicine

## 2021-10-06 VITALS — BP 160/80 | HR 45 | Temp 97.6°F | Resp 15 | Ht 64.0 in | Wt 102.0 lb

## 2021-10-06 DIAGNOSIS — M810 Age-related osteoporosis without current pathological fracture: Secondary | ICD-10-CM

## 2021-10-06 DIAGNOSIS — E440 Moderate protein-calorie malnutrition: Secondary | ICD-10-CM

## 2021-10-06 DIAGNOSIS — Z681 Body mass index (BMI) 19 or less, adult: Secondary | ICD-10-CM | POA: Diagnosis not present

## 2021-10-06 DIAGNOSIS — I6523 Occlusion and stenosis of bilateral carotid arteries: Secondary | ICD-10-CM

## 2021-10-06 DIAGNOSIS — I10 Essential (primary) hypertension: Secondary | ICD-10-CM

## 2021-10-06 DIAGNOSIS — F17200 Nicotine dependence, unspecified, uncomplicated: Secondary | ICD-10-CM | POA: Diagnosis not present

## 2021-10-06 DIAGNOSIS — F321 Major depressive disorder, single episode, moderate: Secondary | ICD-10-CM | POA: Diagnosis not present

## 2021-10-06 DIAGNOSIS — E782 Mixed hyperlipidemia: Secondary | ICD-10-CM | POA: Diagnosis not present

## 2021-10-06 DIAGNOSIS — I2510 Atherosclerotic heart disease of native coronary artery without angina pectoris: Secondary | ICD-10-CM

## 2021-10-06 DIAGNOSIS — J449 Chronic obstructive pulmonary disease, unspecified: Secondary | ICD-10-CM

## 2021-10-07 LAB — CBC WITH DIFFERENTIAL/PLATELET
Basophils Absolute: 0 10*3/uL (ref 0.0–0.2)
Basos: 1 %
EOS (ABSOLUTE): 0.1 10*3/uL (ref 0.0–0.4)
Eos: 2 %
Hematocrit: 39.9 % (ref 34.0–46.6)
Hemoglobin: 13.7 g/dL (ref 11.1–15.9)
Immature Grans (Abs): 0 10*3/uL (ref 0.0–0.1)
Immature Granulocytes: 0 %
Lymphocytes Absolute: 2.1 10*3/uL (ref 0.7–3.1)
Lymphs: 33 %
MCH: 32.6 pg (ref 26.6–33.0)
MCHC: 34.3 g/dL (ref 31.5–35.7)
MCV: 95 fL (ref 79–97)
Monocytes Absolute: 0.5 10*3/uL (ref 0.1–0.9)
Monocytes: 7 %
Neutrophils Absolute: 3.6 10*3/uL (ref 1.4–7.0)
Neutrophils: 57 %
Platelets: 196 10*3/uL (ref 150–450)
RBC: 4.2 x10E6/uL (ref 3.77–5.28)
RDW: 12.3 % (ref 11.7–15.4)
WBC: 6.2 10*3/uL (ref 3.4–10.8)

## 2021-10-07 LAB — COMPREHENSIVE METABOLIC PANEL
ALT: 14 IU/L (ref 0–32)
AST: 19 IU/L (ref 0–40)
Albumin/Globulin Ratio: 1.6 (ref 1.2–2.2)
Albumin: 4.4 g/dL (ref 3.8–4.8)
Alkaline Phosphatase: 82 IU/L (ref 44–121)
BUN/Creatinine Ratio: 31 — ABNORMAL HIGH (ref 12–28)
BUN: 19 mg/dL (ref 8–27)
Bilirubin Total: 0.2 mg/dL (ref 0.0–1.2)
CO2: 21 mmol/L (ref 20–29)
Calcium: 9.4 mg/dL (ref 8.7–10.3)
Chloride: 104 mmol/L (ref 96–106)
Creatinine, Ser: 0.61 mg/dL (ref 0.57–1.00)
Globulin, Total: 2.7 g/dL (ref 1.5–4.5)
Glucose: 94 mg/dL (ref 70–99)
Potassium: 4.6 mmol/L (ref 3.5–5.2)
Sodium: 142 mmol/L (ref 134–144)
Total Protein: 7.1 g/dL (ref 6.0–8.5)
eGFR: 101 mL/min/{1.73_m2} (ref >59)

## 2021-10-07 LAB — LIPID PANEL
Chol/HDL Ratio: 2.6 ratio (ref 0.0–4.4)
Cholesterol, Total: 159 mg/dL (ref 100–199)
HDL: 62 mg/dL (ref >39)
LDL Chol Calc (NIH): 76 mg/dL (ref 0–99)
Triglycerides: 120 mg/dL (ref 0–149)
VLDL Cholesterol Cal: 21 mg/dL (ref 5–40)

## 2021-10-07 LAB — CARDIOVASCULAR RISK ASSESSMENT

## 2021-10-07 NOTE — Progress Notes (Signed)
Kidney and liver tests normal, Cholesterol normal, CBC normal,  lp

## 2021-10-21 ENCOUNTER — Other Ambulatory Visit: Payer: Self-pay | Admitting: Legal Medicine

## 2021-10-21 DIAGNOSIS — J449 Chronic obstructive pulmonary disease, unspecified: Secondary | ICD-10-CM

## 2021-10-22 DIAGNOSIS — M546 Pain in thoracic spine: Secondary | ICD-10-CM | POA: Diagnosis not present

## 2021-10-22 DIAGNOSIS — M5136 Other intervertebral disc degeneration, lumbar region: Secondary | ICD-10-CM | POA: Diagnosis not present

## 2021-10-22 DIAGNOSIS — M461 Sacroiliitis, not elsewhere classified: Secondary | ICD-10-CM | POA: Diagnosis not present

## 2021-10-22 DIAGNOSIS — M5090 Cervical disc disorder, unspecified, unspecified cervical region: Secondary | ICD-10-CM | POA: Diagnosis not present

## 2021-10-22 DIAGNOSIS — M48061 Spinal stenosis, lumbar region without neurogenic claudication: Secondary | ICD-10-CM | POA: Diagnosis not present

## 2021-10-22 DIAGNOSIS — G894 Chronic pain syndrome: Secondary | ICD-10-CM | POA: Diagnosis not present

## 2021-10-22 DIAGNOSIS — M47816 Spondylosis without myelopathy or radiculopathy, lumbar region: Secondary | ICD-10-CM | POA: Diagnosis not present

## 2021-10-23 ENCOUNTER — Other Ambulatory Visit: Payer: Self-pay | Admitting: Legal Medicine

## 2021-10-28 ENCOUNTER — Encounter (INDEPENDENT_AMBULATORY_CARE_PROVIDER_SITE_OTHER): Payer: Medicare Other | Admitting: Neurology

## 2021-11-01 DIAGNOSIS — M461 Sacroiliitis, not elsewhere classified: Secondary | ICD-10-CM | POA: Diagnosis not present

## 2021-11-01 DIAGNOSIS — M48061 Spinal stenosis, lumbar region without neurogenic claudication: Secondary | ICD-10-CM | POA: Diagnosis not present

## 2021-11-01 DIAGNOSIS — M47816 Spondylosis without myelopathy or radiculopathy, lumbar region: Secondary | ICD-10-CM | POA: Diagnosis not present

## 2021-11-01 DIAGNOSIS — M546 Pain in thoracic spine: Secondary | ICD-10-CM | POA: Diagnosis not present

## 2021-11-01 DIAGNOSIS — G894 Chronic pain syndrome: Secondary | ICD-10-CM | POA: Diagnosis not present

## 2021-11-03 ENCOUNTER — Ambulatory Visit (INDEPENDENT_AMBULATORY_CARE_PROVIDER_SITE_OTHER): Payer: Medicare HMO

## 2021-11-03 NOTE — Patient Instructions (Signed)
Visit Information   Goals Addressed   None    Patient Care Plan: CCM Pharmacy Care Plan     Problem Identified: cad, htn, hld, copd   Priority: High  Onset Date: 08/05/2020     Long-Range Goal: Disease Management   Start Date: 08/05/2020  Expected End Date: 08/05/2021  Recent Progress: On track  Priority: High  Note:   Current Barriers:  Patient is interested in support for smoking cessation but has tried and failed various products in the past   Pharmacist Clinical Goal(s):  Patient will achieve control of smoking cessation as evidenced by continued use and recommended treatment through collaboration with PharmD and provider.   Interventions: 1:1 collaboration with Abigail Miyamoto, MD regarding development and update of comprehensive plan of care as evidenced by provider attestation and co-signature Inter-disciplinary care team collaboration (see longitudinal plan of care) Comprehensive medication review performed; medication list updated in electronic medical record  Hypertension (BP goal <130/80) BP Readings from Last 3 Encounters:  10/06/21 (!) 160/80  04/01/21 124/64  12/30/20 124/86  -Not ideally controlled -Current treatment: metoprolol tartrate 25 mg bid Appropriate, Effective, Safe, Accessible Valsartan Appropriate, Effective, Query Safe,  -Medications previously tried: Quinapril (Cost) -Current home readings:  March 2023: Didn't have list -Current dietary habits: Lives alone and doesn't cook for herself a lot. Fish and shrimp yesterday. Filet mignon in airfryer on Sunday. Toast streudel, cereal or bacon and eggs for breakfast. Likes fruits and vegetables but hard to use before they go bad.  -Current exercise habits: Uses above ground pool at her home during warmer weather. Back pain limits exercise.  -Denies hypotensive/hypertensive symptoms -Educated on BP goals and benefits of medications for prevention of heart attack, stroke and kidney damage; Daily  salt intake goal < 2300 mg; Exercise goal of 150 minutes per week; Importance of home blood pressure monitoring; -Counseled to monitor BP at home occasionally, document, and provide log at future appointments -Counseled on diet and exercise extensively July 2023: Patient having ADR's on Valsartan (Dizzy and confused). Will ask PCP to change to Lisinopril (Quinapril worked but cost too much)  Hyperlipidemia/CAD: (LDL goal < 55) The 10-year ASCVD risk score (Arnett DK, et al., 2019) is: 13.2%   Values used to calculate the score:     Age: 61 years     Sex: Female     Is Non-Hispanic African American: No     Diabetic: No     Tobacco smoker: Yes     Systolic Blood Pressure: 160 mmHg     Is BP treated: Yes     HDL Cholesterol: 62 mg/dL     Total Cholesterol: 159 mg/dL Lab Results  Component Value Date   CHOL 159 10/06/2021   CHOL 133 04/08/2021   CHOL 139 12/15/2020   Lab Results  Component Value Date   HDL 62 10/06/2021   HDL 57 04/08/2021   HDL 52 12/15/2020   Lab Results  Component Value Date   LDLCALC 76 10/06/2021   LDLCALC 57 04/08/2021   LDLCALC 56 12/15/2020   Lab Results  Component Value Date   TRIG 120 10/06/2021   TRIG 104 04/08/2021   TRIG 193 (H) 12/15/2020   Lab Results  Component Value Date   CHOLHDL 2.6 10/06/2021   CHOLHDL 2.3 04/08/2021   CHOLHDL 2.7 12/15/2020  No results found for: "LDLDIRECT" -Controlled -Current treatment: aspirin 81 mg ec Appropriate, Effective, Safe, Accessible crestor 5 mg daily Appropriate, Effective, Safe, Accessible -Medications previously tried: none  reported  -Current dietary patterns: Drinks 2% milk daily. Rarely eats cheese.  -Current exercise habits: limited due to back pain.  -Educated on Cholesterol goals;  Benefits of statin for ASCVD risk reduction; Importance of limiting foods high in cholesterol; Exercise goal of 150 minutes per week; -Counseled on diet and exercise extensively Recommended to continue  current medication  COPD (Goal: control symptoms and prevent exacerbations) -Controlled -Current treatment  Stiolto 1 puff into the lungs in am and hs Appropriate, Effective, Safe, Accessible Albuterol PRN Appropriate, Effective, Safe, Accessible -Medications previously tried: none reported      No data to display        -Gold Grade: Gold 2 (FEV1 50-79%) -Pulmonary function testing: 08/16/2019 - 61% -Exacerbations requiring treatment in last 6 months: none reported -Patient reports consistent use of maintenance inhaler -Frequency of rescue inhaler use: none per patient  -Counseled on Benefits of consistent maintenance inhaler use July 2023: Patient not interested in quitting  Osteoporosis / Osteopenia (Goal prevent fractures) -Not ideally controlled -Last DEXA Scan: 07/2020   T-Score femoral neck:  07/2020: -3.2  T-Score lumbar spine:  07/2020: -3.5 -Patient is a candidate for pharmacologic treatment due to T-Score < -2.5 in lumbar spine -Current treatment  Alendronate 70 mg weekly (08/18/20 Start) Appropriate, Effective, Safe, Accessible Calcium with D Appropriate, Effective, Safe, Accessible -Medications previously tried: n/a  -Recommend [phone] units of vitamin D daily. Recommend 1200 mg of calcium daily from dietary and supplemental sources. Recommend weight-bearing and muscle strengthening exercises for building and maintaining bone density. -Counseled on diet and exercise extensively Educated on calcium and vitamin d recommendations. Patient does drink boost but has very little intake of food due to appetite.   Tobacco use (Goal smoking cessation ) -Uncontrolled -   July 2023: 1.5PPD March 2023: 1.5PPD September 2022: 2PPD October 2022: 1 PPD -Previous quit attempts: Chantix - allergic reaction, wellbutrin (Mental confusion), gum - dentures may prohibit chewing and parking -Current treatment  None reported -Patient smokes Within 30 minutes of waking -Patient triggers  include: watching television, driving and finishing a meal -On a scale of 1-10, reports MOTIVATION to quit is 6 -On a scale of 1-10, reports CONFIDENCE in quitting is 6 -Provided contact information for [place] [facility]uit [facility] (1-800-QUIT-NOW) and encouraged patient to reach out to this group for support. August 2022: Counseled on benefits of smoking cessation. Discussed options of smoking cessation and patient is interested in inhaler. Insurance will not approve inhaler until nasal spray has been tried. Will discuss with patient and then Dr. Marina Goodell if patient is ready to proceed.  October 2022: Nicotine nasal spray is causing ADR's (Congestion and sore throat), will ask PCP for Inhaled Nicotine. Patient smoking 1ppd (Down from 2PPD). Goal set for 1/2PPD by Nov 16th November 2022: Patient hasn't gotten inhaler and is still smoking 1PPD. New goal is 3/4 PPD by December and I will re-ask PCP for inhaler March 2023: Patient will call 1800 Quit-Now after she hangs up. She is interested in quitting, she is up to 1.5PPD and wants to cut back. Will have concierge call monthly to get PPD May 2023: Patient got lozenges and patches in the mail. She planned on quitting today but her daughter just got forcibly admitted to a psych hospital after being in jail for drug use. She does not feel like she's able to quit or even think about quitting at this time. Will f/u in 2 months (Seeing PCP next month) July 2023: Patient is in the Contemplation phase  but is not ready to quit yet. Counseled on benefits but she said, "I've got too much stress right now." I told her, "There's never going to be a good time to quit, life is full of moments of stress" to which she agreed but stated she wasn't ready to quit     Patient Goals/Self-Care Activities Patient will:  - take medications as prescribed focus on medication adherence by considering a pill box.  check blood pressure weekly, document, and provide at future appointments engage  in dietary modifications by limiting salt and focusing on heart-healthy diet.   Follow Up Plan: Telephone follow up appointment with care management team member scheduled for: PRN  Artelia Laroche, Pharm.D. - [phone]      Dorothy Parker was given information about Chronic Care Management services today including:  CCM service includes personalized support from designated clinical staff supervised by her physician, including individualized plan of care and coordination with other care providers 24/7 contact phone numbers for assistance for urgent and routine care needs. Standard insurance, coinsurance, copays and deductibles apply for chronic care management only during months in which we provide at least 20 minutes of these services. Most insurances cover these services at 100%, however patients may be responsible for any copay, coinsurance and/or deductible if applicable. This service may help you avoid the need for more expensive face-to-face services. Only one practitioner may furnish and bill the service in a calendar month. The patient may stop CCM services at any time (effective at the end of the month) by phone call to the office staff.  Patient agreed to services and verbal consent obtained.   The patient verbalized understanding of instructions, educational materials, and care plan provided today and DECLINED offer to receive copy of patient instructions, educational materials, and care plan.  Patient will reach out to CCM PRN  Dorothy Parker, [facility] [phone]

## 2021-11-03 NOTE — Progress Notes (Signed)
Chronic Care Management Pharmacy Note  11/03/2021 Name:  Dorothy Parker MRN:  [id number] DOB:  08-05-1960  Subjective: Dorothy Parker is an 61 y.o. year old female who is a primary patient of Henrene Pastor, Zeb Comfort, MD.  The CCM team was consulted for assistance with disease management and care coordination needs.    Plan Updates:   Patient smoking 1.5PPD. Got lozenges/patches NRT in the mail but she would like to hold off on quitting until her daughter is in a better place (See CP for details)   Engaged with patient by telephone for follow up visit in response to provider referral for pharmacy case management and/or care coordination services.   Consent to Services:  The patient was given the following information about Chronic Care Management services today, agreed to services, and gave verbal consent: 1. CCM service includes personalized support from designated clinical staff supervised by the primary care provider, including individualized plan of care and coordination with other care providers 2. 24/7 contact phone numbers for assistance for urgent and routine care needs. 3. Service will only be billed when office clinical staff spend 20 minutes or more in a month to coordinate care. 4. Only one practitioner may furnish and bill the service in a calendar month. 5.The patient may stop CCM services at any time (effective at the end of the month) by phone call to the office staff. 6. The patient will be responsible for cost sharing (co-pay) of up to 20% of the service fee (after annual deductible is met). Patient agreed to services and consent obtained.  Patient Care Team: Lillard Anes, MD as PCP - General (Family Medicine) Flossie Buffy., MD as Referring Physician (Cardiology) Dorthula Rue, MD as Referring Physician (Vascular Surgery) Duanne Limerick, OD as Consulting Physician (Ophthalmology) Lane Hacker, [facility] (Pharmacist)  Recent office visits:   07/30/20- order placed for bone density   06/16/20. Dr. Henrene Pastor PCP, hyperlipidemia, follow up 59mo, no medication changes, CBC normal, kidney and liver tests normal, Cholesterol normal,  CCM referral, follow up 654mo   Recent consult visits:  11/06/2020 - Vascular -  continue antiplatelet and statin. Repeat carotid duplex in 1 year if no symptoms.   08/18/2020 - cardiology - recommend smoking cessation. Recommend continuing to follow-up with vascular/doppler scan.   Hospital visits:  None in previous 6 months  Objective:  Lab Results  Component Value Date   CREATININE 0.61 10/06/2021   BUN 19 10/06/2021   GFRNONAA 97 12/17/2019   GFRAA 112 12/17/2019   NA 142 10/06/2021   K 4.6 10/06/2021   CALCIUM 9.4 10/06/2021   CO2 21 10/06/2021   GLUCOSE 94 10/06/2021    No results found for: "HGBA1C", "FRUCTOSAMINE", "GFR", "MICROALBUR"  Last diabetic Eye exam: No results found for: "HMDIABEYEEXA"  Last diabetic Foot exam: No results found for: "HMDIABFOOTEX"   Lab Results  Component Value Date   CHOL 159 10/06/2021   HDL 62 10/06/2021   LDLCALC 76 10/06/2021   TRIG 120 10/06/2021   CHOLHDL 2.6 10/06/2021       Latest Ref Rng & Units 10/06/2021    9:06 AM 04/08/2021    8:53 AM 12/15/2020    9:48 AM  Hepatic Function  Total Protein 6.0 - 8.5 g/dL 7.1  6.9  7.1   Albumin 3.8 - 4.8 g/dL 4.4  4.8  4.4   AST 0 - 40 IU/L _0 ALT 0 - 32 IU/L 14  12  15   Alk Phosphatase 44 - 121 IU/L 82  74  74   Total Bilirubin 0.0 - 1.2 mg/dL 0.2  0.5  0.4     Lab Results  Component Value Date/Time   TSH 0.378 (L) 04/08/2021 08:53 AM   TSH 0.688 12/17/2019 09:34 AM       Latest Ref Rng & Units 10/06/2021    9:06 AM 04/08/2021    8:53 AM 12/15/2020    9:48 AM  CBC  WBC 3.4 - 10.8 x10E3/uL 6.2  8.1  6.7   Hemoglobin 11.1 - 15.9 g/dL 13.7  14.6  14.4   Hematocrit 34.0 - 46.6 % 39.9  43.4  42.6   Platelets 150 - 450 x10E3/uL 196  220  207     No results found for:  "VD25OH"  Clinical ASCVD: Yes  The 10-year ASCVD risk score (Arnett DK, et al., 2019) is: 13.2%   Values used to calculate the score:     Age: 43 years     Sex: Female     Is Non-Hispanic African American: No     Diabetic: No     Tobacco smoker: Yes     Systolic Blood Pressure: 063 mmHg     Is BP treated: Yes     HDL Cholesterol: 62 mg/dL     Total Cholesterol: 159 mg/dL       10/06/2021    8:39 AM 12/18/2020    2:49 PM 12/15/2020    9:06 AM  Depression screen PHQ 2/9  Decreased Interest 1 0 0  Down, Depressed, Hopeless 1 0 0  PHQ - 2 Score 2 0 0  Altered sleeping 1  3  Tired, decreased energy 2  3  Change in appetite 2  2  Feeling bad or failure about yourself  0  0  Trouble concentrating 0  0  Moving slowly or fidgety/restless 0  0  Suicidal thoughts 0  0  PHQ-9 Score 7  8  Difficult doing work/chores Somewhat difficult  Not difficult at all     Social History   Tobacco Use  Smoking Status Every Day   Packs/day: 1.50   Years: 45.00   Total pack years: 67.50   Types: Cigarettes  Smokeless Tobacco Never   BP Readings from Last 3 Encounters:  10/06/21 (!) 160/80  04/01/21 124/64  12/30/20 124/86   Pulse Readings from Last 3 Encounters:  10/06/21 (!) 45  04/01/21 78  12/30/20 68   Wt Readings from Last 3 Encounters:  10/06/21 102 lb (46.3 kg)  04/01/21 101 lb (45.8 kg)  12/30/20 98 lb (44.5 kg)   BMI Readings from Last 3 Encounters:  10/06/21 17.51 kg/m  04/01/21 17.34 kg/m  12/30/20 17.36 kg/m    Assessment/Interventions: Review of patient past medical history, allergies, medications, health status, including review of consultants reports, laboratory and other test data, was performed as part of comprehensive evaluation and provision of chronic care management services.   SDOH:  (Social Determinants of Health) assessments and interventions performed: Yes SDOH Interventions    Flowsheet Row Most Recent Value  SDOH Interventions   Financial Strain  Interventions Intervention Not Indicated      SDOH Screenings   Alcohol Screen: Not on file  Depression (PHQ2-9): Medium Risk (10/06/2021)   Depression (PHQ2-9)    PHQ-2 Score: 7  Financial Resource Strain: Low Risk  (11/03/2021)   Overall Financial Resource Strain (CARDIA)    Difficulty of Paying Living Expenses: Not hard  at all  Food Insecurity: No Food Insecurity (08/05/2020)   Hunger Vital Sign    Worried About Running Out of Food in the Last Year: Never true    Ran Out of Food in the Last Year: Never true  Housing: Low Risk  (08/05/2020)   Housing    Last Housing Risk Score: 0  Physical Activity: Not on file  Social Connections: Not on file  Stress: Not on file  Tobacco Use: High Risk (11/03/2021)   Patient History    Smoking Tobacco Use: Every Day    Smokeless Tobacco Use: Never    Passive Exposure: Not on file  Transportation Needs: No Transportation Needs (06/22/2021)   PRAPARE - Transportation    Lack of Transportation (Medical): No    Lack of Transportation (Non-Medical): No    CCM Care Plan  Allergies  Allergen Reactions   Bupropion Other (See Comments)   Clarithromycin Other (See Comments)   Clopidogrel Other (See Comments)   Varenicline Other (See Comments)   Vitamin D Analogs Rash    High dosage of Vitamin D will cause rash High dosage of Vitamin D will cause rash High dosage of Vitamin D will cause rash    Prednisone Other (See Comments)    Hyperglycemia    Medications Reviewed Today     Reviewed by Lane Hacker, [facility] (Pharmacist) on 11/03/21 at 1129  Med List Status: <None>   Medication Order Taking? Sig Documenting Provider Last Dose Status Informant  alendronate (FOSAMAX) 70 MG tablet [id number] No Take 1 tablet (70 mg total) by mouth every 7 (seven) days. Take with a full glass of water on an empty stomach. Lillard Anes, MD Taking Active   ascorbic acid (VITAMIN C) 500 MG tablet [id number] No Take by mouth. [provider]  Taking Active   aspirin 81 MG EC tablet [id number] No  [provider] Taking Active   glycopyrrolate (ROBINUL) 1 MG tablet [id number]  TAKE TWO TABLETS BY MOUTH TWICE DAILY Lillard Anes, MD  Active   metoprolol tartrate (LOPRESSOR) 25 MG tablet [id number] No TAKE 1 TABLET BY MOUTH TWICE A DAY Lillard Anes, MD Taking Active   naloxone [facility]) 4 MG/0.1ML LIQD nasal spray kit [id number] No USE 1 SPRAY IN ONE NOSTRIL. MAY REPEAT IN OTHER NOSTRIL AFTER 2-3 MINS IF NEEDED [provider] Taking Active   nitroGLYCERIN (NITROLINGUAL) 0.4 MG/SPRAY spray [id number] No Place under the tongue. [provider] Taking Active   oxyCODONE-acetaminophen (PERCOCET) 7.5-325 MG tablet [id number] No Take 1 tablet by mouth 4 (four) times daily as needed. [provider] Taking Active   rosuvastatin (CRESTOR) 5 MG tablet [id number] No TAKE 1 TABLET BY MOUTH DAILY Lillard Anes, MD Taking Active   STIOLTO RESPIMAT 2.5-2.5 MCG/ACT AERS [id number] No INHALE 1 PUFF INTO THE LUNGS IN THE MORNING AND AT BEDTIME Lillard Anes, MD Taking Active   triamcinolone cream (KENALOG) 0.1 % [id number]  APPLY A THIN LAYER TO THE AFFECTED AREAS TWICE A DAY. Cox, Kirsten, MD  Active   valsartan (DIOVAN) 80 MG tablet [id number] No Take by mouth. [provider] Taking Active   ZTLIDO 1.8 % [facility] [id number] No  [provider] Taking Active             Patient Active Problem List   Diagnosis Date Noted   Adult BMI <19 kg/sq m 04/01/2021   Osteoporosis 08/12/2020   Malnutrition of moderate degree ([place]) 08/16/2019   COPD (chronic obstructive pulmonary disease) (  [place])    Mixed hyperlipidemia    Major depressive disorder, single episode, moderate (HCC)    Tobacco use disorder 10/03/2018   Bilateral carotid artery stenosis 01/04/2018   Hx of CABG 12/02/2016   Essential hypertension 11/17/2015   CAD in native artery 11/17/2015    Immunization History   Administered Date(s) Administered   Influenza-Unspecified 02/02/2019, 03/12/2020, 01/08/2021   Pneumococcal Conjugate-13 01/27/2015   Pneumococcal Polysaccharide-23 01/27/2016    Conditions to be addressed/monitored:  Hypertension, Hyperlipidemia, Coronary Artery Disease, COPD and Depression  Care Plan : [place]  Updates made by Lane Hacker, RPH since 11/03/2021 12:00 AM     Problem: cad, htn, hld, copd   Priority: High  Onset Date: 08/05/2020     Long-Range Goal: Disease Management   Start Date: 08/05/2020  Expected End Date: 08/05/2021  Recent Progress: On track  Priority: High  Note:   Current Barriers:  Patient is interested in support for smoking cessation but has tried and failed various products in the past   Pharmacist Clinical Goal(s):  Patient will achieve control of smoking cessation as evidenced by continued use and recommended treatment through collaboration with PharmD and provider.   Interventions: 1:1 collaboration with Lillard Anes, MD regarding development and update of comprehensive plan of care as evidenced by provider attestation and co-signature Inter-disciplinary care team collaboration (see longitudinal plan of care) Comprehensive medication review performed; medication list updated in electronic medical record  Hypertension (BP goal <130/80) BP Readings from Last 3 Encounters:  10/06/21 (!) 160/80  04/01/21 124/64  12/30/20 124/86  -Not ideally controlled -Current treatment: metoprolol tartrate 25 mg bid Appropriate, Effective, Safe, Accessible Valsartan Appropriate, Effective, Query Safe,  -Medications previously tried: Quinapril (Cost) -Current home readings:  March 2023: Didn't have list -Current dietary habits: Lives alone and doesn't cook for herself a lot. Fish and shrimp yesterday. Filet mignon in [place] on Sunday. Toast streudel, cereal or bacon and eggs for breakfast. Likes fruits and vegetables but hard  to use before they go bad.  -Current exercise habits: Uses above ground pool at her home during warmer weather. Back pain limits exercise.  -Denies hypotensive/hypertensive symptoms -Educated on BP goals and benefits of medications for prevention of heart attack, stroke and kidney damage; Daily salt intake goal < 2300 mg; Exercise goal of 150 minutes per week; Importance of home blood pressure monitoring; -Counseled to monitor BP at home occasionally, document, and provide log at future appointments -Counseled on diet and exercise extensively July 2023: Patient having ADR's on Valsartan (Dizzy and confused). Will ask PCP to change to Lisinopril (Quinapril worked but cost too much)  Hyperlipidemia/CAD: (LDL goal < 55) The 10-year ASCVD risk score (Arnett DK, et al., 2019) is: 13.2%   Values used to calculate the score:     Age: 38 years     Sex: Female     Is Non-Hispanic African American: No     Diabetic: No     Tobacco smoker: Yes     Systolic Blood Pressure: 343 mmHg     Is BP treated: Yes     HDL Cholesterol: 62 mg/dL     Total Cholesterol: 159 mg/dL Lab Results  Component Value Date   CHOL 159 10/06/2021   CHOL 133 04/08/2021   CHOL 139 12/15/2020   Lab Results  Component Value Date   HDL 62 10/06/2021   HDL 57 04/08/2021   HDL 52 12/15/2020   Lab Results  Component Value Date  LDLCALC 76 10/06/2021   LDLCALC 57 04/08/2021   LDLCALC 56 12/15/2020   Lab Results  Component Value Date   TRIG 120 10/06/2021   TRIG 104 04/08/2021   TRIG 193 (H) 12/15/2020   Lab Results  Component Value Date   CHOLHDL 2.6 10/06/2021   CHOLHDL 2.3 04/08/2021   CHOLHDL 2.7 12/15/2020  No results found for: "LDLDIRECT" -Controlled -Current treatment: aspirin 81 mg ec Appropriate, Effective, Safe, Accessible crestor 5 mg daily Appropriate, Effective, Safe, Accessible -Medications previously tried: none reported  -Current dietary patterns: Drinks 2% milk daily. Rarely eats cheese.   -Current exercise habits: limited due to back pain.  -Educated on Cholesterol goals;  Benefits of statin for ASCVD risk reduction; Importance of limiting foods high in cholesterol; Exercise goal of 150 minutes per week; -Counseled on diet and exercise extensively Recommended to continue current medication  COPD (Goal: control symptoms and prevent exacerbations) -Controlled -Current treatment  Stiolto 1 puff into the lungs in am and hs Appropriate, Effective, Safe, Accessible Albuterol PRN Appropriate, Effective, Safe, Accessible -Medications previously tried: none reported      No data to display        -Gold Grade: Gold 2 (FEV1 50-79%) -Pulmonary function testing: 08/16/2019 - 61% -Exacerbations requiring treatment in last 6 months: none reported -Patient reports consistent use of maintenance inhaler -Frequency of rescue inhaler use: none per patient  -Counseled on Benefits of consistent maintenance inhaler use July 2023: Patient not interested in quitting  Osteoporosis / Osteopenia (Goal prevent fractures) -Not ideally controlled -Last DEXA Scan: 07/2020   T-Score femoral neck:  07/2020: -3.2  T-Score lumbar spine:  07/2020: -3.5 -Patient is a candidate for pharmacologic treatment due to T-Score < -2.5 in lumbar spine -Current treatment  Alendronate 70 mg weekly (08/18/20 Start) Appropriate, Effective, Safe, Accessible Calcium with D Appropriate, Effective, Safe, Accessible -Medications previously tried: n/a  -Recommend [phone] units of vitamin D daily. Recommend 1200 mg of calcium daily from dietary and supplemental sources. Recommend weight-bearing and muscle strengthening exercises for building and maintaining bone density. -Counseled on diet and exercise extensively Educated on calcium and vitamin d recommendations. Patient does drink boost but has very little intake of food due to appetite.   Tobacco use (Goal smoking cessation ) -Uncontrolled -   July 2023:  1.5PPD March 2023: 1.5PPD September 2022: 2PPD October 2022: 1 PPD -Previous quit attempts: Chantix - allergic reaction, wellbutrin (Mental confusion), gum - dentures may prohibit chewing and parking -Current treatment  None reported -Patient smokes Within 30 minutes of waking -Patient triggers include: watching television, driving and finishing a meal -On a scale of 1-10, reports MOTIVATION to quit is 6 -On a scale of 1-10, reports CONFIDENCE in quitting is 6 -Provided contact information for [place] [facility]uit [facility] (1-800-QUIT-NOW) and encouraged patient to reach out to this group for support. August 2022: Counseled on benefits of smoking cessation. Discussed options of smoking cessation and patient is interested in inhaler. Insurance will not approve inhaler until nasal spray has been tried. Will discuss with patient and then Dr. Henrene Pastor if patient is ready to proceed.  October 2022: Nicotine nasal spray is causing ADR's (Congestion and sore throat), will ask PCP for Inhaled Nicotine. Patient smoking 1ppd (Down from 2PPD). Goal set for 1/2PPD by Nov 16th November 2022: Patient hasn't gotten inhaler and is still smoking 1PPD. New goal is 3/4 PPD by December and I will re-ask PCP for inhaler March 2023: Patient will call 1800 Quit-Now after she hangs up. She  is interested in quitting, she is up to 1.5PPD and wants to cut back. Will have concierge call monthly to get PPD May 2023: Patient got lozenges and patches in the mail. She planned on quitting today but her daughter just got forcibly admitted to a psych hospital after being in jail for drug use. She does not feel like she's able to quit or even think about quitting at this time. Will f/u in 2 months (Seeing PCP next month) July 2023: Patient is in the Contemplation phase but is not ready to quit yet. Counseled on benefits but she said, "I've got too much stress right now." I told her, "There's never going to be a good time to quit, life is full of moments  of stress" to which she agreed but stated she wasn't ready to quit     Patient Goals/Self-Care Activities Patient will:  - take medications as prescribed focus on medication adherence by considering a pill box.  check blood pressure weekly, document, and provide at future appointments engage in dietary modifications by limiting salt and focusing on heart-healthy diet.   Follow Up Plan: Telephone follow up appointment with care management team member scheduled for: PRN  Ari[place] Constable, Pharm.D. - [phone]        Medication Assistance: None required.  Patient affirms current coverage meets needs.  Patient's preferred pharmacy is:  [facility], [address] [place] [address] Phone: [phone] Fax: [phone]   Uses pill box? No - keeps stored on shelf in medicine cabinet Pt endorses good compliance  We discussed: Benefits of medication synchronization, packaging and delivery as well as enhanced pharmacist oversight with [employer]. Patient decided to: Continue current medication management strategy  Care Plan and Follow Up Patient Decision:  Patient agrees to Care Plan and Follow-up.  Plan: CPP F/U PRN  [place] Constable, Pharm.D. - [phone]

## 2021-11-05 ENCOUNTER — Other Ambulatory Visit: Payer: Self-pay | Admitting: Family Medicine

## 2021-11-05 ENCOUNTER — Ambulatory Visit: Payer: Medicare HMO

## 2021-11-05 ENCOUNTER — Other Ambulatory Visit: Payer: Self-pay

## 2021-11-05 MED ORDER — LISINOPRIL 20 MG PO TABS: 20.0000 mg | ORAL_TABLET | Freq: Every day | ORAL | 0 refills | Status: DC

## 2021-11-05 NOTE — Progress Notes (Signed)
Dorothy Parker comes in for recheck of her bp.  She was unable to tolerate the Valsartan and has been taking quinapril that she had on hand .

## 2021-11-10 DIAGNOSIS — Z79891 Long term (current) use of opiate analgesic: Secondary | ICD-10-CM | POA: Diagnosis not present

## 2021-11-10 DIAGNOSIS — M5136 Other intervertebral disc degeneration, lumbar region: Secondary | ICD-10-CM | POA: Diagnosis not present

## 2021-11-10 DIAGNOSIS — Z1389 Encounter for screening for other disorder: Secondary | ICD-10-CM | POA: Diagnosis not present

## 2021-11-10 DIAGNOSIS — M461 Sacroiliitis, not elsewhere classified: Secondary | ICD-10-CM | POA: Diagnosis not present

## 2021-11-10 DIAGNOSIS — G894 Chronic pain syndrome: Secondary | ICD-10-CM | POA: Diagnosis not present

## 2021-11-10 DIAGNOSIS — M47816 Spondylosis without myelopathy or radiculopathy, lumbar region: Secondary | ICD-10-CM | POA: Diagnosis not present

## 2021-11-10 DIAGNOSIS — M48061 Spinal stenosis, lumbar region without neurogenic claudication: Secondary | ICD-10-CM | POA: Diagnosis not present

## 2021-11-16 DIAGNOSIS — I10 Essential (primary) hypertension: Secondary | ICD-10-CM | POA: Diagnosis not present

## 2021-11-16 DIAGNOSIS — J449 Chronic obstructive pulmonary disease, unspecified: Secondary | ICD-10-CM

## 2021-11-16 DIAGNOSIS — E782 Mixed hyperlipidemia: Secondary | ICD-10-CM

## 2021-11-23 ENCOUNTER — Other Ambulatory Visit: Payer: Self-pay | Admitting: Legal Medicine

## 2021-11-23 ENCOUNTER — Other Ambulatory Visit: Payer: Self-pay | Admitting: Family Medicine

## 2021-11-23 DIAGNOSIS — J449 Chronic obstructive pulmonary disease, unspecified: Secondary | ICD-10-CM

## 2021-12-01 ENCOUNTER — Other Ambulatory Visit: Payer: Self-pay | Admitting: Family Medicine

## 2021-12-01 DIAGNOSIS — I10 Essential (primary) hypertension: Secondary | ICD-10-CM

## 2021-12-02 DIAGNOSIS — M48061 Spinal stenosis, lumbar region without neurogenic claudication: Secondary | ICD-10-CM | POA: Diagnosis not present

## 2021-12-02 DIAGNOSIS — G894 Chronic pain syndrome: Secondary | ICD-10-CM | POA: Diagnosis not present

## 2021-12-02 DIAGNOSIS — M546 Pain in thoracic spine: Secondary | ICD-10-CM | POA: Diagnosis not present

## 2021-12-02 DIAGNOSIS — M47816 Spondylosis without myelopathy or radiculopathy, lumbar region: Secondary | ICD-10-CM | POA: Diagnosis not present

## 2021-12-02 DIAGNOSIS — M461 Sacroiliitis, not elsewhere classified: Secondary | ICD-10-CM | POA: Diagnosis not present

## 2021-12-03 ENCOUNTER — Ambulatory Visit: Payer: Medicare HMO

## 2021-12-03 ENCOUNTER — Other Ambulatory Visit: Payer: Self-pay

## 2021-12-03 DIAGNOSIS — I10 Essential (primary) hypertension: Secondary | ICD-10-CM

## 2021-12-03 MED ORDER — LISINOPRIL 20 MG PO TABS: 20.0000 mg | ORAL_TABLET | Freq: Every day | ORAL | 0 refills | Status: DC

## 2021-12-08 DIAGNOSIS — Z79891 Long term (current) use of opiate analgesic: Secondary | ICD-10-CM | POA: Diagnosis not present

## 2021-12-08 DIAGNOSIS — M47816 Spondylosis without myelopathy or radiculopathy, lumbar region: Secondary | ICD-10-CM | POA: Diagnosis not present

## 2021-12-08 DIAGNOSIS — M48061 Spinal stenosis, lumbar region without neurogenic claudication: Secondary | ICD-10-CM | POA: Diagnosis not present

## 2021-12-08 DIAGNOSIS — M461 Sacroiliitis, not elsewhere classified: Secondary | ICD-10-CM | POA: Diagnosis not present

## 2021-12-08 DIAGNOSIS — Z1389 Encounter for screening for other disorder: Secondary | ICD-10-CM | POA: Diagnosis not present

## 2021-12-08 DIAGNOSIS — M5136 Other intervertebral disc degeneration, lumbar region: Secondary | ICD-10-CM | POA: Diagnosis not present

## 2021-12-08 DIAGNOSIS — G894 Chronic pain syndrome: Secondary | ICD-10-CM | POA: Diagnosis not present

## 2021-12-23 ENCOUNTER — Other Ambulatory Visit: Payer: Self-pay | Admitting: Legal Medicine

## 2021-12-23 ENCOUNTER — Other Ambulatory Visit: Payer: Self-pay | Admitting: Family Medicine

## 2021-12-24 ENCOUNTER — Ambulatory Visit (INDEPENDENT_AMBULATORY_CARE_PROVIDER_SITE_OTHER): Payer: Medicare HMO

## 2021-12-24 NOTE — Patient Instructions (Signed)
Visit Information   Goals Addressed   None    Patient Care Plan: CCM Pharmacy Care Plan     Problem Identified: cad, htn, hld, copd   Priority: High  Onset Date: 08/05/2020     Long-Range Goal: Disease Management   Start Date: 08/05/2020  Expected End Date: 08/05/2021  Recent Progress: On track  Priority: High  Note:   Current Barriers:  Patient is interested in support for smoking cessation but has tried and failed various products in the past   Pharmacist Clinical Goal(s):  Patient will achieve control of smoking cessation as evidenced by continued use and recommended treatment through collaboration with PharmD and provider.   Interventions: 1:1 collaboration with Abigail Miyamoto, MD regarding development and update of comprehensive plan of care as evidenced by provider attestation and co-signature Inter-disciplinary care team collaboration (see longitudinal plan of care) Comprehensive medication review performed; medication list updated in electronic medical record  Hypertension (BP goal <130/80) BP Readings from Last 3 Encounters:  11/05/21 (!) 160/60  10/06/21 (!) 160/80  04/01/21 124/64  -Not ideally controlled -Current treatment: metoprolol tartrate 25 mg bid Appropriate, Effective, Safe, Accessible Lisinopril 20mg  Appropriate, Effective, Safe, Accessible -Medications previously tried: Quinapril (Cost), Valsartan -Current home readings:  March 2023: Didn't have list Sept 2023: (Uses her watch to test)  112/72  110/72 -Current dietary habits: Lives alone and doesn't cook for herself a lot. Fish and shrimp yesterday. Filet mignon in airfryer on Sunday. Toast streudel, cereal or bacon and eggs for breakfast. Likes fruits and vegetables but hard to use before they go bad.  -Current exercise habits: Uses above ground pool at her home during warmer weather. Back pain limits exercise.  -Denies hypotensive/hypertensive symptoms -Educated on BP goals and benefits  of medications for prevention of heart attack, stroke and kidney damage; Daily salt intake goal < 2300 mg; Exercise goal of 150 minutes per week; Importance of home blood pressure monitoring; -Counseled to monitor BP at home occasionally, document, and provide log at future appointments -Counseled on diet and exercise extensively July 2023: Patient having ADR's on Valsartan (Dizzy and confused). Will ask PCP to change to Lisinopril (Quinapril worked but cost too much)  Hyperlipidemia/CAD: (LDL goal < 55) The 10-year ASCVD risk score (Arnett DK, et al., 2019) is: 14.4%   Values used to calculate the score:     Age: 61 years     Sex: Female     Is Non-Hispanic African American: No     Diabetic: No     Tobacco smoker: Yes     Systolic Blood Pressure: 160 mmHg     Is BP treated: Yes     HDL Cholesterol: 62 mg/dL     Total Cholesterol: 159 mg/dL Lab Results  Component Value Date   CHOL 159 10/06/2021   CHOL 133 04/08/2021   CHOL 139 12/15/2020   Lab Results  Component Value Date   HDL 62 10/06/2021   HDL 57 04/08/2021   HDL 52 12/15/2020   Lab Results  Component Value Date   LDLCALC 76 10/06/2021   LDLCALC 57 04/08/2021   LDLCALC 56 12/15/2020   Lab Results  Component Value Date   TRIG 120 10/06/2021   TRIG 104 04/08/2021   TRIG 193 (H) 12/15/2020   Lab Results  Component Value Date   CHOLHDL 2.6 10/06/2021   CHOLHDL 2.3 04/08/2021   CHOLHDL 2.7 12/15/2020  No results found for: "LDLDIRECT" -Controlled -Current treatment: aspirin 81 mg ec Appropriate, Effective, Safe,  Accessible crestor 5 mg daily Appropriate, Effective, Safe, Accessible -Medications previously tried: none reported  -Current dietary patterns: Drinks 2% milk daily. Rarely eats cheese.  -Current exercise habits: limited due to back pain.  -Educated on Cholesterol goals;  Benefits of statin for ASCVD risk reduction; Importance of limiting foods high in cholesterol; Exercise goal of 150 minutes per  week; -Counseled on diet and exercise extensively Recommended to continue current medication  COPD (Goal: control symptoms and prevent exacerbations) -Controlled -Current treatment  Stiolto 1 puff into the lungs in am and hs Appropriate, Effective, Safe, Accessible Albuterol PRN Appropriate, Effective, Safe, Accessible -Medications previously tried: none reported      No data to display        -Gold Grade: Gold 2 (FEV1 50-79%) -Pulmonary function testing: 08/16/2019 - 61% -Exacerbations requiring treatment in last 6 months: none reported -Patient reports consistent use of maintenance inhaler -Frequency of rescue inhaler use: none per patient  -Counseled on Benefits of consistent maintenance inhaler use July 2023: Patient not interested in quitting  Osteoporosis / Osteopenia (Goal prevent fractures) -Not ideally controlled -Last DEXA Scan: 07/2020   T-Score femoral neck:  07/2020: -3.2  T-Score lumbar spine:  07/2020: -3.5 -Patient is a candidate for pharmacologic treatment due to T-Score < -2.5 in lumbar spine -Current treatment  Alendronate 70 mg weekly (08/18/20 Start) Appropriate, Effective, Safe, Accessible Calcium with D Appropriate, Effective, Safe, Accessible -Medications previously tried: n/a  -Recommend [phone] units of vitamin D daily. Recommend 1200 mg of calcium daily from dietary and supplemental sources. Recommend weight-bearing and muscle strengthening exercises for building and maintaining bone density. -Counseled on diet and exercise extensively Educated on calcium and vitamin d recommendations. Patient does drink boost but has very little intake of food due to appetite.   Tobacco use (Goal smoking cessation ) -Uncontrolled -   July 2023: 1.5PPD March 2023: 1.5PPD September 2022: 2PPD October 2022: 1 PPD -Previous quit attempts: Chantix - allergic reaction, wellbutrin (Mental confusion), gum - dentures may prohibit chewing and parking -Current treatment   None reported -Patient smokes Within 30 minutes of waking -Patient triggers include: watching television, driving and finishing a meal -On a scale of 1-10, reports MOTIVATION to quit is 6 -On a scale of 1-10, reports CONFIDENCE in quitting is 6 -Provided contact information for  Quit Line (1-800-QUIT-NOW) and encouraged patient to reach out to this group for support. August 2022: Counseled on benefits of smoking cessation. Discussed options of smoking cessation and patient is interested in inhaler. Insurance will not approve inhaler until nasal spray has been tried. Will discuss with patient and then Dr. Marina Goodell if patient is ready to proceed.  October 2022: Nicotine nasal spray is causing ADR's (Congestion and sore throat), will ask PCP for Inhaled Nicotine. Patient smoking 1ppd (Down from 2PPD). Goal set for 1/2PPD by Nov 16th November 2022: Patient hasn't gotten inhaler and is still smoking 1PPD. New goal is 3/4 PPD by December and I will re-ask PCP for inhaler March 2023: Patient will call [phone] Quit-Now after she hangs up. She is interested in quitting, she is up to 1.5PPD and wants to cut back. Will have concierge call monthly to get PPD May 2023: Patient got lozenges and patches in the mail. She planned on quitting today but her daughter just got forcibly admitted to a psych hospital after being in jail for drug use. She does not feel like she's able to quit or even think about quitting at this time. Will f/u in 2  months (Seeing PCP next month) July 2023: Patient is in the Contemplation phase but is not ready to quit yet. Counseled on benefits but she said, "I've got too much stress right now." I told her, "There's never going to be a good time to quit, life is full of moments of stress" to which she agreed but stated she wasn't ready to quit Sept 2023: Daughter's eye Sx was successful. She is still in prison and won't be let out until November. Patient's dog, Bullet, had to be put down 2 weeks  ago. Patient is not open to quitting at this time.    Patient Goals/Self-Care Activities Patient will:  - take medications as prescribed focus on medication adherence by considering a pill box.  check blood pressure weekly, document, and provide at future appointments engage in dietary modifications by limiting salt and focusing on heart-healthy diet.   Follow Up Plan: Telephone follow up appointment with care management team member scheduled for: PRN  Artelia Laroche, Pharm.D. - [phone]      Dorothy Parker was given information about Chronic Care Management services today including:  CCM service includes personalized support from designated clinical staff supervised by her physician, including individualized plan of care and coordination with other care providers 24/7 contact phone numbers for assistance for urgent and routine care needs. Standard insurance, coinsurance, copays and deductibles apply for chronic care management only during months in which we provide at least 20 minutes of these services. Most insurances cover these services at 100%, however patients may be responsible for any copay, coinsurance and/or deductible if applicable. This service may help you avoid the need for more expensive face-to-face services. Only one practitioner may furnish and bill the service in a calendar month. The patient may stop CCM services at any time (effective at the end of the month) by phone call to the office staff.  Patient agreed to services and verbal consent obtained.   The patient verbalized understanding of instructions, educational materials, and care plan provided today and DECLINED offer to receive copy of patient instructions, educational materials, and care plan.  CPP F/U PRN  Dorothy Parker, [facility] [phone]

## 2021-12-24 NOTE — Progress Notes (Signed)
Chronic Care Management Pharmacy Note  12/24/2021 Name:  Dorothy Parker MRN:  [id number] DOB:  06-Nov-1960  Subjective: Dorothy Parker is an 61 y.o. year old female who is a primary patient of Henrene Pastor, Zeb Comfort, MD.  The CCM team was consulted for assistance with disease management and care coordination needs.    Plan Updates:   Patient smoking 1.5PPD. Got lozenges/patches NRT in the mail but she would like to hold off on quitting until her daughter is in a better place (See CP for details)   Engaged with patient by telephone for follow up visit in response to provider referral for pharmacy case management and/or care coordination services.   Consent to Services:  The patient was given the following information about Chronic Care Management services today, agreed to services, and gave verbal consent: 1. CCM service includes personalized support from designated clinical staff supervised by the primary care provider, including individualized plan of care and coordination with other care providers 2. 24/7 contact phone numbers for assistance for urgent and routine care needs. 3. Service will only be billed when office clinical staff spend 20 minutes or more in a month to coordinate care. 4. Only one practitioner may furnish and bill the service in a calendar month. 5.The patient may stop CCM services at any time (effective at the end of the month) by phone call to the office staff. 6. The patient will be responsible for cost sharing (co-pay) of up to 20% of the service fee (after annual deductible is met). Patient agreed to services and consent obtained.  Patient Care Team: Lillard Anes, MD as PCP - General (Family Medicine) Flossie Buffy., MD as Referring Physician (Cardiology) Dorthula Rue, MD as Referring Physician (Vascular Surgery) Duanne Limerick, OD as Consulting Physician (Ophthalmology) Lane Hacker, [facility] (Pharmacist)  Recent office visits:   None   Recent consult visits:  None   Hospital visits:  None    Objective:  Lab Results  Component Value Date   CREATININE 0.61 10/06/2021   BUN 19 10/06/2021   GFRNONAA 97 12/17/2019   GFRAA 112 12/17/2019   NA 142 10/06/2021   K 4.6 10/06/2021   CALCIUM 9.4 10/06/2021   CO2 21 10/06/2021   GLUCOSE 94 10/06/2021    No results found for: "HGBA1C", "FRUCTOSAMINE", "GFR", "MICROALBUR"  Last diabetic Eye exam: No results found for: "HMDIABEYEEXA"  Last diabetic Foot exam: No results found for: "HMDIABFOOTEX"   Lab Results  Component Value Date   CHOL 159 10/06/2021   HDL 62 10/06/2021   LDLCALC 76 10/06/2021   TRIG 120 10/06/2021   CHOLHDL 2.6 10/06/2021       Latest Ref Rng & Units 10/06/2021    9:06 AM 04/08/2021    8:53 AM 12/15/2020    9:48 AM  Hepatic Function  Total Protein 6.0 - 8.5 g/dL 7.1  6.9  7.1   Albumin 3.8 - 4.8 g/dL 4.4  4.8  4.4   AST 0 - 40 IU/L _0 ALT 0 - 32 IU/L _1 Alk Phosphatase 44 - 121 IU/L 82  74  74   Total Bilirubin 0.0 - 1.2 mg/dL 0.2  0.5  0.4     Lab Results  Component Value Date/Time   TSH 0.378 (L) 04/08/2021 08:53 AM   TSH 0.688 12/17/2019 09:34 AM       Latest Ref Rng & Units 10/06/2021  9:06 AM 04/08/2021    8:53 AM 12/15/2020    9:48 AM  CBC  WBC 3.4 - 10.8 x10E3/uL 6.2  8.1  6.7   Hemoglobin 11.1 - 15.9 g/dL 13.7  14.6  14.4   Hematocrit 34.0 - 46.6 % 39.9  43.4  42.6   Platelets 150 - 450 x10E3/uL 196  220  207     No results found for: "VD25OH"  Clinical ASCVD: Yes  The 10-year ASCVD risk score (Arnett DK, et al., 2019) is: 14.4%   Values used to calculate the score:     Age: 1 years     Sex: Female     Is Non-Hispanic African American: No     Diabetic: No     Tobacco smoker: Yes     Systolic Blood Pressure: 071 mmHg     Is BP treated: Yes     HDL Cholesterol: 62 mg/dL     Total Cholesterol: 159 mg/dL       10/06/2021    8:39 AM 12/18/2020    2:49 PM 12/15/2020    9:06 AM   Depression screen PHQ 2/9  Decreased Interest 1 0 0  Down, Depressed, Hopeless 1 0 0  PHQ - 2 Score 2 0 0  Altered sleeping 1  3  Tired, decreased energy 2  3  Change in appetite 2  2  Feeling bad or failure about yourself  0  0  Trouble concentrating 0  0  Moving slowly or fidgety/restless 0  0  Suicidal thoughts 0  0  PHQ-9 Score 7  8  Difficult doing work/chores Somewhat difficult  Not difficult at all     Social History   Tobacco Use  Smoking Status Every Day   Packs/day: 1.50   Years: 45.00   Total pack years: 67.50   Types: Cigarettes  Smokeless Tobacco Never   BP Readings from Last 3 Encounters:  11/05/21 (!) 160/60  10/06/21 (!) 160/80  04/01/21 124/64   Pulse Readings from Last 3 Encounters:  11/05/21 64  10/06/21 (!) 45  04/01/21 78   Wt Readings from Last 3 Encounters:  10/06/21 102 lb (46.3 kg)  04/01/21 101 lb (45.8 kg)  12/30/20 98 lb (44.5 kg)   BMI Readings from Last 3 Encounters:  10/06/21 17.51 kg/m  04/01/21 17.34 kg/m  12/30/20 17.36 kg/m    Assessment/Interventions: Review of patient past medical history, allergies, medications, health status, including review of consultants reports, laboratory and other test data, was performed as part of comprehensive evaluation and provision of chronic care management services.   SDOH:  (Social Determinants of Health) assessments and interventions performed: Yes SDOH Interventions    Flowsheet Row Chronic Care Management from 12/24/2021 in [place] Management from 11/03/2021 in [place] Office Visit from 10/06/2021 in [place] Management from 08/20/2021 in [place] Management from 06/22/2021 in [place] Office Visit from 06/16/2020 in [place]  SDOH Interventions        Transportation Interventions Intervention Not Indicated -- -- -- Intervention Not Indicated --  Depression Interventions/Treatment  -- --  Counseling -- -- Counseling  Financial Strain Interventions Intervention Not Indicated Intervention Not Indicated -- Intervention Not Indicated Intervention Not Indicated --      SDOH Screenings   Food Insecurity: No Food Insecurity (08/05/2020)  Housing: [place]  (08/05/2020)  Transportation Needs: No Transportation Needs (12/24/2021)  Depression (PHQ2-9): Medium Risk (10/06/2021)  Financial Resource Strain: Low Risk  (12/24/2021)  Tobacco Use: High Risk (11/03/2021)    CCM Care Plan  Allergies  Allergen Reactions   Bupropion Other (See Comments)   Clarithromycin Other (See Comments)   Clopidogrel Other (See Comments)   Varenicline Other (See Comments)   Vitamin D Analogs Rash    High dosage of Vitamin D will cause rash High dosage of Vitamin D will cause rash High dosage of Vitamin D will cause rash    Prednisone Other (See Comments)    Hyperglycemia   Valsartan     dizziness    Medications Reviewed Today     Reviewed by Lane Hacker, [facility] (Pharmacist) on 12/24/21 at [place] List Status: <None>   Medication Order Taking? Sig Documenting Provider Last Dose Status Informant  alendronate (FOSAMAX) 70 MG tablet [id number]  TAKE 1 TABLET BY MOUTH EVERY 7 DAYS. TAKE WITH A FULL GLASS OF WATER ON AN EMPTY STOMACH. Lillard Anes, MD  Active   ascorbic acid (VITAMIN C) 500 MG tablet [id number] No Take by mouth. [provider] Taking Active   aspirin 81 MG EC tablet [id number] No  [provider] Taking Active   glycopyrrolate (ROBINUL) 1 MG tablet [id number]  TAKE TWO TABLETS BY MOUTH TWICE DAILY Lillard Anes, MD  Active   lisinopril (ZESTRIL) 20 MG tablet [id number]  Take 1 tablet (20 mg total) by mouth daily. Lillard Anes, MD  Active   metoprolol tartrate (LOPRESSOR) 25 MG tablet [id number] No TAKE 1 TABLET BY MOUTH TWICE A DAY Lillard Anes, MD Taking Active   naloxone [facility]) 4 MG/0.1ML LIQD nasal spray kit [id number] No USE  1 SPRAY IN ONE NOSTRIL. MAY REPEAT IN OTHER NOSTRIL AFTER 2-3 MINS IF NEEDED [provider] Taking Active   nitroGLYCERIN (NITROLINGUAL) 0.4 MG/SPRAY spray [id number] No Place under the tongue. [provider] Taking Active   oxyCODONE-acetaminophen (PERCOCET) 7.5-325 MG tablet [id number] No Take 1 tablet by mouth 4 (four) times daily as needed. [provider] Taking Active   rosuvastatin (CRESTOR) 5 MG tablet [id number] No TAKE 1 TABLET BY MOUTH DAILY Lillard Anes, MD Taking Active   STIOLTO RESPIMAT 2.5-2.5 MCG/ACT AERS [id number]  INHALE 1 PUFF INTO THE LUNGS IN THE MORNING AND AT BEDTIME Lillard Anes, MD  Active   triamcinolone cream (KENALOG) 0.1 % [id number]  APPLY A THIN LAYER TO THE AFFECTED AREAS TWICE A DAY. Lillard Anes, MD  Active   ZTLIDO 1.8 % [facility] [id number] No  [provider] Taking Active             Patient Active Problem List   Diagnosis Date Noted   Adult BMI <19 kg/sq m 04/01/2021   Osteoporosis 08/12/2020   Malnutrition of moderate degree ([facility]) 08/16/2019   COPD (chronic obstructive pulmonary disease) (HCC)    Mixed hyperlipidemia    Major depressive disorder, single episode, moderate (HCC)    Tobacco use disorder 10/03/2018   Bilateral carotid artery stenosis 01/04/2018   Hx of CABG 12/02/2016   Essential hypertension 11/17/2015   CAD in native artery 11/17/2015    Immunization History  Administered Date(s) Administered   Influenza-Unspecified 02/02/2019, 03/12/2020, 01/08/2021   Pneumococcal Conjugate-13 01/27/2015   Pneumococcal Polysaccharide-23 01/27/2016    Conditions to be addressed/monitored:  Hypertension, Hyperlipidemia, Coronary Artery Disease, COPD and Depression  Care Plan : [place]  Updates made by Lane Hacker, [place] since 12/24/2021 12:00 AM  Problem: cad, htn, hld, copd   Priority: High  Onset Date: 08/05/2020     Long-Range Goal: Disease Management    Start Date: 08/05/2020  Expected End Date: 08/05/2021  Recent Progress: On track  Priority: High  Note:   Current Barriers:  Patient is interested in support for smoking cessation but has tried and failed various products in the past   Pharmacist Clinical Goal(s):  Patient will achieve control of smoking cessation as evidenced by continued use and recommended treatment through collaboration with PharmD and provider.   Interventions: 1:1 collaboration with Lillard Anes, MD regarding development and update of comprehensive plan of care as evidenced by provider attestation and co-signature Inter-disciplinary care team collaboration (see longitudinal plan of care) Comprehensive medication review performed; medication list updated in electronic medical record  Hypertension (BP goal <130/80) BP Readings from Last 3 Encounters:  11/05/21 (!) 160/60  10/06/21 (!) 160/80  04/01/21 124/64  -Not ideally controlled -Current treatment: metoprolol tartrate 25 mg bid Appropriate, Effective, Safe, Accessible Lisinopril 46m Appropriate, Effective, Safe, Accessible -Medications previously tried: Quinapril (Cost), Valsartan -Current home readings:  March 2023: Didn't have list Sept 2023: (Uses her watch to test)  112/72  110/72 -Current dietary habits: Lives alone and doesn't cook for herself a lot. Fish and shrimp yesterday[place] Toast streudel, cereal or bacon and eggs for breakfast. Likes fruits and vegetables but hard to use before they go bad.  -Current exercise habits: Uses above ground pool at her home during warmer weather. Back pain limits exercise.  -Denies hypotensive/hypertensive symptoms -Educated on BP goals and benefits of medications for prevention of heart attack, stroke and kidney damage; Daily salt intake goal < 2300 mg; Exercise goal of 150 minutes per week; Importance of home blood pressure monitoring; -Counseled to monitor BP at home  occasionally, document, and provide log at future appointments -Counseled on diet and exercise extensively July 2023: Patient having ADR's on Valsartan (Dizzy and confused). Will ask PCP to change to Lisinopril (Quinapril worked but cost too much)  Hyperlipidemia/CAD: (LDL goal < 55) The 10-year ASCVD risk score (Arnett DK, et al., 2019) is: 14.4%   Values used to calculate the score:     Age: 89years     Sex: Female     Is Non-Hispanic African American: No     Diabetic: No     Tobacco smoker: Yes     Systolic Blood Pressure: 1681mmHg     Is BP treated: Yes     HDL Cholesterol: 62 mg/dL     Total Cholesterol: 159 mg/dL Lab Results  Component Value Date   CHOL 159 10/06/2021   CHOL 133 04/08/2021   CHOL 139 12/15/2020   Lab Results  Component Value Date   HDL 62 10/06/2021   HDL 57 04/08/2021   HDL 52 12/15/2020   Lab Results  Component Value Date   LDLCALC 76 10/06/2021   LDLCALC 57 04/08/2021   LDLCALC 56 12/15/2020   Lab Results  Component Value Date   TRIG 120 10/06/2021   TRIG 104 04/08/2021   TRIG 193 (H) 12/15/2020   Lab Results  Component Value Date   CHOLHDL 2.6 10/06/2021   CHOLHDL 2.3 04/08/2021   CHOLHDL 2.7 12/15/2020  No results found for: "LDLDIRECT" -Controlled -Current treatment: aspirin 81 mg ec Appropriate, Effective, Safe, Accessible crestor 5 mg daily Appropriate, Effective, Safe, Accessible -Medications previously tried: none reported  -Current dietary patterns: Drinks 2% milk daily. Rarely  eats cheese.  -Current exercise habits: limited due to back pain.  -Educated on Cholesterol goals;  Benefits of statin for ASCVD risk reduction; Importance of limiting foods high in cholesterol; Exercise goal of 150 minutes per week; -Counseled on diet and exercise extensively Recommended to continue current medication  COPD (Goal: control symptoms and prevent exacerbations) -Controlled -Current treatment  Stiolto 1 puff into the lungs in am and  hs Appropriate, Effective, Safe, Accessible Albuterol PRN Appropriate, Effective, Safe, Accessible -Medications previously tried: none reported      No data to display        -Gold Grade: Gold 2 (FEV1 50-79%) -Pulmonary function testing: 08/16/2019 - 61% -Exacerbations requiring treatment in last 6 months: none reported -Patient reports consistent use of maintenance inhaler -Frequency of rescue inhaler use: none per patient  -Counseled on Benefits of consistent maintenance inhaler use July 2023: Patient not interested in quitting  Osteoporosis / Osteopenia (Goal prevent fractures) -Not ideally controlled -Last DEXA Scan: 07/2020   T-Score femoral neck:  07/2020: -3.2  T-Score lumbar spine:  07/2020: -3.5 -Patient is a candidate for pharmacologic treatment due to T-Score < -2.5 in lumbar spine -Current treatment  Alendronate 70 mg weekly (08/18/20 Start) Appropriate, Effective, Safe, Accessible Calcium with D Appropriate, Effective, Safe, Accessible -Medications previously tried: n/a  -Recommend [id number] units of vitamin D daily. Recommend 1200 mg of calcium daily from dietary and supplemental sources. Recommend weight-bearing and muscle strengthening exercises for building and maintaining bone density. -Counseled on diet and exercise extensively Educated on calcium and vitamin d recommendations. Patient does drink boost but has very little intake of food due to appetite.   Tobacco use (Goal smoking cessation ) -Uncontrolled -   July 2023: 1.5PPD March 2023: 1.5PPD September 2022: 2PPD October 2022: 1 PPD -Previous quit attempts: Chantix - allergic reaction, wellbutrin (Mental confusion), gum - dentures may prohibit chewing and parking -Current treatment  None reported -Patient smokes Within 30 minutes of waking -Patient triggers include: watching television, driving and finishing a meal -On a scale of 1-10, reports MOTIVATION to quit is 6 -On a scale of 1-10, reports  CONFIDENCE in quitting is 6 -Provided contact information for [place] [facility]uit Line (1-800-QUIT-NOW) and encouraged patient to reach out to this group for support. August 2022: Counseled on benefits of smoking cessation. Discussed options of smoking cessation and patient is interested in inhaler. Insurance will not approve inhaler until nasal spray has been tried. Will discuss with patient and then Dr. Henrene Pastor if patient is ready to proceed.  October 2022: Nicotine nasal spray is causing ADR's (Congestion and sore throat), will ask PCP for Inhaled Nicotine. Patient smoking 1ppd (Down from 2PPD). Goal set for 1/2PPD by Nov 16th November 2022: Patient hasn't gotten inhaler and is still smoking 1PPD. New goal is 3/4 PPD by December and I will re-ask PCP for inhaler March 2023: Patient will call [phone] Quit-Now after she hangs up. She is interested in quitting, she is up to 1.5PPD and wants to cut back. Will have concierge call monthly to get PPD May 2023: Patient got lozenges and patches in the mail. She planned on quitting today but her daughter just got forcibly admitted to a psych hospital after being in jail for drug use. She does not feel like she's able to quit or even think about quitting at this time. Will f/u in 2 months (Seeing PCP next month) July 2023: Patient is in the Contemplation phase but is not ready to quit yet. Counseled on benefits  but she said, "I've got too much stress right now." I told her, "There's never going to be a good time to quit, life is full of moments of stress" to which she agreed but stated she wasn't ready to quit Sept 2023: Daughter's eye Sx was successful. She is still in prison and won't be let out until November. Patient's dog, Bullet, had to be put down 2 weeks ago. Patient is not open to quitting at this time.    Patient Goals/Self-Care Activities Patient will:  - take medications as prescribed focus on medication adherence by considering a pill box.  check blood pressure  weekly, document, and provide at future appointments engage in dietary modifications by limiting salt and focusing on heart-healthy diet.   Follow Up Plan: Telephone follow up appointment with care management team member scheduled for: PRN  [place] Constable, Pharm.D. - [phone]        Medication Assistance: None required.  Patient affirms current coverage meets needs.  Patient's preferred pharmacy is:  [facility], [address] [place] [address] Phone: [phone] Fax: [phone]   Uses pill box? No - keeps stored on shelf in medicine cabinet Pt endorses good compliance  We discussed: Benefits of medication synchronization, packaging and delivery as well as enhanced pharmacist oversight with [employer]. Patient decided to: Continue current medication management strategy  Care Plan and Follow Up Patient Decision:  Patient agrees to Care Plan and Follow-up.  Plan: CPP F/U PRN  [place] Constable, Pharm.D. - [phone]

## 2022-01-02 DIAGNOSIS — M546 Pain in thoracic spine: Secondary | ICD-10-CM | POA: Diagnosis not present

## 2022-01-02 DIAGNOSIS — M48061 Spinal stenosis, lumbar region without neurogenic claudication: Secondary | ICD-10-CM | POA: Diagnosis not present

## 2022-01-02 DIAGNOSIS — M461 Sacroiliitis, not elsewhere classified: Secondary | ICD-10-CM | POA: Diagnosis not present

## 2022-01-02 DIAGNOSIS — G894 Chronic pain syndrome: Secondary | ICD-10-CM | POA: Diagnosis not present

## 2022-01-02 DIAGNOSIS — M47816 Spondylosis without myelopathy or radiculopathy, lumbar region: Secondary | ICD-10-CM | POA: Diagnosis not present

## 2022-01-05 DIAGNOSIS — M48061 Spinal stenosis, lumbar region without neurogenic claudication: Secondary | ICD-10-CM | POA: Diagnosis not present

## 2022-01-05 DIAGNOSIS — M5136 Other intervertebral disc degeneration, lumbar region: Secondary | ICD-10-CM | POA: Diagnosis not present

## 2022-01-05 DIAGNOSIS — Z1389 Encounter for screening for other disorder: Secondary | ICD-10-CM | POA: Diagnosis not present

## 2022-01-05 DIAGNOSIS — M47816 Spondylosis without myelopathy or radiculopathy, lumbar region: Secondary | ICD-10-CM | POA: Diagnosis not present

## 2022-01-05 DIAGNOSIS — G894 Chronic pain syndrome: Secondary | ICD-10-CM | POA: Diagnosis not present

## 2022-01-05 DIAGNOSIS — Z79891 Long term (current) use of opiate analgesic: Secondary | ICD-10-CM | POA: Diagnosis not present

## 2022-01-05 DIAGNOSIS — M461 Sacroiliitis, not elsewhere classified: Secondary | ICD-10-CM | POA: Diagnosis not present

## 2022-01-16 DIAGNOSIS — I119 Hypertensive heart disease without heart failure: Secondary | ICD-10-CM | POA: Diagnosis not present

## 2022-01-16 DIAGNOSIS — J449 Chronic obstructive pulmonary disease, unspecified: Secondary | ICD-10-CM

## 2022-01-16 DIAGNOSIS — E782 Mixed hyperlipidemia: Secondary | ICD-10-CM | POA: Diagnosis not present

## 2022-01-16 DIAGNOSIS — I2510 Atherosclerotic heart disease of native coronary artery without angina pectoris: Secondary | ICD-10-CM | POA: Diagnosis not present

## 2022-01-16 DIAGNOSIS — F17210 Nicotine dependence, cigarettes, uncomplicated: Secondary | ICD-10-CM | POA: Diagnosis not present

## 2022-01-16 DIAGNOSIS — M810 Age-related osteoporosis without current pathological fracture: Secondary | ICD-10-CM | POA: Diagnosis not present

## 2022-01-23 ENCOUNTER — Other Ambulatory Visit: Payer: Self-pay | Admitting: Legal Medicine

## 2022-01-23 DIAGNOSIS — J449 Chronic obstructive pulmonary disease, unspecified: Secondary | ICD-10-CM

## 2022-01-26 ENCOUNTER — Other Ambulatory Visit: Payer: Self-pay | Admitting: Legal Medicine

## 2022-01-26 DIAGNOSIS — I10 Essential (primary) hypertension: Secondary | ICD-10-CM

## 2022-02-01 DIAGNOSIS — G894 Chronic pain syndrome: Secondary | ICD-10-CM | POA: Diagnosis not present

## 2022-02-01 DIAGNOSIS — M461 Sacroiliitis, not elsewhere classified: Secondary | ICD-10-CM | POA: Diagnosis not present

## 2022-02-01 DIAGNOSIS — M546 Pain in thoracic spine: Secondary | ICD-10-CM | POA: Diagnosis not present

## 2022-02-01 DIAGNOSIS — M47816 Spondylosis without myelopathy or radiculopathy, lumbar region: Secondary | ICD-10-CM | POA: Diagnosis not present

## 2022-02-01 DIAGNOSIS — M48061 Spinal stenosis, lumbar region without neurogenic claudication: Secondary | ICD-10-CM | POA: Diagnosis not present

## 2022-02-08 DIAGNOSIS — Z1389 Encounter for screening for other disorder: Secondary | ICD-10-CM | POA: Diagnosis not present

## 2022-02-08 DIAGNOSIS — G894 Chronic pain syndrome: Secondary | ICD-10-CM | POA: Diagnosis not present

## 2022-02-08 DIAGNOSIS — M461 Sacroiliitis, not elsewhere classified: Secondary | ICD-10-CM | POA: Diagnosis not present

## 2022-02-08 DIAGNOSIS — Z79891 Long term (current) use of opiate analgesic: Secondary | ICD-10-CM | POA: Diagnosis not present

## 2022-02-08 DIAGNOSIS — M5136 Other intervertebral disc degeneration, lumbar region: Secondary | ICD-10-CM | POA: Diagnosis not present

## 2022-02-08 DIAGNOSIS — M48061 Spinal stenosis, lumbar region without neurogenic claudication: Secondary | ICD-10-CM | POA: Diagnosis not present

## 2022-02-08 DIAGNOSIS — M47816 Spondylosis without myelopathy or radiculopathy, lumbar region: Secondary | ICD-10-CM | POA: Diagnosis not present

## 2022-02-09 ENCOUNTER — Other Ambulatory Visit: Payer: Self-pay | Admitting: Legal Medicine

## 2022-02-09 DIAGNOSIS — J449 Chronic obstructive pulmonary disease, unspecified: Secondary | ICD-10-CM

## 2022-02-24 ENCOUNTER — Other Ambulatory Visit: Payer: Self-pay | Admitting: Legal Medicine

## 2022-03-04 DIAGNOSIS — M48061 Spinal stenosis, lumbar region without neurogenic claudication: Secondary | ICD-10-CM | POA: Diagnosis not present

## 2022-03-04 DIAGNOSIS — M47816 Spondylosis without myelopathy or radiculopathy, lumbar region: Secondary | ICD-10-CM | POA: Diagnosis not present

## 2022-03-04 DIAGNOSIS — G894 Chronic pain syndrome: Secondary | ICD-10-CM | POA: Diagnosis not present

## 2022-03-04 DIAGNOSIS — M546 Pain in thoracic spine: Secondary | ICD-10-CM | POA: Diagnosis not present

## 2022-03-04 DIAGNOSIS — M461 Sacroiliitis, not elsewhere classified: Secondary | ICD-10-CM | POA: Diagnosis not present

## 2022-03-08 DIAGNOSIS — M5136 Other intervertebral disc degeneration, lumbar region: Secondary | ICD-10-CM | POA: Diagnosis not present

## 2022-03-08 DIAGNOSIS — Z79891 Long term (current) use of opiate analgesic: Secondary | ICD-10-CM | POA: Diagnosis not present

## 2022-03-08 DIAGNOSIS — M47816 Spondylosis without myelopathy or radiculopathy, lumbar region: Secondary | ICD-10-CM | POA: Diagnosis not present

## 2022-03-08 DIAGNOSIS — G894 Chronic pain syndrome: Secondary | ICD-10-CM | POA: Diagnosis not present

## 2022-03-08 DIAGNOSIS — M461 Sacroiliitis, not elsewhere classified: Secondary | ICD-10-CM | POA: Diagnosis not present

## 2022-03-08 DIAGNOSIS — M48061 Spinal stenosis, lumbar region without neurogenic claudication: Secondary | ICD-10-CM | POA: Diagnosis not present

## 2022-03-08 DIAGNOSIS — Z1389 Encounter for screening for other disorder: Secondary | ICD-10-CM | POA: Diagnosis not present

## 2022-03-20 ENCOUNTER — Encounter (INDEPENDENT_AMBULATORY_CARE_PROVIDER_SITE_OTHER): Payer: Self-pay | Admitting: Internal Medicine

## 2022-03-22 DIAGNOSIS — I6523 Occlusion and stenosis of bilateral carotid arteries: Secondary | ICD-10-CM | POA: Diagnosis not present

## 2022-03-22 DIAGNOSIS — I10 Essential (primary) hypertension: Secondary | ICD-10-CM | POA: Diagnosis not present

## 2022-03-22 DIAGNOSIS — Z951 Presence of aortocoronary bypass graft: Secondary | ICD-10-CM | POA: Diagnosis not present

## 2022-03-22 DIAGNOSIS — I2510 Atherosclerotic heart disease of native coronary artery without angina pectoris: Secondary | ICD-10-CM | POA: Diagnosis not present

## 2022-03-22 DIAGNOSIS — E785 Hyperlipidemia, unspecified: Secondary | ICD-10-CM | POA: Diagnosis not present

## 2022-03-22 NOTE — Telephone Encounter (Signed)
From: [name]  To: Johny Shears, MD  Sent: 03/20/2022 10:22 AM PST  Subject: move    Hi Dr. Candiss Norse,  I have moved to [facility] and have started seeing Dr. Sharlynn Oliphant at [facility]. I just wanted to let you know and tell you how grateful I am for all of your care.  Thank you  Debra Bolton

## 2022-03-25 ENCOUNTER — Other Ambulatory Visit: Payer: Self-pay | Admitting: Legal Medicine

## 2022-04-05 DIAGNOSIS — M47816 Spondylosis without myelopathy or radiculopathy, lumbar region: Secondary | ICD-10-CM | POA: Diagnosis not present

## 2022-04-05 DIAGNOSIS — G894 Chronic pain syndrome: Secondary | ICD-10-CM | POA: Diagnosis not present

## 2022-04-05 DIAGNOSIS — M48061 Spinal stenosis, lumbar region without neurogenic claudication: Secondary | ICD-10-CM | POA: Diagnosis not present

## 2022-04-05 DIAGNOSIS — Z1389 Encounter for screening for other disorder: Secondary | ICD-10-CM | POA: Diagnosis not present

## 2022-04-05 DIAGNOSIS — Z79891 Long term (current) use of opiate analgesic: Secondary | ICD-10-CM | POA: Diagnosis not present

## 2022-04-05 DIAGNOSIS — M5136 Other intervertebral disc degeneration, lumbar region: Secondary | ICD-10-CM | POA: Diagnosis not present

## 2022-04-05 DIAGNOSIS — M461 Sacroiliitis, not elsewhere classified: Secondary | ICD-10-CM | POA: Diagnosis not present

## 2022-04-15 ENCOUNTER — Encounter: Payer: Self-pay | Admitting: Nurse Practitioner

## 2022-04-15 ENCOUNTER — Ambulatory Visit (INDEPENDENT_AMBULATORY_CARE_PROVIDER_SITE_OTHER): Payer: Medicare HMO | Admitting: Nurse Practitioner

## 2022-04-15 VITALS — BP 130/78 | HR 48 | Temp 97.3°F | Ht 64.0 in | Wt 107.2 lb

## 2022-04-15 DIAGNOSIS — B351 Tinea unguium: Secondary | ICD-10-CM

## 2022-04-15 DIAGNOSIS — J449 Chronic obstructive pulmonary disease, unspecified: Secondary | ICD-10-CM | POA: Diagnosis not present

## 2022-04-15 DIAGNOSIS — R001 Bradycardia, unspecified: Secondary | ICD-10-CM

## 2022-04-15 DIAGNOSIS — Z1231 Encounter for screening mammogram for malignant neoplasm of breast: Secondary | ICD-10-CM | POA: Diagnosis not present

## 2022-04-15 DIAGNOSIS — F17219 Nicotine dependence, cigarettes, with unspecified nicotine-induced disorders: Secondary | ICD-10-CM | POA: Diagnosis not present

## 2022-04-15 DIAGNOSIS — M810 Age-related osteoporosis without current pathological fracture: Secondary | ICD-10-CM

## 2022-04-15 DIAGNOSIS — I10 Essential (primary) hypertension: Secondary | ICD-10-CM | POA: Diagnosis not present

## 2022-04-15 DIAGNOSIS — E782 Mixed hyperlipidemia: Secondary | ICD-10-CM | POA: Diagnosis not present

## 2022-04-15 DIAGNOSIS — Z5320 Procedure and treatment not carried out because of patient's decision for unspecified reasons: Secondary | ICD-10-CM

## 2022-04-15 MED ORDER — METOPROLOL SUCCINATE ER 25 MG PO TB24: 25.0000 mg | ORAL_TABLET | Freq: Every day | ORAL | 3 refills | Status: DC

## 2022-04-15 MED ORDER — ALENDRONATE SODIUM 70 MG PO TABS: ORAL_TABLET | ORAL | 1 refills | Status: DC

## 2022-04-15 MED ORDER — TERBINAFINE HCL 250 MG PO TABS: 250.0000 mg | ORAL_TABLET | Freq: Every day | ORAL | 0 refills | Status: DC
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
## 2022-04-15 NOTE — Patient Instructions (Addendum)
We will call you with lab results, mammogram (Jan 10th, 2024) and lung cancer screening Decrease Metoprolol to 25 mg XL daily Monitor BP and pulse, keep log Return to office in 2 weeks for BP and pulse check, bring BP and pulse log Continue medications Begin Lamisil 250 mg daily for nail fungus Return in 3 months for follow-up labs for nail fungus treatment   Fungal Nail Infection A fungal nail infection is a common infection of the toenails or fingernails. This condition affects toenails more often than fingernails. It often affects the great, or big, toes. More than one nail may be infected. The condition can be passed from person to person (is contagious). What are the causes? This condition is caused by a fungus, such as yeast or molds. Several types of fungi can cause the infection. These fungi are common in moist and warm areas. If your hands or feet come into contact with the fungus, it may get into a crack in your fingernail or toenail or in the surrounding skin, and cause an infection. What increases the risk? The following factors may make you more likely to develop this condition: Being of older age. Having certain medical conditions, such as: Athlete's foot. Diabetes. Poor circulation. A weak body defense system (immune system). Walking barefoot in areas where the fungus thrives, such as showers or locker rooms. Wearing shoes and socks that cause your feet to sweat. Having a nail injury or a recent nail surgery. What are the signs or symptoms? Symptoms of this condition include: A pale spot on the nail. Thickening of the nail. A nail that becomes yellow, brown, or white. A brittle or ragged nail edge. A nail that has lifted away from the nail bed. How is this diagnosed? This condition is diagnosed with a physical exam. Your health care provider may take a scraping or clipping from your nail to test for the fungus. How is this treated? Treatment is not needed for mild  infections. If you have significant nail changes, treatment may include: Antifungal medicines taken by mouth (orally). You may need to take the medicine for several weeks or several months, and you may not see the results for a long time. These medicines can cause side effects. Ask your health care provider what problems to watch for. Antifungal nail polish or nail cream. These may be used along with oral antifungal medicines. Laser treatment of the nail. Surgery to remove the nail. This may be needed for the most severe infections. It can take a long time, usually up to a year, for the infection to go away. The infection may also come back. Follow these instructions at home: Medicines Take or apply over-the-counter and prescription medicines only as told by your health care provider. Ask your health care provider about using over-the-counter mentholated ointment on your nails. Nail care Trim your nails often. Wash and dry your hands and feet every day. Keep your feet dry. To do this: Wear absorbent socks, and change your socks frequently. Wear shoes that allow air to circulate, such as sandals or canvas tennis shoes. Throw out old shoes. If you go to a nail salon, make sure you choose one that uses clean instruments. Use antifungal foot powder on your feet and in your shoes. General instructions Do not share personal items, such as towels or nail clippers. Do not walk barefoot in shower rooms or locker rooms. Wear rubber gloves if you are working with your hands in wet areas. Keep all follow-up visits.  This is important. Contact a health care provider if: You have redness, pain, or pus near the toenail or fingernail. Your infection is not getting better, or it is getting worse after several months. You have more circulation problems near the toenail or fingernail. You have brown or black discoloration of the nail that spreads to the surrounding skin. Summary A fungal nail infection is a  common infection of the toenails or fingernails. Treatment is not needed for mild infections. If you have significant nail changes, treatment may include taking medicine orally and applying medicine to your nails. It can take a long time, usually up to a year, for the infection to go away. The infection may also come back. Take or apply over-the-counter and prescription medicines only as told by your health care provider. This information is not intended to replace advice given to you by your health care provider. Make sure you discuss any questions you have with your health care provider. Document Revised: 07/07/2020 Document Reviewed: 07/07/2020 Elsevier Patient Education  2023 [employer].

## 2022-04-16 LAB — CBC WITH DIFFERENTIAL/PLATELET
Basophils Absolute: 0 10*3/uL (ref 0.0–0.2)
Basos: 0 %
EOS (ABSOLUTE): 0.1 10*3/uL (ref 0.0–0.4)
Eos: 1 %
Hematocrit: 44.1 % (ref 34.0–46.6)
Hemoglobin: 15.1 g/dL (ref 11.1–15.9)
Immature Grans (Abs): 0 10*3/uL (ref 0.0–0.1)
Immature Granulocytes: 0 %
Lymphocytes Absolute: 1.9 10*3/uL (ref 0.7–3.1)
Lymphs: 22 %
MCH: 32.1 pg (ref 26.6–33.0)
MCHC: 34.2 g/dL (ref 31.5–35.7)
MCV: 94 fL (ref 79–97)
Monocytes Absolute: 0.5 10*3/uL (ref 0.1–0.9)
Monocytes: 6 %
Neutrophils Absolute: 6 10*3/uL (ref 1.4–7.0)
Neutrophils: 71 %
Platelets: 198 10*3/uL (ref 150–450)
RBC: 4.7 x10E6/uL (ref 3.77–5.28)
RDW: 11.9 % (ref 11.7–15.4)
WBC: 8.6 10*3/uL (ref 3.4–10.8)

## 2022-04-16 LAB — CARDIOVASCULAR RISK ASSESSMENT

## 2022-04-16 LAB — COMPREHENSIVE METABOLIC PANEL
ALT: 11 IU/L (ref 0–32)
AST: 15 IU/L (ref 0–40)
Albumin/Globulin Ratio: 1.9 (ref 1.2–2.2)
Albumin: 4.6 g/dL (ref 3.9–4.9)
Alkaline Phosphatase: 65 IU/L (ref 44–121)
BUN/Creatinine Ratio: 22 (ref 12–28)
BUN: 15 mg/dL (ref 8–27)
Bilirubin Total: 0.3 mg/dL (ref 0.0–1.2)
CO2: 23 mmol/L (ref 20–29)
Calcium: 9.2 mg/dL (ref 8.7–10.3)
Chloride: 104 mmol/L (ref 96–106)
Creatinine, Ser: 0.68 mg/dL (ref 0.57–1.00)
Globulin, Total: 2.4 g/dL (ref 1.5–4.5)
Glucose: 85 mg/dL (ref 70–99)
Potassium: 4.5 mmol/L (ref 3.5–5.2)
Sodium: 142 mmol/L (ref 134–144)
Total Protein: 7 g/dL (ref 6.0–8.5)
eGFR: 98 mL/min/{1.73_m2} (ref >59)

## 2022-04-16 LAB — LIPID PANEL
Chol/HDL Ratio: 2.1 ratio (ref 0.0–4.4)
Cholesterol, Total: 136 mg/dL (ref 100–199)
HDL: 66 mg/dL (ref >39)
LDL Chol Calc (NIH): 54 mg/dL (ref 0–99)
Triglycerides: 84 mg/dL (ref 0–149)
VLDL Cholesterol Cal: 16 mg/dL (ref 5–40)

## 2022-04-16 LAB — TSH: TSH: 0.618 u[IU]/mL (ref 0.450–4.500)

## 2022-04-16 LAB — T4, FREE: Free T4: 1.35 ng/dL (ref 0.82–1.77)

## 2022-04-23 ENCOUNTER — Encounter: Payer: Self-pay | Admitting: Hospital

## 2022-04-28 ENCOUNTER — Ambulatory Visit
Admission: RE | Admit: 2022-04-28 | Discharge: 2022-04-28 | Disposition: A | Discharge status: Home / Self Care | Payer: Medicare HMO | Source: Ambulatory Visit | Attending: Nurse Practitioner | Admitting: Nurse Practitioner

## 2022-04-28 DIAGNOSIS — I700 Atherosclerosis of aorta: Secondary | ICD-10-CM | POA: Diagnosis not present

## 2022-04-28 DIAGNOSIS — F17210 Nicotine dependence, cigarettes, uncomplicated: Secondary | ICD-10-CM | POA: Diagnosis not present

## 2022-04-28 DIAGNOSIS — Z87891 Personal history of nicotine dependence: Secondary | ICD-10-CM | POA: Diagnosis not present

## 2022-04-28 DIAGNOSIS — Z122 Encounter for screening for malignant neoplasm of respiratory organs: Secondary | ICD-10-CM | POA: Diagnosis not present

## 2022-04-28 DIAGNOSIS — Z1231 Encounter for screening mammogram for malignant neoplasm of breast: Secondary | ICD-10-CM

## 2022-04-28 DIAGNOSIS — J432 Centrilobular emphysema: Secondary | ICD-10-CM | POA: Diagnosis not present

## 2022-04-29 ENCOUNTER — Ambulatory Visit: Payer: Medicare HMO

## 2022-04-29 ENCOUNTER — Other Ambulatory Visit: Payer: Self-pay

## 2022-04-29 DIAGNOSIS — F17219 Nicotine dependence, cigarettes, with unspecified nicotine-induced disorders: Secondary | ICD-10-CM

## 2022-04-29 NOTE — Progress Notes (Signed)
Patient came in for BP check. Provider made aware of BP.  Per provider no medication change at this time, follow up as schedule.

## 2022-05-07 DIAGNOSIS — M461 Sacroiliitis, not elsewhere classified: Secondary | ICD-10-CM | POA: Diagnosis not present

## 2022-05-07 DIAGNOSIS — Z79891 Long term (current) use of opiate analgesic: Secondary | ICD-10-CM | POA: Diagnosis not present

## 2022-05-07 DIAGNOSIS — M48061 Spinal stenosis, lumbar region without neurogenic claudication: Secondary | ICD-10-CM | POA: Diagnosis not present

## 2022-05-07 DIAGNOSIS — M5136 Other intervertebral disc degeneration, lumbar region: Secondary | ICD-10-CM | POA: Diagnosis not present

## 2022-05-07 DIAGNOSIS — G894 Chronic pain syndrome: Secondary | ICD-10-CM | POA: Diagnosis not present

## 2022-05-07 DIAGNOSIS — M47816 Spondylosis without myelopathy or radiculopathy, lumbar region: Secondary | ICD-10-CM | POA: Diagnosis not present

## 2022-05-07 DIAGNOSIS — Z1389 Encounter for screening for other disorder: Secondary | ICD-10-CM | POA: Diagnosis not present

## 2022-05-11 ENCOUNTER — Other Ambulatory Visit: Payer: Self-pay | Admitting: Nurse Practitioner

## 2022-05-11 DIAGNOSIS — J449 Chronic obstructive pulmonary disease, unspecified: Secondary | ICD-10-CM

## 2022-05-12 ENCOUNTER — Other Ambulatory Visit: Payer: Self-pay

## 2022-06-08 DIAGNOSIS — M5136 Other intervertebral disc degeneration, lumbar region: Secondary | ICD-10-CM | POA: Diagnosis not present

## 2022-06-08 DIAGNOSIS — Z1389 Encounter for screening for other disorder: Secondary | ICD-10-CM | POA: Diagnosis not present

## 2022-06-08 DIAGNOSIS — G894 Chronic pain syndrome: Secondary | ICD-10-CM | POA: Diagnosis not present

## 2022-06-08 DIAGNOSIS — M47816 Spondylosis without myelopathy or radiculopathy, lumbar region: Secondary | ICD-10-CM | POA: Diagnosis not present

## 2022-06-08 DIAGNOSIS — M48061 Spinal stenosis, lumbar region without neurogenic claudication: Secondary | ICD-10-CM | POA: Diagnosis not present

## 2022-06-08 DIAGNOSIS — M461 Sacroiliitis, not elsewhere classified: Secondary | ICD-10-CM | POA: Diagnosis not present

## 2022-06-08 DIAGNOSIS — Z79891 Long term (current) use of opiate analgesic: Secondary | ICD-10-CM | POA: Diagnosis not present

## 2022-06-14 ENCOUNTER — Telehealth: Payer: Self-pay

## 2022-06-14 NOTE — Telephone Encounter (Signed)
[place] to schedule their annual wellness visit. Appointment made for 06/17/22.  Norton Blizzard, [place] ([facility])  [facility] [phone]

## 2022-06-17 ENCOUNTER — Other Ambulatory Visit: Payer: Self-pay | Admitting: Family Medicine

## 2022-06-17 ENCOUNTER — Ambulatory Visit (INDEPENDENT_AMBULATORY_CARE_PROVIDER_SITE_OTHER): Payer: Medicare HMO

## 2022-06-17 DIAGNOSIS — E782 Mixed hyperlipidemia: Secondary | ICD-10-CM

## 2022-06-17 DIAGNOSIS — Z0000 Encounter for general adult medical examination without abnormal findings: Secondary | ICD-10-CM | POA: Diagnosis not present
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
## 2022-06-17 NOTE — Patient Instructions (Signed)
Dorothy Parker , Thank you for taking time to come for your Medicare Wellness Visit. I appreciate your ongoing commitment to your health goals. Please review the following plan we discussed and let me know if I can assist you in the future.   Screening recommendations/referrals: Colonoscopy: Due - Declined Mammogram: next due 04/2023 Bone Density: up-to-date Recommended yearly ophthalmology/optometry visit for glaucoma screening and checkup Recommended yearly dental visit for hygiene and checkup  Vaccinations: Influenza vaccine: up-to-date Tdap vaccine: Declined Shingles vaccine: Declined     Preventive Care 40-64 Years, Female Preventive care refers to lifestyle choices and visits with your health care provider that can promote health and wellness. What does preventive care include? A yearly physical exam. This is also called an annual well check. Dental exams once or twice a year. Routine eye exams. Ask your health care provider how often you should have your eyes checked. Personal lifestyle choices, including: Daily care of your teeth and gums. Regular physical activity. Eating a healthy diet. Avoiding tobacco and drug use. Limiting alcohol use. Practicing safe sex. Taking low-dose aspirin daily starting at age 26. Taking vitamin and mineral supplements as recommended by your health care provider. What happens during an annual well check? The services and screenings done by your health care provider during your annual well check will depend on your age, overall health, lifestyle risk factors, and family history of disease. Counseling  Your health care provider may ask you questions about your: Alcohol use. Tobacco use. Drug use. Emotional well-being. Home and relationship well-being. Sexual activity. Eating habits. Work and work Statistician. Method of birth control. Menstrual cycle. Pregnancy history. Screening  You may have the following tests or measurements: Height,  weight, and BMI. Blood pressure. Lipid and cholesterol levels. These may be checked every 5 years, or more frequently if you are over 34 years old. Skin check. Lung cancer screening. You may have this screening every year starting at age 90 if you have a 30-pack-year history of smoking and currently smoke or have quit within the past 15 years. Fecal occult blood test (FOBT) of the stool. You may have this test every year starting at age 5. Flexible sigmoidoscopy or colonoscopy. You may have a sigmoidoscopy every 5 years or a colonoscopy every 10 years starting at age 4. Hepatitis C blood test. Hepatitis B blood test. Sexually transmitted disease (STD) testing. Diabetes screening. This is done by checking your blood sugar (glucose) after you have not eaten for a while (fasting). You may have this done every 1-3 years. Mammogram. This may be done every 1-2 years. Talk to your health care provider about when you should start having regular mammograms. This may depend on whether you have a family history of breast cancer. BRCA-related cancer screening. This may be done if you have a family history of breast, ovarian, tubal, or peritoneal cancers. Pelvic exam and Pap test. This may be done every 3 years starting at age 109. Starting at age 48, this may be done every 5 years if you have a Pap test in combination with an HPV test. Bone density scan. This is done to screen for osteoporosis. You may have this scan if you are at high risk for osteoporosis. Discuss your test results, treatment options, and if necessary, the need for more tests with your health care provider. Vaccines  Your health care provider may recommend certain vaccines, such as: Influenza vaccine. This is recommended every year. Tetanus, diphtheria, and acellular pertussis (Tdap, Td) vaccine. You may  need a Td booster every 10 years. Zoster vaccine. You may need this after age 61. Pneumococcal 13-valent conjugate (PCV13) vaccine. You  may need this if you have certain conditions and were not previously vaccinated. Pneumococcal polysaccharide (PPSV23) vaccine. You may need one or two doses if you smoke cigarettes or if you have certain conditions. Talk to your health care provider about which screenings and vaccines you need and how often you need them. This information is not intended to replace advice given to you by your health care provider. Make sure you discuss any questions you have with your health care provider. Document Released: 05/02/2015 Document Revised: 12/24/2015 Document Reviewed: 02/04/2015 Elsevier Interactive Patient Education  2017 [employer] Prevention in the Home Falls can cause injuries. They can happen to people of all ages. There are many things you can do to make your home safe and to help prevent falls. What can I do on the outside of my home? Regularly fix the edges of walkways and driveways and fix any cracks. Remove anything that might make you trip as you walk through a door, such as a raised step or threshold. Trim any bushes or trees on the path to your home. Use bright outdoor lighting. Clear any walking paths of anything that might make someone trip, such as rocks or tools. Regularly check to see if handrails are loose or broken. Make sure that both sides of any steps have handrails. Any raised decks and porches should have guardrails on the edges. Have any leaves, snow, or ice cleared regularly. Use sand or salt on walking paths during winter. Clean up any spills in your garage right away. This includes oil or grease spills. What can I do in the bathroom? Use night lights. Install grab bars by the toilet and in the tub and shower. Do not use towel bars as grab bars. Use non-skid mats or decals in the tub or shower. If you need to sit down in the shower, use a plastic, non-slip stool. Keep the floor dry. Clean up any water that spills on the floor as soon as it  happens. Remove soap buildup in the tub or shower regularly. Attach bath mats securely with double-sided non-slip rug tape. Do not have throw rugs and other things on the floor that can make you trip. What can I do in the bedroom? Use night lights. Make sure that you have a light by your bed that is easy to reach. Do not use any sheets or blankets that are too big for your bed. They should not hang down onto the floor. Have a firm chair that has side arms. You can use this for support while you get dressed. Do not have throw rugs and other things on the floor that can make you trip. What can I do in the kitchen? Clean up any spills right away. Avoid walking on wet floors. Keep items that you use a lot in easy-to-reach places. If you need to reach something above you, use a strong step stool that has a grab bar. Keep electrical cords out of the way. Do not use floor polish or wax that makes floors slippery. If you must use wax, use non-skid floor wax. Do not have throw rugs and other things on the floor that can make you trip. What can I do with my stairs? Do not leave any items on the stairs. Make sure that there are handrails on both sides of the stairs and  use them. Fix handrails that are broken or loose. Make sure that handrails are as long as the stairways. Check any carpeting to make sure that it is firmly attached to the stairs. Fix any carpet that is loose or worn. Avoid having throw rugs at the top or bottom of the stairs. If you do have throw rugs, attach them to the floor with carpet tape. Make sure that you have a light switch at the top of the stairs and the bottom of the stairs. If you do not have them, ask someone to add them for you. What else can I do to help prevent falls? Wear shoes that: Do not have high heels. Have rubber bottoms. Are comfortable and fit you well. Are closed at the toe. Do not wear sandals. If you use a stepladder: Make sure that it is fully opened.  Do not climb a closed stepladder. Make sure that both sides of the stepladder are locked into place. Ask someone to hold it for you, if possible. Clearly mark and make sure that you can see: Any grab bars or handrails. First and last steps. Where the edge of each step is. Use tools that help you move around (mobility aids) if they are needed. These include: Canes. Walkers. Scooters. Crutches. Turn on the lights when you go into a dark area. Replace any light bulbs as soon as they burn out. Set up your furniture so you have a clear path. Avoid moving your furniture around. If any of your floors are uneven, fix them. If there are any pets around you, be aware of where they are. Review your medicines with your doctor. Some medicines can make you feel dizzy. This can increase your chance of falling. Ask your doctor what other things that you can do to help prevent falls. This information is not intended to replace advice given to you by your health care provider. Make sure you discuss any questions you have with your health care provider. Document Released: 01/30/2009 Document Revised: 09/11/2015 Document Reviewed: 05/10/2014 Elsevier Interactive Patient Education  2017 [employer].

## 2022-06-28 ENCOUNTER — Other Ambulatory Visit: Payer: Self-pay | Admitting: Family Medicine

## 2022-07-05 DIAGNOSIS — M47816 Spondylosis without myelopathy or radiculopathy, lumbar region: Secondary | ICD-10-CM | POA: Diagnosis not present

## 2022-07-05 DIAGNOSIS — Z1389 Encounter for screening for other disorder: Secondary | ICD-10-CM | POA: Diagnosis not present

## 2022-07-05 DIAGNOSIS — G894 Chronic pain syndrome: Secondary | ICD-10-CM | POA: Diagnosis not present

## 2022-07-05 DIAGNOSIS — M5136 Other intervertebral disc degeneration, lumbar region: Secondary | ICD-10-CM | POA: Diagnosis not present

## 2022-07-05 DIAGNOSIS — Z79891 Long term (current) use of opiate analgesic: Secondary | ICD-10-CM | POA: Diagnosis not present

## 2022-07-05 DIAGNOSIS — M461 Sacroiliitis, not elsewhere classified: Secondary | ICD-10-CM | POA: Diagnosis not present

## 2022-07-05 DIAGNOSIS — M48061 Spinal stenosis, lumbar region without neurogenic claudication: Secondary | ICD-10-CM | POA: Diagnosis not present

## 2022-07-06 ENCOUNTER — Other Ambulatory Visit: Payer: Self-pay | Admitting: Family Medicine

## 2022-07-06 DIAGNOSIS — J449 Chronic obstructive pulmonary disease, unspecified: Secondary | ICD-10-CM

## 2022-07-19 ENCOUNTER — Ambulatory Visit: Payer: Medicare HMO | Admitting: Nurse Practitioner

## 2022-07-27 ENCOUNTER — Ambulatory Visit (INDEPENDENT_AMBULATORY_CARE_PROVIDER_SITE_OTHER): Payer: Medicare HMO | Admitting: Family Medicine

## 2022-07-27 ENCOUNTER — Encounter: Payer: Self-pay | Admitting: Family Medicine

## 2022-07-27 VITALS — BP 132/70 | HR 60 | Temp 97.4°F | Ht 64.0 in | Wt 112.0 lb

## 2022-07-27 DIAGNOSIS — I6523 Occlusion and stenosis of bilateral carotid arteries: Secondary | ICD-10-CM

## 2022-07-27 DIAGNOSIS — B351 Tinea unguium: Secondary | ICD-10-CM

## 2022-07-27 DIAGNOSIS — E782 Mixed hyperlipidemia: Secondary | ICD-10-CM

## 2022-07-27 DIAGNOSIS — J449 Chronic obstructive pulmonary disease, unspecified: Secondary | ICD-10-CM

## 2022-07-27 DIAGNOSIS — F17219 Nicotine dependence, cigarettes, with unspecified nicotine-induced disorders: Secondary | ICD-10-CM

## 2022-07-27 DIAGNOSIS — I10 Essential (primary) hypertension: Secondary | ICD-10-CM | POA: Diagnosis not present

## 2022-07-27 MED ORDER — BUPROPION HCL ER (XL) 150 MG PO TB24: ORAL_TABLET | ORAL | 0 refills | Status: DC

## 2022-07-27 MED ORDER — TERBINAFINE HCL 250 MG PO TABS: 250.0000 mg | ORAL_TABLET | Freq: Every day | ORAL | 0 refills | Status: DC

## 2022-07-27 MED ORDER — NICOTINE 7 MG/24HR TD PT24: 7.0000 mg | MEDICATED_PATCH | Freq: Every day | TRANSDERMAL | 0 refills | Status: DC

## 2022-07-27 MED ORDER — NICOTINE 14 MG/24HR TD PT24: 14.0000 mg | MEDICATED_PATCH | Freq: Every day | TRANSDERMAL | 0 refills | Status: DC

## 2022-07-27 MED ORDER — NICOTINE 21 MG/24HR TD PT24: 21.0000 mg | MEDICATED_PATCH | Freq: Every day | TRANSDERMAL | 0 refills | Status: DC

## 2022-07-27 NOTE — Assessment & Plan Note (Addendum)
Well controlled.  No changes to medicines.  Continue to work on eating a healthy diet and exercise.  Increase crestor to 10 mg daily.

## 2022-07-27 NOTE — Assessment & Plan Note (Signed)
Well controlled.  No medication changes recommended. Continue healthy diet and exercise.  
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
## 2022-07-28 ENCOUNTER — Encounter: Payer: Self-pay | Admitting: Family Medicine

## 2022-07-28 LAB — COMPREHENSIVE METABOLIC PANEL
ALT: 13 IU/L (ref 0–32)
AST: 16 IU/L (ref 0–40)
Albumin/Globulin Ratio: 1.8 (ref 1.2–2.2)
Albumin: 4.6 g/dL (ref 3.9–4.9)
Alkaline Phosphatase: 70 IU/L (ref 44–121)
BUN/Creatinine Ratio: 33 — ABNORMAL HIGH (ref 12–28)
BUN: 24 mg/dL (ref 8–27)
Bilirubin Total: 0.3 mg/dL (ref 0.0–1.2)
CO2: 23 mmol/L (ref 20–29)
Calcium: 9.4 mg/dL (ref 8.7–10.3)
Chloride: 102 mmol/L (ref 96–106)
Creatinine, Ser: 0.73 mg/dL (ref 0.57–1.00)
Globulin, Total: 2.5 g/dL (ref 1.5–4.5)
Glucose: 65 mg/dL — ABNORMAL LOW (ref 70–99)
Potassium: 4.5 mmol/L (ref 3.5–5.2)
Sodium: 141 mmol/L (ref 134–144)
Total Protein: 7.1 g/dL (ref 6.0–8.5)
eGFR: 92 mL/min/{1.73_m2} (ref >59)

## 2022-07-28 NOTE — Assessment & Plan Note (Signed)
Recommend cessation. Start wellbutrin xl 150 mg once in am x 1 week, then increase to 300 mg once in am.   Nicoderm patches. Tapering doses over 2 months.

## 2022-08-02 DIAGNOSIS — G894 Chronic pain syndrome: Secondary | ICD-10-CM | POA: Diagnosis not present

## 2022-08-02 DIAGNOSIS — M47816 Spondylosis without myelopathy or radiculopathy, lumbar region: Secondary | ICD-10-CM | POA: Diagnosis not present

## 2022-08-02 DIAGNOSIS — Z79891 Long term (current) use of opiate analgesic: Secondary | ICD-10-CM | POA: Diagnosis not present

## 2022-08-02 DIAGNOSIS — M48061 Spinal stenosis, lumbar region without neurogenic claudication: Secondary | ICD-10-CM | POA: Diagnosis not present

## 2022-08-02 DIAGNOSIS — M461 Sacroiliitis, not elsewhere classified: Secondary | ICD-10-CM | POA: Diagnosis not present

## 2022-08-02 DIAGNOSIS — M5136 Other intervertebral disc degeneration, lumbar region: Secondary | ICD-10-CM | POA: Diagnosis not present

## 2022-08-02 DIAGNOSIS — Z1389 Encounter for screening for other disorder: Secondary | ICD-10-CM | POA: Diagnosis not present

## 2022-08-04 ENCOUNTER — Other Ambulatory Visit: Payer: Self-pay | Admitting: Family Medicine

## 2022-08-04 DIAGNOSIS — J449 Chronic obstructive pulmonary disease, unspecified: Secondary | ICD-10-CM

## 2022-08-23 ENCOUNTER — Other Ambulatory Visit: Payer: Self-pay | Admitting: Family Medicine

## 2022-08-23 DIAGNOSIS — J449 Chronic obstructive pulmonary disease, unspecified: Secondary | ICD-10-CM

## 2022-08-30 DIAGNOSIS — M461 Sacroiliitis, not elsewhere classified: Secondary | ICD-10-CM | POA: Diagnosis not present

## 2022-08-30 DIAGNOSIS — M47816 Spondylosis without myelopathy or radiculopathy, lumbar region: Secondary | ICD-10-CM | POA: Diagnosis not present

## 2022-08-30 DIAGNOSIS — Z1389 Encounter for screening for other disorder: Secondary | ICD-10-CM | POA: Diagnosis not present

## 2022-08-30 DIAGNOSIS — M5136 Other intervertebral disc degeneration, lumbar region: Secondary | ICD-10-CM | POA: Diagnosis not present

## 2022-08-30 DIAGNOSIS — M48061 Spinal stenosis, lumbar region without neurogenic claudication: Secondary | ICD-10-CM | POA: Diagnosis not present

## 2022-08-30 DIAGNOSIS — Z79891 Long term (current) use of opiate analgesic: Secondary | ICD-10-CM | POA: Diagnosis not present

## 2022-08-30 DIAGNOSIS — G894 Chronic pain syndrome: Secondary | ICD-10-CM | POA: Diagnosis not present

## 2022-09-22 ENCOUNTER — Encounter (INDEPENDENT_AMBULATORY_CARE_PROVIDER_SITE_OTHER): Payer: Self-pay | Admitting: Hospital

## 2022-09-22 DIAGNOSIS — I10 Essential (primary) hypertension: Secondary | ICD-10-CM | POA: Diagnosis not present

## 2022-09-22 DIAGNOSIS — E785 Hyperlipidemia, unspecified: Secondary | ICD-10-CM | POA: Diagnosis not present

## 2022-09-22 DIAGNOSIS — I6523 Occlusion and stenosis of bilateral carotid arteries: Secondary | ICD-10-CM | POA: Diagnosis not present

## 2022-09-22 DIAGNOSIS — Z951 Presence of aortocoronary bypass graft: Secondary | ICD-10-CM | POA: Diagnosis not present

## 2022-09-22 DIAGNOSIS — I2510 Atherosclerotic heart disease of native coronary artery without angina pectoris: Secondary | ICD-10-CM | POA: Diagnosis not present

## 2022-10-05 DIAGNOSIS — Z79891 Long term (current) use of opiate analgesic: Secondary | ICD-10-CM | POA: Diagnosis not present

## 2022-10-05 DIAGNOSIS — G894 Chronic pain syndrome: Secondary | ICD-10-CM | POA: Diagnosis not present

## 2022-10-05 DIAGNOSIS — Z1389 Encounter for screening for other disorder: Secondary | ICD-10-CM | POA: Diagnosis not present

## 2022-10-05 DIAGNOSIS — M47816 Spondylosis without myelopathy or radiculopathy, lumbar region: Secondary | ICD-10-CM | POA: Diagnosis not present

## 2022-10-05 DIAGNOSIS — M5136 Other intervertebral disc degeneration, lumbar region: Secondary | ICD-10-CM | POA: Diagnosis not present

## 2022-10-05 DIAGNOSIS — M48061 Spinal stenosis, lumbar region without neurogenic claudication: Secondary | ICD-10-CM | POA: Diagnosis not present

## 2022-10-05 DIAGNOSIS — M461 Sacroiliitis, not elsewhere classified: Secondary | ICD-10-CM | POA: Diagnosis not present

## 2022-11-03 DIAGNOSIS — M48061 Spinal stenosis, lumbar region without neurogenic claudication: Secondary | ICD-10-CM | POA: Diagnosis not present

## 2022-11-03 DIAGNOSIS — Z1389 Encounter for screening for other disorder: Secondary | ICD-10-CM | POA: Diagnosis not present

## 2022-11-03 DIAGNOSIS — G894 Chronic pain syndrome: Secondary | ICD-10-CM | POA: Diagnosis not present

## 2022-11-03 DIAGNOSIS — M47816 Spondylosis without myelopathy or radiculopathy, lumbar region: Secondary | ICD-10-CM | POA: Diagnosis not present

## 2022-11-03 DIAGNOSIS — Z79891 Long term (current) use of opiate analgesic: Secondary | ICD-10-CM | POA: Diagnosis not present

## 2022-11-03 DIAGNOSIS — M5136 Other intervertebral disc degeneration, lumbar region: Secondary | ICD-10-CM | POA: Diagnosis not present

## 2022-11-03 DIAGNOSIS — M461 Sacroiliitis, not elsewhere classified: Secondary | ICD-10-CM | POA: Diagnosis not present

## 2022-11-11 ENCOUNTER — Other Ambulatory Visit: Payer: Self-pay | Admitting: Family Medicine

## 2022-11-11 DIAGNOSIS — I10 Essential (primary) hypertension: Secondary | ICD-10-CM

## 2022-11-15 NOTE — Assessment & Plan Note (Signed)
Well controlled.  No medication changes recommended. Continue Metoprolol 25 mg daily, Lisinopril 20 mg daily Continue healthy diet and exercise.

## 2022-11-15 NOTE — Assessment & Plan Note (Addendum)
Continue crestor and aspirin.  Recommend quit smoking.

## 2022-11-15 NOTE — Assessment & Plan Note (Signed)
Well controlled.  No changes to medicines.  Continue to work on eating a healthy diet and exercise. Crestor to 10 mg daily.

## 2022-11-15 NOTE — Assessment & Plan Note (Signed)
Well controlled.  No medication changes recommended. Change stiolto to breztri 2 puffs twice daily. Continue healthy diet and exercise.
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
## 2022-11-16 ENCOUNTER — Encounter: Payer: Self-pay | Admitting: Family Medicine

## 2022-11-16 ENCOUNTER — Ambulatory Visit (INDEPENDENT_AMBULATORY_CARE_PROVIDER_SITE_OTHER): Payer: Medicare HMO | Admitting: Family Medicine

## 2022-11-16 VITALS — BP 130/80 | HR 80 | Temp 97.2°F | Ht 64.0 in | Wt 108.0 lb

## 2022-11-16 DIAGNOSIS — I6523 Occlusion and stenosis of bilateral carotid arteries: Secondary | ICD-10-CM | POA: Diagnosis not present

## 2022-11-16 DIAGNOSIS — E782 Mixed hyperlipidemia: Secondary | ICD-10-CM | POA: Diagnosis not present

## 2022-11-16 DIAGNOSIS — M545 Low back pain, unspecified: Secondary | ICD-10-CM

## 2022-11-16 DIAGNOSIS — Z13820 Encounter for screening for osteoporosis: Secondary | ICD-10-CM | POA: Diagnosis not present

## 2022-11-16 DIAGNOSIS — F17200 Nicotine dependence, unspecified, uncomplicated: Secondary | ICD-10-CM

## 2022-11-16 DIAGNOSIS — G8929 Other chronic pain: Secondary | ICD-10-CM

## 2022-11-16 DIAGNOSIS — J449 Chronic obstructive pulmonary disease, unspecified: Secondary | ICD-10-CM

## 2022-11-16 DIAGNOSIS — M810 Age-related osteoporosis without current pathological fracture: Secondary | ICD-10-CM

## 2022-11-16 DIAGNOSIS — Z780 Asymptomatic menopausal state: Secondary | ICD-10-CM

## 2022-11-16 DIAGNOSIS — F17219 Nicotine dependence, cigarettes, with unspecified nicotine-induced disorders: Secondary | ICD-10-CM | POA: Diagnosis not present

## 2022-11-16 DIAGNOSIS — I10 Essential (primary) hypertension: Secondary | ICD-10-CM | POA: Diagnosis not present

## 2022-11-16 DIAGNOSIS — E440 Moderate protein-calorie malnutrition: Secondary | ICD-10-CM

## 2022-11-16 MED ORDER — NICOTINE 7 MG/24HR TD PT24: 7.0000 mg | MEDICATED_PATCH | Freq: Every day | TRANSDERMAL | 0 refills | Status: DC

## 2022-11-16 MED ORDER — NICOTINE 21 MG/24HR TD PT24: 21.0000 mg | MEDICATED_PATCH | Freq: Every day | TRANSDERMAL | 0 refills | Status: DC

## 2022-11-16 MED ORDER — NICOTINE 14 MG/24HR TD PT24: 14.0000 mg | MEDICATED_PATCH | Freq: Every day | TRANSDERMAL | 0 refills | Status: DC

## 2022-11-16 MED ORDER — BUPROPION HCL ER (XL) 150 MG PO TB24: ORAL_TABLET | ORAL | 0 refills | Status: DC

## 2022-11-20 DIAGNOSIS — G8929 Other chronic pain: Secondary | ICD-10-CM | POA: Insufficient documentation

## 2022-11-20 NOTE — Assessment & Plan Note (Signed)
Recommend 3 meals a day. Recommend protein shakes.

## 2022-11-20 NOTE — Assessment & Plan Note (Signed)
Start wellbutrin xl and nicoderm patches tapering.

## 2022-11-20 NOTE — Assessment & Plan Note (Signed)
Continue alendronate.

## 2022-11-20 NOTE — Assessment & Plan Note (Signed)
Skelaxin 800 mg daily, Oxycodone 7.5-325 mg TID sometimes QID- see[facility]

## 2022-11-22 ENCOUNTER — Telehealth: Payer: Self-pay | Admitting: Family Medicine

## 2022-11-22 NOTE — Telephone Encounter (Signed)
   Dorothy Parker has been scheduled for the following appointment:  WHAT: Bone Density WHERE: [facility] DATE: 12/17/22 TIME: 9:00 AM Check-In  Patient has been made aware.

## 2022-11-23 ENCOUNTER — Other Ambulatory Visit: Payer: Self-pay

## 2022-11-23 DIAGNOSIS — Z13820 Encounter for screening for osteoporosis: Secondary | ICD-10-CM

## 2022-11-24 ENCOUNTER — Telehealth: Payer: Self-pay

## 2022-11-24 NOTE — Telephone Encounter (Signed)
PA submitted and approved via covermymeds for bupropion.

## 2022-11-25 ENCOUNTER — Other Ambulatory Visit: Payer: Self-pay

## 2022-11-25 DIAGNOSIS — I6521 Occlusion and stenosis of right carotid artery: Secondary | ICD-10-CM | POA: Diagnosis not present

## 2022-11-25 MED ORDER — BREZTRI AEROSPHERE 160-9-4.8 MCG/ACT IN AERO: 2.0000 | INHALATION_SPRAY | Freq: Two times a day (BID) | RESPIRATORY_TRACT | 3 refills | Status: DC

## 2022-12-01 ENCOUNTER — Other Ambulatory Visit: Payer: Self-pay | Admitting: Family Medicine

## 2022-12-01 DIAGNOSIS — M810 Age-related osteoporosis without current pathological fracture: Secondary | ICD-10-CM

## 2022-12-08 DIAGNOSIS — M5136 Other intervertebral disc degeneration, lumbar region: Secondary | ICD-10-CM | POA: Diagnosis not present

## 2022-12-08 DIAGNOSIS — Z79891 Long term (current) use of opiate analgesic: Secondary | ICD-10-CM | POA: Diagnosis not present

## 2022-12-08 DIAGNOSIS — M47816 Spondylosis without myelopathy or radiculopathy, lumbar region: Secondary | ICD-10-CM | POA: Diagnosis not present

## 2022-12-08 DIAGNOSIS — M461 Sacroiliitis, not elsewhere classified: Secondary | ICD-10-CM | POA: Diagnosis not present

## 2022-12-08 DIAGNOSIS — M48061 Spinal stenosis, lumbar region without neurogenic claudication: Secondary | ICD-10-CM | POA: Diagnosis not present

## 2022-12-08 DIAGNOSIS — Z1389 Encounter for screening for other disorder: Secondary | ICD-10-CM | POA: Diagnosis not present

## 2022-12-08 DIAGNOSIS — G894 Chronic pain syndrome: Secondary | ICD-10-CM | POA: Diagnosis not present

## 2022-12-16 ENCOUNTER — Other Ambulatory Visit: Payer: Self-pay | Admitting: Family Medicine

## 2022-12-16 DIAGNOSIS — E782 Mixed hyperlipidemia: Secondary | ICD-10-CM

## 2022-12-17 DIAGNOSIS — M810 Age-related osteoporosis without current pathological fracture: Secondary | ICD-10-CM | POA: Diagnosis not present

## 2022-12-17 DIAGNOSIS — N959 Unspecified menopausal and perimenopausal disorder: Secondary | ICD-10-CM | POA: Diagnosis not present

## 2023-01-05 DIAGNOSIS — M461 Sacroiliitis, not elsewhere classified: Secondary | ICD-10-CM | POA: Diagnosis not present

## 2023-01-05 DIAGNOSIS — Z79891 Long term (current) use of opiate analgesic: Secondary | ICD-10-CM | POA: Diagnosis not present

## 2023-01-05 DIAGNOSIS — M48061 Spinal stenosis, lumbar region without neurogenic claudication: Secondary | ICD-10-CM | POA: Diagnosis not present

## 2023-01-05 DIAGNOSIS — M47816 Spondylosis without myelopathy or radiculopathy, lumbar region: Secondary | ICD-10-CM | POA: Diagnosis not present

## 2023-01-05 DIAGNOSIS — G894 Chronic pain syndrome: Secondary | ICD-10-CM | POA: Diagnosis not present

## 2023-01-05 DIAGNOSIS — M7060 Trochanteric bursitis, unspecified hip: Secondary | ICD-10-CM | POA: Diagnosis not present

## 2023-01-05 DIAGNOSIS — M5136 Other intervertebral disc degeneration, lumbar region: Secondary | ICD-10-CM | POA: Diagnosis not present

## 2023-01-05 DIAGNOSIS — Z1389 Encounter for screening for other disorder: Secondary | ICD-10-CM | POA: Diagnosis not present

## 2023-01-21 ENCOUNTER — Other Ambulatory Visit: Payer: Self-pay | Admitting: Family Medicine

## 2023-01-21 DIAGNOSIS — Z1211 Encounter for screening for malignant neoplasm of colon: Secondary | ICD-10-CM

## 2023-01-21 DIAGNOSIS — Z1212 Encounter for screening for malignant neoplasm of rectum: Secondary | ICD-10-CM

## 2023-02-01 DIAGNOSIS — H524 Presbyopia: Secondary | ICD-10-CM | POA: Diagnosis not present

## 2023-02-01 DIAGNOSIS — H5203 Hypermetropia, bilateral: Secondary | ICD-10-CM | POA: Diagnosis not present

## 2023-02-01 DIAGNOSIS — H52223 Regular astigmatism, bilateral: Secondary | ICD-10-CM | POA: Diagnosis not present

## 2023-02-01 DIAGNOSIS — H25813 Combined forms of age-related cataract, bilateral: Secondary | ICD-10-CM | POA: Diagnosis not present

## 2023-02-02 ENCOUNTER — Telehealth: Payer: Self-pay

## 2023-02-02 DIAGNOSIS — M810 Age-related osteoporosis without current pathological fracture: Secondary | ICD-10-CM

## 2023-02-02 NOTE — Telephone Encounter (Signed)
   Patient: Dorothy Parker  DOB: 06-15-1960  MRN: [id number]  Evenity benefits verified by [employer] on: 01/18/2023 however [employer] wouldn't release benefit information. PA done via Cover My Meds, EVENITY DENIED.   Reason: Denied today by [facility] NCPDP 2017 We cover this drug when our criteria are met. The unmet criteria are: patient has tried or cannot use: zoledronic acid or Prolia (denosumab) (step therapy requirement). This decision was from [employer]&ap[employer];s EVENITY ([id number]) Pharmacy Coverage Policy.  Jacklynn Bue, LPN

## 2023-02-04 MED ORDER — DENOSUMAB 60 MG/ML ~~LOC~~ SOSY: 60.0000 mg | PREFILLED_SYRINGE | SUBCUTANEOUS | 0 refills | Status: DC

## 2023-02-05 DIAGNOSIS — Z1212 Encounter for screening for malignant neoplasm of rectum: Secondary | ICD-10-CM | POA: Diagnosis not present

## 2023-02-05 DIAGNOSIS — Z1211 Encounter for screening for malignant neoplasm of colon: Secondary | ICD-10-CM | POA: Diagnosis not present

## 2023-02-08 ENCOUNTER — Telehealth: Payer: Self-pay

## 2023-02-08 NOTE — Telephone Encounter (Signed)
Rhonda from [facility] pharmacy called and stated that the patient's Prolia was being shipped out today and will arrive on October 24th and if we do not received the medication by the end of the day on the 24th we have 24 hours to call [facility] and make a claim for the patient because they stated they are not held responsible after that if something happens to medications.

## 2023-02-09 DIAGNOSIS — M48061 Spinal stenosis, lumbar region without neurogenic claudication: Secondary | ICD-10-CM | POA: Diagnosis not present

## 2023-02-09 DIAGNOSIS — G894 Chronic pain syndrome: Secondary | ICD-10-CM | POA: Diagnosis not present

## 2023-02-09 DIAGNOSIS — M7060 Trochanteric bursitis, unspecified hip: Secondary | ICD-10-CM | POA: Diagnosis not present

## 2023-02-09 DIAGNOSIS — Z1389 Encounter for screening for other disorder: Secondary | ICD-10-CM | POA: Diagnosis not present

## 2023-02-09 DIAGNOSIS — M47816 Spondylosis without myelopathy or radiculopathy, lumbar region: Secondary | ICD-10-CM | POA: Diagnosis not present

## 2023-02-09 DIAGNOSIS — M461 Sacroiliitis, not elsewhere classified: Secondary | ICD-10-CM | POA: Diagnosis not present

## 2023-02-09 DIAGNOSIS — Z79891 Long term (current) use of opiate analgesic: Secondary | ICD-10-CM | POA: Diagnosis not present

## 2023-02-10 ENCOUNTER — Telehealth: Payer: Self-pay

## 2023-02-10 NOTE — Telephone Encounter (Signed)
Received patient's Prolia from [facility] pharmacy today and called and informed patient and scheduled her to come in for nurse visit for it to be given.  1 prolia injection  Lot# [id number] Exp: 06/16/25  NDC 16109-604-54

## 2023-02-12 LAB — COLOGUARD: COLOGUARD: POSITIVE — AB

## 2023-02-14 ENCOUNTER — Ambulatory Visit (INDEPENDENT_AMBULATORY_CARE_PROVIDER_SITE_OTHER): Payer: Medicare HMO

## 2023-02-14 DIAGNOSIS — M810 Age-related osteoporosis without current pathological fracture: Secondary | ICD-10-CM | POA: Diagnosis not present

## 2023-02-14 MED ORDER — DENOSUMAB 60 MG/ML ~~LOC~~ SOSY
60.0000 mg | PREFILLED_SYRINGE | Freq: Once | SUBCUTANEOUS | Status: AC
  Administered 2023-02-14: 60 mg via SUBCUTANEOUS

## 2023-02-14 NOTE — Progress Notes (Signed)
   Patient: Dorothy Parker  DOB: [birth year]  MRN: [id number]    Visit Date: 02/14/2023    MAKARI SIEK presents today for her initial Prolia injection.  1 x 60 mg Single-Dose Prefilled Syringe was given SQ in the right arm.  Patient tolerated the injection well and has no questions.  The next Prolia injection will be due in six months.  Administrations This Visit     denosumab (PROLIA) injection 60 mg     Admin Date 02/14/2023 Action Given Dose 60 mg Route Subcutaneous Documented By Precious Reel, CMA              Precious Reel, CMA

## 2023-02-22 ENCOUNTER — Other Ambulatory Visit: Payer: Self-pay | Admitting: Family Medicine

## 2023-02-22 DIAGNOSIS — M810 Age-related osteoporosis without current pathological fracture: Secondary | ICD-10-CM

## 2023-02-23 ENCOUNTER — Telehealth: Payer: Self-pay

## 2023-02-23 NOTE — Telephone Encounter (Signed)
Returned call to patient to let her know that the Alendondrate was declined because she is on Prolia and she shouldn't be on both medications.   Copied from CRM [id number]. Topic: Clinical - Prescription Issue >> Feb 23, 2023 12:44 PM Dennison Nancy wrote: Reason for CRM: patient want to know why her medication refill is been denied for the alendronace is generic  fosamax

## 2023-03-08 DIAGNOSIS — M7062 Trochanteric bursitis, left hip: Secondary | ICD-10-CM | POA: Diagnosis not present

## 2023-03-15 DIAGNOSIS — M7060 Trochanteric bursitis, unspecified hip: Secondary | ICD-10-CM | POA: Diagnosis not present

## 2023-03-15 DIAGNOSIS — M461 Sacroiliitis, not elsewhere classified: Secondary | ICD-10-CM | POA: Diagnosis not present

## 2023-03-15 DIAGNOSIS — M48061 Spinal stenosis, lumbar region without neurogenic claudication: Secondary | ICD-10-CM | POA: Diagnosis not present

## 2023-03-15 DIAGNOSIS — Z79891 Long term (current) use of opiate analgesic: Secondary | ICD-10-CM | POA: Diagnosis not present

## 2023-03-15 DIAGNOSIS — G894 Chronic pain syndrome: Secondary | ICD-10-CM | POA: Diagnosis not present

## 2023-03-15 DIAGNOSIS — Z1389 Encounter for screening for other disorder: Secondary | ICD-10-CM | POA: Diagnosis not present

## 2023-03-15 DIAGNOSIS — M47816 Spondylosis without myelopathy or radiculopathy, lumbar region: Secondary | ICD-10-CM | POA: Diagnosis not present

## 2023-04-18 DIAGNOSIS — E785 Hyperlipidemia, unspecified: Secondary | ICD-10-CM | POA: Diagnosis not present

## 2023-04-18 DIAGNOSIS — I2510 Atherosclerotic heart disease of native coronary artery without angina pectoris: Secondary | ICD-10-CM | POA: Diagnosis not present

## 2023-04-18 DIAGNOSIS — Z951 Presence of aortocoronary bypass graft: Secondary | ICD-10-CM | POA: Diagnosis not present

## 2023-04-18 DIAGNOSIS — I6523 Occlusion and stenosis of bilateral carotid arteries: Secondary | ICD-10-CM | POA: Diagnosis not present

## 2023-04-18 DIAGNOSIS — F17200 Nicotine dependence, unspecified, uncomplicated: Secondary | ICD-10-CM | POA: Diagnosis not present

## 2023-04-18 DIAGNOSIS — I10 Essential (primary) hypertension: Secondary | ICD-10-CM | POA: Diagnosis not present

## 2023-04-26 DIAGNOSIS — Z1389 Encounter for screening for other disorder: Secondary | ICD-10-CM | POA: Diagnosis not present

## 2023-04-26 DIAGNOSIS — G894 Chronic pain syndrome: Secondary | ICD-10-CM | POA: Diagnosis not present

## 2023-04-26 DIAGNOSIS — Z79891 Long term (current) use of opiate analgesic: Secondary | ICD-10-CM | POA: Diagnosis not present

## 2023-04-26 DIAGNOSIS — M47816 Spondylosis without myelopathy or radiculopathy, lumbar region: Secondary | ICD-10-CM | POA: Diagnosis not present

## 2023-04-26 DIAGNOSIS — M7060 Trochanteric bursitis, unspecified hip: Secondary | ICD-10-CM | POA: Diagnosis not present

## 2023-04-26 DIAGNOSIS — M461 Sacroiliitis, not elsewhere classified: Secondary | ICD-10-CM | POA: Diagnosis not present

## 2023-04-26 DIAGNOSIS — M48061 Spinal stenosis, lumbar region without neurogenic claudication: Secondary | ICD-10-CM | POA: Diagnosis not present

## 2023-05-05 ENCOUNTER — Other Ambulatory Visit: Payer: Self-pay | Admitting: Family Medicine

## 2023-05-05 DIAGNOSIS — I6523 Occlusion and stenosis of bilateral carotid arteries: Secondary | ICD-10-CM | POA: Diagnosis not present

## 2023-05-05 DIAGNOSIS — Z720 Tobacco use: Secondary | ICD-10-CM | POA: Diagnosis not present

## 2023-05-05 DIAGNOSIS — I10 Essential (primary) hypertension: Secondary | ICD-10-CM

## 2023-05-05 DIAGNOSIS — E785 Hyperlipidemia, unspecified: Secondary | ICD-10-CM | POA: Diagnosis not present
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
## 2023-05-19 ENCOUNTER — Ambulatory Visit: Payer: Medicare HMO | Admitting: Family Medicine

## 2023-05-19 ENCOUNTER — Encounter: Payer: Self-pay | Admitting: Family Medicine

## 2023-05-19 VITALS — BP 138/84 | HR 67 | Temp 98.2°F | Ht 64.0 in | Wt 101.0 lb

## 2023-05-19 DIAGNOSIS — F17219 Nicotine dependence, cigarettes, with unspecified nicotine-induced disorders: Secondary | ICD-10-CM

## 2023-05-19 DIAGNOSIS — G43109 Migraine with aura, not intractable, without status migrainosus: Secondary | ICD-10-CM | POA: Diagnosis not present

## 2023-05-19 DIAGNOSIS — I10 Essential (primary) hypertension: Secondary | ICD-10-CM

## 2023-05-19 DIAGNOSIS — G4489 Other headache syndrome: Secondary | ICD-10-CM | POA: Diagnosis not present

## 2023-05-19 DIAGNOSIS — E782 Mixed hyperlipidemia: Secondary | ICD-10-CM

## 2023-05-19 DIAGNOSIS — H539 Unspecified visual disturbance: Secondary | ICD-10-CM

## 2023-05-19 DIAGNOSIS — Z23 Encounter for immunization: Secondary | ICD-10-CM | POA: Diagnosis not present

## 2023-05-19 DIAGNOSIS — R270 Ataxia, unspecified: Secondary | ICD-10-CM | POA: Diagnosis not present

## 2023-05-19 DIAGNOSIS — J449 Chronic obstructive pulmonary disease, unspecified: Secondary | ICD-10-CM

## 2023-05-19 MED ORDER — UBRELVY 100 MG PO TABS: ORAL_TABLET | ORAL | 0 refills | Status: DC

## 2023-05-19 NOTE — Assessment & Plan Note (Signed)
Worsening headaches both migraines and otherwise.  Recommend CT scan of the brain.

## 2023-05-19 NOTE — Assessment & Plan Note (Signed)
Start with a CT scan of the brain to rule out stroke.  Recommend possible follow-up with MRI.

## 2023-05-19 NOTE — Patient Instructions (Signed)
Ubrelvy 100 mg once daily as needed for migraine.  Take as soon as you develop an aura.  Ordering a CT scan of your brain due to increasing migraines, dizziness, and visual disturbances.

## 2023-05-19 NOTE — Assessment & Plan Note (Signed)
Started on Ubrelvy 100 mg once daily as needed for sleep for migraine/aura.  Patient is not a candidate for triptans due to her history of coronary artery disease.

## 2023-05-19 NOTE — Assessment & Plan Note (Signed)
Differential diagnosis includes TIA versus ocular migraine.  Ordering CT scan of the brain.

## 2023-05-20 LAB — CBC WITH DIFFERENTIAL/PLATELET
Basophils Absolute: 0 10*3/uL (ref 0.0–0.2)
Basos: 0 %
EOS (ABSOLUTE): 0.2 10*3/uL (ref 0.0–0.4)
Eos: 2 %
Hematocrit: 44.4 % (ref 34.0–46.6)
Hemoglobin: 14.9 g/dL (ref 11.1–15.9)
Immature Grans (Abs): 0 10*3/uL (ref 0.0–0.1)
Immature Granulocytes: 0 %
Lymphocytes Absolute: 1.8 10*3/uL (ref 0.7–3.1)
Lymphs: 27 %
MCH: 32.7 pg (ref 26.6–33.0)
MCHC: 33.6 g/dL (ref 31.5–35.7)
MCV: 97 fL (ref 79–97)
Monocytes Absolute: 0.5 10*3/uL (ref 0.1–0.9)
Monocytes: 8 %
Neutrophils Absolute: 4.2 10*3/uL (ref 1.4–7.0)
Neutrophils: 63 %
Platelets: 240 10*3/uL (ref 150–450)
RBC: 4.56 x10E6/uL (ref 3.77–5.28)
RDW: 12.4 % (ref 11.7–15.4)
WBC: 6.8 10*3/uL (ref 3.4–10.8)

## 2023-05-20 LAB — COMPREHENSIVE METABOLIC PANEL
ALT: 11 [IU]/L (ref 0–32)
AST: 16 [IU]/L (ref 0–40)
Albumin: 4.6 g/dL (ref 3.9–4.9)
Alkaline Phosphatase: 70 [IU]/L (ref 44–121)
BUN/Creatinine Ratio: 24 (ref 12–28)
BUN: 16 mg/dL (ref 8–27)
Bilirubin Total: 0.4 mg/dL (ref 0.0–1.2)
CO2: 20 mmol/L (ref 20–29)
Calcium: 9.4 mg/dL (ref 8.7–10.3)
Chloride: 105 mmol/L (ref 96–106)
Creatinine, Ser: 0.66 mg/dL (ref 0.57–1.00)
Globulin, Total: 2.4 g/dL (ref 1.5–4.5)
Glucose: 91 mg/dL (ref 70–99)
Potassium: 4.4 mmol/L (ref 3.5–5.2)
Sodium: 144 mmol/L (ref 134–144)
Total Protein: 7 g/dL (ref 6.0–8.5)
eGFR: 98 mL/min/{1.73_m2} (ref >59)

## 2023-05-20 LAB — LIPID PANEL
Chol/HDL Ratio: 2.2 {ratio} (ref 0.0–4.4)
Cholesterol, Total: 137 mg/dL (ref 100–199)
HDL: 61 mg/dL (ref >39)
LDL Chol Calc (NIH): 44 mg/dL (ref 0–99)
Triglycerides: 199 mg/dL — ABNORMAL HIGH (ref 0–149)
VLDL Cholesterol Cal: 32 mg/dL (ref 5–40)

## 2023-05-22 NOTE — Assessment & Plan Note (Signed)
Recommend smoking cessation.

## 2023-05-22 NOTE — Assessment & Plan Note (Signed)
Blood pressure slightly elevated on current regimen of Lisinopril 20mg  daily. -Continue current medication regimen and monitor blood pressure.

## 2023-05-22 NOTE — Assessment & Plan Note (Signed)
Well controlled.  No medication changes recommended. Change stiolto to breztri 2 puffs twice daily. Continue healthy diet and exercise.

## 2023-05-22 NOTE — Assessment & Plan Note (Signed)
Well controlled.  No changes to medicines.  Continue to work on eating a healthy diet and exercise. Crestor to 10 mg daily.

## 2023-05-23 ENCOUNTER — Telehealth: Payer: Self-pay

## 2023-05-23 ENCOUNTER — Ambulatory Visit (HOSPITAL_BASED_OUTPATIENT_CLINIC_OR_DEPARTMENT_OTHER)
Admission: RE | Admit: 2023-05-23 | Discharge: 2023-05-23 | Disposition: A | Discharge status: Home / Self Care | Payer: Medicare HMO | Source: Ambulatory Visit | Attending: Family Medicine | Admitting: Family Medicine

## 2023-05-23 DIAGNOSIS — R519 Headache, unspecified: Secondary | ICD-10-CM | POA: Diagnosis not present

## 2023-05-23 DIAGNOSIS — R270 Ataxia, unspecified: Secondary | ICD-10-CM | POA: Diagnosis not present

## 2023-05-23 DIAGNOSIS — H539 Unspecified visual disturbance: Secondary | ICD-10-CM | POA: Diagnosis not present

## 2023-05-23 DIAGNOSIS — R29818 Other symptoms and signs involving the nervous system: Secondary | ICD-10-CM | POA: Diagnosis not present

## 2023-05-23 DIAGNOSIS — I6782 Cerebral ischemia: Secondary | ICD-10-CM | POA: Diagnosis not present

## 2023-05-23 NOTE — Telephone Encounter (Signed)
PA submitted and approved via covermymeds for ubrelvy. ?

## 2023-05-25 ENCOUNTER — Other Ambulatory Visit: Payer: Self-pay

## 2023-05-25 DIAGNOSIS — M47816 Spondylosis without myelopathy or radiculopathy, lumbar region: Secondary | ICD-10-CM | POA: Diagnosis not present

## 2023-05-25 DIAGNOSIS — M7060 Trochanteric bursitis, unspecified hip: Secondary | ICD-10-CM | POA: Diagnosis not present

## 2023-05-25 DIAGNOSIS — Z1389 Encounter for screening for other disorder: Secondary | ICD-10-CM | POA: Diagnosis not present

## 2023-05-25 DIAGNOSIS — M461 Sacroiliitis, not elsewhere classified: Secondary | ICD-10-CM | POA: Diagnosis not present

## 2023-05-25 DIAGNOSIS — G894 Chronic pain syndrome: Secondary | ICD-10-CM | POA: Diagnosis not present

## 2023-05-25 DIAGNOSIS — M48061 Spinal stenosis, lumbar region without neurogenic claudication: Secondary | ICD-10-CM | POA: Diagnosis not present

## 2023-05-25 DIAGNOSIS — Z79891 Long term (current) use of opiate analgesic: Secondary | ICD-10-CM | POA: Diagnosis not present

## 2023-05-25 MED ORDER — FENOFIBRATE 160 MG PO TABS: 160.0000 mg | ORAL_TABLET | Freq: Every day | ORAL | 1 refills | Status: DC

## 2023-05-25 NOTE — Telephone Encounter (Signed)
 Copied from CRM [id number]. Topic: General - Call Back - No Documentation >> May 25, 2023 10:18 AM Curlee DEL wrote: Reason for CRM: Patient calling Jon back to let her know that she is agreeable to the medication - fenofibrate . It can be called into the pharmacy:  [facility] - [place], [place] - [address]  Phone: [phone] Fax: [phone]  She's going to be going out for another doctor's visit and would like to pick it up at the pharmacy if possible while she is out.

## 2023-05-27 NOTE — Telephone Encounter (Unsigned)
 Copied from CRM [id number]. Topic: Clinical - Lab/Test Results >> May 27, 2023  8:12 AM Dorothy Parker wrote: Reason for CRM: Patient wants to discuss of CT done yesterday. Callback [phone]. Please call cell if no answer at home number [phone] mobile

## 2023-06-02 ENCOUNTER — Telehealth: Payer: Self-pay

## 2023-06-02 ENCOUNTER — Encounter: Payer: Self-pay | Admitting: Family Medicine

## 2023-06-02 NOTE — Telephone Encounter (Signed)
Dr. Sedalia Muta, This patient called today and spoke with one of the agents over the phone. Ms. Fairbank was wanting to know her CT results the patient told by the agent that she has been calling but nobody has called her back. I notified the agent that I can see the images however I do not see the report on the CT.

## 2023-06-02 NOTE — Telephone Encounter (Signed)
Called patient made her aware, results have not can in but we will give her a call as soon as they come in

## 2023-06-02 NOTE — Telephone Encounter (Signed)
Copied from CRM [id number]. Topic: Clinical - Lab/Test Results >> Jun 02, 2023  9:35 AM Geroge Baseman wrote: Reason for CRM: Patient still waiting on results from imaging, contacted [facility] and they stated they will let dr cox know she is waiting on these.Patient would like a call back about her results when they are available at either number on her chart.

## 2023-06-03 ENCOUNTER — Encounter: Payer: Self-pay | Admitting: Family Medicine

## 2023-06-09 ENCOUNTER — Other Ambulatory Visit: Payer: Self-pay | Admitting: Family Medicine

## 2023-06-09 DIAGNOSIS — E782 Mixed hyperlipidemia: Secondary | ICD-10-CM

## 2023-06-10 MED ORDER — TRIAMCINOLONE ACETONIDE 0.1 % EX CREA: TOPICAL_CREAM | Freq: Two times a day (BID) | CUTANEOUS | 0 refills | Status: DC

## 2023-06-22 DIAGNOSIS — G894 Chronic pain syndrome: Secondary | ICD-10-CM | POA: Diagnosis not present

## 2023-06-22 DIAGNOSIS — M47816 Spondylosis without myelopathy or radiculopathy, lumbar region: Secondary | ICD-10-CM | POA: Diagnosis not present

## 2023-06-22 DIAGNOSIS — Z79891 Long term (current) use of opiate analgesic: Secondary | ICD-10-CM | POA: Diagnosis not present

## 2023-06-22 DIAGNOSIS — M48061 Spinal stenosis, lumbar region without neurogenic claudication: Secondary | ICD-10-CM | POA: Diagnosis not present

## 2023-06-22 DIAGNOSIS — M7060 Trochanteric bursitis, unspecified hip: Secondary | ICD-10-CM | POA: Diagnosis not present

## 2023-06-22 DIAGNOSIS — M461 Sacroiliitis, not elsewhere classified: Secondary | ICD-10-CM | POA: Diagnosis not present

## 2023-06-22 DIAGNOSIS — Z1389 Encounter for screening for other disorder: Secondary | ICD-10-CM | POA: Diagnosis not present

## 2023-06-30 ENCOUNTER — Ambulatory Visit (INDEPENDENT_AMBULATORY_CARE_PROVIDER_SITE_OTHER): Payer: Medicare HMO

## 2023-06-30 VITALS — BP 130/80 | HR 62 | Ht 64.0 in | Wt 101.0 lb

## 2023-06-30 DIAGNOSIS — Z87891 Personal history of nicotine dependence: Secondary | ICD-10-CM | POA: Diagnosis not present

## 2023-06-30 DIAGNOSIS — Z1231 Encounter for screening mammogram for malignant neoplasm of breast: Secondary | ICD-10-CM

## 2023-06-30 DIAGNOSIS — Z0000 Encounter for general adult medical examination without abnormal findings: Secondary | ICD-10-CM | POA: Diagnosis not present
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
## 2023-06-30 NOTE — Patient Instructions (Signed)
 Ms. Dorothy Parker , Thank you for taking time to come for your Medicare Wellness Visit. I appreciate your ongoing commitment to your health goals. Please review the following plan we discussed and let me know if I can assist you in the future.    This is a list of the screening recommended for you and due dates:  Health Maintenance  Topic Date Due   Screening for Lung Cancer  04/29/2023   Mammogram  04/29/2023   DTaP/Tdap/Td vaccine (1 - Tdap) 06/29/2024*   COVID-19 Vaccine (1 - 2024-25 season) 06/29/2024*   Zoster (Shingles) Vaccine (1 of 2) 06/29/2024*   Medicare Annual Wellness Visit  06/29/2024   DEXA scan (bone density measurement)  12/20/2024   Cologuard (Stool DNA test)  02/04/2026   Pneumococcal Vaccination  Completed   Flu Shot  Completed   HPV Vaccine  Aged Out   Colon Cancer Screening  Discontinued   Hepatitis C Screening  Discontinued   HIV Screening  Discontinued  *Topic was postponed. The date shown is not the original due date.    Preventive Care 40-64 Years, Female Preventive care refers to lifestyle choices and visits with your health care provider that can promote health and wellness. What does preventive care include? A yearly physical exam. This is also called an annual well check. Dental exams once or twice a year. Routine eye exams. Ask your health care provider how often you should have your eyes checked. Personal lifestyle choices, including: Daily care of your teeth and gums. Regular physical activity. Eating a healthy diet. Avoiding tobacco and drug use. Limiting alcohol use. Practicing safe sex. Taking low-dose aspirin daily starting at age 20. Taking vitamin and mineral supplements as recommended by your health care provider. What happens during an annual well check? The services and screenings done by your health care provider during your annual well check will depend on your age, overall health, lifestyle risk factors, and family history of  disease. Counseling  Your health care provider may ask you questions about your: Alcohol use. Tobacco use. Drug use. Emotional well-being. Home and relationship well-being. Sexual activity. Eating habits. Work and work Astronomer. Method of birth control. Menstrual cycle. Pregnancy history. Screening  You may have the following tests or measurements: Height, weight, and BMI. Blood pressure. Lipid and cholesterol levels. These may be checked every 5 years, or more frequently if you are over 80 years old. Skin check. Lung cancer screening. You may have this screening every year starting at age 46 if you have a 30-pack-year history of smoking and currently smoke or have quit within the past 15 years. Fecal occult blood test (FOBT) of the stool. You may have this test every year starting at age 31. Flexible sigmoidoscopy or colonoscopy. You may have a sigmoidoscopy every 5 years or a colonoscopy every 10 years starting at age 63. Hepatitis C blood test. Hepatitis B blood test. Sexually transmitted disease (STD) testing. Diabetes screening. This is done by checking your blood sugar (glucose) after you have not eaten for a while (fasting). You may have this done every 1-3 years. Mammogram. This may be done every 1-2 years. Talk to your health care provider about when you should start having regular mammograms. This may depend on whether you have a family history of breast cancer. BRCA-related cancer screening. This may be done if you have a family history of breast, ovarian, tubal, or peritoneal cancers. Pelvic exam and Pap test. This may be done every 3 years starting at age  21. Starting at age 72, this may be done every 5 years if you have a Pap test in combination with an HPV test. Bone density scan. This is done to screen for osteoporosis. You may have this scan if you are at high risk for osteoporosis. Discuss your test results, treatment options, and if necessary, the need for more  tests with your health care provider. Vaccines  Your health care provider may recommend certain vaccines, such as: Influenza vaccine. This is recommended every year. Tetanus, diphtheria, and acellular pertussis (Tdap, Td) vaccine. You may need a Td booster every 10 years. Zoster vaccine. You may need this after age 41. Pneumococcal 13-valent conjugate (PCV13) vaccine. You may need this if you have certain conditions and were not previously vaccinated. Pneumococcal polysaccharide (PPSV23) vaccine. You may need one or two doses if you smoke cigarettes or if you have certain conditions. Talk to your health care provider about which screenings and vaccines you need and how often you need them. This information is not intended to replace advice given to you by your health care provider. Make sure you discuss any questions you have with your health care provider. Document Released: 05/02/2015 Document Revised: 12/24/2015 Document Reviewed: 02/04/2015 Elsevier Interactive Patient Education  2017 [employer].    Fall Prevention in the Home Falls can cause injuries. They can happen to people of all ages. There are many things you can do to make your home safe and to help prevent falls. What can I do on the outside of my home? Regularly fix the edges of walkways and driveways and fix any cracks. Remove anything that might make you trip as you walk through a door, such as a raised step or threshold. Trim any bushes or trees on the path to your home. Use bright outdoor lighting. Clear any walking paths of anything that might make someone trip, such as rocks or tools. Regularly check to see if handrails are loose or broken. Make sure that both sides of any steps have handrails. Any raised decks and porches should have guardrails on the edges. Have any leaves, snow, or ice cleared regularly. Use sand or salt on walking paths during winter. Clean up any spills in your garage right away. This includes  oil or grease spills. What can I do in the bathroom? Use night lights. Install grab bars by the toilet and in the tub and shower. Do not use towel bars as grab bars. Use non-skid mats or decals in the tub or shower. If you need to sit down in the shower, use a plastic, non-slip stool. Keep the floor dry. Clean up any water that spills on the floor as soon as it happens. Remove soap buildup in the tub or shower regularly. Attach bath mats securely with double-sided non-slip rug tape. Do not have throw rugs and other things on the floor that can make you trip. What can I do in the bedroom? Use night lights. Make sure that you have a light by your bed that is easy to reach. Do not use any sheets or blankets that are too big for your bed. They should not hang down onto the floor. Have a firm chair that has side arms. You can use this for support while you get dressed. Do not have throw rugs and other things on the floor that can make you trip. What can I do in the kitchen? Clean up any spills right away. Avoid walking on wet floors. Keep items that you  use a lot in easy-to-reach places. If you need to reach something above you, use a strong step stool that has a grab bar. Keep electrical cords out of the way. Do not use floor polish or wax that makes floors slippery. If you must use wax, use non-skid floor wax. Do not have throw rugs and other things on the floor that can make you trip. What can I do with my stairs? Do not leave any items on the stairs. Make sure that there are handrails on both sides of the stairs and use them. Fix handrails that are broken or loose. Make sure that handrails are as long as the stairways. Check any carpeting to make sure that it is firmly attached to the stairs. Fix any carpet that is loose or worn. Avoid having throw rugs at the top or bottom of the stairs. If you do have throw rugs, attach them to the floor with carpet tape. Make sure that you have a light  switch at the top of the stairs and the bottom of the stairs. If you do not have them, ask someone to add them for you. What else can I do to help prevent falls? Wear shoes that: Do not have high heels. Have rubber bottoms. Are comfortable and fit you well. Are closed at the toe. Do not wear sandals. If you use a stepladder: Make sure that it is fully opened. Do not climb a closed stepladder. Make sure that both sides of the stepladder are locked into place. Ask someone to hold it for you, if possible. Clearly mark and make sure that you can see: Any grab bars or handrails. First and last steps. Where the edge of each step is. Use tools that help you move around (mobility aids) if they are needed. These include: Canes. Walkers. Scooters. Crutches. Turn on the lights when you go into a dark area. Replace any light bulbs as soon as they burn out. Set up your furniture so you have a clear path. Avoid moving your furniture around. If any of your floors are uneven, fix them. If there are any pets around you, be aware of where they are. Review your medicines with your doctor. Some medicines can make you feel dizzy. This can increase your chance of falling. Ask your doctor what other things that you can do to help prevent falls. This information is not intended to replace advice given to you by your health care provider. Make sure you discuss any questions you have with your health care provider. Document Released: 01/30/2009 Document Revised: 09/11/2015 Document Reviewed: 05/10/2014 Elsevier Interactive Patient Education  2017 [employer].

## 2023-07-05 ENCOUNTER — Ambulatory Visit (HOSPITAL_BASED_OUTPATIENT_CLINIC_OR_DEPARTMENT_OTHER)
Admission: RE | Admit: 2023-07-05 | Discharge: 2023-07-05 | Disposition: A | Discharge status: Home / Self Care | Source: Ambulatory Visit | Attending: Family Medicine | Admitting: Family Medicine

## 2023-07-05 ENCOUNTER — Encounter (HOSPITAL_BASED_OUTPATIENT_CLINIC_OR_DEPARTMENT_OTHER): Payer: Self-pay | Admitting: Radiology

## 2023-07-05 DIAGNOSIS — F17210 Nicotine dependence, cigarettes, uncomplicated: Secondary | ICD-10-CM | POA: Diagnosis not present

## 2023-07-05 DIAGNOSIS — Z1231 Encounter for screening mammogram for malignant neoplasm of breast: Secondary | ICD-10-CM

## 2023-07-05 DIAGNOSIS — Z87891 Personal history of nicotine dependence: Secondary | ICD-10-CM

## 2023-07-07 ENCOUNTER — Encounter: Payer: Self-pay | Admitting: Family Medicine

## 2023-07-08 ENCOUNTER — Other Ambulatory Visit: Payer: Self-pay | Admitting: Family Medicine

## 2023-07-08 DIAGNOSIS — G4489 Other headache syndrome: Secondary | ICD-10-CM

## 2023-07-08 MED ORDER — UBRELVY 100 MG PO TABS
ORAL_TABLET | ORAL | 0 refills | Status: DC
Start: 2023-07-08 — End: 2023-08-08

## 2023-07-18 ENCOUNTER — Other Ambulatory Visit: Payer: Self-pay

## 2023-07-18 DIAGNOSIS — M810 Age-related osteoporosis without current pathological fracture: Secondary | ICD-10-CM

## 2023-07-18 MED ORDER — DENOSUMAB 60 MG/ML ~~LOC~~ SOSY
60.0000 mg | PREFILLED_SYRINGE | Freq: Once | SUBCUTANEOUS | Status: AC
  Administered 2023-08-23: 60 mg via SUBCUTANEOUS

## 2023-07-18 MED ORDER — DENOSUMAB 60 MG/ML ~~LOC~~ SOSY: 60.0000 mg | PREFILLED_SYRINGE | SUBCUTANEOUS | 0 refills | Status: AC

## 2023-07-21 DIAGNOSIS — Z79891 Long term (current) use of opiate analgesic: Secondary | ICD-10-CM | POA: Diagnosis not present

## 2023-07-21 DIAGNOSIS — M47816 Spondylosis without myelopathy or radiculopathy, lumbar region: Secondary | ICD-10-CM | POA: Diagnosis not present

## 2023-07-21 DIAGNOSIS — G894 Chronic pain syndrome: Secondary | ICD-10-CM | POA: Diagnosis not present

## 2023-07-21 DIAGNOSIS — Z1389 Encounter for screening for other disorder: Secondary | ICD-10-CM | POA: Diagnosis not present

## 2023-07-21 DIAGNOSIS — M461 Sacroiliitis, not elsewhere classified: Secondary | ICD-10-CM | POA: Diagnosis not present

## 2023-07-21 DIAGNOSIS — M48061 Spinal stenosis, lumbar region without neurogenic claudication: Secondary | ICD-10-CM | POA: Diagnosis not present

## 2023-07-21 DIAGNOSIS — M7060 Trochanteric bursitis, unspecified hip: Secondary | ICD-10-CM | POA: Diagnosis not present

## 2023-07-27 ENCOUNTER — Other Ambulatory Visit: Payer: Self-pay | Admitting: Family Medicine

## 2023-07-27 DIAGNOSIS — I10 Essential (primary) hypertension: Secondary | ICD-10-CM

## 2023-07-31 ENCOUNTER — Encounter: Payer: Self-pay | Admitting: Family Medicine

## 2023-08-08 ENCOUNTER — Other Ambulatory Visit: Payer: Self-pay

## 2023-08-08 DIAGNOSIS — G4489 Other headache syndrome: Secondary | ICD-10-CM

## 2023-08-08 MED ORDER — UBRELVY 100 MG PO TABS
ORAL_TABLET | ORAL | 0 refills | Status: DC
Start: 2023-08-08 — End: 2023-08-15

## 2023-08-15 ENCOUNTER — Other Ambulatory Visit: Payer: Self-pay | Admitting: Family Medicine

## 2023-08-15 DIAGNOSIS — G4489 Other headache syndrome: Secondary | ICD-10-CM

## 2023-08-23 ENCOUNTER — Encounter: Payer: Self-pay | Admitting: Family Medicine

## 2023-08-23 ENCOUNTER — Ambulatory Visit (INDEPENDENT_AMBULATORY_CARE_PROVIDER_SITE_OTHER): Payer: Medicare HMO | Admitting: Family Medicine

## 2023-08-23 VITALS — BP 128/76 | HR 54 | Temp 97.8°F | Ht 64.0 in | Wt 101.0 lb

## 2023-08-23 DIAGNOSIS — J449 Chronic obstructive pulmonary disease, unspecified: Secondary | ICD-10-CM | POA: Diagnosis not present

## 2023-08-23 DIAGNOSIS — I10 Essential (primary) hypertension: Secondary | ICD-10-CM | POA: Diagnosis not present

## 2023-08-23 DIAGNOSIS — F17219 Nicotine dependence, cigarettes, with unspecified nicotine-induced disorders: Secondary | ICD-10-CM

## 2023-08-23 DIAGNOSIS — M810 Age-related osteoporosis without current pathological fracture: Secondary | ICD-10-CM | POA: Diagnosis not present

## 2023-08-23 DIAGNOSIS — G4489 Other headache syndrome: Secondary | ICD-10-CM | POA: Diagnosis not present

## 2023-08-23 DIAGNOSIS — E782 Mixed hyperlipidemia: Secondary | ICD-10-CM

## 2023-08-23 MED ORDER — BREZTRI AEROSPHERE 160-9-4.8 MCG/ACT IN AERO: 2.0000 | INHALATION_SPRAY | Freq: Two times a day (BID) | RESPIRATORY_TRACT | 2 refills | Status: DC

## 2023-08-23 MED ORDER — DENOSUMAB 60 MG/ML ~~LOC~~ SOSY
60.0000 mg | PREFILLED_SYRINGE | Freq: Once | SUBCUTANEOUS | Status: AC
  Administered 2024-02-27: 60 mg via SUBCUTANEOUS

## 2023-08-23 MED ORDER — UBRELVY 100 MG PO TABS: ORAL_TABLET | ORAL | 3 refills | Status: DC
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
## 2023-08-24 ENCOUNTER — Encounter: Payer: Self-pay | Admitting: Family Medicine

## 2023-08-24 ENCOUNTER — Telehealth: Payer: Self-pay

## 2023-08-24 LAB — COMPREHENSIVE METABOLIC PANEL WITH GFR
ALT: 9 IU/L (ref 0–32)
AST: 18 IU/L (ref 0–40)
Albumin: 4.7 g/dL (ref 3.9–4.9)
Alkaline Phosphatase: 47 IU/L (ref 44–121)
BUN/Creatinine Ratio: 23 (ref 12–28)
BUN: 18 mg/dL (ref 8–27)
Bilirubin Total: 0.4 mg/dL (ref 0.0–1.2)
CO2: 21 mmol/L (ref 20–29)
Calcium: 9.4 mg/dL (ref 8.7–10.3)
Chloride: 104 mmol/L (ref 96–106)
Creatinine, Ser: 0.79 mg/dL (ref 0.57–1.00)
Globulin, Total: 2.4 g/dL (ref 1.5–4.5)
Glucose: 84 mg/dL (ref 70–99)
Potassium: 4.5 mmol/L (ref 3.5–5.2)
Sodium: 142 mmol/L (ref 134–144)
Total Protein: 7.1 g/dL (ref 6.0–8.5)
eGFR: 83 mL/min/{1.73_m2} (ref >59)

## 2023-08-24 LAB — CBC WITH DIFFERENTIAL/PLATELET
Basophils Absolute: 0 10*3/uL (ref 0.0–0.2)
Basos: 1 %
EOS (ABSOLUTE): 0.1 10*3/uL (ref 0.0–0.4)
Eos: 1 %
Hematocrit: 41.1 % (ref 34.0–46.6)
Hemoglobin: 13.6 g/dL (ref 11.1–15.9)
Immature Grans (Abs): 0 10*3/uL (ref 0.0–0.1)
Immature Granulocytes: 0 %
Lymphocytes Absolute: 2 10*3/uL (ref 0.7–3.1)
Lymphs: 24 %
MCH: 32.2 pg (ref 26.6–33.0)
MCHC: 33.1 g/dL (ref 31.5–35.7)
MCV: 97 fL (ref 79–97)
Monocytes Absolute: 0.6 10*3/uL (ref 0.1–0.9)
Monocytes: 7 %
Neutrophils Absolute: 5.6 10*3/uL (ref 1.4–7.0)
Neutrophils: 67 %
Platelets: 285 10*3/uL (ref 150–450)
RBC: 4.23 x10E6/uL (ref 3.77–5.28)
RDW: 12.1 % (ref 11.7–15.4)
WBC: 8.3 10*3/uL (ref 3.4–10.8)

## 2023-08-24 LAB — LIPID PANEL
Chol/HDL Ratio: 2.3 ratio (ref 0.0–4.4)
Cholesterol, Total: 151 mg/dL (ref 100–199)
HDL: 65 mg/dL (ref >39)
LDL Chol Calc (NIH): 63 mg/dL (ref 0–99)
Triglycerides: 133 mg/dL (ref 0–149)
VLDL Cholesterol Cal: 23 mg/dL (ref 5–40)

## 2023-08-24 NOTE — Telephone Encounter (Signed)
 PA for Ubrelvy  has been approved 12.31.2025.

## 2023-08-24 NOTE — Assessment & Plan Note (Signed)
 The current medical regimen is effective; continue present plan and medications. Continue diet and exercise.  Lisinopril  20 mg daily

## 2023-08-24 NOTE — Assessment & Plan Note (Addendum)
 The current medical regimen is effective; continue present plan and medications.  Continue Prolia  every 6  months.

## 2023-08-24 NOTE — Assessment & Plan Note (Addendum)
 The current medical regimen is effective; continue present plan and medications.  Breztri  2 puffs twice daily,

## 2023-08-24 NOTE — Assessment & Plan Note (Addendum)
 Well controlled.  No changes to medicines.  Continue to work on eating a healthy diet and exercise.  Crestor  to 5 mg daily,Fenofibrate  160 mg and ASA 81 mg

## 2023-08-24 NOTE — Assessment & Plan Note (Signed)
 Recommend smoking cessation.

## 2023-08-24 NOTE — Assessment & Plan Note (Signed)
 The current medical regimen is effective; continue present plan and medications.  Ubrelvy  100 mg

## 2023-08-30 DIAGNOSIS — G894 Chronic pain syndrome: Secondary | ICD-10-CM | POA: Diagnosis not present

## 2023-08-30 DIAGNOSIS — M47816 Spondylosis without myelopathy or radiculopathy, lumbar region: Secondary | ICD-10-CM | POA: Diagnosis not present

## 2023-09-08 ENCOUNTER — Telehealth: Payer: Self-pay

## 2023-09-08 NOTE — Telephone Encounter (Signed)
 Copied from CRM [id number]. Topic: Appointments - Scheduling Inquiry for Clinic >> Sep 07, 2023  5:06 PM DeAngela L wrote: Reason for CRM: [name] calling with [facility] to report that patient might want to schedule an appt after speaking with her today and the pt states she uses her inhaler very often she believes the pt might need an appt to get prescribed a rescue inhaler she is having breathing concerns she might  Pt number [phone] (M)

## 2023-09-08 NOTE — Telephone Encounter (Signed)
 Patient states that she will wait until her next appointment to talk about the inhaler. She will call us  if she needs an appointment.

## 2023-09-16 ENCOUNTER — Other Ambulatory Visit: Payer: Self-pay | Admitting: Family Medicine

## 2023-09-16 DIAGNOSIS — E782 Mixed hyperlipidemia: Secondary | ICD-10-CM

## 2023-09-22 ENCOUNTER — Telehealth: Payer: Self-pay

## 2023-09-22 ENCOUNTER — Other Ambulatory Visit: Payer: Self-pay

## 2023-09-22 DIAGNOSIS — J449 Chronic obstructive pulmonary disease, unspecified: Secondary | ICD-10-CM

## 2023-09-22 MED ORDER — BREZTRI AEROSPHERE 160-9-4.8 MCG/ACT IN AERO: 2.0000 | INHALATION_SPRAY | Freq: Two times a day (BID) | RESPIRATORY_TRACT | 2 refills | Status: DC

## 2023-09-22 NOTE — Telephone Encounter (Signed)
 Copied from CRM [id number]. Topic: Clinical - Prescription Issue >> Sep 22, 2023 10:14 AM Oddis Bench wrote: Reason for CRM: Patient is calling to request that she has more than refill's on her script for budeson-glycopyrrolate-formoterol (BREZTRI  AEROSPHERE) 160-9-4.8 MCG/ACT AERO inhaler.

## 2023-10-03 DIAGNOSIS — M47816 Spondylosis without myelopathy or radiculopathy, lumbar region: Secondary | ICD-10-CM | POA: Diagnosis not present

## 2023-10-11 ENCOUNTER — Other Ambulatory Visit (HOSPITAL_BASED_OUTPATIENT_CLINIC_OR_DEPARTMENT_OTHER): Payer: Self-pay

## 2023-10-11 MED ORDER — OXYCODONE HCL 10 MG PO TABS
10.0000 mg | ORAL_TABLET | Freq: Four times a day (QID) | ORAL | 0 refills | Status: DC | PRN
  Filled 2023-10-11: qty 120, 30d supply, fill #0

## 2023-10-25 ENCOUNTER — Other Ambulatory Visit: Payer: Self-pay | Admitting: Family Medicine

## 2023-10-25 DIAGNOSIS — J449 Chronic obstructive pulmonary disease, unspecified: Secondary | ICD-10-CM

## 2023-10-27 ENCOUNTER — Other Ambulatory Visit: Payer: Self-pay | Admitting: Family Medicine

## 2023-10-27 DIAGNOSIS — I10 Essential (primary) hypertension: Secondary | ICD-10-CM

## 2023-11-04 DIAGNOSIS — M47816 Spondylosis without myelopathy or radiculopathy, lumbar region: Secondary | ICD-10-CM | POA: Diagnosis not present

## 2023-11-04 DIAGNOSIS — Z79891 Long term (current) use of opiate analgesic: Secondary | ICD-10-CM | POA: Diagnosis not present

## 2023-11-04 DIAGNOSIS — G894 Chronic pain syndrome: Secondary | ICD-10-CM | POA: Diagnosis not present

## 2023-11-07 ENCOUNTER — Other Ambulatory Visit (HOSPITAL_BASED_OUTPATIENT_CLINIC_OR_DEPARTMENT_OTHER): Payer: Self-pay

## 2023-11-07 MED ORDER — OXYCODONE HCL 10 MG PO TABS
10.0000 mg | ORAL_TABLET | Freq: Four times a day (QID) | ORAL | 0 refills | Status: AC | PRN
  Filled 2023-11-09: qty 120, 30d supply, fill #0

## 2023-11-09 ENCOUNTER — Other Ambulatory Visit (HOSPITAL_BASED_OUTPATIENT_CLINIC_OR_DEPARTMENT_OTHER): Payer: Self-pay

## 2023-11-14 DIAGNOSIS — Z951 Presence of aortocoronary bypass graft: Secondary | ICD-10-CM | POA: Diagnosis not present

## 2023-11-14 DIAGNOSIS — I6523 Occlusion and stenosis of bilateral carotid arteries: Secondary | ICD-10-CM | POA: Diagnosis not present

## 2023-11-14 DIAGNOSIS — R42 Dizziness and giddiness: Secondary | ICD-10-CM | POA: Diagnosis not present

## 2023-11-14 DIAGNOSIS — I10 Essential (primary) hypertension: Secondary | ICD-10-CM | POA: Diagnosis not present

## 2023-11-14 DIAGNOSIS — R001 Bradycardia, unspecified: Secondary | ICD-10-CM | POA: Diagnosis not present

## 2023-11-14 DIAGNOSIS — F17200 Nicotine dependence, unspecified, uncomplicated: Secondary | ICD-10-CM | POA: Diagnosis not present

## 2023-11-14 DIAGNOSIS — I2510 Atherosclerotic heart disease of native coronary artery without angina pectoris: Secondary | ICD-10-CM | POA: Diagnosis not present

## 2023-11-14 DIAGNOSIS — E785 Hyperlipidemia, unspecified: Secondary | ICD-10-CM | POA: Diagnosis not present

## 2023-11-14 DIAGNOSIS — R7989 Other specified abnormal findings of blood chemistry: Secondary | ICD-10-CM | POA: Diagnosis not present

## 2023-11-15 ENCOUNTER — Other Ambulatory Visit: Payer: Self-pay | Admitting: Family Medicine

## 2023-11-16 ENCOUNTER — Ambulatory Visit: Payer: Medicare HMO | Admitting: Family Medicine

## 2023-11-24 ENCOUNTER — Encounter: Payer: Self-pay | Admitting: Family Medicine

## 2023-11-24 ENCOUNTER — Ambulatory Visit (INDEPENDENT_AMBULATORY_CARE_PROVIDER_SITE_OTHER): Admitting: Family Medicine

## 2023-11-24 VITALS — BP 130/64 | HR 52 | Temp 96.9°F | Resp 14 | Ht 64.0 in | Wt 99.0 lb

## 2023-11-24 DIAGNOSIS — I10 Essential (primary) hypertension: Secondary | ICD-10-CM | POA: Diagnosis not present

## 2023-11-24 DIAGNOSIS — E782 Mixed hyperlipidemia: Secondary | ICD-10-CM | POA: Diagnosis not present

## 2023-11-24 DIAGNOSIS — G4489 Other headache syndrome: Secondary | ICD-10-CM | POA: Diagnosis not present

## 2023-11-24 DIAGNOSIS — M810 Age-related osteoporosis without current pathological fracture: Secondary | ICD-10-CM

## 2023-11-24 DIAGNOSIS — N951 Menopausal and female climacteric states: Secondary | ICD-10-CM | POA: Diagnosis not present

## 2023-11-24 DIAGNOSIS — F17200 Nicotine dependence, unspecified, uncomplicated: Secondary | ICD-10-CM | POA: Diagnosis not present

## 2023-11-24 DIAGNOSIS — J449 Chronic obstructive pulmonary disease, unspecified: Secondary | ICD-10-CM

## 2023-11-24 MED ORDER — BREZTRI AEROSPHERE 160-9-4.8 MCG/ACT IN AERO
2.0000 | INHALATION_SPRAY | Freq: Two times a day (BID) | RESPIRATORY_TRACT | 2 refills | Status: DC
Start: 2023-11-24 — End: 2024-02-10

## 2023-11-24 MED ORDER — UBRELVY 100 MG PO TABS: ORAL_TABLET | ORAL | 3 refills | Status: DC

## 2023-11-24 MED ORDER — VENLAFAXINE HCL ER 37.5 MG PO CP24: 37.5000 mg | ORAL_CAPSULE | Freq: Every day | ORAL | 2 refills | Status: AC

## 2023-11-24 NOTE — Patient Instructions (Signed)
 VISIT SUMMARY:  Today, we reviewed your blood pressure management, discussed your chest pain and shortness of breath, addressed your severe night sweats, and talked about your efforts to reduce smoking. We also reviewed your current treatments for COPD, high cholesterol, and osteoporosis.  YOUR PLAN:  ESSENTIAL HYPERTENSION: Your blood pressure has improved but still needs monitoring. -Recheck your blood pressure before making any medication changes. -Continue keeping a blood pressure log for your cardiologist to review. -Prepare for your nuclear stress test on August 18.  TOBACCO USE: You are working on reducing your smoking, which is important for your heart health. -Continue focusing on reducing your tobacco use. -We encourage you to quit smoking entirely.  VASOMOTOR SYMPTOMS: You are experiencing severe night sweats and hot flashes. -We will prescribe venlafaxine  to help manage these symptoms. -Take venlafaxine  in the morning. - PLEASE LET ME KNOW IF THIS IS HELPING IN ABOUT 3 WEEKS. IF NOT HELPING, WOULD CONSIDER ALTERNATIVE TREATMENT, VEOZAH.   CHRONIC OBSTRUCTIVE PULMONARY DISEASE (COPD): Your breathing is well-managed with your current treatment. -Continue using Breztri  as prescribed.  HYPERLIPIDEMIA: Your high cholesterol is being managed with medication. -Continue taking rosuvastatin  5 mg daily. -Continue taking fenofibrate  160 mg daily.  OSTEOPOROSIS: Your osteoporosis is being managed with Prolia  injections. -Continue receiving Prolia  injections every six months.
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
## 2023-11-26 DIAGNOSIS — N951 Menopausal and female climacteric states: Secondary | ICD-10-CM | POA: Insufficient documentation

## 2023-11-26 NOTE — Assessment & Plan Note (Addendum)
 Blood pressure improving but still requires monitoring. Cardiologist requested a blood pressure log and ordered a nuclear stress test. - Recheck blood pressure before medication changes. - Continue blood pressure log for cardiologist review. - Prepare for nuclear stress test on August 18.

## 2023-11-26 NOTE — Assessment & Plan Note (Signed)
 Severe night sweats and hot flashes. Glycopyrrolate ineffective. Considering venlafaxine  for cost and insurance coverage. - Prescribe venlafaxine  for vasomotor symptoms. - Advise taking venlafaxine  in the morning.

## 2023-11-26 NOTE — Assessment & Plan Note (Signed)
 Breathing well-managed with current regimen. No recent exacerbations. - Continue Breztri  for COPD management.

## 2023-11-26 NOTE — Assessment & Plan Note (Addendum)
 Cholesterol at goal.  Managed with rosuvastatin  and fenofibrate . - Continue rosuvastatin  5 mg daily. - Continue fenofibrate  160 mg daily.

## 2023-11-26 NOTE — Assessment & Plan Note (Signed)
 Managed with Prolia  injections. - Continue Prolia  injections every six months.

## 2023-11-26 NOTE — Assessment & Plan Note (Signed)
 Cardiology advised smoking cessation as a health priority. She is attempting to reduce use. - Focus on reducing tobacco use. - Encourage smoking cessation.

## 2023-12-05 DIAGNOSIS — I340 Nonrheumatic mitral (valve) insufficiency: Secondary | ICD-10-CM | POA: Diagnosis not present

## 2023-12-06 ENCOUNTER — Telehealth: Payer: Self-pay

## 2023-12-06 NOTE — Telephone Encounter (Unsigned)
 Copied from CRM #[id number]. Topic: Clinical - Prescription Issue >> Dec 06, 2023  9:30 AM Pinkey ORN wrote: Reason for CRM: venlafaxine  XR (EFFEXOR -XR) 37.5 MG 24 hr capsule

## 2023-12-06 NOTE — Telephone Encounter (Signed)
 Spoke with [facility] who stated rx was filled 8/7 without issues.

## 2023-12-13 ENCOUNTER — Other Ambulatory Visit: Payer: Self-pay | Admitting: Family Medicine

## 2023-12-13 DIAGNOSIS — E782 Mixed hyperlipidemia: Secondary | ICD-10-CM

## 2023-12-13 DIAGNOSIS — M47816 Spondylosis without myelopathy or radiculopathy, lumbar region: Secondary | ICD-10-CM | POA: Diagnosis not present

## 2024-01-10 ENCOUNTER — Other Ambulatory Visit: Payer: Self-pay | Admitting: Family Medicine

## 2024-01-10 DIAGNOSIS — G4489 Other headache syndrome: Secondary | ICD-10-CM

## 2024-01-16 ENCOUNTER — Telehealth: Payer: Self-pay | Admitting: Family Medicine

## 2024-01-16 NOTE — Telephone Encounter (Unsigned)
 Copied from CRM #[id number]. Topic: Clinical - Prescription Issue >> Jan 16, 2024  4:10 PM Sophia H wrote: Reason for CRM: Spoke with [facility] pharmacy - Needing clarification on Ubrogepant  (UBRELVY ) 100 MG TABS - need dto know max daily dose and missing documentation of number of migraines the patient has per month. This is needed so that the claim will justify the quantity once submitted. Ty    Phone: [phone] Fax: [phone]

## 2024-01-18 ENCOUNTER — Other Ambulatory Visit: Payer: Self-pay | Admitting: Family Medicine

## 2024-01-18 NOTE — Telephone Encounter (Signed)
 Patient may take up to 200 mg ubrelvy  daily.  Please call her and find out how many migraines she has per month.  Dr. Sherre

## 2024-01-19 ENCOUNTER — Other Ambulatory Visit: Payer: Self-pay

## 2024-01-19 DIAGNOSIS — G4489 Other headache syndrome: Secondary | ICD-10-CM

## 2024-01-19 MED ORDER — UBRELVY 100 MG PO TABS: ORAL_TABLET | ORAL | 3 refills | Status: AC

## 2024-01-19 NOTE — Telephone Encounter (Signed)
 Left message for patient to call us back.

## 2024-01-19 NOTE — Telephone Encounter (Signed)
 8 per month per patient.

## 2024-01-20 DIAGNOSIS — M47816 Spondylosis without myelopathy or radiculopathy, lumbar region: Secondary | ICD-10-CM | POA: Diagnosis not present

## 2024-01-23 DIAGNOSIS — Z23 Encounter for immunization: Secondary | ICD-10-CM | POA: Diagnosis not present

## 2024-02-06 ENCOUNTER — Other Ambulatory Visit: Payer: Self-pay | Admitting: Family Medicine

## 2024-02-06 DIAGNOSIS — I10 Essential (primary) hypertension: Secondary | ICD-10-CM

## 2024-02-06 NOTE — Telephone Encounter (Signed)
 Copied from CRM [id number]. Topic: Clinical - Medication Refill >> Feb 06, 2024  8:30 AM Tiffini S wrote: Medication: lisinopril  (ZESTRIL ) 20 MG tablet  Has the patient contacted their pharmacy? Yes (Agent: If no, request that the patient contact the pharmacy for the refill. If patient does not wish to contact the pharmacy document the reason why and proceed with request.) (Agent: If yes, when and what did the pharmacy advise?)  This is the patient's preferred pharmacy:  [facility] - [place], [place] - [address] [address] Phone: [phone] Fax: [phone]   Is this the correct pharmacy for this prescription? Yes If no, delete pharmacy and type the correct one.   Has the prescription been filled recently? Yes  Is the patient out of the medication? Yes  Has the patient been seen for an appointment in the last year OR does the patient have an upcoming appointment? Yes  Can we respond through MyChart? Yes  Agent: Please be advised that Rx refills may take up to 3 business days. We ask that you follow-up with your pharmacy.

## 2024-02-09 ENCOUNTER — Other Ambulatory Visit: Payer: Self-pay | Admitting: Family Medicine

## 2024-02-10 ENCOUNTER — Other Ambulatory Visit: Payer: Self-pay | Admitting: Family Medicine

## 2024-02-10 DIAGNOSIS — J449 Chronic obstructive pulmonary disease, unspecified: Secondary | ICD-10-CM

## 2024-02-15 ENCOUNTER — Other Ambulatory Visit: Payer: Self-pay

## 2024-02-16 ENCOUNTER — Other Ambulatory Visit: Payer: Self-pay

## 2024-02-16 ENCOUNTER — Telehealth: Payer: Self-pay

## 2024-02-16 DIAGNOSIS — M810 Age-related osteoporosis without current pathological fracture: Secondary | ICD-10-CM

## 2024-02-16 MED ORDER — DENOSUMAB 60 MG/ML ~~LOC~~ SOSY: 60.0000 mg | PREFILLED_SYRINGE | Freq: Once | SUBCUTANEOUS | 0 refills | Status: DC

## 2024-02-16 MED ORDER — DENOSUMAB 60 MG/ML ~~LOC~~ SOSY: 60.0000 mg | PREFILLED_SYRINGE | Freq: Once | SUBCUTANEOUS | 0 refills | Status: AC

## 2024-02-16 NOTE — Telephone Encounter (Signed)
 Dorothy Parker

## 2024-02-27 ENCOUNTER — Ambulatory Visit (INDEPENDENT_AMBULATORY_CARE_PROVIDER_SITE_OTHER)

## 2024-02-27 DIAGNOSIS — M810 Age-related osteoporosis without current pathological fracture: Secondary | ICD-10-CM | POA: Diagnosis not present

## 2024-02-27 NOTE — Progress Notes (Signed)
   Patient: Dorothy Parker  DOB: [birth year]  MRN: [id number]    Visit Date: 02/27/2024    AVIRA TILLISON presents today for her six month Prolia  injection.  1 x 60 mg Single-Dose Prefilled Syringe was given SQ in the right arm.  Patient tolerated the injection well and has no questions.  The next Prolia  injection will be due in six months.      Laney LITTIE Ferries, CMA

## 2024-03-08 ENCOUNTER — Other Ambulatory Visit: Payer: Self-pay | Admitting: Family Medicine

## 2024-03-08 DIAGNOSIS — E782 Mixed hyperlipidemia: Secondary | ICD-10-CM

## 2024-04-05 ENCOUNTER — Telehealth: Payer: Self-pay

## 2024-04-05 NOTE — Telephone Encounter (Signed)
 Per documentation from [facility] Ubrelvy  is non-formulary. Per patient, in January patient is changing back over to [facility]. Aware to ask her insurance provider which migraine medications are formulary. States she has only tried Ubrelvy  for migraines and currently takes it every 3 days.

## 2024-04-18 ENCOUNTER — Encounter: Payer: Self-pay | Admitting: Family Medicine

## 2024-05-04 ENCOUNTER — Other Ambulatory Visit: Payer: Self-pay | Admitting: Family Medicine

## 2024-05-04 DIAGNOSIS — J449 Chronic obstructive pulmonary disease, unspecified: Secondary | ICD-10-CM

## 2024-05-07 ENCOUNTER — Other Ambulatory Visit: Payer: Self-pay | Admitting: Family Medicine

## 2024-05-07 DIAGNOSIS — I10 Essential (primary) hypertension: Secondary | ICD-10-CM

## 2024-05-08 ENCOUNTER — Other Ambulatory Visit: Payer: Self-pay | Admitting: Family Medicine

## 2024-05-09 ENCOUNTER — Telehealth: Payer: Self-pay

## 2024-05-09 ENCOUNTER — Encounter: Payer: Self-pay | Admitting: *Deleted

## 2024-05-09 NOTE — Telephone Encounter (Signed)
 Auth Submission: APPROVED Site of care: Site of care: [facility] [place] Payer: [employer] medicare Medication & CPT/J Code(s) submitted: Prolia  (Denosumab ) [id number] Diagnosis Code:  Route of submission (phone, fax, portal): portal Phone # Fax # Auth type: Buy/Bill PB Units/visits requested: 60mg  q61months x 2doses Reference number: [id number] Approval from: 05/08/24 to 04/18/25

## 2024-05-18 ENCOUNTER — Telehealth: Payer: Self-pay | Admitting: Family Medicine

## 2024-05-28 ENCOUNTER — Ambulatory Visit: Admitting: Family Medicine

## 2024-05-31 ENCOUNTER — Ambulatory Visit: Admitting: Family Medicine

## 2024-05-31 DIAGNOSIS — E782 Mixed hyperlipidemia: Secondary | ICD-10-CM

## 2024-08-27 ENCOUNTER — Ambulatory Visit
